# Patient Record
Sex: Female | Born: 1955 | Race: White | Hispanic: No | Marital: Married | Smoking: Never smoker
Health system: Southern US, Community
[De-identification: ages and names within clinical notes are randomized; demographics above are authoritative.]

## PROBLEM LIST (undated history)

## (undated) DIAGNOSIS — F32A Depression, unspecified: Secondary | ICD-10-CM

## (undated) DIAGNOSIS — K219 Gastro-esophageal reflux disease without esophagitis: Secondary | ICD-10-CM

## (undated) DIAGNOSIS — I10 Essential (primary) hypertension: Secondary | ICD-10-CM

## (undated) DIAGNOSIS — J189 Pneumonia, unspecified organism: Secondary | ICD-10-CM

## (undated) DIAGNOSIS — F419 Anxiety disorder, unspecified: Secondary | ICD-10-CM

## (undated) DIAGNOSIS — D649 Anemia, unspecified: Secondary | ICD-10-CM

## (undated) DIAGNOSIS — I639 Cerebral infarction, unspecified: Secondary | ICD-10-CM

## (undated) DIAGNOSIS — C801 Malignant (primary) neoplasm, unspecified: Secondary | ICD-10-CM

## (undated) DIAGNOSIS — R519 Headache, unspecified: Secondary | ICD-10-CM

## (undated) DIAGNOSIS — M1990 Unspecified osteoarthritis, unspecified site: Secondary | ICD-10-CM

## (undated) DIAGNOSIS — R0600 Dyspnea, unspecified: Secondary | ICD-10-CM

## (undated) DIAGNOSIS — I447 Left bundle-branch block, unspecified: Secondary | ICD-10-CM

## (undated) DIAGNOSIS — I209 Angina pectoris, unspecified: Secondary | ICD-10-CM

## (undated) HISTORY — PX: DILATION AND CURETTAGE OF UTERUS: SHX78

## (undated) HISTORY — PX: APPENDECTOMY: SHX54

## (undated) HISTORY — PX: CHOLECYSTECTOMY: SHX55

## (undated) HISTORY — PX: TONSILLECTOMY: SUR1361

## (undated) HISTORY — PX: OTHER SURGICAL HISTORY: SHX169

## (undated) HISTORY — PX: ABDOMINAL HYSTERECTOMY: SHX81

---

## 2015-05-09 ENCOUNTER — Other Ambulatory Visit: Payer: Self-pay

## 2015-05-10 LAB — CBC WITH DIFFERENTIAL/PLATELET
Basophils Absolute: 0 10*3/uL (ref 0.0–0.2)
Basos: 1 %
EOS (ABSOLUTE): 0.1 10*3/uL (ref 0.0–0.4)
EOS: 1 %
HEMATOCRIT: 44.6 % (ref 34.0–46.6)
HEMOGLOBIN: 14.7 g/dL (ref 11.1–15.9)
Immature Grans (Abs): 0 10*3/uL (ref 0.0–0.1)
Immature Granulocytes: 0 %
LYMPHS ABS: 2.8 10*3/uL (ref 0.7–3.1)
Lymphs: 38 %
MCH: 30.3 pg (ref 26.6–33.0)
MCHC: 33 g/dL (ref 31.5–35.7)
MCV: 92 fL (ref 79–97)
MONOCYTES: 7 %
MONOS ABS: 0.5 10*3/uL (ref 0.1–0.9)
NEUTROS ABS: 4 10*3/uL (ref 1.4–7.0)
Neutrophils: 53 %
Platelets: 193 10*3/uL (ref 150–379)
RBC: 4.85 x10E6/uL (ref 3.77–5.28)
RDW: 13.9 % (ref 12.3–15.4)
WBC: 7.4 10*3/uL (ref 3.4–10.8)

## 2015-05-10 LAB — COMPREHENSIVE METABOLIC PANEL
A/G RATIO: 1.9 (ref 1.1–2.5)
ALBUMIN: 4.2 g/dL (ref 3.5–5.5)
ALK PHOS: 69 IU/L (ref 39–117)
ALT: 20 IU/L (ref 0–32)
AST: 22 IU/L (ref 0–40)
BILIRUBIN TOTAL: 0.4 mg/dL (ref 0.0–1.2)
BUN / CREAT RATIO: 15 (ref 9–23)
BUN: 17 mg/dL (ref 6–24)
CHLORIDE: 103 mmol/L (ref 97–108)
CO2: 23 mmol/L (ref 18–29)
CREATININE: 1.13 mg/dL — AB (ref 0.57–1.00)
Calcium: 9.5 mg/dL (ref 8.7–10.2)
GFR calc Af Amer: 62 mL/min/{1.73_m2} (ref >59)
GFR calc non Af Amer: 54 mL/min/{1.73_m2} — ABNORMAL LOW (ref >59)
GLOBULIN, TOTAL: 2.2 g/dL (ref 1.5–4.5)
Glucose: 93 mg/dL (ref 65–99)
POTASSIUM: 4.6 mmol/L (ref 3.5–5.2)
SODIUM: 140 mmol/L (ref 134–144)
Total Protein: 6.4 g/dL (ref 6.0–8.5)

## 2015-05-10 LAB — TSH: TSH: 3.37 u[IU]/mL (ref 0.450–4.500)

## 2015-07-06 ENCOUNTER — Ambulatory Visit
Admission: EM | Admit: 2015-07-06 | Discharge: 2015-07-06 | Disposition: A | Discharge status: Home / Self Care | Payer: Self-pay | Attending: Family Medicine | Admitting: Family Medicine

## 2015-07-06 ENCOUNTER — Encounter: Payer: Self-pay | Admitting: Emergency Medicine

## 2015-07-06 DIAGNOSIS — J0100 Acute maxillary sinusitis, unspecified: Secondary | ICD-10-CM

## 2015-07-06 DIAGNOSIS — J40 Bronchitis, not specified as acute or chronic: Secondary | ICD-10-CM

## 2015-07-06 HISTORY — DX: Essential (primary) hypertension: I10

## 2015-07-06 MED ORDER — GUAIFENESIN-CODEINE 100-10 MG/5ML PO SOLN: 10.0000 mL | Freq: Four times a day (QID) | ORAL | Status: DC | PRN

## 2015-07-06 MED ORDER — LEVOFLOXACIN 500 MG PO TABS: 500.0000 mg | ORAL_TABLET | Freq: Every day | ORAL | Status: DC

## 2015-07-06 NOTE — ED Provider Notes (Signed)
CSN: [id number]     Arrival date & time 07/06/15  1228 History   First MD Initiated Contact with Patient 07/06/15 1436     Chief Complaint  Patient presents with  . Influenza   (Consider location/radiation/quality/duration/timing/severity/associated sxs/prior Treatment) HPI Comments: 59 yo female with a  1 month h/o sinus congestion, sinus headaches, intermittent fevers, chills, productive cough, fatigue. States her husband was diagnosed last week with pneumonia. Denies wheezing, chest pain or shortness of breath.   The history is provided by the patient.    Past Medical History  Diagnosis Date  . Hypertension    History reviewed. No pertinent past surgical history. History reviewed. No pertinent family history. Social History  Substance Use Topics  . Smoking status: Never Smoker   . Smokeless tobacco: None  . Alcohol Use: No   OB History    No data available     Review of Systems  Allergies  Penicillins  Home Medications   Prior to Admission medications   Medication Sig Start Date End Date Taking? Authorizing Provider  metoprolol succinate (TOPROL-XL) 100 MG 24 hr tablet Take 100 mg by mouth daily. Take with or immediately following a meal.   Yes Historical Provider, MD  guaiFENesin-codeine 100-10 MG/5ML syrup Take 10 mLs by mouth every 6 (six) hours as needed for cough. 07/06/15   Norval Gable, MD  levofloxacin (LEVAQUIN) 500 MG tablet Take 1 tablet (500 mg total) by mouth daily. 07/06/15   Norval Gable, MD   Meds Ordered and Administered this Visit  Medications - No data to display  BP 130/80 mmHg  Pulse 65  Temp(Src) 98.5 F (36.9 C) (Tympanic)  Resp 18  Ht 5\' 4"  (1.626 m)  Wt 185 lb (83.915 kg)  BMI 31.74 kg/m2  SpO2 96% No data found.   Physical Exam  Constitutional: She appears well-developed and well-nourished. No distress.  HENT:  Head: Normocephalic and atraumatic.  Right Ear: Tympanic membrane, external ear and ear canal normal.  Left Ear:  Tympanic membrane, external ear and ear canal normal.  Nose: Mucosal edema and rhinorrhea present. No nose lacerations, sinus tenderness, nasal deformity, septal deviation or nasal septal hematoma. No epistaxis.  No foreign bodies. Right sinus exhibits maxillary sinus tenderness and frontal sinus tenderness. Left sinus exhibits maxillary sinus tenderness and frontal sinus tenderness.  Mouth/Throat: Uvula is midline, oropharynx is clear and moist and mucous membranes are normal. No oropharyngeal exudate.  Eyes: Conjunctivae and EOM are normal. Pupils are equal, round, and reactive to light. Right eye exhibits no discharge. Left eye exhibits no discharge. No scleral icterus.  Neck: Normal range of motion. Neck supple. No thyromegaly present.  Cardiovascular: Normal rate, regular rhythm and normal heart sounds.   Pulmonary/Chest: Effort normal. No respiratory distress. She has no wheezes. She has rales (right base).  Lymphadenopathy:    She has no cervical adenopathy.  Skin: No rash noted. She is not diaphoretic.  Nursing note and vitals reviewed.   ED Course  Procedures (including critical care time)  Labs Review Labs Reviewed - No data to display  Imaging Review No results found.   Visual Acuity Review  Right Eye Distance:   Left Eye Distance:   Bilateral Distance:    Right Eye Near:   Left Eye Near:    Bilateral Near:         MDM   1. Acute maxillary sinusitis, recurrence not specified   2. Bronchitis   (vs possible early/mild pneumonia ?)  Discharge Medication List  as of 07/06/2015  2:47 PM    START taking these medications   Details  guaiFENesin-codeine 100-10 MG/5ML syrup Take 10 mLs by mouth every 6 (six) hours as needed for cough., Starting 07/06/2015, Until Discontinued, Print    levofloxacin (LEVAQUIN) 500 MG tablet Take 1 tablet (500 mg total) by mouth daily., Starting 07/06/2015, Until Discontinued, Normal      1. diagnosis reviewed with  patient/parent/guardian/family 2. rx as per orders above; reviewed possible side effects, interactions, risks and benefits  3. Recommend supportive treatment with increased fluids 4. Follow-up  prn if symptoms worsen or don't improve    Norval Gable, MD 07/06/15 1507

## 2015-07-06 NOTE — ED Notes (Signed)
Pt with flu like s/s x 1 month

## 2019-08-28 DIAGNOSIS — U071 COVID-19: Secondary | ICD-10-CM

## 2019-08-28 HISTORY — DX: COVID-19: U07.1

## 2020-01-06 ENCOUNTER — Ambulatory Visit
Admission: RE | Admit: 2020-01-06 | Discharge: 2020-01-06 | Disposition: A | Discharge status: Home / Self Care | Payer: Self-pay | Source: Ambulatory Visit | Attending: Family Medicine | Admitting: Family Medicine

## 2020-01-06 ENCOUNTER — Other Ambulatory Visit: Payer: Self-pay | Admitting: Family Medicine

## 2020-01-06 ENCOUNTER — Other Ambulatory Visit: Payer: Self-pay

## 2020-01-06 DIAGNOSIS — R221 Localized swelling, mass and lump, neck: Secondary | ICD-10-CM

## 2020-01-06 LAB — POCT I-STAT CREATININE: Creatinine, Ser: 1.2 mg/dL — ABNORMAL HIGH (ref 0.44–1.00)

## 2020-01-06 IMAGING — CT CT NECK W/ CM
4 of 5 series · 14 of 33 positions shown, 16 images · IV contrast (omnipaque)
Comparison: Report from head CT [DATE] (images unavailable).

CLINICAL DATA: Complaint of right-sided neck mass known for 2
weeks, patient denies any pain or other symptoms, denies fever.

EXAM:
CT NECK WITH CONTRAST
TECHNIQUE: Multidetector CT imaging of the neck was performed using the
standard protocol following the bolus administration of intravenous
contrast.
CONTRAST:  75mL OMNIPAQUE IOHEXOL 300 MG/ML  SOLN

[Series 2: axial neck neck (person_name) 2.00 · axial · 0.44mm/px · z∈[-699,-595]mm · 3 of 106 slices shown]
[im 27/106  bone]
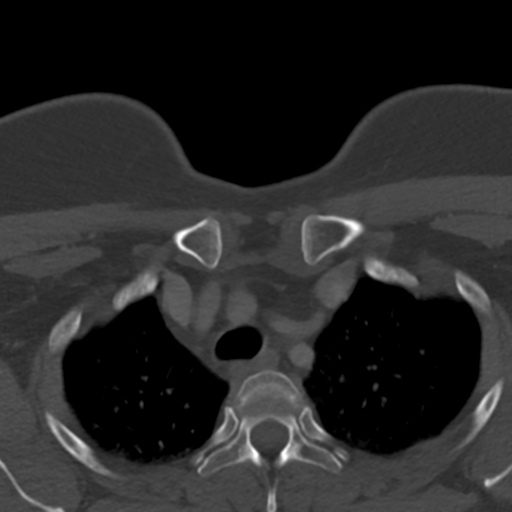
[im 53/106  bone]
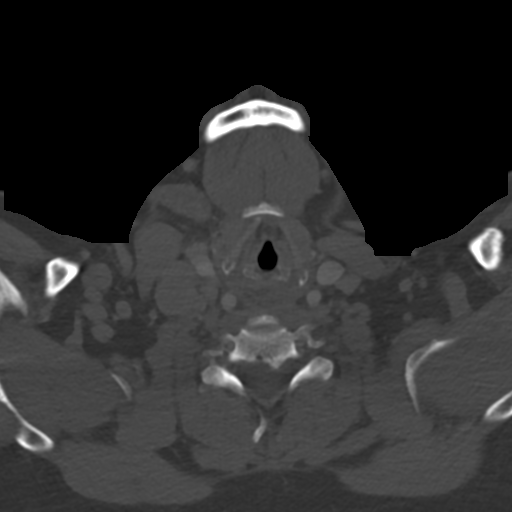
[im 79/106  bone]
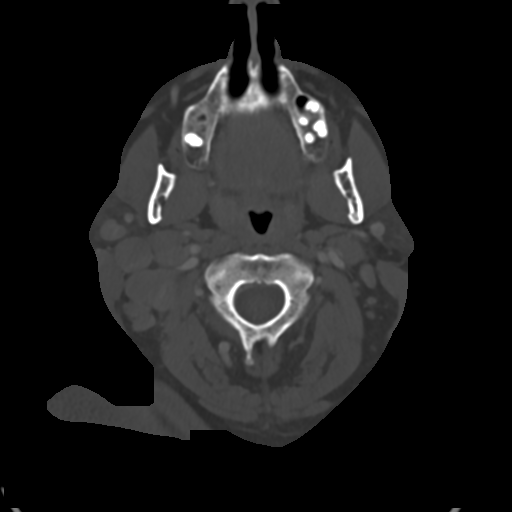

[Series 4: coronal neck neck (person_name) 2.00 cor · coronal · 0.38mm/px · 3 of 112 slices shown]
[im 23/112  bone]
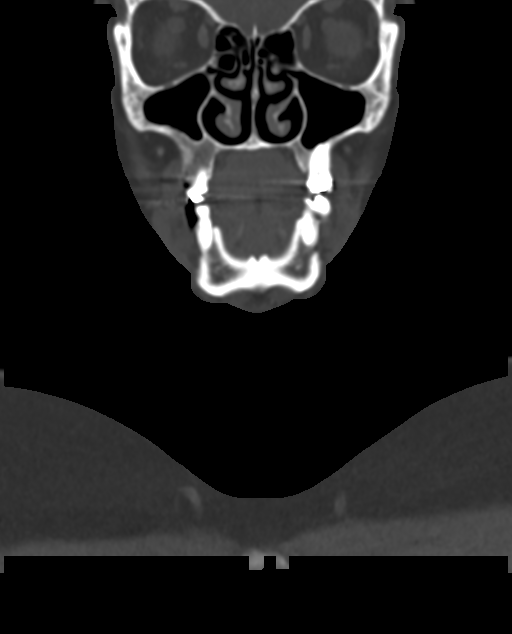
[im 45/112  bone]
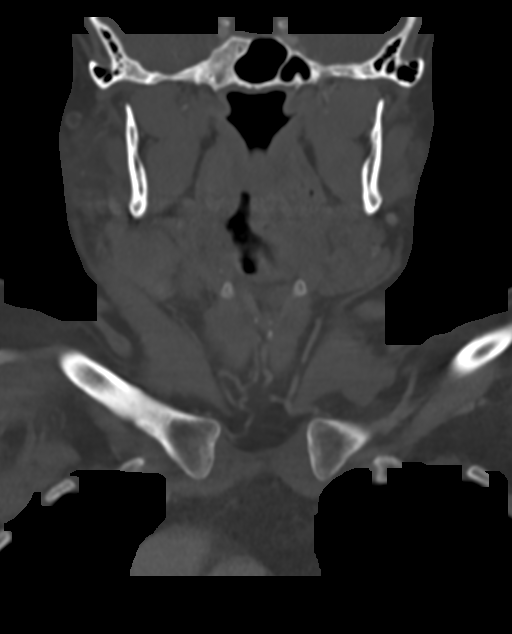
[im 67/112  bone]
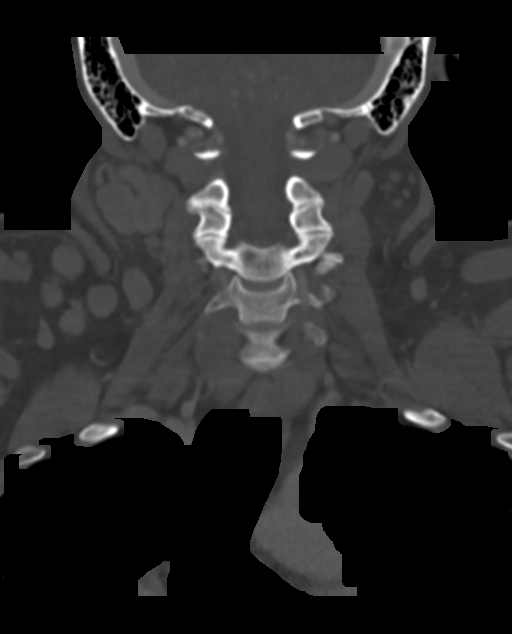

[Series 6: sagittal neck neck (person_name) 2.00 sag · sagittal · 0.44mm/px · 5 of 96 slices shown, 6 images]
[im 32/96  bone]
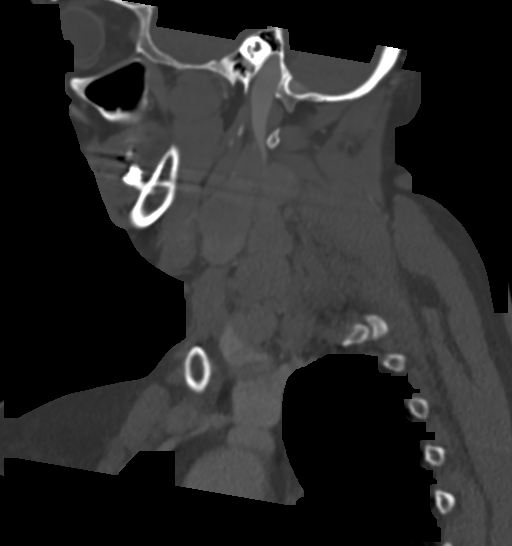
[im 40/96  bone]
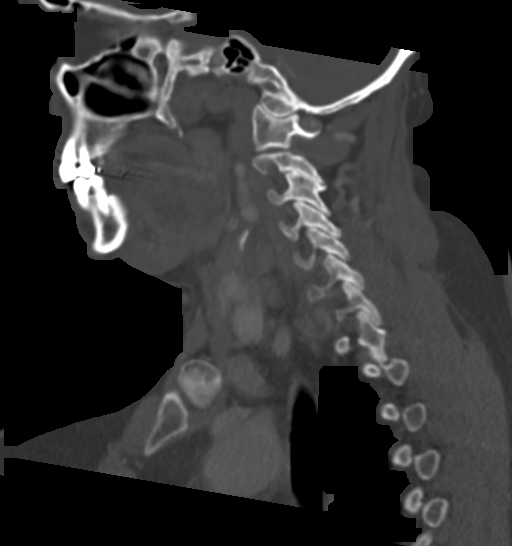
[im 48/96  soft-tissue]
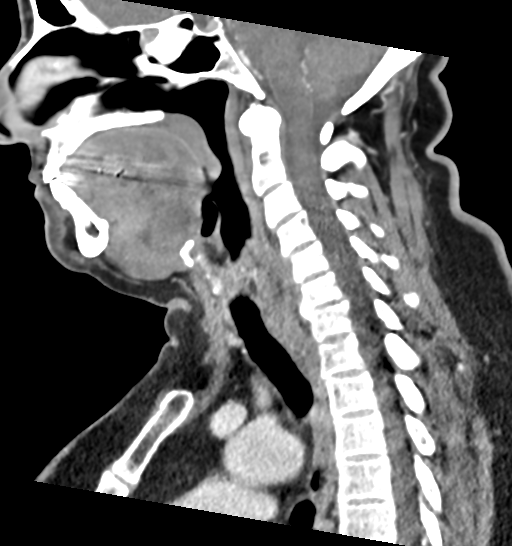
[im 48/96  bone]
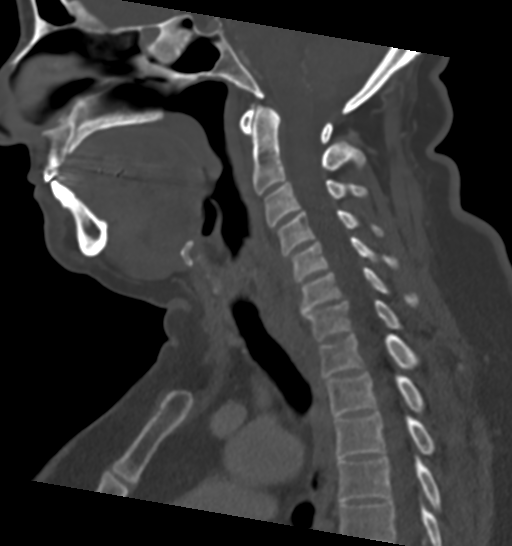
[im 56/96  bone]
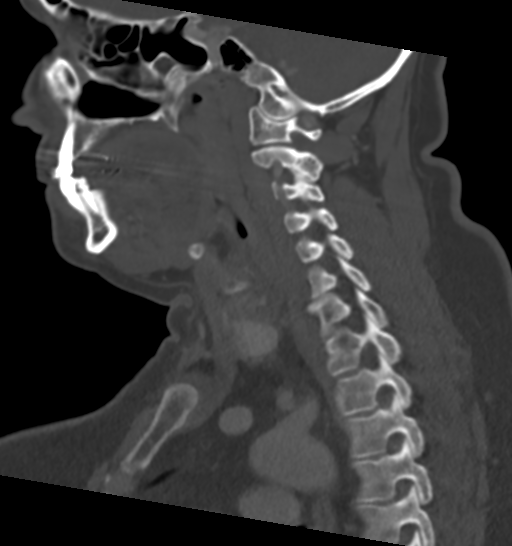
[im 64/96  bone]
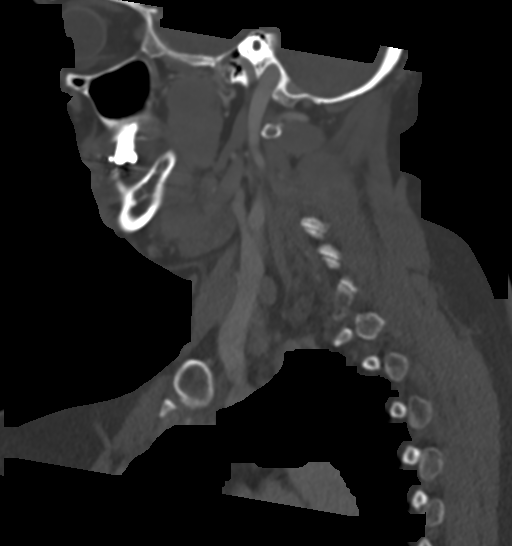

[Series 8: ax oropharynx neck neck (person_name) 2.00 ax · axial · 0.38mm/px · z∈[-724,-608]mm · 3 of 119 slices shown, 4 images]
[im 30/119  soft-tissue]
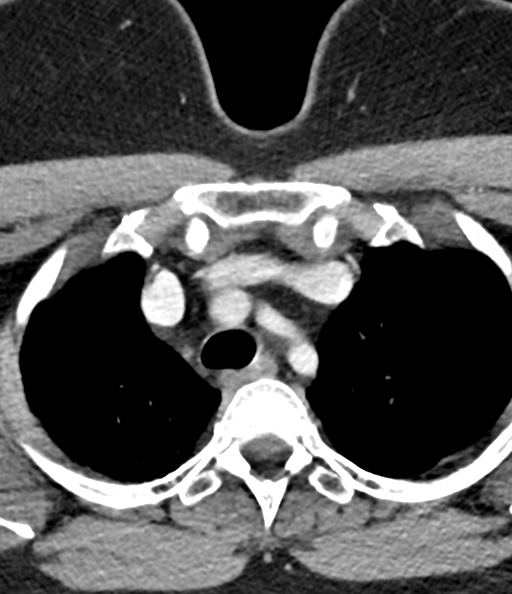
[im 30/119  bone]
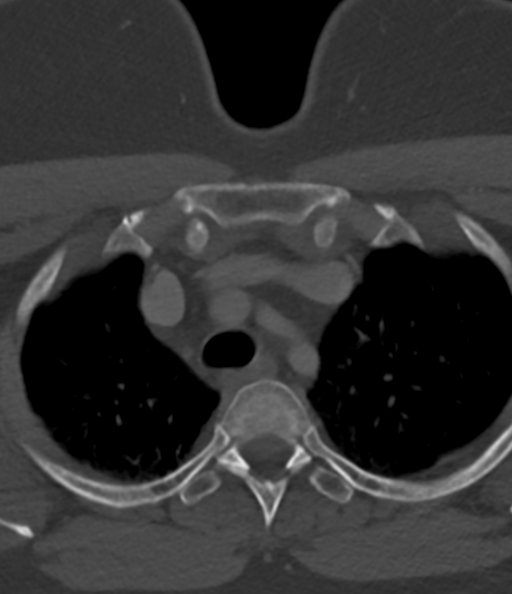
[im 60/119  bone]
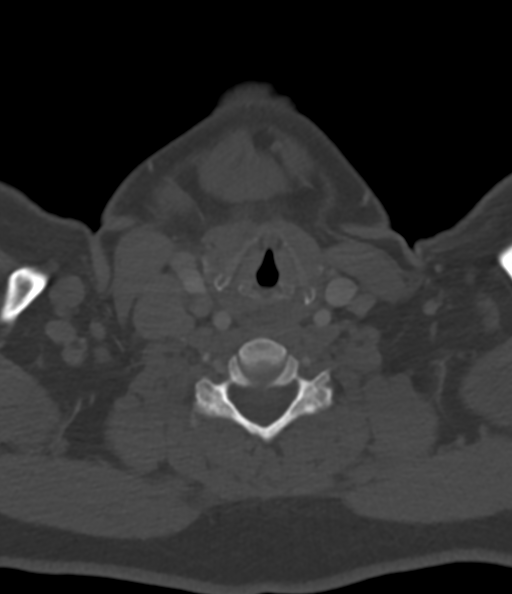
[im 89/119  bone]
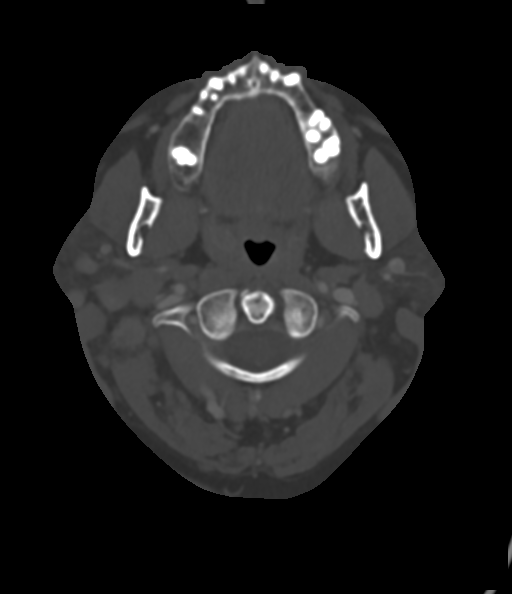

[14 of 33 positions shown; findings below may reference images not displayed]

FINDINGS: Pharynx and larynx: No discrete mass is identified within the oral
cavity, pharynx or larynx.

Salivary glands: No inflammation, mass, or stone. The right
submandibular gland is anteriorly displaced by left level II
lymphadenopathy.

Thyroid: Unremarkable.

Lymph nodes: There is prominent bilateral cervical chain
lymphadenopathy at essentially all nodal stations. Most notably,
there is bulky matted lymphadenopathy at the right level II and III
stations. An index right level II lymph node measures 3.8 x 2.5 cm
in transaxial dimensions (series 6, image 31) (series 2, image 41).

Vascular: The internal jugular veins are partially effaced by
prominent bilateral cervical lymphadenopathy.

Limited intracranial: Partially empty sella turcica. Otherwise
unremarkable.

Visualized orbits: Visualized orbits show no acute finding.

Mastoids and visualized paranasal sinuses: Complete opacification of
the right sphenoid sinus. No significant mastoid effusion.

Skeleton: No acute bony abnormality or aggressive osseous lesion.
Cervical spondylosis. Most notably at C6-C7 there is
moderate/advanced disc space narrowing with a posterior disc
osteophyte complex and uncovertebral hypertrophy.

Upper chest: No consolidation within the imaged lung apices.

These results will be called to the ordering clinician or
representative by the Radiologist Assistant, and communication
documented in the PACS or [REDACTED].
IMPRESSION: Prominent bilateral cervical chain lymphadenopathy at essentially
all nodal stations. Most notably, there is bulky matted
lymphadenopathy at the right level II and III stations. An index
right level II lymph node measures 3.8 x 2.5 cm in transaxial
dimensions. Findings are highly suspicious for a lymphoproliferative
process such as lymphoma or nodal metastatic disease. Consider CT
imaging of the chest/abdomen/pelvis and/or PET-CT for further
evaluation. Additionally, consider direct tissue sampling.

No primary soft tissue neck mass is identified.

Right sphenoid sinusitis.

## 2020-01-06 MED ORDER — IOHEXOL 300 MG/ML  SOLN
75.0000 mL | Freq: Once | INTRAMUSCULAR | Status: AC | PRN
  Administered 2020-01-06: 75 mL via INTRAVENOUS

## 2020-01-08 ENCOUNTER — Other Ambulatory Visit: Payer: Self-pay

## 2020-01-11 ENCOUNTER — Encounter: Payer: Self-pay | Admitting: Internal Medicine

## 2020-01-11 ENCOUNTER — Inpatient Hospital Stay: Payer: Self-pay | Attending: Internal Medicine | Admitting: Internal Medicine

## 2020-01-11 ENCOUNTER — Inpatient Hospital Stay: Payer: Self-pay

## 2020-01-11 ENCOUNTER — Other Ambulatory Visit: Payer: Self-pay

## 2020-01-11 DIAGNOSIS — R748 Abnormal levels of other serum enzymes: Secondary | ICD-10-CM | POA: Insufficient documentation

## 2020-01-11 DIAGNOSIS — R599 Enlarged lymph nodes, unspecified: Secondary | ICD-10-CM | POA: Insufficient documentation

## 2020-01-11 DIAGNOSIS — M2578 Osteophyte, vertebrae: Secondary | ICD-10-CM | POA: Insufficient documentation

## 2020-01-11 DIAGNOSIS — R590 Localized enlarged lymph nodes: Secondary | ICD-10-CM | POA: Insufficient documentation

## 2020-01-11 DIAGNOSIS — R5383 Other fatigue: Secondary | ICD-10-CM | POA: Insufficient documentation

## 2020-01-11 DIAGNOSIS — R221 Localized swelling, mass and lump, neck: Secondary | ICD-10-CM | POA: Insufficient documentation

## 2020-01-11 DIAGNOSIS — R591 Generalized enlarged lymph nodes: Secondary | ICD-10-CM

## 2020-01-11 DIAGNOSIS — I10 Essential (primary) hypertension: Secondary | ICD-10-CM | POA: Insufficient documentation

## 2020-01-11 LAB — HEPATITIS B SURFACE ANTIGEN: Hepatitis B Surface Ag: NONREACTIVE

## 2020-01-11 LAB — LACTATE DEHYDROGENASE: LDH: 177 U/L (ref 98–192)

## 2020-01-11 LAB — HIV ANTIBODY (ROUTINE TESTING W REFLEX): HIV Screen 4th Generation wRfx: NONREACTIVE

## 2020-01-11 LAB — HEPATITIS C ANTIBODY: HCV Ab: NONREACTIVE

## 2020-01-11 NOTE — Assessment & Plan Note (Addendum)
#  Bilateral neck adenopathy right more than left; highly concerning for lymphoproliferative disorder.  Recent CBC within normal limits CMP normal except for mildly elevated alkaline phosphatase. [PCP].  #Recommend PET scan ASAP for further evaluation.  Also discussed the need for a biopsy-core biopsy versus excisional lymph node biopsy [through surgery]-once the PET scan is available.  #Recommend checking LDH; beta-2 microglobulin; hepatitis B surface antigen; HIV hepatitis C work-up.  Thank you, Ms.Fields NP for allowing me to participate in the care of your pleasant patient. Please do not hesitate to contact me with questions or concerns in the interim.  # DISPOSITION:  #Labs today #PET scan asap # follow up TBD-DrB  # I reviewed the blood work- with the patient in detail; also reviewed the imaging independently [as summarized above]; and with the patient in detail.

## 2020-01-11 NOTE — Progress Notes (Signed)
[facility] NOTE  Patient Care Team: Peggye Form, NP as PCP - General (Family Medicine)  CHIEF COMPLAINTS/PURPOSE OF CONSULTATION:   # MAY 2021- Bilateral neck adenopathy right more than left; highly concerning for lymphoproliferative disorder.MAY 2021-CBC/CMP normal /  mildly elevated alkaline phosphatase. [PCP; [facility]]. Oncology History   No history exists.     HISTORY OF PRESENTING ILLNESS:  Danielle Lewis 64 y.o.  female no signal past medical history has been referred to [place] for further work-up regarding neck adenopathy.  Patient noted to have swelling of the right neck approximately 2 weeks ago.  Denies any worsening swelling since then.  No night sweats.  No weight loss.  No fevers.  Denies any pain.  Mild to moderate fatigue.   Review of Systems  Constitutional: Positive for malaise/fatigue. Negative for chills, diaphoresis, fever and weight loss.  HENT: Negative for nosebleeds and sore throat.   Eyes: Negative for double vision.  Respiratory: Negative for cough, hemoptysis, sputum production, shortness of breath and wheezing.   Cardiovascular: Negative for chest pain, palpitations, orthopnea and leg swelling.  Gastrointestinal: Negative for abdominal pain, blood in stool, constipation, diarrhea, heartburn, melena, nausea and vomiting.  Genitourinary: Negative for dysuria, frequency and urgency.  Musculoskeletal: Negative for back pain and joint pain.  Skin: Negative.  Negative for itching and rash.  Neurological: Negative for dizziness, tingling, focal weakness, weakness and headaches.  Endo/Heme/Allergies: Does not bruise/bleed easily.  Psychiatric/Behavioral: Negative for depression. The patient is not nervous/anxious and does not have insomnia.      MEDICAL HISTORY:  Past Medical History:  Diagnosis Date  . Hypertension     SURGICAL HISTORY: History reviewed. No pertinent surgical history.  SOCIAL HISTORY: Social History   Socioeconomic  History  . Marital status: Legally Separated    Spouse name: Not on file  . Number of children: Not on file  . Years of education: Not on file  . Highest education level: Not on file  Occupational History  . Not on file  Tobacco Use  . Smoking status: Never Smoker  . Smokeless tobacco: Never Used  Substance and Sexual Activity  . Alcohol use: No  . Drug use: Not on file  . Sexual activity: Not on file  Other Topics Concern  . Not on file  Social History Narrative   Lives in [place]; [place]. Never smoked; wine rarely. Used in work in [employer]. With husband.    Social Determinants of Health   Financial Resource Strain:   . Difficulty of Paying Living Expenses:   Food Insecurity:   . Worried About Charity fundraiser in the Last Year:   . Arboriculturist in the Last Year:   Transportation Needs:   . Film/video editor (Medical):   Marland Kitchen Lack of Transportation (Non-Medical):   Physical Activity:   . Days of Exercise per Week:   . Minutes of Exercise per Session:   Stress:   . Feeling of Stress :   Social Connections:   . Frequency of Communication with Friends and Family:   . Frequency of Social Gatherings with Friends and Family:   . Attends Religious Services:   . Active Member of Clubs or Organizations:   . Attends Archivist Meetings:   Marland Kitchen Marital Status:   Intimate Partner Violence:   . Fear of Current or Ex-Partner:   . Emotionally Abused:   Marland Kitchen Physically Abused:   . Sexually Abused:  FAMILY HISTORY: Family History  Problem Relation Age of Onset  . Cancer Mother        unknown type    ALLERGIES:  is allergic to penicillins.  MEDICATIONS:  Current Outpatient Medications  Medication Sig Dispense Refill  . cetirizine (ZYRTEC) 10 MG tablet Take 1 tablet by mouth daily.     No current facility-administered medications for this visit.      Marland Kitchen  PHYSICAL EXAMINATION: ECOG PERFORMANCE STATUS: 0 - Asymptomatic  Vitals:   01/11/20 1117   BP: (!) 157/90  Pulse: 94  Temp: (!) 96.5 F (35.8 C)  SpO2: 100%   Filed Weights   01/11/20 1117  Weight: 191 lb 12.8 oz (87 kg)    Physical Exam  Constitutional: She is oriented to person, place, and time and well-developed, well-nourished, and in no distress.  HENT:  Head: Normocephalic and atraumatic.  Mouth/Throat: Oropharynx is clear and moist. No oropharyngeal exudate.  Eyes: Pupils are equal, round, and reactive to light.  Neck:  4-5 cm bulky lymph nodes noted on the right cervical chain; mild adenopathy noted on the left side.  Cardiovascular: Normal rate and regular rhythm.  Pulmonary/Chest: Effort normal and breath sounds normal. No respiratory distress. She has no wheezes.  Abdominal: Soft. Bowel sounds are normal. She exhibits no distension and no mass. There is no abdominal tenderness. There is no rebound and no guarding.  Musculoskeletal:        General: No tenderness or edema. Normal range of motion.     Cervical back: Normal range of motion and neck supple.  Neurological: She is alert and oriented to person, place, and time.  Skin: Skin is warm.  Psychiatric: Affect normal.     LABORATORY DATA:  I have reviewed the data as listed Lab Results  Component Value Date   WBC 7.4 05/09/2015   HGB 14.7 05/09/2015   HCT 44.6 05/09/2015   MCV 92 05/09/2015   PLT 193 05/09/2015   Recent Labs    01/06/20 1506  CREATININE 1.20*    RADIOGRAPHIC STUDIES: I have personally reviewed the radiological images as listed and agreed with the findings in the report. CT SOFT TISSUE NECK W CONTRAST  Result Date: 01/06/2020 CLINICAL DATA:  Complaint of right-sided neck mass known for 2 weeks, patient denies any pain or other symptoms, denies fever. EXAM: CT NECK WITH CONTRAST TECHNIQUE: Multidetector CT imaging of the neck was performed using the standard protocol following the bolus administration of intravenous contrast. CONTRAST:  39mL OMNIPAQUE IOHEXOL 300 MG/ML  SOLN  COMPARISON:  Report from head CT 03/24/2003 (images unavailable). FINDINGS: Pharynx and larynx: No discrete mass is identified within the oral cavity, pharynx or larynx. Salivary glands: No inflammation, mass, or stone. The right submandibular gland is anteriorly displaced by left level II lymphadenopathy. Thyroid: Unremarkable. Lymph nodes: There is prominent bilateral cervical chain lymphadenopathy at essentially all nodal stations. Most notably, there is bulky matted lymphadenopathy at the right level II and III stations. An index right level II lymph node measures 3.8 x 2.5 cm in transaxial dimensions (series 6, image 31) (series 2, image 41). Vascular: The internal jugular veins are partially effaced by prominent bilateral cervical lymphadenopathy. Limited intracranial: Partially empty sella turcica. Otherwise unremarkable. Visualized orbits: Visualized orbits show no acute finding. Mastoids and visualized paranasal sinuses: Complete opacification of the right sphenoid sinus. No significant mastoid effusion. Skeleton: No acute bony abnormality or aggressive osseous lesion. Cervical spondylosis. Most notably at C6-C7 there is moderate/advanced disc space  narrowing with a posterior disc osteophyte complex and uncovertebral hypertrophy. Upper chest: No consolidation within the imaged lung apices. These results will be called to the ordering clinician or representative by the Radiologist Assistant, and communication documented in the PACS or [employer]. IMPRESSION: Prominent bilateral cervical chain lymphadenopathy at essentially all nodal stations. Most notably, there is bulky matted lymphadenopathy at the right level II and III stations. An index right level II lymph node measures 3.8 x 2.5 cm in transaxial dimensions. Findings are highly suspicious for a lymphoproliferative process such as lymphoma or nodal metastatic disease. Consider CT imaging of the chest/abdomen/pelvis and/or PET-CT for further  evaluation. Additionally, consider direct tissue sampling. No primary soft tissue neck mass is identified. Right sphenoid sinusitis. Electronically Signed   By: Kellie Simmering DO   On: 01/06/2020 15:34    ASSESSMENT & PLAN:   Generalized enlarged lymph nodes #Bilateral neck adenopathy right more than left; highly concerning for lymphoproliferative disorder.  Recent CBC within normal limits CMP normal except for mildly elevated alkaline phosphatase. [PCP].  #Recommend PET scan ASAP for further evaluation.  Also discussed the need for a biopsy-core biopsy versus excisional lymph node biopsy [through surgery]-once the PET scan is available.  #Recommend checking LDH; beta-2 microglobulin; hepatitis B surface antigen; HIV hepatitis C work-up.  Thank you, Ms.Fields NP for allowing me to participate in the care of your pleasant patient. Please do not hesitate to contact me with questions or concerns in the interim.  # DISPOSITION:  #Labs today #PET scan asap # follow up TBD-DrB  # I reviewed the blood work- with the patient in detail; also reviewed the imaging independently [as summarized above]; and with the patient in detail.    All questions were answered. The patient knows to call the clinic with any problems, questions or concerns.    Cammie Sickle, MD 01/11/2020 12:44 PM

## 2020-01-12 ENCOUNTER — Other Ambulatory Visit: Payer: Self-pay

## 2020-01-12 ENCOUNTER — Ambulatory Visit
Admission: RE | Admit: 2020-01-12 | Discharge: 2020-01-12 | Disposition: A | Discharge status: Home / Self Care | Payer: Self-pay | Source: Ambulatory Visit | Attending: Internal Medicine | Admitting: Internal Medicine

## 2020-01-12 DIAGNOSIS — Z9049 Acquired absence of other specified parts of digestive tract: Secondary | ICD-10-CM | POA: Insufficient documentation

## 2020-01-12 DIAGNOSIS — K760 Fatty (change of) liver, not elsewhere classified: Secondary | ICD-10-CM | POA: Insufficient documentation

## 2020-01-12 DIAGNOSIS — R590 Localized enlarged lymph nodes: Secondary | ICD-10-CM | POA: Insufficient documentation

## 2020-01-12 DIAGNOSIS — Z90710 Acquired absence of both cervix and uterus: Secondary | ICD-10-CM | POA: Insufficient documentation

## 2020-01-12 DIAGNOSIS — R591 Generalized enlarged lymph nodes: Secondary | ICD-10-CM

## 2020-01-12 DIAGNOSIS — K5730 Diverticulosis of large intestine without perforation or abscess without bleeding: Secondary | ICD-10-CM | POA: Insufficient documentation

## 2020-01-12 LAB — GLUCOSE, CAPILLARY: Glucose-Capillary: 89 mg/dL (ref 70–99)

## 2020-01-12 IMAGING — CT NM PET TUM IMG INITIAL (PI) SKULL BASE T - THIGH
9 of 10 series · 19 of 25 positions shown · non-contrast
Comparison: [DATE] neck CT.

CLINICAL DATA: Initial treatment strategy for bilateral neck
lymphadenopathy.

EXAM:
NUCLEAR MEDICINE PET SKULL BASE TO THIGH
TECHNIQUE: 10.4 mCi F-18 FDG was injected intravenously. Full-ring PET imaging
was performed from the skull base to thigh after the radiotracer. CT
data was obtained and used for attenuation correction and anatomic
localization.
Fasting blood glucose: 89 mg/dl

[Series 4: ct wb 5.0 b30f · axial · 5.0mm · 0.98mm/px · z∈[-940,-508]mm · 2 of 290 slices shown]
[im 1/290]
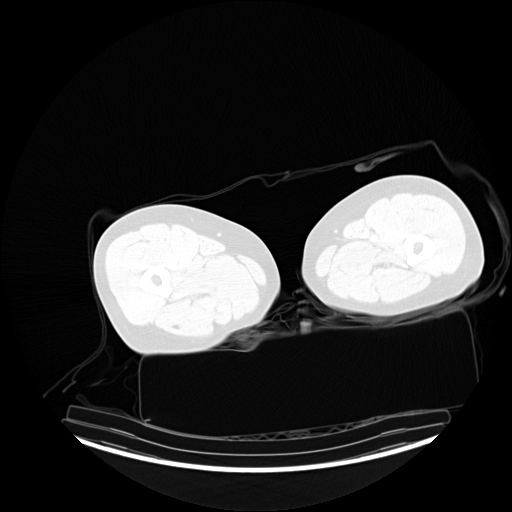
[im 145/290]
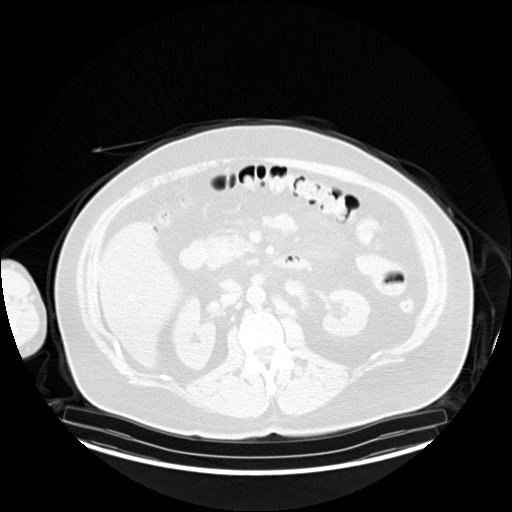

[Series 5: pet wb uncorrected (nac) · axial · 5.0mm · 4.07mm/px · z∈[-940,-72]mm · 3 of 290 slices shown]
[im 1/290]
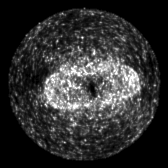
[im 145/290]
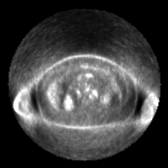
[im 290/290]
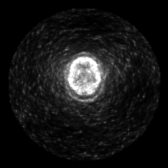

[Series 6: pet wb (ac) · axial · 5.0mm · 3.13mm/px · z∈[-652,-72]mm · 3 of 290 slices shown]
[im 97/290]
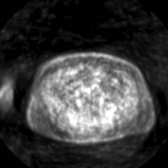
[im 193/290]
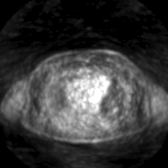
[im 290/290]
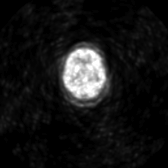

[Series 603: pet_ct axial fused · 3 of 284 slices shown]
[im 95/284]
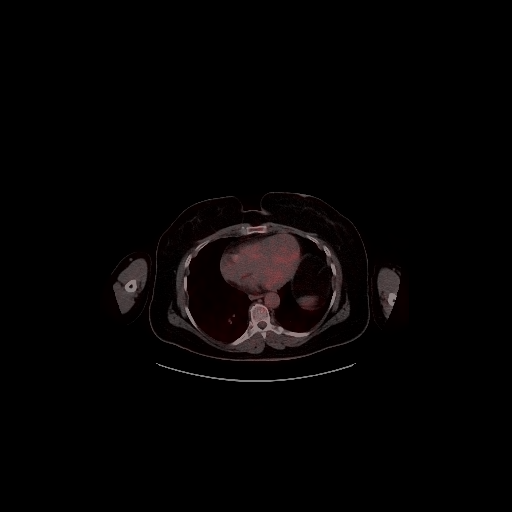
[im 189/284]
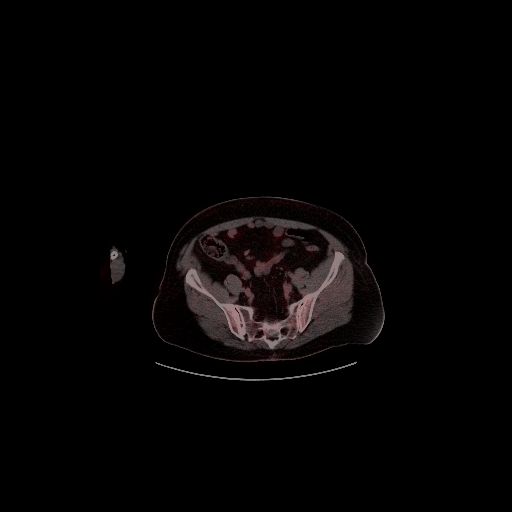
[im 284/284]
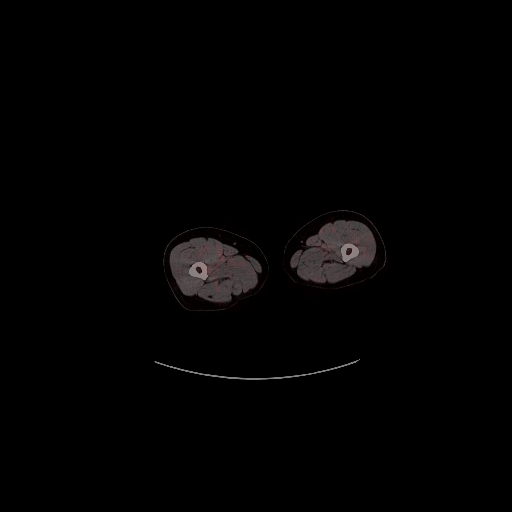

[Series 605: pet_ct sagittal fused · 2 of 169 slices shown]
[im 1/169]
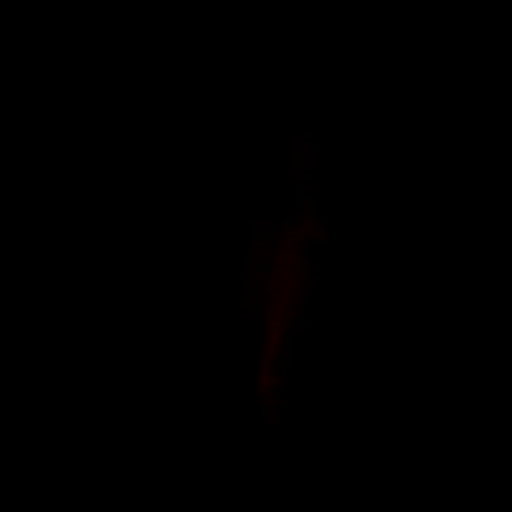
[im 169/169]
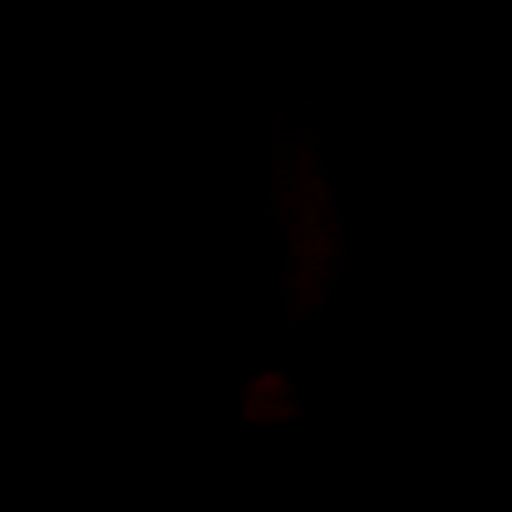

[Series 606: pet axial · 3 of 289 slices shown]
[im 1/289]
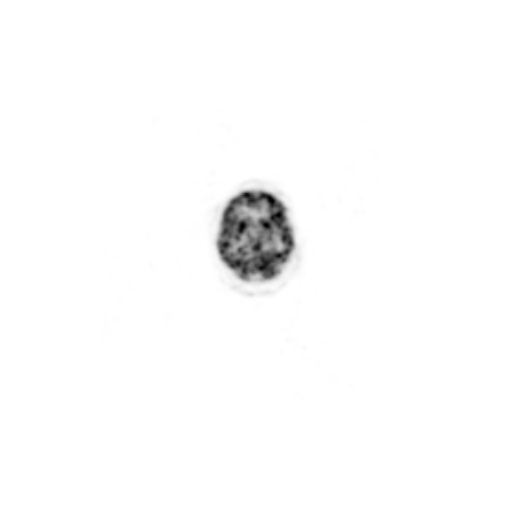
[im 193/289]
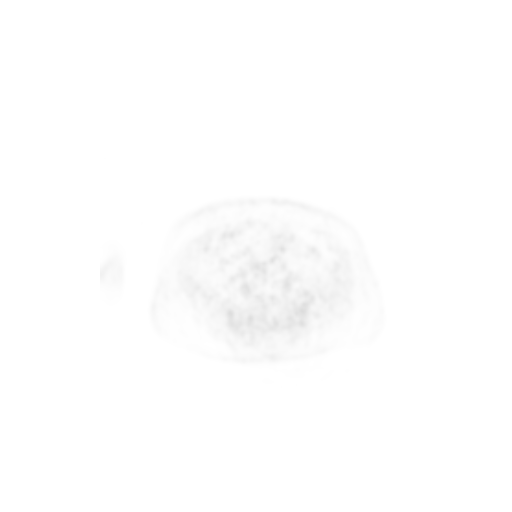
[im 289/289]
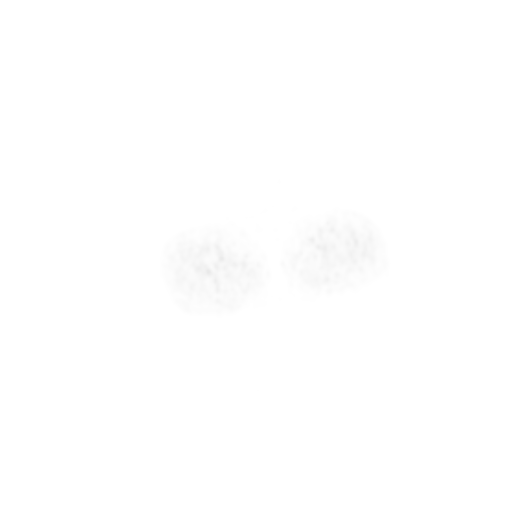

[Series 607: pet coronal · 1 of 115 slices shown]
[im 1/115]
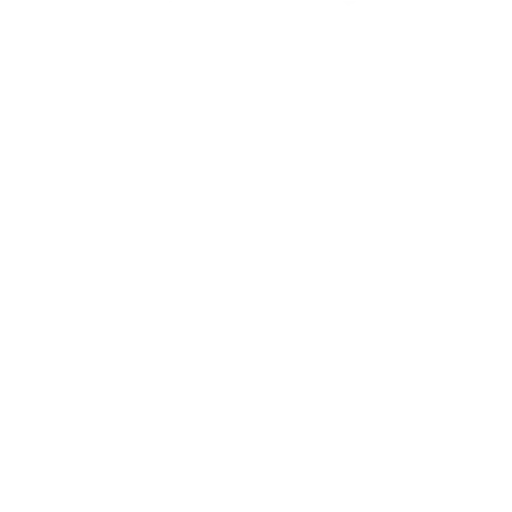

[Series 608: pet sagittal · 1 of 176 slices shown]
[im 176/176]
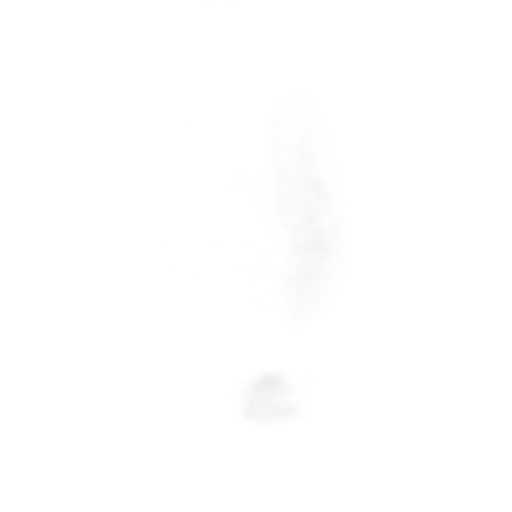

[Series 1062: results mm oncology reading · 1.0mm · 0.50mm/px · 1 of 15 slices shown]
[im 1/15]
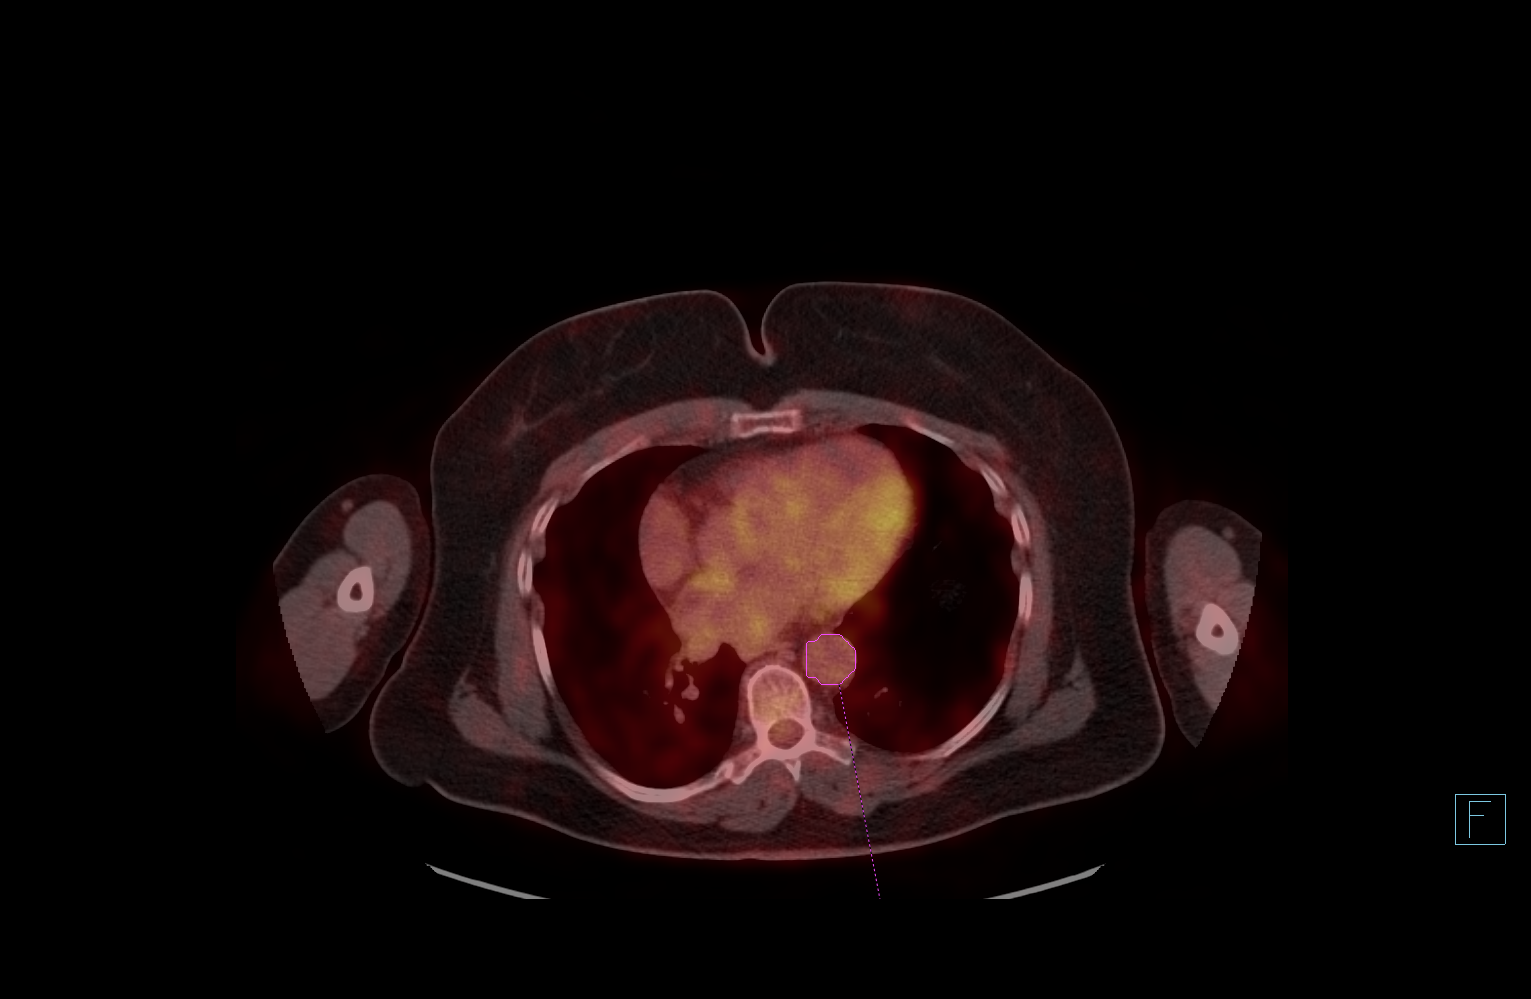

[19 of 25 positions shown; findings below may reference images not displayed]

FINDINGS: Mediastinal blood pool activity: SUV max

Liver activity: SUV max

NECK: Extensive bilateral hypermetabolic lymphadenopathy in the neck
involving levels II through V neck node chains bilaterally.

Representative 2.6 cm right level 2 neck node with max SUV
(series 4/image 36). Representative 1.2 cm right level 3 neck node
with max SUV 13.0 (series 4/image 46). Representative 1.5 cm right
level 5 neck node with max SUV 14.9 (series 4/image 36).

Representative 1.2 cm left level 2 neck node with max SUV
(series 4/image 32). Representative 1.1 cm left level 4 neck no with
max SUV 7.0 (series 4/image 54). Representative 0.9 cm left level 5
neck node with max SUV 4.1 (series 4/image 35).

Incidental CT findings: none

CHEST: No enlarged or hypermetabolic axillary, mediastinal or hilar
lymph nodes. No hypermetabolic pulmonary findings.

Incidental CT findings: No acute consolidative airspace disease,
lung masses or significant pulmonary nodules.

ABDOMEN/PELVIS:

Widespread hypermetabolic lymphadenopathy in the retroperitoneum,
mesentery and bilateral pelvis.

Representative 1.5 cm short axis diameter mesenteric node with max
SUV 8.6 (series 4/image 148).

Representative 2.4 cm aortocaval node with max SUV 11.1 (series
4/image 161).

Representative 2.2 cm left para-aortic node with max SUV
(series 4/image 157).

Representative 1.8 cm left common iliac node with max SUV
(series 4/image 177).

Representative 1.3 cm right external iliac node with max SUV
(series 4/image 215).

Representative 1.2 cm right inguinal node with max SUV 5.8 (series
4/image 220).

No abnormal hypermetabolic activity within the liver, pancreas,
adrenal glands, or spleen. Normal size spleen.

Incidental CT findings: Diffuse hepatic steatosis. Cholecystectomy.
Moderate sigmoid diverticulosis. Hysterectomy. Simple right adnexal
2.4 cm cyst.

SKELETON: No focal hypermetabolic activity to suggest skeletal
metastasis.

Incidental CT findings: none
IMPRESSION: 1. Widespread hypermetabolic lymphadenopathy in right greater than
left neck, retroperitoneum, mesentery and bilateral pelvis, most
compatible with lymphoma. [HOSPITAL] score 5.
2. Normal size non hypermetabolic spleen. No evidence of
hypermetabolic lymphoma in the chest.
3. Diffuse hepatic steatosis.
4. Moderate sigmoid diverticulosis.

## 2020-01-12 MED ORDER — FLUDEOXYGLUCOSE F - 18 (FDG) INJECTION
9.9000 | Freq: Once | INTRAVENOUS | Status: AC | PRN
  Administered 2020-01-12: 10.38 via INTRAVENOUS

## 2020-01-13 ENCOUNTER — Other Ambulatory Visit: Payer: Self-pay | Admitting: *Deleted

## 2020-01-13 ENCOUNTER — Telehealth: Payer: Self-pay | Admitting: Internal Medicine

## 2020-01-13 DIAGNOSIS — R591 Generalized enlarged lymph nodes: Secondary | ICD-10-CM

## 2020-01-13 LAB — BETA 2 MICROGLOBULIN, SERUM: Beta-2 Microglobulin: 1.7 mg/L (ref 0.6–2.4)

## 2020-01-13 NOTE — Telephone Encounter (Signed)
Spoke to patient regarding results of the PET scan; will need excisional biopsy.  Discussed with the patient; in agreement  Discussed with Dr. Peyton Najjar; kindly agrees to review the images.  We will make a referral if biopsy is feasible.

## 2020-01-13 NOTE — Telephone Encounter (Signed)
Per Dr. Jacinto Reap - Dr. Peyton Najjar agree to evaluate the patient. Referral initiated

## 2020-01-13 NOTE — Telephone Encounter (Signed)
Heather- please inform pt that we made the referral to [facility]; and to call us back if they have not heard from their office soon. GB

## 2020-01-13 NOTE — Telephone Encounter (Signed)
Left voice message for patient to return my phone call.

## 2020-01-14 ENCOUNTER — Ambulatory Visit: Payer: Self-pay | Admitting: General Surgery

## 2020-01-15 ENCOUNTER — Ambulatory Visit: Payer: Self-pay | Admitting: General Surgery

## 2020-01-15 NOTE — H&P (Signed)
PATIENT PROFILE: Danielle Lewis is a 64 y.o. female who presents to the Clinic for consultation at the request of Dr. Brahmanday for evaluation of cervical lymph node biopsy.  PCP:  Fields, Glenda Lynn, NP  HISTORY OF PRESENT ILLNESS: [name] reports that her family noticed that she has increased lumps on her neck.  They have been noted for the last few weeks.  She denies any pain.  There is no pain radiation.  There is no alleviating or aggravating factor.  Patient denies any night sweats.  Denies any weight loss.  Denies any fevers.  Patient went to see her primary care physician who ordered a CT scan of the neck.  This shows the enlarged lymph nodes mostly on the right neck.  Patient was evaluated by oncology and a PET scan was ordered showing lymphadenopathy on the neck and groin area.  I personally evaluated the images of the CT scan and the PET scan.   PROBLEM LIST:     Problem List  Date Reviewed: 01/07/2020   None      GENERAL REVIEW OF SYSTEMS:   General ROS: negative for - chills, fatigue, fever, weight gain or weight loss Allergy and Immunology ROS: negative for - hives  Hematological and Lymphatic ROS: negative for - bleeding problems or bruising, positive for palpable nodes Endocrine ROS: negative for - heat or cold intolerance, hair changes Respiratory ROS: negative for - cough, shortness of breath or wheezing Cardiovascular ROS: no chest pain or palpitations GI ROS: negative for nausea, vomiting, abdominal pain, diarrhea, constipation Musculoskeletal ROS: negative for - joint swelling or muscle pain Neurological ROS: negative for - confusion, syncope Dermatological ROS: negative for pruritus and rash Psychiatric: negative for anxiety, depression, difficulty sleeping and memory loss  MEDICATIONS: Current Medications        Current Outpatient Medications  Medication Sig Dispense Refill  . cetirizine (ZYRTEC) 10 MG tablet Take 1 tablet (10 mg total) by mouth  once daily 30 tablet 11   No current facility-administered medications for this visit.      ALLERGIES: Penicillin  PAST MEDICAL HISTORY:     Past Medical History:  Diagnosis Date  . Allergy   . Hypertension   . Stroke (CMS-HCC)     PAST SURGICAL HISTORY:      Past Surgical History:  Procedure Laterality Date  . CHOLECYSTECTOMY    . HYSTERECTOMY       FAMILY HISTORY:      Family History  Problem Relation Age of Onset  . Stroke Father   . Myocardial Infarction (Heart attack) Brother      SOCIAL HISTORY: Social History     Socioeconomic History  . Marital status: Married    Spouse name: Not on file  . Number of children: 10  . Years of education: Not on file  . Highest education level: Not on file  Occupational History  . Not on file  Tobacco Use  . Smoking status: Never Smoker  . Smokeless tobacco: Never Used  Vaping Use  . Vaping Use: Never used  Substance and Sexual Activity  . Alcohol use: No  . Drug use: Never  . Sexual activity: Not on file  Other Topics Concern  . Not on file  Social History Narrative  . Not on file   Social Determinants of Health      Financial Resource Strain:   . Difficulty of Paying Living Expenses:   Food Insecurity:   . Worried About Running Out of Food   in the Last Year:   . Ran Out of Food in the Last Year:   Transportation Needs:   . Lack of Transportation (Medical):   . Lack of Transportation (Non-Medical):       PHYSICAL EXAM:    Vitals:   01/14/20 1055  BP: (!) 165/100  Pulse: 87   Body mass index is 33.39 kg/m. Weight: 86.2 kg (190 lb)   GENERAL: Alert, active, oriented x3  HEENT: Pupils equal reactive to light. Extraocular movements are intact. Sclera clear. Palpebral conjunctiva normal red color.Pharynx clear.  NECK: Supple with multiple palpable adenopathy most prominent on the right neck.  LUNGS: Sound clear with no rales rhonchi or wheezes.  HEART: Regular  rhythm S1 and S2 without murmur.  ABDOMEN: Soft and depressible, nontender with no palpable mass, no hepatomegaly. Wounds dry and clean.  EXTREMITIES: Well-developed well-nourished symmetrical with no dependent edema.  NEUROLOGICAL: Awake alert oriented, facial expression symmetrical, moving all extremities.  REVIEW OF DATA: I have reviewed the following data today:      Initial consult on 01/06/2020  Component Date Value  . WBC (White Blood Cell Co* 01/06/2020 7.8   . RBC (Red Blood Cell Coun* 01/06/2020 5.16   . Hemoglobin 01/06/2020 15.4*  . Hematocrit 01/06/2020 46.2   . MCV (Mean Corpuscular Vo* 01/06/2020 89.5   . MCH (Mean Corpuscular He* 01/06/2020 29.8   . MCHC (Mean Corpuscular H* 01/06/2020 33.3   . Platelet Count 01/06/2020 242   . RDW-CV (Red Cell Distrib* 01/06/2020 13.4   . MPV (Mean Platelet Volum* 01/06/2020 12.2   . Neutrophils 01/06/2020 4.63   . Lymphocytes 01/06/2020 2.17   . Monocytes 01/06/2020 0.76   . Eosinophils 01/06/2020 0.19   . Basophils 01/06/2020 0.05   . Neutrophil % 01/06/2020 59.3   . Lymphocyte % 01/06/2020 27.7   . Monocyte % 01/06/2020 9.7   . Eosinophil % 01/06/2020 2.4   . Basophil% 01/06/2020 0.6   . Immature Granulocyte % 01/06/2020 0.3   . Immature Granulocyte Cou* 01/06/2020 0.02   . Glucose 01/06/2020 100   . Sodium 01/06/2020 139   . Potassium 01/06/2020 3.9   . Chloride 01/06/2020 105   . Carbon Dioxide (CO2) 01/06/2020 27.9   . Urea Nitrogen (BUN) 01/06/2020 14   . Creatinine 01/06/2020 1.2*  . Glomerular Filtration Ra* 01/06/2020 45*  . Calcium 01/06/2020 9.7   . AST  01/06/2020 21   . ALT  01/06/2020 22   . Alk Phos (alkaline Phosp* 01/06/2020 110*  . Albumin 01/06/2020 4.4   . Bilirubin, Total 01/06/2020 0.7   . Protein, Total 01/06/2020 6.7   . A/G Ratio 01/06/2020 1.9   . Thyroid Stimulating Horm* 01/06/2020 2.513   . Color 01/06/2020 Yellow   . Clarity 01/06/2020 Clear   . Specific Gravity 01/06/2020 1.020    . pH, Urine 01/06/2020 6.0   . Protein, Urinalysis 01/06/2020 Negative   . Glucose, Urinalysis 01/06/2020 Negative   . Ketones, Urinalysis 01/06/2020 Negative   . Blood, Urinalysis 01/06/2020 Small*  . Nitrite, Urinalysis 01/06/2020 Negative   . Leukocyte Esterase, Urin* 01/06/2020 Negative   . White Blood Cells, Urina* 01/06/2020 0-3   . Red Blood Cells, Urinaly* 01/06/2020 0-3   . Bacteria, Urinalysis 01/06/2020 Rare*  . Squamous Epithelial Cell* 01/06/2020 Rare   . Cholesterol, Total 01/06/2020 193   . Triglyceride 01/06/2020 189   . HDL (High Density Lipopr* 01/06/2020 36.0   . LDL Calculated 01/06/2020 119   . VLDL Cholesterol   01/06/2020 38   . Cholesterol/HDL Ratio 01/06/2020 5.4   . Hemoglobin A1C 01/06/2020 5.7*  . Average Blood Glucose (C* 01/06/2020 117      ASSESSMENT: Ms. Ridlon is a 64 y.o. female presenting for consultation for excisional biopsy of lymph node of the neck.  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  Patient with significantly enlarged lymph node of the neck.  I perform a bedside ultrasound confirming the lymph node seen on the CT scan.  Patient already evaluated by medical oncology who recommended excisional biopsy for adequate tissue evaluation and diagnosis.  I discussed with the patient the alternative of surgical management that include excision of right neck lymph node.  I have answered the patient about the risk of surgery that includes bleeding, infection, pain, scar, persistent drainage, among others.  Patient reported to and agreed to proceed.  Head and neck lymphadenopathy [R59.1]  PLAN: 1. Excision of right cervical node (38510) 2. Avoid taking aspirin 5 days before the surgery 3. Contact us if has any question or concern.   Patient her husband verbalized understanding, all questions were answered, and were agreeable with the plan outlined above.     Vaanya Shambaugh  Cintron-Diaz, MD  Electronically signed by Xiadani Damman Cintron-Diaz, MD  

## 2020-01-15 NOTE — H&P (View-Only) (Signed)
PATIENT PROFILE: Danielle Lewis is a 64 y.o. female who presents to the Clinic for consultation at the request of Dr. Rogue Bussing for evaluation of cervical lymph node biopsy.  PCP:  Joan Flores, NP  HISTORY OF PRESENT ILLNESS: [name] reports that her family noticed that she has increased lumps on her neck.  They have been noted for the last few weeks.  She denies any pain.  There is no pain radiation.  There is no alleviating or aggravating factor.  Patient denies any night sweats.  Denies any weight loss.  Denies any fevers.  Patient went to see her primary care physician who ordered a CT scan of the neck.  This shows the enlarged lymph nodes mostly on the right neck.  Patient was evaluated by oncology and a PET scan was ordered showing lymphadenopathy on the neck and groin area.  I personally evaluated the images of the CT scan and the PET scan.   PROBLEM LIST:     Problem List  Date Reviewed: 01/07/2020   None      GENERAL REVIEW OF SYSTEMS:   General ROS: negative for - chills, fatigue, fever, weight gain or weight loss Allergy and Immunology ROS: negative for - hives  Hematological and Lymphatic ROS: negative for - bleeding problems or bruising, positive for palpable nodes Endocrine ROS: negative for - heat or cold intolerance, hair changes Respiratory ROS: negative for - cough, shortness of breath or wheezing Cardiovascular ROS: no chest pain or palpitations GI ROS: negative for nausea, vomiting, abdominal pain, diarrhea, constipation Musculoskeletal ROS: negative for - joint swelling or muscle pain Neurological ROS: negative for - confusion, syncope Dermatological ROS: negative for pruritus and rash Psychiatric: negative for anxiety, depression, difficulty sleeping and memory loss  MEDICATIONS: Current Medications        Current Outpatient Medications  Medication Sig Dispense Refill  . cetirizine (ZYRTEC) 10 MG tablet Take 1 tablet (10 mg total) by mouth  once daily 30 tablet 11   No current facility-administered medications for this visit.      ALLERGIES: Penicillin  PAST MEDICAL HISTORY:     Past Medical History:  Diagnosis Date  . Allergy   . Hypertension   . Stroke (CMS-HCC)     PAST SURGICAL HISTORY:      Past Surgical History:  Procedure Laterality Date  . CHOLECYSTECTOMY    . HYSTERECTOMY       FAMILY HISTORY:      Family History  Problem Relation Age of Onset  . Stroke Father   . Myocardial Infarction (Heart attack) Brother      SOCIAL HISTORY: Social History     Socioeconomic History  . Marital status: Married    Spouse name: Not on file  . Number of children: 10  . Years of education: Not on file  . Highest education level: Not on file  Occupational History  . Not on file  Tobacco Use  . Smoking status: Never Smoker  . Smokeless tobacco: Never Used  Vaping Use  . Vaping Use: Never used  Substance and Sexual Activity  . Alcohol use: No  . Drug use: Never  . Sexual activity: Not on file  Other Topics Concern  . Not on file  Social History Narrative  . Not on file   Social Determinants of Health      Financial Resource Strain:   . Difficulty of Paying Living Expenses:   Food Insecurity:   . Worried About Charity fundraiser  in the Last Year:   . [place] in the Last Year:   Transportation Needs:   . Film/video editor (Medical):   Marland Kitchen Lack of Transportation (Non-Medical):       PHYSICAL EXAM:    Vitals:   01/14/20 1055  BP: (!) 165/100  Pulse: 87   Body mass index is 33.39 kg/m. Weight: 86.2 kg (190 lb)   GENERAL: Alert, active, oriented x3  HEENT: Pupils equal reactive to light. Extraocular movements are intact. Sclera clear. Palpebral conjunctiva normal red color.Pharynx clear.  NECK: Supple with multiple palpable adenopathy most prominent on the right neck.  LUNGS: Sound clear with no rales rhonchi or wheezes.  HEART: Regular  rhythm S1 and S2 without murmur.  ABDOMEN: Soft and depressible, nontender with no palpable mass, no hepatomegaly. Wounds dry and clean.  EXTREMITIES: Well-developed well-nourished symmetrical with no dependent edema.  NEUROLOGICAL: Awake alert oriented, facial expression symmetrical, moving all extremities.  REVIEW OF DATA: I have reviewed the following data today:      Initial consult on 01/06/2020  Component Date Value  . WBC (White Blood Cell Co* 01/06/2020 7.8   . RBC (Red Blood Cell Coun* 01/06/2020 5.16   . Hemoglobin 01/06/2020 15.4*  . Hematocrit 01/06/2020 46.2   . MCV (Mean Corpuscular Vo* 01/06/2020 89.5   . MCH (Mean Corpuscular He* 01/06/2020 29.8   . MCHC (Mean Corpuscular H* 01/06/2020 33.3   . Platelet Count 01/06/2020 242   . RDW-CV (Red Cell Distrib* 01/06/2020 13.4   . MPV (Mean Platelet Volum* 01/06/2020 12.2   . Neutrophils 01/06/2020 4.63   . Lymphocytes 01/06/2020 2.17   . Monocytes 01/06/2020 0.76   . Eosinophils 01/06/2020 0.19   . Basophils 01/06/2020 0.05   . Neutrophil % 01/06/2020 59.3   . Lymphocyte % 01/06/2020 27.7   . Monocyte % 01/06/2020 9.7   . Eosinophil % 01/06/2020 2.4   . Basophil% 01/06/2020 0.6   . Immature Granulocyte % 01/06/2020 0.3   . Immature Granulocyte Cou* 01/06/2020 0.02   . Glucose 01/06/2020 100   . Sodium 01/06/2020 139   . Potassium 01/06/2020 3.9   . Chloride 01/06/2020 105   . Carbon Dioxide (CO2) 01/06/2020 27.9   . Urea Nitrogen (BUN) 01/06/2020 14   . Creatinine 01/06/2020 1.2*  . Glomerular Filtration Ra* 01/06/2020 45*  . Calcium 01/06/2020 9.7   . AST  01/06/2020 21   . ALT  01/06/2020 22   . Alk Phos (alkaline Phosp* 01/06/2020 110*  . Albumin 01/06/2020 4.4   . Bilirubin, Total 01/06/2020 0.7   . Protein, Total 01/06/2020 6.7   . A/G Ratio 01/06/2020 1.9   . Thyroid Stimulating Horm* 01/06/2020 2.513   . Color 01/06/2020 Yellow   . Clarity 01/06/2020 Clear   . Specific Gravity 01/06/2020 1.020    . pH, Urine 01/06/2020 6.0   . Protein, Urinalysis 01/06/2020 Negative   . Glucose, Urinalysis 01/06/2020 Negative   . Ketones, Urinalysis 01/06/2020 Negative   . Blood, Urinalysis 01/06/2020 Small*  . Nitrite, Urinalysis 01/06/2020 Negative   . Leukocyte Esterase, Urin* 01/06/2020 Negative   . White Blood Cells, Urina* 01/06/2020 0-3   . Red Blood Cells, Urinaly* 01/06/2020 0-3   . Bacteria, Urinalysis 01/06/2020 Rare*  . Squamous Epithelial Cell* 01/06/2020 Rare   . Cholesterol, Total 01/06/2020 193   . Triglyceride 01/06/2020 189   . HDL (High Density Lipopr* 01/06/2020 36.0   . LDL Calculated 01/06/2020 119   . VLDL Cholesterol  01/06/2020 38   . Cholesterol/HDL Ratio 01/06/2020 5.4   . Hemoglobin A1C 01/06/2020 5.7*  . Average Blood Glucose (C* 01/06/2020 117      ASSESSMENT: Ms. Krenzer is a 64 y.o. female presenting for consultation for excisional biopsy of lymph node of the neck.  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  Patient with significantly enlarged lymph node of the neck.  I perform a bedside ultrasound confirming the lymph node seen on the CT scan.  Patient already evaluated by medical oncology who recommended excisional biopsy for adequate tissue evaluation and diagnosis.  I discussed with the patient the alternative of surgical management that include excision of right neck lymph node.  I have answered the patient about the risk of surgery that includes bleeding, infection, pain, scar, persistent drainage, among others.  Patient reported to and agreed to proceed.  Head and neck lymphadenopathy [R59.1]  PLAN: 1. Excision of right cervical node (38510) 2. Avoid taking aspirin 5 days before the surgery 3. Contact us if has any question or concern.   Patient her husband verbalized understanding, all questions were answered, and were agreeable with the plan outlined above.     Herbert Pun, MD  Electronically signed by Herbert Pun, MD

## 2020-01-19 ENCOUNTER — Other Ambulatory Visit: Payer: Self-pay

## 2020-01-19 ENCOUNTER — Encounter
Admission: RE | Admit: 2020-01-19 | Discharge: 2020-01-19 | Disposition: A | Discharge status: Home / Self Care | Payer: Self-pay | Source: Ambulatory Visit | Attending: General Surgery | Admitting: General Surgery

## 2020-01-19 DIAGNOSIS — Z01818 Encounter for other preprocedural examination: Secondary | ICD-10-CM | POA: Insufficient documentation

## 2020-01-19 HISTORY — DX: Gastro-esophageal reflux disease without esophagitis: K21.9

## 2020-01-19 HISTORY — DX: Anxiety disorder, unspecified: F41.9

## 2020-01-19 NOTE — Patient Instructions (Signed)
Your procedure is scheduled on: Frid. 5/28 Report to Day Surgery. To find out your arrival time please call [phone] between 1PM - 3PM on Thurs 5/27.  Remember: Instructions that are not followed completely may result in serious medical risk,  up to and including death, or upon the discretion of your surgeon and anesthesiologist your  surgery may need to be rescheduled.     _X__ 1. Do not eat food after midnight the night before your procedure.                 No gum chewing or hard candies. You may drink clear liquids up to 2 hours                 before you are scheduled to arrive for your surgery- DO not drink clear                 liquids within 2 hours of the start of your surgery.                 Clear Liquids include:  water, apple juice without pulp, clear Gatorade, G2 or                  Gatorade Zero (avoid Red/Purple/Blue), Black Coffee or Tea (Do not add                 anything to coffee or tea). _____2.   Complete the carbohydrate drink provided to you, 2 hours before arrival.  __X__2.  On the morning of surgery brush your teeth with toothpaste and water, you                may rinse your mouth with mouthwash if you wish.  Do not swallow any toothpaste of mouthwash.     _X__ 3.  No Alcohol for 24 hours before or after surgery.   ___ 4.  Do Not Smoke or use e-cigarettes For 24 Hours Prior to Your Surgery.                 Do not use any chewable tobacco products for at least 6 hours prior to                 Surgery.  __  5.  Do not use any recreational drugs (marijuana, cocaine, heroin, ecstacy, MDMA or other)                For at least one week prior to your surgery.  Combination of these drugs with anesthesia                May have life threatening results.  ____  6.  Bring all medications with you on the day of surgery if instructed.   __x__  7.  Notify your doctor if there is any change in your medical condition      (cold, fever,  infections).     Do not wear jewelry, make-up, hairpins, clips or nail polish. Do not wear lotions, powders, or perfumes. . Do not shave 48 hours prior to surgery. Do not bring valuables to the hospital.    [facility] is not responsible for any belongings or valuables.  Contacts, dentures or bridgework may not be worn into surgery. Leave your suitcase in the car. After surgery it may be brought to your room. For patients admitted to the hospital, discharge time is determined by your treatment team.   Patients discharged the day of surgery  will not be allowed to drive home.   Make arrangements for someone to be with you for the first 24 hours of your Same Day Discharge.    Please read over the following fact sheets that you were given:  x  ____ Take these medicines the morning of surgery with A SIP OF WATER:    1. none  2.   3.   4.  5.  6.  ____ Fleet Enema (as directed)   __x__ Use CHG Soap (or wipes) as directed  ____ Use Benzoyl Peroxide Gel as instructed  ____ Use inhalers on the day of surgery  ____ Stop metformin 2 days prior to surgery    ____ Take 1/2 of usual insulin dose the night before surgery. No insulin the morning          of surgery.   ____ Stop Coumadin/Plavix/aspirin   __x__ Stop Anti-inflammatories naproxen sodium (ALEVE) 220 MG tablet today   ____ Stop supplements until after surgery.    ____ Bring C-Pap to the hospital.

## 2020-01-20 ENCOUNTER — Other Ambulatory Visit: Payer: Self-pay

## 2020-01-20 ENCOUNTER — Ambulatory Visit: Admission: RE | Admit: 2020-01-20 | Payer: Self-pay | Source: Ambulatory Visit | Admitting: *Deleted

## 2020-01-20 ENCOUNTER — Encounter
Admission: RE | Admit: 2020-01-20 | Discharge: 2020-01-20 | Disposition: A | Discharge status: Home / Self Care | Payer: Self-pay | Source: Ambulatory Visit | Attending: General Surgery | Admitting: General Surgery

## 2020-01-20 ENCOUNTER — Other Ambulatory Visit: Admission: RE | Admit: 2020-01-20 | Payer: Self-pay | Source: Ambulatory Visit

## 2020-01-20 DIAGNOSIS — Z20822 Contact with and (suspected) exposure to covid-19: Secondary | ICD-10-CM | POA: Insufficient documentation

## 2020-01-20 DIAGNOSIS — I447 Left bundle-branch block, unspecified: Secondary | ICD-10-CM | POA: Insufficient documentation

## 2020-01-20 DIAGNOSIS — Z01818 Encounter for other preprocedural examination: Secondary | ICD-10-CM | POA: Insufficient documentation

## 2020-01-20 DIAGNOSIS — I10 Essential (primary) hypertension: Secondary | ICD-10-CM | POA: Insufficient documentation

## 2020-01-20 LAB — SARS CORONAVIRUS 2 (TAT 6-24 HRS): SARS Coronavirus 2: NEGATIVE

## 2020-01-20 NOTE — Pre-Procedure Instructions (Addendum)
Pre-Admit Testing Provider Notification Note  Provider Notified: Lang Snow, NP  Notification Mode: 1) Fax 2) Phone call directly to [facility].  Reason: Request for Medical Clearance  Response:  1) Fax confirmation received. 2) Holley Raring will work this patient in and assess.  Additional Information: Placed on chart.  Signed: Beulah Gandy, RN

## 2020-01-22 ENCOUNTER — Ambulatory Visit
Admission: RE | Admit: 2020-01-22 | Discharge: 2020-01-22 | Disposition: A | Discharge status: Home / Self Care | Payer: Self-pay | Attending: General Surgery | Admitting: General Surgery

## 2020-01-22 ENCOUNTER — Encounter: Payer: Self-pay | Admitting: General Surgery

## 2020-01-22 ENCOUNTER — Ambulatory Visit: Payer: Self-pay | Admitting: Anesthesiology

## 2020-01-22 ENCOUNTER — Encounter
Admission: RE | Disposition: A | Discharge status: Home / Self Care | Payer: Self-pay | Source: Home / Self Care | Attending: General Surgery

## 2020-01-22 ENCOUNTER — Other Ambulatory Visit: Payer: Self-pay

## 2020-01-22 DIAGNOSIS — Z880 Allergy status to penicillin: Secondary | ICD-10-CM | POA: Insufficient documentation

## 2020-01-22 DIAGNOSIS — E669 Obesity, unspecified: Secondary | ICD-10-CM | POA: Insufficient documentation

## 2020-01-22 DIAGNOSIS — Z6833 Body mass index (BMI) 33.0-33.9, adult: Secondary | ICD-10-CM | POA: Insufficient documentation

## 2020-01-22 DIAGNOSIS — Z8249 Family history of ischemic heart disease and other diseases of the circulatory system: Secondary | ICD-10-CM | POA: Insufficient documentation

## 2020-01-22 DIAGNOSIS — I10 Essential (primary) hypertension: Secondary | ICD-10-CM | POA: Insufficient documentation

## 2020-01-22 DIAGNOSIS — Z8673 Personal history of transient ischemic attack (TIA), and cerebral infarction without residual deficits: Secondary | ICD-10-CM | POA: Insufficient documentation

## 2020-01-22 DIAGNOSIS — C8201 Follicular lymphoma grade I, lymph nodes of head, face, and neck: Secondary | ICD-10-CM | POA: Insufficient documentation

## 2020-01-22 HISTORY — PX: LYMPH GLAND EXCISION: SHX13

## 2020-01-22 SURGERY — EXCISION, LYMPH NODE, CERVICAL
Anesthesia: General | Site: Neck | Laterality: Right

## 2020-01-22 MED ORDER — ONDANSETRON HCL 4 MG/2ML IJ SOLN
INTRAMUSCULAR | Status: DC | PRN
  Administered 2020-01-22: 4 mg via INTRAVENOUS

## 2020-01-22 MED ORDER — CLINDAMYCIN PHOSPHATE 900 MG/50ML IV SOLN
INTRAVENOUS | Status: AC
  Filled 2020-01-22: qty 50

## 2020-01-22 MED ORDER — DEXAMETHASONE SODIUM PHOSPHATE 10 MG/ML IJ SOLN
INTRAMUSCULAR | Status: AC
  Filled 2020-01-22: qty 1

## 2020-01-22 MED ORDER — LACTATED RINGERS IV SOLN: INTRAVENOUS | Status: DC

## 2020-01-22 MED ORDER — EPHEDRINE SULFATE 50 MG/ML IJ SOLN
INTRAMUSCULAR | Status: DC | PRN
  Administered 2020-01-22 (×5): 10 mg via INTRAVENOUS

## 2020-01-22 MED ORDER — LIDOCAINE HCL (CARDIAC) PF 100 MG/5ML IV SOSY
PREFILLED_SYRINGE | INTRAVENOUS | Status: DC | PRN
  Administered 2020-01-22: 100 mg via INTRAVENOUS

## 2020-01-22 MED ORDER — LIDOCAINE HCL (PF) 2 % IJ SOLN
INTRAMUSCULAR | Status: AC
  Filled 2020-01-22: qty 5

## 2020-01-22 MED ORDER — PROPOFOL 10 MG/ML IV BOLUS
INTRAVENOUS | Status: AC
  Filled 2020-01-22: qty 20

## 2020-01-22 MED ORDER — BUPIVACAINE-EPINEPHRINE (PF) 0.25% -1:200000 IJ SOLN
INTRAMUSCULAR | Status: AC
  Filled 2020-01-22: qty 30

## 2020-01-22 MED ORDER — CLINDAMYCIN PHOSPHATE 900 MG/50ML IV SOLN
900.0000 mg | INTRAVENOUS | Status: AC
  Administered 2020-01-22: 900 mg via INTRAVENOUS

## 2020-01-22 MED ORDER — FENTANYL CITRATE (PF) 100 MCG/2ML IJ SOLN
INTRAMUSCULAR | Status: AC
  Filled 2020-01-22: qty 2

## 2020-01-22 MED ORDER — FAMOTIDINE 20 MG PO TABS
ORAL_TABLET | ORAL | Status: AC
  Administered 2020-01-22: 20 mg via ORAL
  Filled 2020-01-22: qty 1

## 2020-01-22 MED ORDER — HYDROCODONE-ACETAMINOPHEN 5-325 MG PO TABS: 1.0000 | ORAL_TABLET | ORAL | 0 refills | Status: AC | PRN

## 2020-01-22 MED ORDER — FAMOTIDINE 20 MG PO TABS: 20.0000 mg | ORAL_TABLET | Freq: Once | ORAL | Status: AC

## 2020-01-22 MED ORDER — CHLORHEXIDINE GLUCONATE 0.12 % MT SOLN
OROMUCOSAL | Status: AC
  Administered 2020-01-22: 15 mL via OROMUCOSAL
  Filled 2020-01-22: qty 15

## 2020-01-22 MED ORDER — DEXAMETHASONE SODIUM PHOSPHATE 10 MG/ML IJ SOLN
INTRAMUSCULAR | Status: DC | PRN
  Administered 2020-01-22: 5 mg via INTRAVENOUS

## 2020-01-22 MED ORDER — ORAL CARE MOUTH RINSE: 15.0000 mL | Freq: Once | OROMUCOSAL | Status: AC

## 2020-01-22 MED ORDER — PROMETHAZINE HCL 25 MG/ML IJ SOLN: 6.2500 mg | INTRAMUSCULAR | Status: DC | PRN

## 2020-01-22 MED ORDER — ONDANSETRON HCL 4 MG/2ML IJ SOLN
INTRAMUSCULAR | Status: AC
  Filled 2020-01-22: qty 2

## 2020-01-22 MED ORDER — CHLORHEXIDINE GLUCONATE 0.12 % MT SOLN: 15.0000 mL | Freq: Once | OROMUCOSAL | Status: AC

## 2020-01-22 MED ORDER — BUPIVACAINE-EPINEPHRINE 0.25% -1:200000 IJ SOLN
INTRAMUSCULAR | Status: DC | PRN
  Administered 2020-01-22: 20 mL

## 2020-01-22 MED ORDER — MIDAZOLAM HCL 2 MG/2ML IJ SOLN
INTRAMUSCULAR | Status: DC | PRN
  Administered 2020-01-22: 2 mg via INTRAVENOUS

## 2020-01-22 MED ORDER — PROPOFOL 10 MG/ML IV BOLUS
INTRAVENOUS | Status: DC | PRN
  Administered 2020-01-22: 200 mg via INTRAVENOUS

## 2020-01-22 MED ORDER — PHENYLEPHRINE HCL (PRESSORS) 10 MG/ML IV SOLN
INTRAVENOUS | Status: DC | PRN
  Administered 2020-01-22: 200 ug via INTRAVENOUS
  Administered 2020-01-22: 100 ug via INTRAVENOUS
  Administered 2020-01-22 (×2): 200 ug via INTRAVENOUS
  Administered 2020-01-22: 100 ug via INTRAVENOUS

## 2020-01-22 MED ORDER — MIDAZOLAM HCL 2 MG/2ML IJ SOLN
INTRAMUSCULAR | Status: AC
  Filled 2020-01-22: qty 2

## 2020-01-22 MED ORDER — FENTANYL CITRATE (PF) 100 MCG/2ML IJ SOLN: 25.0000 ug | INTRAMUSCULAR | Status: DC | PRN

## 2020-01-22 MED ORDER — FENTANYL CITRATE (PF) 100 MCG/2ML IJ SOLN
INTRAMUSCULAR | Status: DC | PRN
  Administered 2020-01-22 (×2): 50 ug via INTRAVENOUS

## 2020-01-22 MED ORDER — EPHEDRINE 5 MG/ML INJ
INTRAVENOUS | Status: AC
  Filled 2020-01-22: qty 10

## 2020-01-22 SURGICAL SUPPLY — 33 items
APPLIER CLIP 11 MED OPEN (CLIP) ×3
BLADE SURG 15 STRL LF DISP TIS (BLADE) ×1 IMPLANT
BLADE SURG 15 STRL SS (BLADE) ×2
CANISTER SUCT 1200ML W/VALVE (MISCELLANEOUS) ×3 IMPLANT
CHLORAPREP W/TINT 26 (MISCELLANEOUS) ×3 IMPLANT
CLIP APPLIE 11 MED OPEN (CLIP) ×1 IMPLANT
CNTNR SPEC 2.5X3XGRAD LEK (MISCELLANEOUS)
CONT SPEC 4OZ STER OR WHT (MISCELLANEOUS)
CONTAINER SPEC 2.5X3XGRAD LEK (MISCELLANEOUS) IMPLANT
COVER WAND RF STERILE (DRAPES) ×3 IMPLANT
DERMABOND ADVANCED (GAUZE/BANDAGES/DRESSINGS) ×2
DERMABOND ADVANCED .7 DNX12 (GAUZE/BANDAGES/DRESSINGS) ×1 IMPLANT
DRAPE LAPAROTOMY 77X122 PED (DRAPES) ×3 IMPLANT
ELECT CAUTERY BLADE 6.4 (BLADE) ×3 IMPLANT
ELECT REM PT RETURN 9FT ADLT (ELECTROSURGICAL) ×3
ELECTRODE REM PT RTRN 9FT ADLT (ELECTROSURGICAL) ×1 IMPLANT
GLOVE BIO SURGEON STRL SZ 6.5 (GLOVE) ×2 IMPLANT
GLOVE BIO SURGEONS STRL SZ 6.5 (GLOVE) ×1
GLOVE BIOGEL PI IND STRL 6.5 (GLOVE) ×1 IMPLANT
GLOVE BIOGEL PI INDICATOR 6.5 (GLOVE) ×2
GOWN STRL REUS W/ TWL LRG LVL3 (GOWN DISPOSABLE) ×2 IMPLANT
GOWN STRL REUS W/TWL LRG LVL3 (GOWN DISPOSABLE) ×4
KIT TURNOVER KIT A (KITS) ×3 IMPLANT
LABEL OR SOLS (LABEL) ×3 IMPLANT
NEEDLE HYPO 22GX1.5 SAFETY (NEEDLE) ×3 IMPLANT
NS IRRIG 500ML POUR BTL (IV SOLUTION) ×3 IMPLANT
PACK BASIN MINOR (MISCELLANEOUS) ×3 IMPLANT
SUT MNCRL 4-0 (SUTURE) ×2
SUT MNCRL 4-0 27XMFL (SUTURE) ×1
SUT VIC AB 3-0 SH 27 (SUTURE) ×2
SUT VIC AB 3-0 SH 27X BRD (SUTURE) ×1 IMPLANT
SUTURE MNCRL 4-0 27XMF (SUTURE) ×1 IMPLANT
SYR 10ML LL (SYRINGE) ×3 IMPLANT

## 2020-01-22 NOTE — Anesthesia Preprocedure Evaluation (Signed)
Anesthesia Evaluation  Patient identified by MRN, date of birth, ID band Patient awake    Reviewed: Allergy & Precautions, H&P , NPO status , Patient's Chart, lab work & pertinent test results, reviewed documented beta blocker date and time   History of Anesthesia Complications Negative for: history of anesthetic complications  Airway Mallampati: III  TM Distance: >3 FB Neck ROM: full    Dental  (+) Chipped, Missing, Dental Advidsory Given, Teeth Intact   Pulmonary neg pulmonary ROS,    Pulmonary exam normal breath sounds clear to auscultation       Cardiovascular Exercise Tolerance: Good negative cardio ROS Normal cardiovascular exam Rhythm:regular Rate:Normal     Neuro/Psych PSYCHIATRIC DISORDERS Anxiety negative neurological ROS     GI/Hepatic Neg liver ROS, GERD  ,  Endo/Other  negative endocrine ROS  Renal/GU negative Renal ROS  negative genitourinary   Musculoskeletal   Abdominal   Peds  Hematology negative hematology ROS (+)   Anesthesia Other Findings Past Medical History: No date: Anxiety No date: GERD (gastroesophageal reflux disease) No date: Hypertension  Obesity  Reproductive/Obstetrics negative OB ROS                             Anesthesia Physical Anesthesia Plan  ASA: II  Anesthesia Plan: General   Post-op Pain Management:    Induction: Intravenous  PONV Risk Score and Plan: 3 and Ondansetron, Dexamethasone, Midazolam, Promethazine and Treatment may vary due to age or medical condition  Airway Management Planned: Oral ETT  Additional Equipment:   Intra-op Plan:   Post-operative Plan: Extubation in OR  Informed Consent: I have reviewed the patients History and Physical, chart, labs and discussed the procedure including the risks, benefits and alternatives for the proposed anesthesia with the patient or authorized representative who has indicated his/her  understanding and acceptance.     Dental Advisory Given  Plan Discussed with: Anesthesiologist, CRNA and Surgeon  Anesthesia Plan Comments:         Anesthesia Quick Evaluation

## 2020-01-22 NOTE — Anesthesia Postprocedure Evaluation (Signed)
Anesthesia Post Note  Patient: Danielle Lewis  Procedure(s) Performed: CERVICAL LYMPH GLAND EXCISION (Right Neck)  Patient location during evaluation: PACU Anesthesia Type: General Level of consciousness: awake and alert Pain management: pain level controlled Vital Signs Assessment: post-procedure vital signs reviewed and stable Respiratory status: spontaneous breathing, nonlabored ventilation, respiratory function stable and patient connected to nasal cannula oxygen Cardiovascular status: blood pressure returned to baseline and stable Postop Assessment: no apparent nausea or vomiting Anesthetic complications: no     Last Vitals:  Vitals:   01/22/20 1122 01/22/20 1229  BP: 133/76 132/90  Pulse: 95 96  Resp: 16 18  Temp: 36.4 C   SpO2: 94% 99%    Last Pain:  Vitals:   01/22/20 1229  TempSrc:   PainSc: 0-No pain                 Martha Clan

## 2020-01-22 NOTE — Op Note (Signed)
Preoperative diagnosis: Lymphoproliferative disease  Postoperative diagnosis: Lymphoproliferative disease.   Procedure: Right neck excision of lymph nodes  Anesthesia: GETA  Surgeon: Dr. Windell Moment  Wound Classification: Clean  Indications: Patient is a 64 year old female who was noted to have enlarged lymph nodes in the neck and groin.  PET scan with high activity concerning of lymphoma.  Excision of lymph nodes needed for complete evaluation and diagnosis.  Findings: 1.  Multiple palpable lymph node on the right neck. 2.  Adequate hemostasis  Description of procedure: Following induction of general anesthesia, both arms were tucked at the sides and all bony prominences were padded. A roll was placed under the shoulders and the patient was positioned in a modified beach chair position with the neck extended. The neck was prepped and draped in a sterile fashion. A time-out was completed verifying correct patient, procedure, site, positioning, and implant(s) and/or special equipment prior to beginning this procedure.  A 2-cm incision was made in a skin crease on the right side of the neck.  The subcutaneous tissues and platysma were divided with electrocautery. Subplatysmal flaps were then raised.  Deep on the neck multiple lymph node were palpated.  Dissection was taken down until the lymph nodes were reached.  Multiple lesions were excised.  Hemostasis was achieved with clip applier. The specimen was sent to pathology and confirmation of adequate lymphatic tissue was sent.  Area was irrigated with saline.  The wound was closed in layers including closing the platysma layer.  Skin closed with Monocryl  Specimen: Right cervical lymph nodes  Complications: None  Estimated Blood Loss: 5 mL

## 2020-01-22 NOTE — Transfer of Care (Signed)
Immediate Anesthesia Transfer of Care Note  Patient: Danielle Lewis  Procedure(s) Performed: CERVICAL LYMPH GLAND EXCISION (Right Neck)  Patient Location: PACU  Anesthesia Type:General  Level of Consciousness: sedated  Airway & Oxygen Therapy: Patient Spontanous Breathing and Patient connected to face mask oxygen  Post-op Assessment: Report given to RN and Post -op Vital signs reviewed and stable  Post vital signs: Reviewed and stable  Last Vitals:  Vitals Value Taken Time  BP 125/73 01/22/20 1018  Temp 36.2 C 01/22/20 1017  Pulse 99 01/22/20 1023  Resp 17 01/22/20 1023  SpO2 96 % 01/22/20 1023  Vitals shown include unvalidated device data.  Last Pain:  Vitals:   01/22/20 1017  TempSrc:   PainSc: Asleep         Complications: No apparent anesthesia complications

## 2020-01-22 NOTE — Interval H&P Note (Signed)
History and Physical Interval Note:  01/22/2020 8:19 AM  Danielle Lewis  has presented today for surgery, with the diagnosis of R59.1 Head and neck lymphadenopathy.  The various methods of treatment have been discussed with the patient and family. After consideration of risks, benefits and other options for treatment, the patient has consented to  Procedure(s): CERVICAL LYMPH GLAND EXCISION (Right) as a surgical intervention.  The patient's history has been reviewed, patient examined, no change in status, stable for surgery.  I have reviewed the patient's chart and labs.  Right side marked in the pre procedure room. Questions were answered to the patient's satisfaction.     Herbert Pun

## 2020-01-22 NOTE — Anesthesia Procedure Notes (Signed)
Procedure Name: LMA Insertion Date/Time: 01/22/2020 8:44 AM Performed by: Letitia Neri, CRNA Pre-anesthesia Checklist: Patient identified, Patient being monitored, Timeout performed, Emergency Drugs available and Suction available Patient Re-evaluated:Patient Re-evaluated prior to induction Oxygen Delivery Method: Circle system utilized Preoxygenation: Pre-oxygenation with 100% oxygen Induction Type: IV induction Ventilation: Mask ventilation without difficulty LMA: LMA inserted LMA Size: 3.5 Tube type: Oral Number of attempts: 1 Placement Confirmation: positive ETCO2 and breath sounds checked- equal and bilateral Tube secured with: Tape Dental Injury: Teeth and Oropharynx as per pre-operative assessment

## 2020-01-22 NOTE — Discharge Instructions (Signed)
  Diet: Resume home heart healthy regular diet.   Activity: May increase activity as tolerated. Light activity and walking are encouraged. Do not drive or drink alcohol if taking narcotic pain medications.  Wound care: may shower with soapy water and pat dry (do not rub incisions), but no baths or submerging incision underwater until follow-up. (no swimming)   Medications: Resume all home medications. For mild to moderate pain: acetaminophen (Tylenol) or ibuprofen (if no kidney disease). Combining Tylenol with alcohol can substantially increase your risk of causing liver disease. Narcotic pain medications, if prescribed, can be used for severe pain, though may cause nausea, constipation, and drowsiness. Do not combine Tylenol and Norco within a 6 hour period as Norco contains Tylenol. If you do not need the narcotic pain medication, you do not need to fill the prescription.  Call office [phone]) at any time if any questions, worsening pain, fevers/chills, bleeding, drainage from incision site, or other concerns.   AMBULATORY SURGERY  DISCHARGE INSTRUCTIONS   1) The drugs that you were given will stay in your system until tomorrow so for the next 24 hours you should not:  A) Drive an automobile B) Make any legal decisions C) Drink any alcoholic beverage   2) You may resume regular meals tomorrow.  Today it is better to start with liquids and gradually work up to solid foods.  You may eat anything you prefer, but it is better to start with liquids, then soup and crackers, and gradually work up to solid foods.   3) Please notify your doctor immediately if you have any unusual bleeding, trouble breathing, redness and pain at the surgery site, drainage, fever, or pain not relieved by medication.    4) Additional Instructions:        Please contact your physician with any problems or Same Day Surgery at [phone], Monday through Friday 6 am to 4 pm, or [place] at  [facility] number at [phone].

## 2020-01-27 ENCOUNTER — Telehealth: Payer: Self-pay | Admitting: Internal Medicine

## 2020-01-27 ENCOUNTER — Encounter: Payer: Self-pay | Admitting: Internal Medicine

## 2020-01-27 NOTE — Telephone Encounter (Signed)
On 6/03- spoke to pt re: results of Biopsy; recommend follow up as planned on June 4th. Pt seemed emotional over the phone. Recommend call us in the interim if she has any questions.   GB

## 2020-01-29 ENCOUNTER — Other Ambulatory Visit: Payer: Self-pay

## 2020-01-29 ENCOUNTER — Inpatient Hospital Stay: Payer: 59 | Attending: Internal Medicine | Admitting: Internal Medicine

## 2020-01-29 DIAGNOSIS — I10 Essential (primary) hypertension: Secondary | ICD-10-CM | POA: Diagnosis not present

## 2020-01-29 DIAGNOSIS — K219 Gastro-esophageal reflux disease without esophagitis: Secondary | ICD-10-CM | POA: Diagnosis not present

## 2020-01-29 DIAGNOSIS — K760 Fatty (change of) liver, not elsewhere classified: Secondary | ICD-10-CM | POA: Diagnosis not present

## 2020-01-29 DIAGNOSIS — J323 Chronic sphenoidal sinusitis: Secondary | ICD-10-CM | POA: Insufficient documentation

## 2020-01-29 DIAGNOSIS — R5383 Other fatigue: Secondary | ICD-10-CM | POA: Insufficient documentation

## 2020-01-29 DIAGNOSIS — R232 Flushing: Secondary | ICD-10-CM | POA: Insufficient documentation

## 2020-01-29 DIAGNOSIS — M542 Cervicalgia: Secondary | ICD-10-CM | POA: Insufficient documentation

## 2020-01-29 DIAGNOSIS — C8218 Follicular lymphoma grade II, lymph nodes of multiple sites: Secondary | ICD-10-CM | POA: Insufficient documentation

## 2020-01-29 DIAGNOSIS — R5382 Chronic fatigue, unspecified: Secondary | ICD-10-CM | POA: Insufficient documentation

## 2020-01-29 DIAGNOSIS — Z79899 Other long term (current) drug therapy: Secondary | ICD-10-CM | POA: Insufficient documentation

## 2020-01-29 DIAGNOSIS — F419 Anxiety disorder, unspecified: Secondary | ICD-10-CM | POA: Diagnosis not present

## 2020-01-29 DIAGNOSIS — R634 Abnormal weight loss: Secondary | ICD-10-CM | POA: Diagnosis not present

## 2020-01-29 DIAGNOSIS — R61 Generalized hyperhidrosis: Secondary | ICD-10-CM | POA: Diagnosis not present

## 2020-01-29 DIAGNOSIS — M2578 Osteophyte, vertebrae: Secondary | ICD-10-CM | POA: Insufficient documentation

## 2020-01-29 NOTE — Progress Notes (Signed)
[facility] CONSULT NOTE  Patient Care Team: Peggye Form, NP as PCP - General (Family Medicine) Herbert Pun, MD as Consulting Physician (General Surgery)  CHIEF COMPLAINTS/PURPOSE OF CONSULTATION:   Oncology History Overview Note  # MAY 2021- Bilateral neck adenopathy right more than left; highly concerning for lymphoproliferative disorder.  Excisional biopsy right neck- [Dr.Cintron];  A. 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   B. 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   # MAY 2021-CBC/CMP normal /  mildly elevated alkaline phosphatase. [PCP; [facility]].  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  female no signal past medical history noted to have neck adenopathy is here for follow-up/to review the pathology.  Patient complains of swelling/and pain with numbness in the right neck post biopsy.  She has chronic hot flashes and cold sweats-related to postmenopausal symptoms.  She is not getting any worse.  Complains of chronic fatigue.  Otherwise no weight loss.  Nausea no vomiting.  No fevers.  Appetite is good.  Review of Systems  Constitutional: Positive for malaise/fatigue. Negative for chills, diaphoresis, fever and weight loss.  HENT: Negative for nosebleeds and sore throat.   Eyes: Negative for double vision.  Respiratory: Negative for cough, hemoptysis, sputum production, shortness of breath and wheezing.   Cardiovascular: Negative for chest pain, palpitations, orthopnea and leg swelling.  Gastrointestinal: Negative for abdominal pain, blood in stool,  constipation, diarrhea, heartburn, melena, nausea and vomiting.  Genitourinary: Negative for dysuria, frequency and urgency.  Musculoskeletal: Negative for back pain and joint pain.  Skin: Negative.  Negative for itching and rash.  Neurological: Negative for dizziness, tingling, focal weakness, weakness and headaches.  Endo/Heme/Allergies: Does not bruise/bleed easily.  Psychiatric/Behavioral: Negative for depression. The patient is not nervous/anxious and does not have insomnia.      MEDICAL HISTORY:  Past Medical History:  Diagnosis Date  . Anxiety   . GERD (gastroesophageal reflux disease)   . Hypertension     SURGICAL HISTORY: Past Surgical History:  Procedure Laterality Date  . ABDOMINAL HYSTERECTOMY    . APPENDECTOMY    . CHOLECYSTECTOMY     28years ago  . LYMPH GLAND EXCISION Right 01/22/2020   Procedure: CERVICAL LYMPH GLAND EXCISION;  Surgeon: Herbert Pun, MD;  Location: [facility] ORS;  Service: General;  Laterality: Right;  . TONSILLECTOMY      SOCIAL HISTORY: Social History   Socioeconomic History  . Marital status: Married    Spouse name: Not on file  . Number of children: Not on file  . Years of education: Not on file  . Highest education level: Not on file  Occupational History  . Not on file  Tobacco Use  . Smoking status: Never Smoker  . Smokeless tobacco: Never Used  Substance and Sexual Activity  . Alcohol use: Yes    Comment: occassional  . Drug use: Not Currently  . Sexual activity: Not on file  Other Topics Concern  . Not on file  Social History Narrative   Lives  in [place]; [place]. Never smoked; wine rarely. Used in work in [employer]. With husband.    Social Determinants of Health   Financial Resource Strain:   . Difficulty of Paying Living Expenses:   Food Insecurity:   . Worried About Charity fundraiser in the Last Year:   . Arboriculturist in the Last Year:   Transportation Needs:   . Film/video editor (Medical):    Marland Kitchen Lack of Transportation (Non-Medical):   Physical Activity:   . Days of Exercise per Week:   . Minutes of Exercise per Session:   Stress:   . Feeling of Stress :   Social Connections:   . Frequency of Communication with Friends and Family:   . Frequency of Social Gatherings with Friends and Family:   . Attends Religious Services:   . Active Member of Clubs or Organizations:   . Attends Archivist Meetings:   Marland Kitchen Marital Status:   Intimate Partner Violence:   . Fear of Current or Ex-Partner:   . Emotionally Abused:   Marland Kitchen Physically Abused:   . Sexually Abused:     FAMILY HISTORY: Family History  Problem Relation Age of Onset  . Cancer Mother        unknown type    ALLERGIES:  is allergic to penicillins.  MEDICATIONS:  Current Outpatient Medications  Medication Sig Dispense Refill  . cetirizine (ZYRTEC) 10 MG tablet Take 10 mg by mouth daily as needed for allergies.     . naproxen sodium (ALEVE) 220 MG tablet Take 440 mg by mouth daily as needed (pain).     No current facility-administered medications for this visit.      Marland Kitchen  PHYSICAL EXAMINATION: ECOG PERFORMANCE STATUS: 0 - Asymptomatic  Vitals:   01/29/20 0859  BP: (!) 148/105  Pulse: 86  Resp: 20  Temp: (!) 97.3 F (36.3 C)   Filed Weights   01/29/20 0859  Weight: 183 lb (83 kg)    Physical Exam  Constitutional: She is oriented to person, place, and time and well-developed, well-nourished, and in no distress.  HENT:  Head: Normocephalic and atraumatic.  Mouth/Throat: Oropharynx is clear and moist. No oropharyngeal exudate.  Eyes: Pupils are equal, round, and reactive to light.  Neck:  4-5 cm bulky lymph nodes noted on the right cervical chain; mild adenopathy noted on the left side.  Cardiovascular: Normal rate and regular rhythm.  Pulmonary/Chest: Effort normal and breath sounds normal. No respiratory distress. She has no wheezes.  Abdominal: Soft. Bowel sounds are normal. She exhibits no  distension and no mass. There is no abdominal tenderness. There is no rebound and no guarding.  Musculoskeletal:        General: No tenderness or edema. Normal range of motion.     Cervical back: Normal range of motion and neck supple.  Neurological: She is alert and oriented to person, place, and time.  Skin: Skin is warm.  Psychiatric: Affect normal.     LABORATORY DATA:  I have reviewed the data as listed Lab Results  Component Value Date   WBC 7.4 05/09/2015   HGB 14.7 05/09/2015   HCT 44.6 05/09/2015   MCV 92 05/09/2015   PLT 193 05/09/2015   Recent Labs    01/06/20 1506  CREATININE 1.20*    RADIOGRAPHIC STUDIES: I have personally reviewed the radiological images as listed and agreed with the findings in the report. CT SOFT TISSUE NECK W CONTRAST  Result Date: 01/06/2020  CLINICAL DATA:  Complaint of right-sided neck mass known for 2 weeks, patient denies any pain or other symptoms, denies fever. EXAM: CT NECK WITH CONTRAST TECHNIQUE: Multidetector CT imaging of the neck was performed using the standard protocol following the bolus administration of intravenous contrast. CONTRAST:  17m OMNIPAQUE IOHEXOL 300 MG/ML  SOLN COMPARISON:  Report from head CT 03/24/2003 (images unavailable). FINDINGS: Pharynx and larynx: No discrete mass is identified within the oral cavity, pharynx or larynx. Salivary glands: No inflammation, mass, or stone. The right submandibular gland is anteriorly displaced by left level II lymphadenopathy. Thyroid: Unremarkable. Lymph nodes: There is prominent bilateral cervical chain lymphadenopathy at essentially all nodal stations. Most notably, there is bulky matted lymphadenopathy at the right level II and III stations. An index right level II lymph node measures 3.8 x 2.5 cm in transaxial dimensions (series 6, image 31) (series 2, image 41). Vascular: The internal jugular veins are partially effaced by prominent bilateral cervical lymphadenopathy. Limited  intracranial: Partially empty sella turcica. Otherwise unremarkable. Visualized orbits: Visualized orbits show no acute finding. Mastoids and visualized paranasal sinuses: Complete opacification of the right sphenoid sinus. No significant mastoid effusion. Skeleton: No acute bony abnormality or aggressive osseous lesion. Cervical spondylosis. Most notably at C6-C7 there is moderate/advanced disc space narrowing with a posterior disc osteophyte complex and uncovertebral hypertrophy. Upper chest: No consolidation within the imaged lung apices. These results will be called to the ordering clinician or representative by the Radiologist Assistant, and communication documented in the PACS or [employer] IMPRESSION: Prominent bilateral cervical chain lymphadenopathy at essentially all nodal stations. Most notably, there is bulky matted lymphadenopathy at the right level II and III stations. An index right level II lymph node measures 3.8 x 2.5 cm in transaxial dimensions. Findings are highly suspicious for a lymphoproliferative process such as lymphoma or nodal metastatic disease. Consider CT imaging of the chest/abdomen/pelvis and/or PET-CT for further evaluation. Additionally, consider direct tissue sampling. No primary soft tissue neck mass is identified. Right sphenoid sinusitis. Electronically Signed   By: KKellie SimmeringDO   On: 01/06/2020 15:34   NM PET Image Initial (PI) Skull Base To Thigh  Result Date: 01/12/2020 CLINICAL DATA:  Initial treatment strategy for bilateral neck lymphadenopathy. EXAM: NUCLEAR MEDICINE PET SKULL BASE TO THIGH TECHNIQUE: 10.4 mCi F-18 FDG was injected intravenously. Full-ring PET imaging was performed from the skull base to thigh after the radiotracer. CT data was obtained and used for attenuation correction and anatomic localization. Fasting blood glucose: 89 mg/dl COMPARISON:  01/06/2020 neck CT. FINDINGS: Mediastinal blood pool activity: SUV max 2.3 Liver activity: SUV max  3.4 NECK: Extensive bilateral hypermetabolic lymphadenopathy in the neck involving levels II through V neck node chains bilaterally. Representative 2.6 cm right level 2 neck node with max SUV 16.0 (series 4/image 36). Representative 1.2 cm right level 3 neck node with max SUV 13.0 (series 4/image 46). Representative 1.5 cm right level 5 neck node with max SUV 14.9 (series 4/image 36). Representative 1.2 cm left level 2 neck node with max SUV 7.2 (series 4/image 32). Representative 1.1 cm left level 4 neck no with max SUV 7.0 (series 4/image 54). Representative 0.9 cm left level 5 neck node with max SUV 4.1 (series 4/image 35). Incidental CT findings: none CHEST: No enlarged or hypermetabolic axillary, mediastinal or hilar lymph nodes. No hypermetabolic pulmonary findings. Incidental CT findings: No acute consolidative airspace disease, lung masses or significant pulmonary nodules. ABDOMEN/PELVIS: Widespread hypermetabolic lymphadenopathy in the retroperitoneum, mesentery and bilateral  pelvis. Representative 1.5 cm short axis diameter mesenteric node with max SUV 8.6 (series 4/image 148). Representative 2.4 cm aortocaval node with max SUV 11.1 (series 4/image 161). Representative 2.2 cm left para-aortic node with max SUV 11.2 (series 4/image 157). Representative 1.8 cm left common iliac node with max SUV 8.8 (series 4/image 177). Representative 1.3 cm right external iliac node with max SUV 7.0 (series 4/image 215). Representative 1.2 cm right inguinal node with max SUV 5.8 (series 4/image 220). No abnormal hypermetabolic activity within the liver, pancreas, adrenal glands, or spleen. Normal size spleen. Incidental CT findings: Diffuse hepatic steatosis. Cholecystectomy. Moderate sigmoid diverticulosis. Hysterectomy. Simple right adnexal 2.4 cm cyst. SKELETON: No focal hypermetabolic activity to suggest skeletal metastasis. Incidental CT findings: none IMPRESSION: 1. Widespread hypermetabolic lymphadenopathy in right  greater than left neck, retroperitoneum, mesentery and bilateral pelvis, most compatible with lymphoma. Deauville score 5. 2. Normal size non hypermetabolic spleen. No evidence of hypermetabolic lymphoma in the chest. 3. Diffuse hepatic steatosis. 4. Moderate sigmoid diverticulosis. Electronically Signed   By: Ilona Sorrel M.D.   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  May 2021-PET scan lymphadenopathy bulky right neck; left neck; abdominal/retroperitoneal mesenteric and pelvic.  Maximum SUV 11.  No concerns for any transformation.  #Reviewed with the patient in general the criteria for initiating treatment include-weight loss; night sweats [see below]; fevers; extreme fatigue; organ compromise.   #Patient currently does not have any of the above symptoms; however based on PET scan/and the bulky disease-it is quite possible the patient could progress to be symptomatic quite soon.   #Discussed if and when initiated I would recommend-bendamustine Rituxan on a monthly basis x6.  Discussed treatments are usually to control the disease; response rates are about 90%; and the median PFS is about 60 months.  Maintenance could be considered.  Discussed the potential side effects including but not limited to-increasing fatigue, nausea vomiting, diarrhea,  sores in the mouth, increase risk of infection.    #Hot flashes/cold sweats-chronic secondary to postmenopausal status; patient denies any worsening of her symptomatology recently.  Monitor closely  #Patient is quite emotional; otherwise fairly asymptomatic-would recommend surveillance at this time.  I would recommend follow-up in 2 months-to assess for symptomatology.   # DISPOSITION:  # Follow up in 2 months-MD; labs- cbc/cmp/ldh-Dr.B  # I reviewed the blood work- with the  patient in detail; also reviewed the imaging independently [as summarized above]; and with the patient in detail.   Cc; Dr.Cintron/Fitzgerald.    All questions were answered. The patient knows to call the clinic with any problems, questions or concerns.    Cammie Sickle, MD 01/29/2020 11:13 AM

## 2020-01-29 NOTE — Assessment & Plan Note (Addendum)
#  Follicle lymphoma grade 1-2; above and below the diaphragm; at least stage III [no bone marrow biopsy]. 18th, May 2021-PET scan lymphadenopathy bulky right neck; left neck; abdominal/retroperitoneal mesenteric and pelvic.  Maximum SUV 11.  No concerns for any transformation.  #Reviewed with the patient in general the criteria for initiating treatment include-weight loss; night sweats [see below]; fevers; extreme fatigue; organ compromise.   #Patient currently does not have any of the above symptoms; however based on PET scan/and the bulky disease-it is quite possible the patient could progress to be symptomatic quite soon.   #Discussed if and when initiated I would recommend-bendamustine Rituxan on a monthly basis x6.  Discussed treatments are usually to control the disease; response rates are about 90%; and the median PFS is about 60 months.  Maintenance could be considered.  Discussed the potential side effects including but not limited to-increasing fatigue, nausea vomiting, diarrhea,  sores in the mouth, increase risk of infection.    #Hot flashes/cold sweats-chronic secondary to postmenopausal status; patient denies any worsening of her symptomatology recently.  Monitor closely  #Patient is quite emotional; otherwise fairly asymptomatic-would recommend surveillance at this time.  I would recommend follow-up in 2 months-to assess for symptomatology.   # DISPOSITION:  # Follow up in 2 months-MD; labs- cbc/cmp/ldh-Dr.B  # I reviewed the blood work- with the patient in detail; also reviewed the imaging independently [as summarized above]; and with the patient in detail.   Cc; Dr.Cintron/Fitzgerald.

## 2020-02-03 DIAGNOSIS — I10 Essential (primary) hypertension: Secondary | ICD-10-CM | POA: Insufficient documentation

## 2020-02-05 ENCOUNTER — Telehealth: Payer: Self-pay | Admitting: Internal Medicine

## 2020-02-05 NOTE — Telephone Encounter (Signed)
Spoke to Dr. Hedwig Morton interested in a follow-up sooner to discuss the treatment options again.  Please have the patient follow-up with me 6/18; 8:30; no labs.

## 2020-02-12 ENCOUNTER — Encounter: Payer: Self-pay | Admitting: Internal Medicine

## 2020-02-12 ENCOUNTER — Inpatient Hospital Stay (HOSPITAL_BASED_OUTPATIENT_CLINIC_OR_DEPARTMENT_OTHER): Payer: 59 | Admitting: Internal Medicine

## 2020-02-12 ENCOUNTER — Other Ambulatory Visit: Payer: Self-pay

## 2020-02-12 DIAGNOSIS — Z7189 Other specified counseling: Secondary | ICD-10-CM

## 2020-02-12 DIAGNOSIS — C8218 Follicular lymphoma grade II, lymph nodes of multiple sites: Secondary | ICD-10-CM

## 2020-02-12 NOTE — Assessment & Plan Note (Addendum)
#  Follicle lymphoma grade 1-2; above and below the diaphragm; at least stage III [no bone marrow biopsy]. 18th, May 2021-PET scan lymphadenopathy bulky right neck; left neck; abdominal/retroperitoneal mesenteric and pelvic.  Maximum SUV 11.   #Patient noted to have symptomatic disease with night sweats-new onset the last 1 to 2 weeks; also given bulky adenopathy I think it is reasonable to get started on chemotherapy.  Recommend-bendamustine Rituxan on a monthly basis x6.  Discussed treatments are usually to control the disease; response rates are about 90%; and the median PFS is about 60 months.  Maintenance could be considered.  Understands treatments are palliative not curative.  Unfortunately follicular ymphoma cannot be cured.  Discussed the potential side effects including but not limited to-increasing fatigue, nausea vomiting, diarrhea,  sores in the mouth, increase risk of infection.  We will send prescription for antiemetics.  Also sent prescription for EMLA cream.  Discussed with Dr. Peyton Najjar would recommend port placement.  #Recommend shingles prophylaxis.  # DISPOSITION:  # chemo education # July 7th; MD;labs- cbc/cmp; LDH; Rituxan-Benda; D-2-Benda- Dr.B  Cc; Dr.Cintron/Fitzgerald.

## 2020-02-12 NOTE — Progress Notes (Signed)
START ON PATHWAY REGIMEN - Lymphoma and CLL     A cycle is every 28 days:     Bendamustine      Rituximab-xxxx   **Always confirm dose/schedule in your pharmacy ordering system**  Patient Characteristics: Follicular Lymphoma, Grades 1, 2, and 3A, First Line, Stage III / IV, Symptomatic or Bulky Disease Disease Type: Follicular Lymphoma, Grade 1, 2, or 3A Disease Type: Not Applicable Disease Type: Not Applicable [place] Stage: III Line of Therapy: First Line Disease Characteristics: Symptomatic or Bulky Disease Intent of Therapy: Non-Curative / Palliative Intent, Discussed with Patient

## 2020-02-15 ENCOUNTER — Telehealth: Payer: Self-pay | Admitting: *Deleted

## 2020-02-15 ENCOUNTER — Telehealth: Payer: Self-pay | Admitting: Internal Medicine

## 2020-02-15 DIAGNOSIS — C8218 Follicular lymphoma grade II, lymph nodes of multiple sites: Secondary | ICD-10-CM

## 2020-02-15 NOTE — Telephone Encounter (Signed)
Patient called regarding her port a cath placement. She called Dr. Deniece Ree office and was informed that office does not have an order for port placement. I reached out to the surgeon's office. I explained to receptionist that Dr. Peyton Najjar spoke with Dr. Rogue Bussing and agreed to place port a cath. Msg will be sent to surgeon's nurse to set up port and get orders from Dr. Peyton Najjar.  Contacted patient and updated her that her surgeon will reach out to her to schedule this apt.

## 2020-02-15 NOTE — Telephone Encounter (Signed)
Patient phoned on this date stating that she called to schedule her port, but that they did not have the order for this yet. Message sent to MD team asking for them to follow up on this.

## 2020-02-16 ENCOUNTER — Ambulatory Visit: Payer: Self-pay | Admitting: General Surgery

## 2020-02-16 NOTE — H&P (Signed)
HISTORY OF PRESENT ILLNESS:    Danielle Lewis is a 64 y.o.female patient who comes for evaluation and discussion of Port-A-Cath.  Patient had initially presented with neck lymphadenopathy.  Upon further evaluation by medical oncologist, excisional biopsy of the lymph nodes was requested.  I performed the excisional biopsy of the lymph nodes.  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  Patient was evaluated by medical oncology with any results and chemotherapy was recommended.  Patient here for discussion of Port-A-Cath placement.  Patient denies any fever, cough, shortness of breath.  Patient denies any chest pain.  Patient does have soreness on the surgical area of the right neck.  The pain does not radiate to other parts of body.  There is no alleviating or aggravating factor.  The soreness has been there for 3 weeks.      PAST MEDICAL HISTORY:      Past Medical History:  Diagnosis Date  . Allergy   . History of cancer 2/63/3354   Follicular Lymphoma  . Hypertension   . Stroke (CMS-HCC)         PAST SURGICAL HISTORY:        Past Surgical History:  Procedure Laterality Date  . CHOLECYSTECTOMY    . Excision of right cervical node  Right 01/08/2020  . HYSTERECTOMY           MEDICATIONS:  Encounter Medications        Outpatient Encounter Medications as of 02/16/2020  Medication Sig Dispense Refill  . ALPRAZolam (XANAX) 0.25 MG tablet Take 1 tablet (0.25 mg total) by mouth nightly as needed for Sleep for up to 30 days 30 tablet 0  . naproxen sodium (ALEVE) 220 MG tablet Take by mouth     No facility-administered encounter medications on file as of 02/16/2020.       ALLERGIES:   Penicillin   SOCIAL HISTORY:  Social History     Socioeconomic History  . Marital status: Married    Spouse name: Not on file  . Number of children: 10  . Years of education: Not on file  . Highest education level: Not on file  Occupational History  . Not on file   Tobacco Use  . Smoking status: Never Smoker  . Smokeless tobacco: Never Used  Vaping Use  . Vaping Use: Never used  Substance and Sexual Activity  . Alcohol use: No  . Drug use: Never  . Sexual activity: Not on file  Other Topics Concern  . Not on file  Social History Narrative  . Not on file   Social Determinants of Health      Financial Resource Strain:   . Difficulty of Paying Living Expenses:   Food Insecurity:   . Worried About Charity fundraiser in the Last Year:   . Arboriculturist in the Last Year:   Transportation Needs:   . Film/video editor (Medical):   Marland Kitchen Lack of Transportation (Non-Medical):       FAMILY HISTORY:       Family History  Problem Relation Age of Onset  . Stroke Father   . Myocardial Infarction (Heart attack) Brother      GENERAL REVIEW OF SYSTEMS:   General ROS: negative for - chills, fatigue, fever, weight gain or weight loss Allergy and Immunology ROS: negative for - hives  Hematological and Lymphatic ROS: negative for - bleeding problems or bruising, positive for palpable nodes Endocrine ROS: negative for - heat or cold intolerance, hair  changes Respiratory ROS: negative for - cough, shortness of breath or wheezing Cardiovascular ROS: no chest pain or palpitations GI ROS: negative for nausea, vomiting, abdominal pain, diarrhea, constipation Musculoskeletal ROS: negative for - joint swelling or muscle pain Neurological ROS: negative for - confusion, syncope Dermatological ROS: negative for pruritus and rash  PHYSICAL EXAM:     Vitals:   02/16/20 1315  BP: (!) 168/93  Pulse: 95  Temp: 36.4 C (97.5 F)  .  Ht:160 cm (5\' 3" ) Wt:86.6 kg (191 lb) TML:YYTK surface area is 1.96 meters squared. Body mass index is 33.83 kg/m.Marland Kitchen   GENERAL: Alert, active, oriented x3  HEENT: Pupils equal reactive to light. Extraocular movements are intact. Sclera clear. Palpebral conjunctiva normal red color.Pharynx clear.  NECK:  Supple with no palpable mass and palpable bilateral adenopathy  LUNGS: Sound clear with no rales rhonchi or wheezes.  HEART: Regular rhythm S1 and S2 without murmur.  ABDOMEN: Soft and depressible, nontender with no palpable mass, no hepatomegaly.   EXTREMITIES: Well-developed well-nourished symmetrical with no dependent edema.  NEUROLOGICAL: Awake alert oriented, facial expression symmetrical, moving all extremities.      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  Upon discussion with medical oncology, chemotherapy was recommended.  Patient today comes for discussion of insertion of Port-A-Cath.  I discussed with the patient the surgical technique of introducing the Port-A-Cath.  I discussed with the patient the possible risks of surgery that includes: Bleeding, infection, hematoma, pneumothorax, hemothorax, arteriovenous fistula, pseudoaneurysm, among others.          PLAN:  1. Insertion of Port a Cath [phone], [id number], [id number]) 2. Do not take aspirin 5 days before surgery 3. Contact us if has any question or concern.  Patient verbalized understanding, all questions were answered, and were agreeable with the plan outlined above.   Herbert Pun, MD  Electronically signed by Herbert Pun, MD

## 2020-02-16 NOTE — H&P (View-Only) (Signed)
HISTORY OF PRESENT ILLNESS:    Ms. Okonski is a 64 y.o.female patient who comes for evaluation and discussion of Port-A-Cath.  Patient had initially presented with neck lymphadenopathy.  Upon further evaluation by medical oncologist, excisional biopsy of the lymph nodes was requested.  I performed the excisional biopsy of the lymph nodes.  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  Patient was evaluated by medical oncology with any results and chemotherapy was recommended.  Patient here for discussion of Port-A-Cath placement.  Patient denies any fever, cough, shortness of breath.  Patient denies any chest pain.  Patient does have soreness on the surgical area of the right neck.  The pain does not radiate to other parts of body.  There is no alleviating or aggravating factor.  The soreness has been there for 3 weeks.      PAST MEDICAL HISTORY:      Past Medical History:  Diagnosis Date  . Allergy   . History of cancer 12/10/6061   Follicular Lymphoma  . Hypertension   . Stroke (CMS-HCC)         PAST SURGICAL HISTORY:        Past Surgical History:  Procedure Laterality Date  . CHOLECYSTECTOMY    . Excision of right cervical node  Right 01/08/2020  . HYSTERECTOMY           MEDICATIONS:  Encounter Medications        Outpatient Encounter Medications as of 02/16/2020  Medication Sig Dispense Refill  . ALPRAZolam (XANAX) 0.25 MG tablet Take 1 tablet (0.25 mg total) by mouth nightly as needed for Sleep for up to 30 days 30 tablet 0  . naproxen sodium (ALEVE) 220 MG tablet Take by mouth     No facility-administered encounter medications on file as of 02/16/2020.       ALLERGIES:   Penicillin   SOCIAL HISTORY:  Social History     Socioeconomic History  . Marital status: Married    Spouse name: Not on file  . Number of children: 10  . Years of education: Not on file  . Highest education level: Not on file  Occupational History  . Not on file   Tobacco Use  . Smoking status: Never Smoker  . Smokeless tobacco: Never Used  Vaping Use  . Vaping Use: Never used  Substance and Sexual Activity  . Alcohol use: No  . Drug use: Never  . Sexual activity: Not on file  Other Topics Concern  . Not on file  Social History Narrative  . Not on file   Social Determinants of Health      Financial Resource Strain:   . Difficulty of Paying Living Expenses:   Food Insecurity:   . Worried About Charity fundraiser in the Last Year:   . Arboriculturist in the Last Year:   Transportation Needs:   . Film/video editor (Medical):   Marland Kitchen Lack of Transportation (Non-Medical):       FAMILY HISTORY:       Family History  Problem Relation Age of Onset  . Stroke Father   . Myocardial Infarction (Heart attack) Brother      GENERAL REVIEW OF SYSTEMS:   General ROS: negative for - chills, fatigue, fever, weight gain or weight loss Allergy and Immunology ROS: negative for - hives  Hematological and Lymphatic ROS: negative for - bleeding problems or bruising, positive for palpable nodes Endocrine ROS: negative for - heat or cold intolerance, hair  changes Respiratory ROS: negative for - cough, shortness of breath or wheezing Cardiovascular ROS: no chest pain or palpitations GI ROS: negative for nausea, vomiting, abdominal pain, diarrhea, constipation Musculoskeletal ROS: negative for - joint swelling or muscle pain Neurological ROS: negative for - confusion, syncope Dermatological ROS: negative for pruritus and rash  PHYSICAL EXAM:     Vitals:   02/16/20 1315  BP: (!) 168/93  Pulse: 95  Temp: 36.4 C (97.5 F)  .  Ht:160 cm (5\' 3" ) Wt:86.6 kg (191 lb) KZL:DJTT surface area is 1.96 meters squared. Body mass index is 33.83 kg/m.Marland Kitchen   GENERAL: Alert, active, oriented x3  HEENT: Pupils equal reactive to light. Extraocular movements are intact. Sclera clear. Palpebral conjunctiva normal red color.Pharynx clear.  NECK:  Supple with no palpable mass and palpable bilateral adenopathy  LUNGS: Sound clear with no rales rhonchi or wheezes.  HEART: Regular rhythm S1 and S2 without murmur.  ABDOMEN: Soft and depressible, nontender with no palpable mass, no hepatomegaly.   EXTREMITIES: Well-developed well-nourished symmetrical with no dependent edema.  NEUROLOGICAL: Awake alert oriented, facial expression symmetrical, moving all extremities.      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  Upon discussion with medical oncology, chemotherapy was recommended.  Patient today comes for discussion of insertion of Port-A-Cath.  I discussed with the patient the surgical technique of introducing the Port-A-Cath.  I discussed with the patient the possible risks of surgery that includes: Bleeding, infection, hematoma, pneumothorax, hemothorax, arteriovenous fistula, pseudoaneurysm, among others.          PLAN:  1. Insertion of Port a Cath [phone], [id number], [id number]) 2. Do not take aspirin 5 days before surgery 3. Contact us if has any question or concern.  Patient verbalized understanding, all questions were answered, and were agreeable with the plan outlined above.   Herbert Pun, MD  Electronically signed by Herbert Pun, MD

## 2020-02-17 NOTE — Patient Instructions (Signed)
Rituximab injection What is this medicine? RITUXIMAB (ri TUX i mab) is a monoclonal antibody. It is used to treat certain types of cancer like non-Hodgkin lymphoma and chronic lymphocytic leukemia. It is also used to treat rheumatoid arthritis, granulomatosis with polyangiitis (or Wegener's granulomatosis), microscopic polyangiitis, and pemphigus vulgaris. This medicine may be used for other purposes; ask your health care provider or pharmacist if you have questions. COMMON BRAND NAME(S): Rituxan, RUXIENCE What should I tell my health care provider before I take this medicine? They need to know if you have any of these conditions:  heart disease  infection (especially a virus infection such as hepatitis B, chickenpox, cold sores, or herpes)  immune system problems  irregular heartbeat  kidney disease  low blood counts, like low white cell, platelet, or red cell counts  lung or breathing disease, like asthma  recently received or scheduled to receive a vaccine  an unusual or allergic reaction to rituximab, other medicines, foods, dyes, or preservatives  pregnant or trying to get pregnant  breast-feeding How should I use this medicine? This medicine is for infusion into a vein. It is administered in a hospital or clinic by a specially trained health care professional. A special MedGuide will be given to you by the pharmacist with each prescription and refill. Be sure to read this information carefully each time. Talk to your pediatrician regarding the use of this medicine in children. This medicine is not approved for use in children. Overdosage: If you think you have taken too much of this medicine contact a poison control center or emergency room at once. NOTE: This medicine is only for you. Do not share this medicine with others. What if I miss a dose? It is important not to miss a dose. Call your doctor or health care professional if you are unable to keep an appointment. What  may interact with this medicine?  cisplatin  live virus vaccines This list may not describe all possible interactions. Give your health care provider a list of all the medicines, herbs, non-prescription drugs, or dietary supplements you use. Also tell them if you smoke, drink alcohol, or use illegal drugs. Some items may interact with your medicine. What should I watch for while using this medicine? Your condition will be monitored carefully while you are receiving this medicine. You may need blood work done while you are taking this medicine. This medicine can cause serious allergic reactions. To reduce your risk you may need to take medicine before treatment with this medicine. Take your medicine as directed. In some patients, this medicine may cause a serious brain infection that may cause death. If you have any problems seeing, thinking, speaking, walking, or standing, tell your healthcare professional right away. If you cannot reach your healthcare professional, urgently seek other source of medical care. Call your doctor or health care professional for advice if you get a fever, chills or sore throat, or other symptoms of a cold or flu. Do not treat yourself. This drug decreases your body's ability to fight infections. Try to avoid being around people who are sick. Do not become pregnant while taking this medicine or for at least 12 months after stopping it. Women should inform their doctor if they wish to become pregnant or think they might be pregnant. There is a potential for serious side effects to an unborn child. Talk to your health care professional or pharmacist for more information. Do not breast-feed an infant while taking this medicine or for at   least 6 months after stopping it. What side effects may I notice from receiving this medicine? Side effects that you should report to your doctor or health care professional as soon as possible:  allergic reactions like skin rash, itching or  hives; swelling of the face, lips, or tongue  breathing problems  chest pain  changes in vision  diarrhea  headache with fever, neck stiffness, sensitivity to light, nausea, or confusion  fast, irregular heartbeat  loss of memory  low blood counts - this medicine may decrease the number of white blood cells, red blood cells and platelets. You may be at increased risk for infections and bleeding.  mouth sores  problems with balance, talking, or walking  redness, blistering, peeling or loosening of the skin, including inside the mouth  signs of infection - fever or chills, cough, sore throat, pain or difficulty passing urine  signs and symptoms of kidney injury like trouble passing urine or change in the amount of urine  signs and symptoms of liver injury like dark yellow or brown urine; general ill feeling or flu-like symptoms; light-colored stools; loss of appetite; nausea; right upper belly pain; unusually weak or tired; yellowing of the eyes or skin  signs and symptoms of low blood pressure like dizziness; feeling faint or lightheaded, falls; unusually weak or tired  stomach pain  swelling of the ankles, feet, hands  unusual bleeding or bruising  vomiting Side effects that usually do not require medical attention (report to your doctor or health care professional if they continue or are bothersome):  headache  joint pain  muscle cramps or muscle pain  nausea  tiredness This list may not describe all possible side effects. Call your doctor for medical advice about side effects. You may report side effects to FDA at 1-800-FDA-1088. Where should I keep my medicine? This drug is given in a hospital or clinic and will not be stored at home. NOTE: This sheet is a summary. It may not cover all possible information. If you have questions about this medicine, talk to your doctor, pharmacist, or health care provider.  2020 Elsevier/Gold Standard (2018-09-24  22:01:36) Bendamustine Injection What is this medicine? BENDAMUSTINE (BEN da MUS teen) is a chemotherapy drug. It is used to treat chronic lymphocytic leukemia and non-Hodgkin lymphoma. This medicine may be used for other purposes; ask your health care provider or pharmacist if you have questions. COMMON BRAND NAME(S): BELRAPZO, BENDEKA, Treanda What should I tell my health care provider before I take this medicine? They need to know if you have any of these conditions:  infection (especially a virus infection such as chickenpox, cold sores, or herpes)  kidney disease  liver disease  an unusual or allergic reaction to bendamustine, mannitol, other medicines, foods, dyes, or preservatives  pregnant or trying to get pregnant  breast-feeding How should I use this medicine? This medicine is for infusion into a vein. It is given by a health care professional in a hospital or clinic setting. Talk to your pediatrician regarding the use of this medicine in children. Special care may be needed. Overdosage: If you think you have taken too much of this medicine contact a poison control center or emergency room at once. NOTE: This medicine is only for you. Do not share this medicine with others. What if I miss a dose? It is important not to miss your dose. Call your doctor or health care professional if you are unable to keep an appointment. What may interact with this   medicine? Do not take this medicine with any of the following medications:  clozapine This medicine may also interact with the following medications:  atazanavir  cimetidine  ciprofloxacin  enoxacin  fluvoxamine  medicines for seizures like carbamazepine and phenobarbital  mexiletine  rifampin  tacrine  thiabendazole  zileuton This list may not describe all possible interactions. Give your health care provider a list of all the medicines, herbs, non-prescription drugs, or dietary supplements you use. Also tell  them if you smoke, drink alcohol, or use illegal drugs. Some items may interact with your medicine. What should I watch for while using this medicine? This drug may make you feel generally unwell. This is not uncommon, as chemotherapy can affect healthy cells as well as cancer cells. Report any side effects. Continue your course of treatment even though you feel ill unless your doctor tells you to stop. You may need blood work done while you are taking this medicine. Call your doctor or healthcare provider for advice if you get a fever, chills or sore throat, or other symptoms of a cold or flu. Do not treat yourself. This drug decreases your body's ability to fight infections. Try to avoid being around people who are sick. This medicine may cause serious skin reactions. They can happen weeks to months after starting the medicine. Contact your healthcare provider right away if you notice fevers or flu-like symptoms with a rash. The rash may be red or purple and then turn into blisters or peeling of the skin. Or, you might notice a red rash with swelling of the face, lips or lymph nodes in your neck or under your arms. This medicine may increase your risk to bruise or bleed. Call your doctor or healthcare provider if you notice any unusual bleeding. Talk to your doctor about your risk of cancer. You may be more at risk for certain types of cancers if you take this medicine. Do not become pregnant while taking this medicine or for at least 6 months after stopping it. Women should inform their doctor if they wish to become pregnant or think they might be pregnant. Men should not father a child while taking this medicine and for at least 3 months after stopping it. There is a potential for serious side effects to an unborn child. Talk to your healthcare provider or pharmacist for more information. Do not breast-feed an infant while taking this medicine or for at least 1 week after stopping it. This medicine may  make it more difficult to father a child. You should talk with your doctor or healthcare provider if you are concerned about your fertility. What side effects may I notice from receiving this medicine? Side effects that you should report to your doctor or health care professional as soon as possible:  allergic reactions like skin rash, itching or hives, swelling of the face, lips, or tongue  low blood counts - this medicine may decrease the number of white blood cells, red blood cells and platelets. You may be at increased risk for infections and bleeding.  rash, fever, and swollen lymph nodes  redness, blistering, peeling, or loosening of the skin, including inside the mouth  signs of infection like fever or chills, cough, sore throat, pain or difficulty passing urine  signs of decreased platelets or bleeding like bruising, pinpoint red spots on the skin, black, tarry stools, blood in the urine  signs of decreased red blood cells like being unusually weak or tired, fainting spells, lightheadedness  signs   and symptoms of kidney injury like trouble passing urine or change in the amount of urine  signs and symptoms of liver injury like dark yellow or brown urine; general ill feeling or flu-like symptoms; light-colored stools; loss of appetite; nausea; right upper belly pain; unusually weak or tired; yellowing of the eyes or skin Side effects that usually do not require medical attention (report to your doctor or health care professional if they continue or are bothersome):  constipation  decreased appetite  diarrhea  headache  mouth sores  nausea, vomiting  tiredness This list may not describe all possible side effects. Call your doctor for medical advice about side effects. You may report side effects to FDA at 1-800-FDA-1088. Where should I keep my medicine? This drug is given in a hospital or clinic and will not be stored at home. NOTE: This sheet is a summary. It may not  cover all possible information. If you have questions about this medicine, talk to your doctor, pharmacist, or health care provider.  2020 Elsevier/Gold Standard (2018-11-04 10:26:46)  

## 2020-02-18 ENCOUNTER — Inpatient Hospital Stay (HOSPITAL_BASED_OUTPATIENT_CLINIC_OR_DEPARTMENT_OTHER): Payer: 59 | Admitting: Oncology

## 2020-02-18 ENCOUNTER — Inpatient Hospital Stay: Payer: 59

## 2020-02-18 ENCOUNTER — Other Ambulatory Visit: Payer: Self-pay

## 2020-02-18 DIAGNOSIS — C8218 Follicular lymphoma grade II, lymph nodes of multiple sites: Secondary | ICD-10-CM

## 2020-02-18 MED ORDER — ACYCLOVIR 400 MG PO TABS
400.0000 mg | ORAL_TABLET | Freq: Two times a day (BID) | ORAL | 6 refills | Status: DC
Start: 2020-02-18 — End: 2020-09-20

## 2020-02-18 MED ORDER — PROCHLORPERAZINE MALEATE 10 MG PO TABS
10.0000 mg | ORAL_TABLET | Freq: Four times a day (QID) | ORAL | 1 refills | Status: DC | PRN
Start: 2020-02-18 — End: 2021-05-19

## 2020-02-18 MED ORDER — ONDANSETRON HCL 4 MG PO TABS: 4.0000 mg | ORAL_TABLET | Freq: Three times a day (TID) | ORAL | 0 refills | Status: DC | PRN

## 2020-02-18 MED ORDER — LIDOCAINE-PRILOCAINE 2.5-2.5 % EX CREA
1.0000 | TOPICAL_CREAM | Freq: Once | CUTANEOUS | 0 refills | Status: DC | PRN
Start: 2020-02-18 — End: 2020-04-04

## 2020-02-18 NOTE — Progress Notes (Signed)
[place]  Telephone:([phone] [phone]  Patient Care Team: Peggye Form, NP as PCP - General (Family Medicine) Herbert Pun, MD as Consulting Physician (General Surgery)   Name of the patient: Anyelin Mogle  [id number]  [birth year]   Date of visit: 02/18/20  Diagnosis-lymphoma  Chief complaint/Reason for visit- Initial Meeting for [facility], preparing for starting chemotherapy  Heme/Onc history:  Oncology History Overview Note  # MAY 2021- Bilateral neck adenopathy right more than left; highly concerning for lymphoproliferative disorder.  Excisional biopsy right neck- [Dr.Cintron];  A. 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   B. 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   # MAY 2021-CBC/CMP normal /  mildly elevated alkaline phosphatase. [PCP; [facility]].  # SURVIVORSHIP:   # GENETICS:   DIAGNOSIS:   STAGE:         ;  GOALS:  CURRENT/MOST RECENT THERAPY :      Follicular lymphoma grade ii, lymph nodes of multiple sites ([place])  01/29/2020 Initial Diagnosis   Follicular lymphoma grade ii, lymph nodes of multiple sites ([place])   02/12/2020 -  Chemotherapy   The patient had dexamethasone (DECADRON) 4 MG tablet, 8 mg, Oral, Daily, 0 of 1 cycle, Start date: --, End date: -- PALONOSETRON HCL INJECTION 0.25 MG/5ML, 0.25 mg, Intravenous,  Once, 0 of 4 cycles bendamustine (BENDEKA) 175 mg in sodium chloride 0.9 % 50 mL (3.0702 mg/mL) chemo infusion, 90 mg/m2, Intravenous,  Once, 0 of 4 cycles riTUXimab-pvvr (RUXIENCE) 700 mg in sodium chloride 0.9 % 250 mL (2.1875 mg/mL) infusion, 375 mg/m2, Intravenous,  Once, 0 of 4 cycles  for chemotherapy treatment.      Interval history-Mrs. Rawlinson is a 64 year old female who presents to chemo care clinic today for initial meeting in preparation for starting  chemotherapy. I introduced the chemo care clinic and we discussed that the role of the clinic is to assist those who are at an increased risk of emergency room visits and/or complications during the course of chemotherapy treatment. We discussed that the increased risk takes into account factors such as age, performance status, and co-morbidities. We also discussed that for some, this might include barriers to care such as not having a primary care provider, lack of insurance/transportation, or not being able to afford medications. We discussed that the goal of the program is to help prevent unplanned ER visits and help reduce complications during chemotherapy. We do this by discussing specific risk factors to each individual and identifying ways that we can help improve these risk factors and reduce barriers to care.   ECOG FS:0 - Asymptomatic  Review of systems- Review of Systems  Constitutional: Negative.  Negative for chills, fever, malaise/fatigue and weight loss.  HENT: Negative for congestion, ear pain and tinnitus.   Eyes: Negative.  Negative for blurred vision and double vision.  Respiratory: Negative.  Negative for cough, sputum production and shortness of breath.   Cardiovascular: Negative.  Negative for chest pain, palpitations and leg swelling.  Gastrointestinal: Negative.  Negative for abdominal pain, constipation, diarrhea, nausea and vomiting.  Genitourinary: Negative for dysuria, frequency and urgency.  Musculoskeletal: Negative for back pain and falls.  Skin: Negative.  Negative for rash.  Neurological: Negative.  Negative for weakness and headaches.  Endo/Heme/Allergies: Negative.  Does not bruise/bleed easily.  Psychiatric/Behavioral: Negative.  Negative for depression.  The patient is not nervous/anxious and does not have insomnia.      Current treatment-monthly bendamustine and Rituxan x6  Allergies  Allergen Reactions  . Penicillins Rash    Past Medical History:    Diagnosis Date  . Anxiety   . GERD (gastroesophageal reflux disease)   . Hypertension     Past Surgical History:  Procedure Laterality Date  . ABDOMINAL HYSTERECTOMY    . APPENDECTOMY    . CHOLECYSTECTOMY     28years ago  . LYMPH GLAND EXCISION Right 01/22/2020   Procedure: CERVICAL LYMPH GLAND EXCISION;  Surgeon: Herbert Pun, MD;  Location: [facility] ORS;  Service: General;  Laterality: Right;  . TONSILLECTOMY      Social History   Socioeconomic History  . Marital status: Married    Spouse name: Not on file  . Number of children: Not on file  . Years of education: Not on file  . Highest education level: Not on file  Occupational History  . Not on file  Tobacco Use  . Smoking status: Never Smoker  . Smokeless tobacco: Never Used  Vaping Use  . Vaping Use: Never used  Substance and Sexual Activity  . Alcohol use: Yes    Comment: occassional  . Drug use: Not Currently  . Sexual activity: Not on file  Other Topics Concern  . Not on file  Social History Narrative   Lives in [place]; [place]. Never smoked; wine rarely. Used in work in [employer]. With husband.    Social Determinants of Health   Financial Resource Strain:   . Difficulty of Paying Living Expenses:   Food Insecurity:   . Worried About Charity fundraiser in the Last Year:   . Arboriculturist in the Last Year:   Transportation Needs:   . Film/video editor (Medical):   Marland Kitchen Lack of Transportation (Non-Medical):   Physical Activity:   . Days of Exercise per Week:   . Minutes of Exercise per Session:   Stress:   . Feeling of Stress :   Social Connections:   . Frequency of Communication with Friends and Family:   . Frequency of Social Gatherings with Friends and Family:   . Attends Religious Services:   . Active Member of Clubs or Organizations:   . Attends Archivist Meetings:   Marland Kitchen Marital Status:   Intimate Partner Violence:   . Fear of Current or Ex-Partner:   . Emotionally  Abused:   Marland Kitchen Physically Abused:   . Sexually Abused:     Family History  Problem Relation Age of Onset  . Cancer Mother        unknown type     Current Outpatient Medications:  .  ALPRAZolam (XANAX) 0.25 MG tablet, Take 0.25-5 mg by mouth at bedtime. , Disp: , Rfl:  .  cetirizine (ZYRTEC) 10 MG tablet, Take 10 mg by mouth daily as needed for allergies.  (Patient not taking: Reported on 02/17/2020), Disp: , Rfl:  .  HYDROcodone-acetaminophen (NORCO/VICODIN) 5-325 MG tablet, Take 1 tablet by mouth every 6 (six) hours as needed for moderate pain. , Disp: , Rfl:   Physical exam: There were no vitals filed for this visit. Physical Exam Constitutional:      Appearance: Normal appearance.  HENT:     Head: Normocephalic and atraumatic.  Eyes:     Pupils: Pupils are equal, round, and reactive to light.  Cardiovascular:     Rate and Rhythm: Normal rate and  regular rhythm.     Heart sounds: Normal heart sounds. No murmur heard.   Pulmonary:     Effort: Pulmonary effort is normal.     Breath sounds: Normal breath sounds. No wheezing.  Abdominal:     General: Bowel sounds are normal. There is no distension.     Palpations: Abdomen is soft.     Tenderness: There is no abdominal tenderness.  Musculoskeletal:        General: Normal range of motion.     Cervical back: Normal range of motion.  Skin:    General: Skin is warm and dry.     Findings: No rash.  Neurological:     Mental Status: She is alert and oriented to person, place, and time.  Psychiatric:        Judgment: Judgment normal.      CMP Latest Ref Rng & Units 01/06/2020  Glucose 65 - 99 mg/dL -  BUN 6 - 24 mg/dL -  Creatinine 0.44 - 1.00 mg/dL 1.20(H)  Sodium 134 - 144 mmol/L -  Potassium 3.5 - 5.2 mmol/L -  Chloride 97 - 108 mmol/L -  CO2 18 - 29 mmol/L -  Calcium 8.7 - 10.2 mg/dL -  Total Protein 6.0 - 8.5 g/dL -  Total Bilirubin 0.0 - 1.2 mg/dL -  Alkaline Phos 39 - 117 IU/L -  AST 0 - 40 IU/L -  ALT 0 - 32 IU/L  -   CBC Latest Ref Rng & Units 05/09/2015  WBC 3.4 - 10.8 x10E3/uL 7.4  Hemoglobin 11.1 - 15.9 g/dL 14.7  Hematocrit 34.0 - 46.6 % 44.6  Platelets 150 - 379 x10E3/uL 193    No images are attached to the encounter.  No results found.   Assessment and plan- Patient is a 64 y.o. female who presents to [facility] for initial meeting in preparation for starting chemotherapy for the treatment of lymphoma.   HPI: She was initially diagnosed after being treated for Pneumonia with antibiotics. She had persistent Bilateral neck adenopathy right more than left; highly concerning for lymphoproliferative disorder.  Excisional biopsy right neck by Dr. Peyton Najjar which was positive for follicular lymphoma. She is not completely symptomatic but Dr. Rogue Bussing believes it is only a matter of time. Discussed beginning Rituxin/bendamustine monthly  X 6 cycles. Scheduled to begin next week.   2. Chemo Care Clinic/High Risk for ER/Hospitalization during chemotherapy- We discussed the role of the chemo care clinic and identified patient specific risk factors. I discussed that patient was identified as high risk primarily based on:  Patient has past medical history positive for: Past Medical History:  Diagnosis Date  . Anxiety   . GERD (gastroesophageal reflux disease)   . Hypertension     Patient has past surgical history positive for: Past Surgical History:  Procedure Laterality Date  . ABDOMINAL HYSTERECTOMY    . APPENDECTOMY    . CHOLECYSTECTOMY     28years ago  . LYMPH GLAND EXCISION Right 01/22/2020   Procedure: CERVICAL LYMPH GLAND EXCISION;  Surgeon: Herbert Pun, MD;  Location: [facility] ORS;  Service: General;  Laterality: Right;  . TONSILLECTOMY      Based on our high risk symptom management report; this patient has a high risk of ED utilization.  The percentage below indicates how "at risk "  this patient based on the factors in this table within one year.   General Risk Score:  1  Values used to calculate this score:   Points  Metrics      0        Age: 46      [facility] Admissions: 1      0        ED Visits: 0      0        Has Chronic Obstructive Pulmonary Disease: No      0        Has Diabetes: No      0        Has Congestive Heart Failure: No      0        Has liver disease: No      0        Has Depression: No      0        Current PCP: Peggye Form, NP      0        Has Medicaid: No    3. We discussed that social determinants of health may have significant impacts on health and outcomes for cancer patients.  Today we discussed specific social determinants of performance status, alcohol use, depression, financial needs, food insecurity, housing, interpersonal violence, social connections, stress, tobacco use, and transportation.    After lengthy discussion the following were identified as areas of need: Has applied for [facility] and is waiting to hear back about. States she already has over $22,000 of medical bills that she needs help paying.   Outpatient services: We discussed options including home based and outpatient services, DME and care program. We discusssed that patients who participate in regular physical activity report fewer negative impacts of cancer and treatments and report less fatigue.   Financial Concerns: We discussed that living with cancer can create tremendous financial burden.  We discussed options for assistance. I asked that if assistance is needed in affording medications or paying bills to please let us know so that we can provide assistance. We discussed options for food including social services, [employer] ($50 every 2 weeks) and onsite food pantry.  We will also notify [facility] to see if cancer center can provide additional support.  Referral to Social work: Introduced Education officer, museum Elease Etienne and the services he can provide such as support with [employer], cell phone and gas vouchers.   Support  groups: We discussed options for support groups at the cancer center. If interested, please notify nurse navigator to enroll. We discussed options for managing stress including healthy eating, exercise as well as participating in no charge counseling services at the cancer center and support groups.  If these are of interest, patient can notify either myself or primary nursing team.We discussed options for management including medications and referral to quit Smart program  Transportation: We discussed options for transportation including acta, paratransit, bus routes, link transit, taxi/uber/lyft, and cancer center [place].  I have notified primary oncology team who will help assist with arranging Lucianne Lei transportation for appointments when/if needed. We also discussed options for transportation on short notice/acute visits.  Palliative care services: We have palliative care services available in the cancer center to discuss goals of care and advanced care planning.  Please let us know if you have any questions or would like to speak to our palliative nurse practitioner.  Symptom Management Clinic: We discussed our symptom management clinic which is available for acute concerns while receiving treatment such as nausea, vomiting or diarrhea.  We can be  reached via telephone at [phone] or through my chart.  We are available for virtual or in person visits on the same day from 830 to 4 PM Monday through Friday. She denies needing specific assistance at this time and She will be followed by Dr. Aletha Halim clinical team.  Plan: Discussed symptom management clinic. Discussed palliative care services. Discussed resources that are available here at the cancer center. Discussed medications and new prescriptions to begin treatment such as anti-nausea or steroids.  Will reach out to [facility] about some financial services that may be available to him.   Disposition: RTC on 03/02/2020 for labs, md assessment and  cycle 1.   Visit Diagnosis 1. Follicular lymphoma grade ii, lymph nodes of multiple sites [facility])     Patient expressed understanding and was in agreement with this plan. She also understands that She can call clinic at any time with any questions, concerns, or complaints.   Greater than 50% was spent in counseling and coordination of care with this patient including but not limited to discussion of the relevant topics above (See A&P) including, but not limited to diagnosis and management of acute and chronic medical conditions.   [facility] [place]  CC: Dr. Rogue Bussing

## 2020-02-18 NOTE — Progress Notes (Signed)
[place] CONSULT NOTE  Patient Care Team: Peggye Form, NP as PCP - General (Family Medicine) Herbert Pun, MD as Consulting Physician (General Surgery)  CHIEF COMPLAINTS/PURPOSE OF CONSULTATION:   Oncology History Overview Note  # MAY 2021- Bilateral neck adenopathy right more than left; highly concerning for lymphoproliferative disorder.  Excisional biopsy right neck- [Dr.Cintron];  A. 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   B. 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   # MAY 2021-CBC/CMP normal /  mildly elevated alkaline phosphatase. [PCP; [facility]].  # SURVIVORSHIP:   # GENETICS:   DIAGNOSIS:   STAGE:         ;  GOALS:  CURRENT/MOST RECENT THERAPY :      Follicular lymphoma grade ii, lymph nodes of multiple sites ([place])  01/29/2020 Initial Diagnosis   Follicular lymphoma grade ii, lymph nodes of multiple sites ([place])   02/12/2020 -  Chemotherapy   The patient had dexamethasone (DECADRON) 4 MG tablet, 8 mg, Oral, Daily, 0 of 1 cycle, Start date: --, End date: -- PALONOSETRON HCL INJECTION 0.25 MG/5ML, 0.25 mg, Intravenous,  Once, 0 of 4 cycles bendamustine (BENDEKA) 175 mg in sodium chloride 0.9 % 50 mL (3.0702 mg/mL) chemo infusion, 90 mg/m2, Intravenous,  Once, 0 of 4 cycles riTUXimab-pvvr (RUXIENCE) 700 mg in sodium chloride 0.9 % 250 mL (2.1875 mg/mL) infusion, 375 mg/m2, Intravenous,  Once, 0 of 4 cycles  for chemotherapy treatment.     HISTORY OF PRESENTING ILLNESS:  Danielle Lewis 64 y.o.  female newly diagnosed follicular lymphoma grade 1-2 is here for follow-up.  Patient was recently evaluated by her surgeon-noted concerns of disease related symptoms.  Patient complains of pain in the neck at the site of biopsy and also around the chest wall.  Also complains of profuse night sweats-almost every night over the last 1 week or so.   Patient in general has history of hot flashes sweats related to menopause.  Chronic fatigue not any worse.  Appetite is good.  No weight loss.  Review of Systems  Constitutional: Positive for diaphoresis and malaise/fatigue. Negative for chills, fever and weight loss.  HENT: Negative for nosebleeds and sore throat.   Eyes: Negative for double vision.  Respiratory: Negative for cough, hemoptysis, sputum production, shortness of breath and wheezing.   Cardiovascular: Negative for chest pain, palpitations, orthopnea and leg swelling.  Gastrointestinal: Negative for abdominal pain, blood in stool, constipation, diarrhea, heartburn, melena, nausea and vomiting.  Genitourinary: Negative for dysuria, frequency and urgency.  Musculoskeletal: Negative for back pain and joint pain.  Skin: Negative.  Negative for itching and rash.  Neurological: Negative for dizziness, tingling, focal weakness, weakness and headaches.  Endo/Heme/Allergies: Does not bruise/bleed easily.  Psychiatric/Behavioral: Negative for depression. The patient is not nervous/anxious and does not have insomnia.      MEDICAL HISTORY:  Past Medical History:  Diagnosis Date  . Anxiety   . GERD (gastroesophageal reflux disease)   . Hypertension     SURGICAL HISTORY: Past Surgical History:  Procedure Laterality Date  . ABDOMINAL HYSTERECTOMY    . APPENDECTOMY    . CHOLECYSTECTOMY     28years ago  . LYMPH GLAND EXCISION Right 01/22/2020   Procedure: CERVICAL LYMPH GLAND EXCISION;  Surgeon: Herbert Pun, MD;  Location: [facility] ORS;  Service: General;  Laterality: Right;  . TONSILLECTOMY      SOCIAL  HISTORY: Social History   Socioeconomic History  . Marital status: Married    Spouse name: Not on file  . Number of children: Not on file  . Years of education: Not on file  . Highest education level: Not on file  Occupational History  . Not on file  Tobacco Use  . Smoking status: Never Smoker  . Smokeless tobacco:  Never Used  Vaping Use  . Vaping Use: Never used  Substance and Sexual Activity  . Alcohol use: Yes    Comment: occassional  . Drug use: Not Currently  . Sexual activity: Not on file  Other Topics Concern  . Not on file  Social History Narrative   Lives in [place]; [place]. Never smoked; wine rarely. Used in work in [employer]. With husband.    Social Determinants of Health   Financial Resource Strain:   . Difficulty of Paying Living Expenses:   Food Insecurity:   . Worried About Charity fundraiser in the Last Year:   . Arboriculturist in the Last Year:   Transportation Needs:   . Film/video editor (Medical):   Marland Kitchen Lack of Transportation (Non-Medical):   Physical Activity:   . Days of Exercise per Week:   . Minutes of Exercise per Session:   Stress:   . Feeling of Stress :   Social Connections:   . Frequency of Communication with Friends and Family:   . Frequency of Social Gatherings with Friends and Family:   . Attends Religious Services:   . Active Member of Clubs or Organizations:   . Attends Archivist Meetings:   Marland Kitchen Marital Status:   Intimate Partner Violence:   . Fear of Current or Ex-Partner:   . Emotionally Abused:   Marland Kitchen Physically Abused:   . Sexually Abused:     FAMILY HISTORY: Family History  Problem Relation Age of Onset  . Cancer Mother        unknown type    ALLERGIES:  is allergic to penicillins.  MEDICATIONS:  Current Outpatient Medications  Medication Sig Dispense Refill  . ALPRAZolam (XANAX) 0.25 MG tablet Take 0.25-5 mg by mouth at bedtime.     . cetirizine (ZYRTEC) 10 MG tablet Take 10 mg by mouth daily as needed for allergies.  (Patient not taking: Reported on 02/17/2020)    . acyclovir (ZOVIRAX) 400 MG tablet Take 1 tablet (400 mg total) by mouth 2 (two) times daily. One pill a day [to prevent shingles] 60 tablet 6  . HYDROcodone-acetaminophen (NORCO/VICODIN) 5-325 MG tablet Take 1 tablet by mouth every 6 (six) hours as  needed for moderate pain.     Marland Kitchen lidocaine-prilocaine (EMLA) cream Apply 1 application topically once as needed for up to 1 dose. Apply the cream generously to the port site 45-60 mins prior to access. 30 g 0  . ondansetron (ZOFRAN) 4 MG tablet Take 1 tablet (4 mg total) by mouth every 8 (eight) hours as needed for nausea or vomiting. 20 tablet 0  . prochlorperazine (COMPAZINE) 10 MG tablet Take 1 tablet (10 mg total) by mouth every 6 (six) hours as needed for nausea or vomiting. 40 tablet 1   No current facility-administered medications for this visit.      Marland Kitchen  PHYSICAL EXAMINATION: ECOG PERFORMANCE STATUS: 0 - Asymptomatic  Vitals:   02/12/20 0820  BP: (!) 164/106  Pulse: 86  Resp: 16  Temp: 98 F (36.7 C)  SpO2: 100%   Filed Weights  02/12/20 0820  Weight: 192 lb (87.1 kg)    Physical Exam Constitutional:      Comments: Walk independently.  Accompanied by her daughter.  HENT:     Head: Normocephalic and atraumatic.     Mouth/Throat:     Pharynx: No oropharyngeal exudate.  Eyes:     Pupils: Pupils are equal, round, and reactive to light.  Neck:     Comments: 4-5 cm bulky lymph nodes noted on the right cervical chain; mild adenopathy noted on the left side. Cardiovascular:     Rate and Rhythm: Normal rate and regular rhythm.  Pulmonary:     Effort: Pulmonary effort is normal. No respiratory distress.     Breath sounds: Normal breath sounds. No wheezing.  Abdominal:     General: Bowel sounds are normal. There is no distension.     Palpations: Abdomen is soft. There is no mass.     Tenderness: There is no abdominal tenderness. There is no guarding or rebound.  Musculoskeletal:        General: No tenderness. Normal range of motion.     Cervical back: Normal range of motion and neck supple.  Skin:    General: Skin is warm.  Neurological:     Mental Status: She is alert and oriented to person, place, and time.  Psychiatric:        Mood and Affect: Affect normal.       LABORATORY DATA:  I have reviewed the data as listed Lab Results  Component Value Date   WBC 7.4 05/09/2015   HGB 14.7 05/09/2015   HCT 44.6 05/09/2015   MCV 92 05/09/2015   PLT 193 05/09/2015   Recent Labs    01/06/20 1506  CREATININE 1.20*    RADIOGRAPHIC STUDIES: I have personally reviewed the radiological images as listed and agreed with the findings in the report. No results found.  ASSESSMENT & PLAN:   Follicular lymphoma grade ii, lymph nodes of multiple sites [facility]) #Follicle lymphoma grade 1-2; above and below the diaphragm; at least stage III [no bone marrow biopsy]. 18th, May 2021-PET scan lymphadenopathy bulky right neck; left neck; abdominal/retroperitoneal mesenteric and pelvic.  Maximum SUV 11.   #Patient noted to have symptomatic disease with night sweats-new onset the last 1 to 2 weeks; also given bulky adenopathy I think it is reasonable to get started on chemotherapy.  Recommend-bendamustine Rituxan on a monthly basis x6.  Discussed treatments are usually to control the disease; response rates are about 90%; and the median PFS is about 60 months.  Maintenance could be considered.  Discussed the potential side effects including but not limited to-increasing fatigue, nausea vomiting, diarrhea,  sores in the mouth, increase risk of infection.  We will send prescription for antiemetics.  Also sent prescription for EMLA cream.  Discussed with Dr. Peyton Najjar would recommend port placement.  #Recommend shingles prophylaxis.  # DISPOSITION:  # chemo education # July 7th; MD;labs- cbc/cmp; LDH; Rituxan-Benda; D-2-Benda- Dr.B  Cc; Dr.Cintron/[place].    All questions were answered. The patient knows to call the clinic with any problems, questions or concerns.    Cammie Sickle, MD 02/18/2020 4:32 PM

## 2020-02-19 ENCOUNTER — Other Ambulatory Visit: Payer: Self-pay

## 2020-02-19 ENCOUNTER — Encounter
Admission: RE | Admit: 2020-02-19 | Discharge: 2020-02-19 | Disposition: A | Discharge status: Home / Self Care | Payer: 59 | Source: Ambulatory Visit | Attending: General Surgery | Admitting: General Surgery

## 2020-02-19 ENCOUNTER — Encounter: Payer: Self-pay | Admitting: General Surgery

## 2020-02-19 NOTE — Patient Instructions (Signed)
Your procedure is scheduled on: 02/24/20 Report to [facility]. To find out your arrival time please call [phone] between 1PM - 3PM on 02/23/20.  Remember: Instructions that are not followed completely may result in serious medical risk, up to and including death, or upon the discretion of your surgeon and anesthesiologist your surgery may need to be rescheduled.     _X__ 1. Do not eat food after midnight the night before your procedure.                 No gum chewing or hard candies. You may drink clear liquids up to 2 hours                 before you are scheduled to arrive for your surgery- DO not drink clear                 liquids within 2 hours of the start of your surgery.                 Clear Liquids include:  water, apple juice without pulp, clear carbohydrate                 drink such as Clearfast or Gatorade, Black Coffee or Tea (Do not add                 anything to coffee or tea). Diabetics water only  __X__2.  On the morning of surgery brush your teeth with toothpaste and water, you                 may rinse your mouth with mouthwash if you wish.  Do not swallow any              toothpaste of mouthwash.     _X__ 3.  No Alcohol for 24 hours before or after surgery.   _X__ 4.  Do Not Smoke or use e-cigarettes For 24 Hours Prior to Your Surgery.                 Do not use any chewable tobacco products for at least 6 hours prior to                 surgery.  ____  5.  Bring all medications with you on the day of surgery if instructed.   __X__  6.  Notify your doctor if there is any change in your medical condition      (cold, fever, infections).     Do not wear jewelry, make-up, hairpins, clips or nail polish. Do not wear lotions, powders, or perfumes.  Do not shave 48 hours prior to surgery. Men may shave face and neck. Do not bring valuables to the hospital.    [facility] is not responsible for any belongings or  valuables.  Contacts, dentures/partials or body piercings may not be worn into surgery. Bring a case for your contacts, glasses or hearing aids, a denture cup will be supplied. Leave your suitcase in the car. After surgery it may be brought to your room. For patients admitted to the hospital, discharge time is determined by your treatment team.   Patients discharged the day of surgery will not be allowed to drive home.   Please read over the following fact sheets that you were given:   MRSA Information  __X__ Take these medicines the morning of surgery with A SIP OF WATER:  1. NONE  2.   3.   4.  5.  6.  ____ Fleet Enema (as directed)   __X__ Use CHG Soap/SAGE wipes as directed  ____ Use inhalers on the day of surgery  ____ Stop metformin/Janumet/Farxiga 2 days prior to surgery    ____ Take 1/2 of usual insulin dose the night before surgery. No insulin the morning          of surgery.   ____ Stop Blood Thinners Coumadin/Plavix/Xarelto/Pleta/Pradaxa/Eliquis/Effient/Aspirin  on   Or contact your Surgeon, Cardiologist or Medical Doctor regarding  ability to stop your blood thinners  __X__ Stop Anti-inflammatories 7 days before surgery such as Advil, Ibuprofen, Motrin,  BC or Goodies Powder, Naprosyn, Naproxen, Aleve, Aspirin    __X__ Stop all herbal supplements, fish oil or vitamin E until after surgery.    ____ Bring C-Pap to the hospital.

## 2020-02-22 ENCOUNTER — Other Ambulatory Visit
Admission: RE | Admit: 2020-02-22 | Discharge: 2020-02-22 | Disposition: A | Discharge status: Home / Self Care | Payer: 59 | Source: Ambulatory Visit | Attending: General Surgery | Admitting: General Surgery

## 2020-02-22 ENCOUNTER — Other Ambulatory Visit: Payer: Self-pay

## 2020-02-22 DIAGNOSIS — Z20822 Contact with and (suspected) exposure to covid-19: Secondary | ICD-10-CM | POA: Insufficient documentation

## 2020-02-22 DIAGNOSIS — Z01812 Encounter for preprocedural laboratory examination: Secondary | ICD-10-CM | POA: Diagnosis present

## 2020-02-22 LAB — SARS CORONAVIRUS 2 (TAT 6-24 HRS): SARS Coronavirus 2: NEGATIVE

## 2020-02-24 ENCOUNTER — Encounter: Payer: Self-pay | Admitting: General Surgery

## 2020-02-24 ENCOUNTER — Other Ambulatory Visit: Payer: Self-pay

## 2020-02-24 ENCOUNTER — Ambulatory Visit: Payer: 59

## 2020-02-24 ENCOUNTER — Ambulatory Visit: Payer: 59 | Admitting: Anesthesiology

## 2020-02-24 ENCOUNTER — Encounter
Admission: RE | Disposition: A | Discharge status: Home / Self Care | Payer: Self-pay | Source: Home / Self Care | Attending: General Surgery

## 2020-02-24 ENCOUNTER — Encounter: Payer: Self-pay | Admitting: Internal Medicine

## 2020-02-24 ENCOUNTER — Ambulatory Visit
Admission: RE | Admit: 2020-02-24 | Discharge: 2020-02-24 | Disposition: A | Discharge status: Home / Self Care | Payer: 59 | Attending: General Surgery | Admitting: General Surgery

## 2020-02-24 DIAGNOSIS — I10 Essential (primary) hypertension: Secondary | ICD-10-CM | POA: Insufficient documentation

## 2020-02-24 DIAGNOSIS — Z791 Long term (current) use of non-steroidal anti-inflammatories (NSAID): Secondary | ICD-10-CM | POA: Insufficient documentation

## 2020-02-24 DIAGNOSIS — Z880 Allergy status to penicillin: Secondary | ICD-10-CM | POA: Insufficient documentation

## 2020-02-24 DIAGNOSIS — F419 Anxiety disorder, unspecified: Secondary | ICD-10-CM | POA: Diagnosis not present

## 2020-02-24 DIAGNOSIS — C8590 Non-Hodgkin lymphoma, unspecified, unspecified site: Secondary | ICD-10-CM | POA: Diagnosis present

## 2020-02-24 DIAGNOSIS — Z8673 Personal history of transient ischemic attack (TIA), and cerebral infarction without residual deficits: Secondary | ICD-10-CM | POA: Insufficient documentation

## 2020-02-24 DIAGNOSIS — C8211 Follicular lymphoma grade II, lymph nodes of head, face, and neck: Secondary | ICD-10-CM | POA: Insufficient documentation

## 2020-02-24 DIAGNOSIS — Z95828 Presence of other vascular implants and grafts: Secondary | ICD-10-CM

## 2020-02-24 HISTORY — PX: PORTACATH PLACEMENT: SHX2246

## 2020-02-24 LAB — SURGICAL PATHOLOGY

## 2020-02-24 IMAGING — DX DG CHEST 1V PORT
1 series · 1 of 1 positions shown · non-contrast
Comparison: None.

CLINICAL DATA: Status post Port-A-Cath placement

EXAM:
PORTABLE CHEST 1 VIEW

[chest ap]
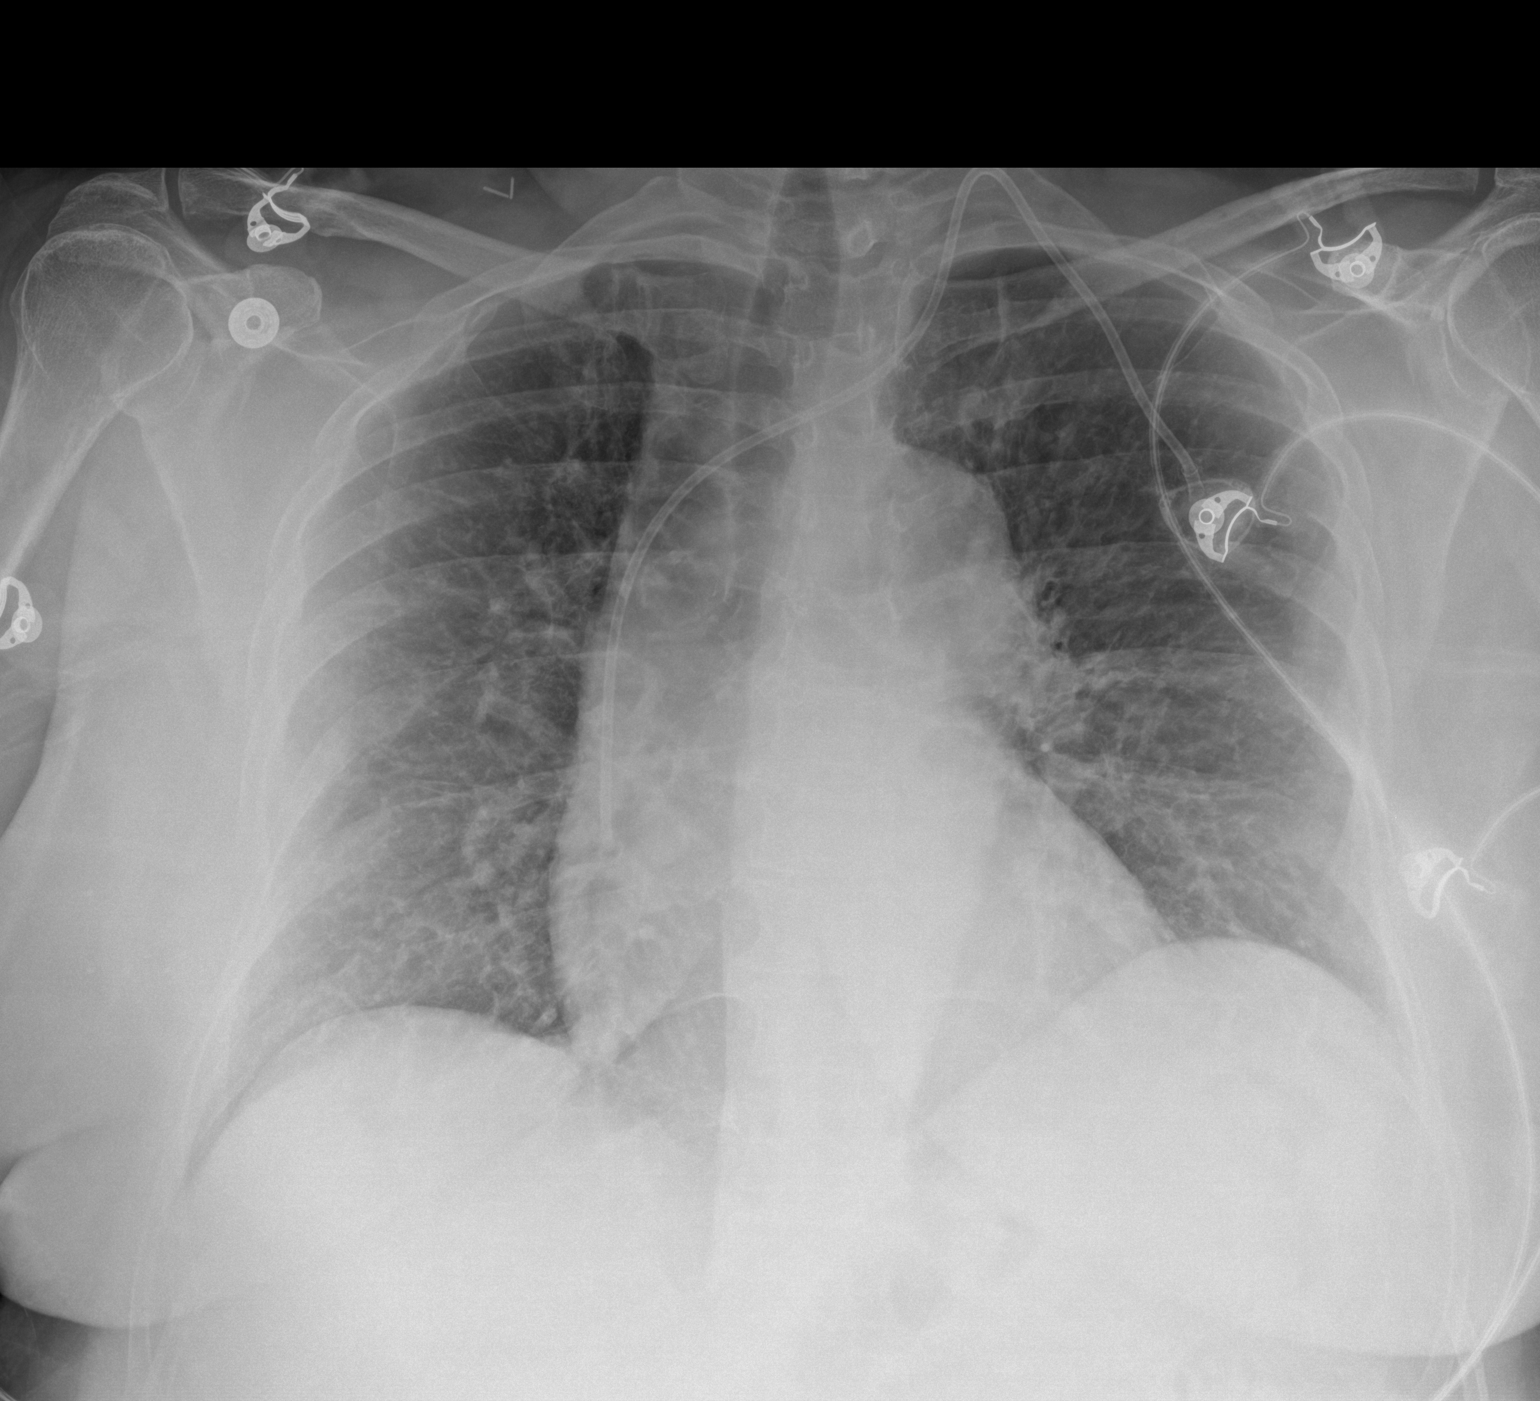

[1 of 1 positions shown; findings below may reference images not displayed]

FINDINGS: There is mild bilateral interstitial thickening likely chronic.
There is no focal consolidation. There is no pleural effusion or
pneumothorax. The heart and mediastinal contours are unremarkable.
There is a left-sided Port-A-Cath in satisfactory position with the
tip projecting over the SVC.

There is no acute osseous abnormality.
IMPRESSION: Left-sided Port-A-Cath in satisfactory position.

## 2020-02-24 SURGERY — INSERTION, TUNNELED CENTRAL VENOUS DEVICE, WITH PORT
Anesthesia: General | Site: Chest

## 2020-02-24 MED ORDER — OXYCODONE HCL 5 MG PO TABS: 5.0000 mg | ORAL_TABLET | Freq: Once | ORAL | Status: DC | PRN

## 2020-02-24 MED ORDER — CHLORHEXIDINE GLUCONATE 0.12 % MT SOLN
OROMUCOSAL | Status: AC
  Administered 2020-02-24: 15 mL via OROMUCOSAL
  Filled 2020-02-24: qty 15

## 2020-02-24 MED ORDER — SODIUM CHLORIDE 0.9 % IV SOLN
INTRAVENOUS | Status: DC | PRN
  Administered 2020-02-24: 10 mL via INTRAMUSCULAR

## 2020-02-24 MED ORDER — PROPOFOL 500 MG/50ML IV EMUL
INTRAVENOUS | Status: DC | PRN
  Administered 2020-02-24: 100 ug/kg/min via INTRAVENOUS

## 2020-02-24 MED ORDER — FENTANYL CITRATE (PF) 100 MCG/2ML IJ SOLN
INTRAMUSCULAR | Status: AC
  Filled 2020-02-24: qty 2

## 2020-02-24 MED ORDER — HYDROCODONE-ACETAMINOPHEN 5-325 MG PO TABS: 1.0000 | ORAL_TABLET | ORAL | 0 refills | Status: AC | PRN

## 2020-02-24 MED ORDER — ONDANSETRON HCL 4 MG/2ML IJ SOLN
INTRAMUSCULAR | Status: DC | PRN
  Administered 2020-02-24: 4 mg via INTRAVENOUS

## 2020-02-24 MED ORDER — HYDROCODONE-ACETAMINOPHEN 5-325 MG PO TABS
ORAL_TABLET | ORAL | Status: AC
  Administered 2020-02-24: 1 via ORAL
  Filled 2020-02-24: qty 1

## 2020-02-24 MED ORDER — OXYCODONE HCL 5 MG/5ML PO SOLN: 5.0000 mg | Freq: Once | ORAL | Status: DC | PRN

## 2020-02-24 MED ORDER — PROPOFOL 500 MG/50ML IV EMUL
INTRAVENOUS | Status: AC
  Filled 2020-02-24: qty 50

## 2020-02-24 MED ORDER — LACTATED RINGERS IV SOLN: INTRAVENOUS | Status: DC | PRN

## 2020-02-24 MED ORDER — LACTATED RINGERS IV SOLN: INTRAVENOUS | Status: DC

## 2020-02-24 MED ORDER — HEPARIN SODIUM (PORCINE) 5000 UNIT/ML IJ SOLN
INTRAMUSCULAR | Status: AC
  Filled 2020-02-24: qty 1

## 2020-02-24 MED ORDER — ORAL CARE MOUTH RINSE: 15.0000 mL | Freq: Once | OROMUCOSAL | Status: AC

## 2020-02-24 MED ORDER — SODIUM CHLORIDE (PF) 0.9 % IJ SOLN
INTRAMUSCULAR | Status: AC
  Filled 2020-02-24: qty 50

## 2020-02-24 MED ORDER — MIDAZOLAM HCL 2 MG/2ML IJ SOLN
INTRAMUSCULAR | Status: DC | PRN
  Administered 2020-02-24: 2 mg via INTRAVENOUS

## 2020-02-24 MED ORDER — FAMOTIDINE 20 MG PO TABS: 20.0000 mg | ORAL_TABLET | Freq: Once | ORAL | Status: AC

## 2020-02-24 MED ORDER — HYDROCODONE-ACETAMINOPHEN 5-325 MG PO TABS: 1.0000 | ORAL_TABLET | Freq: Once | ORAL | Status: AC

## 2020-02-24 MED ORDER — CHLORHEXIDINE GLUCONATE 0.12 % MT SOLN: 15.0000 mL | Freq: Once | OROMUCOSAL | Status: AC

## 2020-02-24 MED ORDER — CLINDAMYCIN PHOSPHATE 900 MG/50ML IV SOLN
INTRAVENOUS | Status: AC
  Filled 2020-02-24: qty 50

## 2020-02-24 MED ORDER — BUPIVACAINE-EPINEPHRINE (PF) 0.25% -1:200000 IJ SOLN
INTRAMUSCULAR | Status: DC | PRN
  Administered 2020-02-24: 27 mL

## 2020-02-24 MED ORDER — FAMOTIDINE 20 MG PO TABS
ORAL_TABLET | ORAL | Status: AC
  Administered 2020-02-24: 20 mg via ORAL
  Filled 2020-02-24: qty 1

## 2020-02-24 MED ORDER — CLINDAMYCIN PHOSPHATE 900 MG/50ML IV SOLN
900.0000 mg | INTRAVENOUS | Status: AC
  Administered 2020-02-24: 900 mg via INTRAVENOUS

## 2020-02-24 MED ORDER — BUPIVACAINE-EPINEPHRINE (PF) 0.25% -1:200000 IJ SOLN
INTRAMUSCULAR | Status: AC
  Filled 2020-02-24: qty 30

## 2020-02-24 MED ORDER — MIDAZOLAM HCL 2 MG/2ML IJ SOLN
INTRAMUSCULAR | Status: AC
  Filled 2020-02-24: qty 2

## 2020-02-24 MED ORDER — ONDANSETRON HCL 4 MG/2ML IJ SOLN: 4.0000 mg | Freq: Once | INTRAMUSCULAR | Status: DC | PRN

## 2020-02-24 MED ORDER — FENTANYL CITRATE (PF) 100 MCG/2ML IJ SOLN
INTRAMUSCULAR | Status: DC | PRN
  Administered 2020-02-24 (×2): 50 ug via INTRAVENOUS

## 2020-02-24 MED ORDER — FENTANYL CITRATE (PF) 100 MCG/2ML IJ SOLN: 25.0000 ug | INTRAMUSCULAR | Status: DC | PRN

## 2020-02-24 SURGICAL SUPPLY — 34 items
BAG DECANTER FOR FLEXI CONT (MISCELLANEOUS) ×3 IMPLANT
BLADE SURG SZ11 CARB STEEL (BLADE) ×3 IMPLANT
BOOT SUTURE AID YELLOW STND (SUTURE) ×3 IMPLANT
CANISTER SUCT 1200ML W/VALVE (MISCELLANEOUS) ×3 IMPLANT
CHLORAPREP W/TINT 26 (MISCELLANEOUS) ×3 IMPLANT
COVER LIGHT HANDLE STERIS (MISCELLANEOUS) ×6 IMPLANT
COVER WAND RF STERILE (DRAPES) ×3 IMPLANT
DERMABOND ADVANCED (GAUZE/BANDAGES/DRESSINGS) ×2
DERMABOND ADVANCED .7 DNX12 (GAUZE/BANDAGES/DRESSINGS) ×1 IMPLANT
DRAPE C-ARM XRAY 36X54 (DRAPES) ×3 IMPLANT
ELECT REM PT RETURN 9FT ADLT (ELECTROSURGICAL) ×3
ELECTRODE REM PT RTRN 9FT ADLT (ELECTROSURGICAL) ×1 IMPLANT
GLOVE BIO SURGEON STRL SZ 6.5 (GLOVE) ×2 IMPLANT
GLOVE BIO SURGEONS STRL SZ 6.5 (GLOVE) ×1
GLOVE BIOGEL PI IND STRL 6.5 (GLOVE) ×1 IMPLANT
GLOVE BIOGEL PI INDICATOR 6.5 (GLOVE) ×2
GOWN STRL REUS W/ TWL LRG LVL3 (GOWN DISPOSABLE) ×3 IMPLANT
GOWN STRL REUS W/TWL LRG LVL3 (GOWN DISPOSABLE) ×6
IV NS 500ML (IV SOLUTION) ×2
IV NS 500ML BAXH (IV SOLUTION) ×1 IMPLANT
KIT PORT POWER 8FR ISP CVUE (Port) ×3 IMPLANT
KIT TURNOVER KIT A (KITS) ×3 IMPLANT
LABEL OR SOLS (LABEL) ×3 IMPLANT
NEEDLE FILTER BLUNT 18X 1/2SAF (NEEDLE) ×2
NEEDLE FILTER BLUNT 18X1 1/2 (NEEDLE) ×1 IMPLANT
PACK PORT-A-CATH (MISCELLANEOUS) ×3 IMPLANT
SUT MNCRL AB 4-0 PS2 18 (SUTURE) ×3 IMPLANT
SUT PROLENE 2 0 FS (SUTURE) ×3 IMPLANT
SUT VIC AB 2-0 SH 27 (SUTURE) ×2
SUT VIC AB 2-0 SH 27XBRD (SUTURE) ×1 IMPLANT
SUT VIC AB 3-0 SH 27 (SUTURE) ×2
SUT VIC AB 3-0 SH 27X BRD (SUTURE) ×1 IMPLANT
SYR 10ML LL (SYRINGE) ×3 IMPLANT
SYR 3ML LL SCALE MARK (SYRINGE) ×3 IMPLANT

## 2020-02-24 NOTE — Anesthesia Preprocedure Evaluation (Addendum)
Anesthesia Evaluation  Patient identified by MRN, date of birth, ID band Patient awake    Reviewed: Allergy & Precautions, H&P , NPO status , Patient's Chart, lab work & pertinent test results  Airway Mallampati: II  TM Distance: >3 FB     Dental  (+) Teeth Intact   Pulmonary neg pulmonary ROS,    breath sounds clear to auscultation       Cardiovascular hypertension,  Rhythm:regular Rate:Normal     Neuro/Psych Anxiety negative neurological ROS  negative psych ROS   GI/Hepatic Neg liver ROS, GERD  Controlled,  Endo/Other  negative endocrine ROS  Renal/GU negative Renal ROS  negative genitourinary   Musculoskeletal   Abdominal   Peds  Hematology negative hematology ROS (+)   Anesthesia Other Findings Past Medical History: No date: Anxiety No date: GERD (gastroesophageal reflux disease) No date: Hypertension  Past Surgical History: No date: ABDOMINAL HYSTERECTOMY No date: APPENDECTOMY No date: CHOLECYSTECTOMY     Comment:  28years ago 01/22/2020: LYMPH GLAND EXCISION; Right     Comment:  Procedure: CERVICAL LYMPH GLAND EXCISION;  Surgeon:               Herbert Pun, MD;  Location: [facility] ORS;  Service:              General;  Laterality: Right; No date: TONSILLECTOMY  BMI    Body Mass Index: 33.83 kg/m      Reproductive/Obstetrics negative OB ROS                            Anesthesia Physical Anesthesia Plan  ASA: II  Anesthesia Plan: General   Post-op Pain Management:    Induction:   PONV Risk Score and Plan: Propofol infusion and TIVA  Airway Management Planned: Natural Airway and Simple Face Mask  Additional Equipment:   Intra-op Plan:   Post-operative Plan:   Informed Consent: I have reviewed the patients History and Physical, chart, labs and discussed the procedure including the risks, benefits and alternatives for the proposed anesthesia with the patient  or authorized representative who has indicated his/her understanding and acceptance.     Dental Advisory Given  Plan Discussed with: Anesthesiologist  Anesthesia Plan Comments:         Anesthesia Quick Evaluation

## 2020-02-24 NOTE — Transfer of Care (Signed)
Immediate Anesthesia Transfer of Care Note  Patient: Danielle Lewis  Procedure(s) Performed: INSERTION PORT-A-CATH (N/A Chest)  Patient Location: PACU  Anesthesia Type:General  Level of Consciousness: sedated  Airway & Oxygen Therapy: Patient Spontanous Breathing and Patient connected to face mask oxygen  Post-op Assessment: Report given to RN and Post -op Vital signs reviewed and stable  Post vital signs: Reviewed and stable  Last Vitals:  Vitals Value Taken Time  BP    Temp    Pulse 86 02/24/20 0853  Resp 19 02/24/20 0853  SpO2 95 % 02/24/20 0853  Vitals shown include unvalidated device data.  Last Pain:  Vitals:   02/24/20 0618  TempSrc: Oral  PainSc: 0-No pain         Complications: No complications documented.

## 2020-02-24 NOTE — Op Note (Signed)
SURGICAL PROCEDURE REPORT  DATE OF PROCEDURE: 02/24/2020   SURGEON: Dr. 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  PROCEDURE(S): (cpt: 07121) 1.) Percutaneous access of Left internal jugular vein under ultrasound guidance 2.) Insertion of tunneled Left internal jugular central venous catheter with subcutaneous port  INTRAOPERATIVE FINDINGS: Patent easily compressible Left internal jugular vein with appropriate respiratory variations and well-secured tunneled central venous catheter with subcutaneous port at completion of the procedure  ESTIMATED BLOOD LOSS: Minimal (<20 mL)   SPECIMENS: None   IMPLANTS: 105F tunneled Bard PowerPort central venous catheter with subcutaneous port  DRAINS: None   COMPLICATIONS: None apparent   CONDITION AT COMPLETION: Hemodynamically stable, awake   DISPOSITION: PACU   INDICATION(S) FOR PROCEDURE:  Patient is a 64 y.o. female who presented with advanced lymphoma cancer requiring durable central venous access for chemotherapy. All risks, benefits, and alternatives to above elective procedures were discussed with the patient, who elected to proceed, and informed consent was accordingly obtained at that time.  DETAILS OF PROCEDURE:  Patient was brought to the operative suite and appropriately identified. In Trendelenburg position, Left IJ venous access site was prepped and draped in the usual sterile fashion, and following a brief timeout, percutaneous Left IJ venous access was obtained under ultrasound guidance using Seldinger technique, by which local anesthetic was injected over the Left IJ vein, and access needle was inserted under direct ultrasound visualization into the Left IJ vein, through which soft guidewire was  advanced, over which access needle was withdrawn. Guidewire was secured, attention was directed to injection of local anesthetic along the planned tunnel site, 2-3 cm transverse Left chest incision was made and confirmed to accommodate the subcutaneous port, and flushed catheter was tunneled retrograde from the port site over the Left chest to the Left IJ access site with the attached port well-secured to the catheter and within the subcutaneous pocket. Insertion sheath was advanced over the guidewire, which was withdrawn along with the insertion sheath dilator. The catheter was introduced through the sheath and left on the [place] junction under fluoro guidance and catheter cut to desire lenght. Catheter connected to port and fixed to the pocket on two side to avoid twisting. Port was confirmed to withdraw blood and flush easily, after which concentrated heparin was instilled into the port and catheter. Dermis at the subcutaneous pocket was re-approximated using buried interrupted 3-0 Vicryl suture, and 4-0 Monocryl suture was used to re-approximate skin at the insertion and subcutaneous port sites in running subcuticular fashion for the subcutaneous port and buried interrupted fashion for the insertion site. Skin was cleaned, dried, and sterile skin glue was applied. Patient was then safely transferred to PACU for a chest x-ray. Ultrasound images are available on paper chart and Fluoroscopy guidance images are available in Epic.

## 2020-02-24 NOTE — Discharge Instructions (Signed)
  Diet: Resume home heart healthy regular diet.   Activity: Increase activity as tolerated. Light activity and walking are encouraged. Do not drive or drink alcohol if taking narcotic pain medications.  Wound care: Remove dressing tomorrow. Once dressing removed, may shower with soapy water and pat dry (do not rub incisions), but no baths or submerging incision underwater until follow-up. (no swimming)   Medications: Resume all home medications. For mild to moderate pain: acetaminophen (Tylenol) or ibuprofen (if no kidney disease). Combining Tylenol with alcohol can substantially increase your risk of causing liver disease. Narcotic pain medications, if prescribed, can be used for severe pain, though may cause nausea, constipation, and drowsiness. Do not combine Tylenol and Norco within a 6 hour period as Norco contains Tylenol. If you do not need the narcotic pain medication, you do not need to fill the prescription.  Call office [phone]) at any time if any questions, worsening pain, fevers/chills, bleeding, drainage from incision site, or other concerns.  AMBULATORY SURGERY  DISCHARGE INSTRUCTIONS   1) The drugs that you were given will stay in your system until tomorrow so for the next 24 hours you should not:  A) Drive an automobile B) Make any legal decisions C) Drink any alcoholic beverage   2) You may resume regular meals tomorrow.  Today it is better to start with liquids and gradually work up to solid foods.  You may eat anything you prefer, but it is better to start with liquids, then soup and crackers, and gradually work up to solid foods.   3) Please notify your doctor immediately if you have any unusual bleeding, trouble breathing, redness and pain at the surgery site, drainage, fever, or pain not relieved by medication.    4) Additional Instructions:        Please contact your physician with any problems or Same Day Surgery at [phone], Monday through  Friday 6 am to 4 pm, or [place] at [facility] number at [phone].

## 2020-02-24 NOTE — Progress Notes (Signed)
Pharmacist Chemotherapy Monitoring - Initial Assessment    Anticipated start date: 03/02/20   Regimen:  . Are orders appropriate based on the patient's diagnosis, regimen, and cycle? Yes . Does the plan date match the patient's scheduled date? Yes . Is the sequencing of drugs appropriate? Yes . Are the premedications appropriate for the patient's regimen? Yes . Prior Authorization for treatment is: Approved o If applicable, is the correct biosimilar selected based on the patient's insurance? not applicable  Organ Function and Labs: Marland Kitchen Are dose adjustments needed based on the patient's renal function, hepatic function, or hematologic function? No . Are appropriate labs ordered prior to the start of patient's treatment? Yes . Other organ system assessment, if indicated: N/A . The following baseline labs, if indicated, have been ordered: rituximab: baseline Hepatitis B labs  Dose Assessment: . Are the drug doses appropriate? Yes . Are the following correct: o Drug concentrations Yes o IV fluid compatible with drug Yes o Administration routes Yes o Timing of therapy Yes . If applicable, does the patient have documented access for treatment and/or plans for port-a-cath placement? not applicable . If applicable, have lifetime cumulative doses been properly documented and assessed? not applicable Lifetime Dose Tracking  No doses have been documented on this patient for the following tracked chemicals: Doxorubicin, Epirubicin, Idarubicin, Daunorubicin, Mitoxantrone, Bleomycin, Oxaliplatin, Carboplatin, Liposomal Doxorubicin  o   Toxicity Monitoring/Prevention: . The patient has the following take home antiemetics prescribed: Ondansetron, Prochlorperazine, Dexamethasone and Lorazepam . The patient has the following take home medications prescribed: VZV prophylaxis . Medication allergies and previous infusion related reactions, if applicable, have been reviewed and addressed. Yes . The  patient's current medication list has been assessed for drug-drug interactions with their chemotherapy regimen. no significant drug-drug interactions were identified on review.  Order Review: . Are the treatment plan orders signed? Yes . Is the patient scheduled to see a provider prior to their treatment? Yes  I verify that I have reviewed each item in the above checklist and answered each question accordingly.  Mikyla Schachter K 02/24/2020 8:47 AM

## 2020-02-24 NOTE — Interval H&P Note (Signed)
History and Physical Interval Note:  02/24/2020 6:56 AM  [name]  has presented today for surgery, with the diagnosis of R59.1 Head and neck lymphadenopathy and follicular lymphoma.  The various methods of treatment have been discussed with the patient and family. After consideration of risks, benefits and other options for treatment, the patient has consented to  Procedure(s): INSERTION PORT-A-CATH (N/A) as a surgical intervention.  The patient's history has been reviewed, patient examined, no change in status, stable for surgery.  I have reviewed the patient's chart and labs.  Questions were answered to the patient's satisfaction.     Danielle Lewis

## 2020-02-24 NOTE — Anesthesia Postprocedure Evaluation (Signed)
Anesthesia Post Note  Patient: Danielle Lewis  Procedure(s) Performed: INSERTION PORT-A-CATH (N/A Chest)  Patient location during evaluation: PACU Anesthesia Type: General Level of consciousness: awake and alert Pain management: pain level controlled Vital Signs Assessment: post-procedure vital signs reviewed and stable Respiratory status: spontaneous breathing, nonlabored ventilation and respiratory function stable Cardiovascular status: blood pressure returned to baseline and stable Postop Assessment: no apparent nausea or vomiting Anesthetic complications: no   No complications documented.   Last Vitals:  Vitals:   02/24/20 0924 02/24/20 0930  BP: 122/88 139/80  Pulse: 84 78  Resp: 19 18  Temp:  (!) 36 C  SpO2: 94% 97%    Last Pain:  Vitals:   02/24/20 0945  TempSrc:   PainSc: [place]

## 2020-03-02 ENCOUNTER — Inpatient Hospital Stay: Payer: 59 | Attending: Internal Medicine

## 2020-03-02 ENCOUNTER — Encounter: Payer: Self-pay | Admitting: Internal Medicine

## 2020-03-02 ENCOUNTER — Inpatient Hospital Stay (HOSPITAL_BASED_OUTPATIENT_CLINIC_OR_DEPARTMENT_OTHER): Payer: 59 | Admitting: Internal Medicine

## 2020-03-02 ENCOUNTER — Other Ambulatory Visit: Payer: Self-pay

## 2020-03-02 ENCOUNTER — Inpatient Hospital Stay: Payer: 59

## 2020-03-02 VITALS — BP 129/71 | HR 77 | Temp 98.1°F | Resp 16

## 2020-03-02 DIAGNOSIS — R61 Generalized hyperhidrosis: Secondary | ICD-10-CM | POA: Diagnosis not present

## 2020-03-02 DIAGNOSIS — I10 Essential (primary) hypertension: Secondary | ICD-10-CM | POA: Insufficient documentation

## 2020-03-02 DIAGNOSIS — C8218 Follicular lymphoma grade II, lymph nodes of multiple sites: Secondary | ICD-10-CM

## 2020-03-02 DIAGNOSIS — K219 Gastro-esophageal reflux disease without esophagitis: Secondary | ICD-10-CM | POA: Insufficient documentation

## 2020-03-02 DIAGNOSIS — F419 Anxiety disorder, unspecified: Secondary | ICD-10-CM | POA: Insufficient documentation

## 2020-03-02 DIAGNOSIS — Z79899 Other long term (current) drug therapy: Secondary | ICD-10-CM | POA: Diagnosis not present

## 2020-03-02 DIAGNOSIS — M542 Cervicalgia: Secondary | ICD-10-CM | POA: Diagnosis not present

## 2020-03-02 DIAGNOSIS — Z5111 Encounter for antineoplastic chemotherapy: Secondary | ICD-10-CM | POA: Insufficient documentation

## 2020-03-02 DIAGNOSIS — R590 Localized enlarged lymph nodes: Secondary | ICD-10-CM | POA: Diagnosis not present

## 2020-03-02 LAB — CBC WITH DIFFERENTIAL/PLATELET
Abs Immature Granulocytes: 0.01 10*3/uL (ref 0.00–0.07)
Basophils Absolute: 0.1 10*3/uL (ref 0.0–0.1)
Basophils Relative: 1 %
Eosinophils Absolute: 0.3 10*3/uL (ref 0.0–0.5)
Eosinophils Relative: 4 %
HCT: 42.7 % (ref 36.0–46.0)
Hemoglobin: 14.9 g/dL (ref 12.0–15.0)
Immature Granulocytes: 0 %
Lymphocytes Relative: 28 %
Lymphs Abs: 2.1 10*3/uL (ref 0.7–4.0)
MCH: 30.1 pg (ref 26.0–34.0)
MCHC: 34.9 g/dL (ref 30.0–36.0)
MCV: 86.3 fL (ref 80.0–100.0)
Monocytes Absolute: 0.7 10*3/uL (ref 0.1–1.0)
Monocytes Relative: 10 %
Neutro Abs: 4.3 10*3/uL (ref 1.7–7.7)
Neutrophils Relative %: 57 %
Platelets: 205 10*3/uL (ref 150–400)
RBC: 4.95 MIL/uL (ref 3.87–5.11)
RDW: 13.2 % (ref 11.5–15.5)
WBC: 7.5 10*3/uL (ref 4.0–10.5)
nRBC: 0 % (ref 0.0–0.2)

## 2020-03-02 LAB — COMPREHENSIVE METABOLIC PANEL
ALT: 23 U/L (ref 0–44)
AST: 24 U/L (ref 15–41)
Albumin: 4.2 g/dL (ref 3.5–5.0)
Alkaline Phosphatase: 89 U/L (ref 38–126)
Anion gap: 10 (ref 5–15)
BUN: 23 mg/dL (ref 8–23)
CO2: 21 mmol/L — ABNORMAL LOW (ref 22–32)
Calcium: 9.1 mg/dL (ref 8.9–10.3)
Chloride: 109 mmol/L (ref 98–111)
Creatinine, Ser: 0.93 mg/dL (ref 0.44–1.00)
GFR calc Af Amer: >60 mL/min (ref >60)
GFR calc non Af Amer: >60 mL/min (ref >60)
Glucose, Bld: 101 mg/dL — ABNORMAL HIGH (ref 70–99)
Potassium: 3.9 mmol/L (ref 3.5–5.1)
Sodium: 140 mmol/L (ref 135–145)
Total Bilirubin: 1 mg/dL (ref 0.3–1.2)
Total Protein: 7.3 g/dL (ref 6.5–8.1)

## 2020-03-02 LAB — LACTATE DEHYDROGENASE: LDH: 174 U/L (ref 98–192)

## 2020-03-02 MED ORDER — ACETAMINOPHEN 325 MG PO TABS
650.0000 mg | ORAL_TABLET | Freq: Once | ORAL | Status: AC
  Administered 2020-03-02: 650 mg via ORAL
  Filled 2020-03-02: qty 2

## 2020-03-02 MED ORDER — HEPARIN SOD (PORK) LOCK FLUSH 100 UNIT/ML IV SOLN
500.0000 [IU] | Freq: Once | INTRAVENOUS | Status: DC | PRN
  Filled 2020-03-02: qty 5

## 2020-03-02 MED ORDER — HEPARIN SOD (PORK) LOCK FLUSH 100 UNIT/ML IV SOLN
500.0000 [IU] | Freq: Once | INTRAVENOUS | Status: AC
  Administered 2020-03-02: 500 [IU] via INTRAVENOUS
  Filled 2020-03-02: qty 5

## 2020-03-02 MED ORDER — SODIUM CHLORIDE 0.9% FLUSH
10.0000 mL | INTRAVENOUS | Status: DC | PRN
  Administered 2020-03-02: 10 mL via INTRAVENOUS
  Filled 2020-03-02: qty 10

## 2020-03-02 MED ORDER — AMLODIPINE BESYLATE 5 MG PO TABS
5.0000 mg | ORAL_TABLET | Freq: Every day | ORAL | 3 refills | Status: DC
Start: 2020-03-02 — End: 2020-05-31

## 2020-03-02 MED ORDER — SODIUM CHLORIDE 0.9 % IV SOLN
90.0000 mg/m2 | Freq: Once | INTRAVENOUS | Status: AC
  Administered 2020-03-02: 175 mg via INTRAVENOUS
  Filled 2020-03-02: qty 7

## 2020-03-02 MED ORDER — HEPARIN SOD (PORK) LOCK FLUSH 100 UNIT/ML IV SOLN
INTRAVENOUS | Status: AC
  Filled 2020-03-02: qty 5

## 2020-03-02 MED ORDER — SODIUM CHLORIDE 0.9 % IV SOLN
375.0000 mg/m2 | Freq: Once | INTRAVENOUS | Status: AC
  Administered 2020-03-02: 700 mg via INTRAVENOUS
  Filled 2020-03-02: qty 50

## 2020-03-02 MED ORDER — SODIUM CHLORIDE 0.9 % IV SOLN
10.0000 mg | Freq: Once | INTRAVENOUS | Status: AC
  Administered 2020-03-02: 10 mg via INTRAVENOUS
  Filled 2020-03-02: qty 10

## 2020-03-02 MED ORDER — PALONOSETRON HCL INJECTION 0.25 MG/5ML
0.2500 mg | Freq: Once | INTRAVENOUS | Status: AC
  Administered 2020-03-02: 0.25 mg via INTRAVENOUS
  Filled 2020-03-02: qty 5

## 2020-03-02 MED ORDER — SODIUM CHLORIDE 0.9 % IV SOLN
Freq: Once | INTRAVENOUS | Status: AC
  Filled 2020-03-02: qty 250

## 2020-03-02 MED ORDER — DIPHENHYDRAMINE HCL 25 MG PO CAPS
50.0000 mg | ORAL_CAPSULE | Freq: Once | ORAL | Status: AC
  Administered 2020-03-02: 50 mg via ORAL
  Filled 2020-03-02: qty 2

## 2020-03-02 NOTE — Progress Notes (Signed)
Danielle Lewis tolerated her first Rituxan treatment without any complications. Vitals signs remained stable throughout her treatment.

## 2020-03-02 NOTE — Assessment & Plan Note (Addendum)
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  Maximum SUV 11.  Plan bendamustine Rituxan on a monthly basis x6.   #Proceed with cycle #1 bendamustine Rituxan today labs today reviewed;  acceptable for treatment today.  Again reviewed the potential side effects including but not limited to nausea vomiting diarrhea risk of infections etc.  #Poorly controlled HTN-diastolic 342; however repeat BP- 140/s80.  Recommend starting Norvasc.  Prescription sent  # DISPOSITION:  # chemo today; D-2 as planned #  cancel prior Aug appt.  # Aug 8th- MD;labs- cbc/cmp; LDH; Rituxan-Benda; D-2-Benda- Dr.B  Cc; Dr.Cintron/Fitzgerald.

## 2020-03-02 NOTE — Progress Notes (Signed)
[facility] CONSULT NOTE  Patient Care Team: Peggye Form, NP as PCP - General (Family Medicine) Herbert Pun, MD as Consulting Physician (General Surgery)  CHIEF COMPLAINTS/PURPOSE OF CONSULTATION:   Oncology History Overview Note  # MAY 2021- Bilateral neck adenopathy right more than left; highly concerning for lymphoproliferative disorder.  Excisional biopsy right neck- [Dr.Cintron];  A. 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   B. 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   # MAY 2021-CBC/CMP normal /  mildly elevated alkaline phosphatase. [PCP; [facility]].  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     HISTORY OF PRESENTING ILLNESS:  Danielle Lewis 64 y.o.  female  follicular lymphoma grade 1-2 is here for follow-up/is here to proceed with chemotherapy.  Patient continues to have night sweats every 2 to 3 days.  Drenching night sweats.  No new lumps or bumps.  Continues to have pain at the site of her  right neck adenopathy/prior surgical incision.  No weight loss.  No nausea no vomiting.  States to have a history of-blood pressure-diastolic above 902.  Patient not on any antihypertensives.  Review of Systems  Constitutional: Positive for diaphoresis and malaise/fatigue. Negative for chills, fever and weight loss.  HENT: Negative for nosebleeds and sore throat.   Eyes: Negative for double vision.  Respiratory: Negative for cough, hemoptysis, sputum production, shortness of breath and wheezing.   Cardiovascular: Negative for chest pain, palpitations, orthopnea and leg swelling.  Gastrointestinal: Negative for abdominal pain, blood in stool, constipation, diarrhea, heartburn, melena, nausea and vomiting.  Genitourinary: Negative for dysuria, frequency and urgency.  Musculoskeletal: Negative for back pain and joint pain.  Skin: Negative.  Negative for itching and rash.  Neurological: Negative for dizziness, tingling, focal weakness, weakness and headaches.  Endo/Heme/Allergies: Does not bruise/bleed easily.  Psychiatric/Behavioral: Negative for depression. The patient is not nervous/anxious and does not have insomnia.      MEDICAL HISTORY:  Past Medical History:  Diagnosis Date  . Anxiety   . GERD (gastroesophageal reflux disease)   . Hypertension     SURGICAL HISTORY: Past Surgical History:  Procedure Laterality Date  . ABDOMINAL HYSTERECTOMY    . APPENDECTOMY    . CHOLECYSTECTOMY     28years ago  . LYMPH GLAND EXCISION Right 01/22/2020   Procedure: CERVICAL LYMPH GLAND EXCISION;  Surgeon: Herbert Pun, MD;  Location: [facility] ORS;  Service: General;  Laterality: Right;  . PORTACATH  PLACEMENT N/A 02/24/2020   Procedure: INSERTION PORT-A-CATH;  Surgeon: Herbert Pun, MD;  Location: [facility] ORS;  Service: General;  Laterality: N/A;  . TONSILLECTOMY      SOCIAL HISTORY: Social History   Socioeconomic History  . Marital status: Married    Spouse name: Not on file   . Number of children: Not on file  . Years of education: Not on file  . Highest education level: Not on file  Occupational History  . Not on file  Tobacco Use  . Smoking status: Never Smoker  . Smokeless tobacco: Never Used  Vaping Use  . Vaping Use: Never used  Substance and Sexual Activity  . Alcohol use: Yes    Comment: occassional  . Drug use: Not Currently  . Sexual activity: Not on file  Other Topics Concern  . Not on file  Social History Narrative   Lives in [place]; [place]. Never smoked; wine rarely. Used in work in [employer]. With husband.    Social Determinants of Health   Financial Resource Strain:   . Difficulty of Paying Living Expenses:   Food Insecurity:   . Worried About Charity fundraiser in the Last Year:   . Arboriculturist in the Last Year:   Transportation Needs:   . Film/video editor (Medical):   Marland Kitchen Lack of Transportation (Non-Medical):   Physical Activity:   . Days of Exercise per Week:   . Minutes of Exercise per Session:   Stress:   . Feeling of Stress :   Social Connections:   . Frequency of Communication with Friends and Family:   . Frequency of Social Gatherings with Friends and Family:   . Attends Religious Services:   . Active Member of Clubs or Organizations:   . Attends Archivist Meetings:   Marland Kitchen Marital Status:   Intimate Partner Violence:   . Fear of Current or Ex-Partner:   . Emotionally Abused:   Marland Kitchen Physically Abused:   . Sexually Abused:     FAMILY HISTORY: Family History  Problem Relation Age of Onset  . Cancer Mother        unknown type    ALLERGIES:  is allergic to penicillins.  MEDICATIONS:  Current Outpatient Medications  Medication Sig Dispense Refill  . acyclovir (ZOVIRAX) 400 MG tablet Take 1 tablet (400 mg total) by mouth 2 (two) times daily. One pill a day [to prevent shingles] 60 tablet 6  . ALPRAZolam (XANAX) 0.25 MG tablet Take 0.25-5 mg by mouth at bedtime.     . lidocaine-prilocaine  (EMLA) cream Apply 1 application topically once as needed for up to 1 dose. Apply the cream generously to the port site 45-60 mins prior to access. 30 g 0  . amLODipine (NORVASC) 5 MG tablet Take 1 tablet (5 mg total) by mouth daily. 30 tablet 3  . ondansetron (ZOFRAN) 4 MG tablet Take 1 tablet (4 mg total) by mouth every 8 (eight) hours as needed for nausea or vomiting. (Patient not taking: Reported on 03/02/2020) 20 tablet 0  . prochlorperazine (COMPAZINE) 10 MG tablet Take 1 tablet (10 mg total) by mouth every 6 (six) hours as needed for nausea or vomiting. (Patient not taking: Reported on 03/02/2020) 40 tablet 1   No current facility-administered medications for this visit.   Facility-Administered Medications Ordered in Other Visits  Medication Dose Route Frequency Provider Last Rate Last Admin  . bendamustine (BENDEKA) 175 mg in sodium chloride 0.9 % 50 mL (3.0702  mg/mL) chemo infusion  90 mg/m2 (Treatment Plan Recorded) Intravenous Once Charlaine Dalton R, MD      . heparin lock flush 100 unit/mL  500 Units Intravenous Once Charlaine Dalton R, MD      . heparin lock flush 100 unit/mL  500 Units Intracatheter Once PRN Charlaine Dalton R, MD      . sodium chloride flush (NS) 0.9 % injection 10 mL  10 mL Intravenous PRN Cammie Sickle, MD   10 mL at 03/02/20 0814      .  PHYSICAL EXAMINATION: ECOG PERFORMANCE STATUS: 0 - Asymptomatic  Vitals:   03/02/20 0827 03/02/20 0857  BP: (!) 141/109 140/80  Pulse: 79   Resp: 16   Temp: 97.6 F (36.4 C)   SpO2: 99%    Filed Weights   03/02/20 0827  Weight: 188 lb 3.2 oz (85.4 kg)    Physical Exam Constitutional:      Comments: Walk independently.  Alone.   HENT:     Head: Normocephalic and atraumatic.     Mouth/Throat:     Pharynx: No oropharyngeal exudate.  Eyes:     Pupils: Pupils are equal, round, and reactive to light.  Neck:     Comments: 4-5 cm bulky lymph nodes noted on the right cervical chain; mild adenopathy  noted on the left side. Cardiovascular:     Rate and Rhythm: Normal rate and regular rhythm.  Pulmonary:     Effort: Pulmonary effort is normal. No respiratory distress.     Breath sounds: Normal breath sounds. No wheezing.  Abdominal:     General: Bowel sounds are normal. There is no distension.     Palpations: Abdomen is soft. There is no mass.     Tenderness: There is no abdominal tenderness. There is no guarding or rebound.  Musculoskeletal:        General: No tenderness. Normal range of motion.     Cervical back: Normal range of motion and neck supple.  Skin:    General: Skin is warm.  Neurological:     Mental Status: She is alert and oriented to person, place, and time.  Psychiatric:        Mood and Affect: Affect normal.      LABORATORY DATA:  I have reviewed the data as listed Lab Results  Component Value Date   WBC 7.5 03/02/2020   HGB 14.9 03/02/2020   HCT 42.7 03/02/2020   MCV 86.3 03/02/2020   PLT 205 03/02/2020   Recent Labs    01/06/20 1506 03/02/20 0814  NA  --  140  K  --  3.9  CL  --  109  CO2  --  21*  GLUCOSE  --  101*  BUN  --  23  CREATININE 1.20* 0.93  CALCIUM  --  9.1  GFRNONAA  --  >60  GFRAA  --  >60  PROT  --  7.3  ALBUMIN  --  4.2  AST  --  24  ALT  --  23  ALKPHOS  --  89  BILITOT  --  1.0    RADIOGRAPHIC STUDIES: I have personally reviewed the radiological images as listed and agreed with the findings in the report. DG Chest Port 1 View  Result Date: 02/24/2020 CLINICAL DATA:  Status post Port-A-Cath placement EXAM: PORTABLE CHEST 1 VIEW COMPARISON:  None. FINDINGS: There is mild bilateral interstitial thickening likely chronic. There is no focal consolidation. There is no pleural effusion or pneumothorax.  The heart and mediastinal contours are unremarkable. There is a left-sided Port-A-Cath in satisfactory position with the tip projecting over the SVC. There is no acute osseous abnormality. IMPRESSION: Left-sided Port-A-Cath in  satisfactory position. Electronically Signed   By: Kathreen Devoid   On: 02/24/2020 09:54   DG C-Arm 1-60 Min-No Report  Result Date: 02/24/2020 Fluoroscopy was utilized by the requesting physician.  No radiographic interpretation.    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  Maximum SUV 11.  Plan bendamustine Rituxan on a monthly basis x6.   #Proceed with cycle #1 bendamustine Rituxan today labs today reviewed;  acceptable for treatment today.  Again reviewed the potential side effects including but not limited to nausea vomiting diarrhea risk of infections etc.  #Poorly controlled HTN-diastolic 472; however repeat BP- 140/s80.  Recommend starting Norvasc.  Prescription sent  # DISPOSITION:  # chemo today; D-2 as planned #  cancel prior Aug appt.  # Aug 8th- MD;labs- cbc/cmp; LDH; Rituxan-Benda; D-2-Benda- Dr.B  Cc; Dr.Cintron/[place].    All questions were answered. The patient knows to call the clinic with any problems, questions or concerns.    Cammie Sickle, MD 03/02/2020 11:22 AM

## 2020-03-03 ENCOUNTER — Telehealth: Payer: Self-pay

## 2020-03-03 ENCOUNTER — Inpatient Hospital Stay: Payer: 59

## 2020-03-03 VITALS — BP 136/58 | HR 61 | Temp 97.9°F | Resp 20

## 2020-03-03 DIAGNOSIS — Z5111 Encounter for antineoplastic chemotherapy: Secondary | ICD-10-CM | POA: Diagnosis not present

## 2020-03-03 DIAGNOSIS — C8218 Follicular lymphoma grade II, lymph nodes of multiple sites: Secondary | ICD-10-CM

## 2020-03-03 MED ORDER — SODIUM CHLORIDE 0.9 % IV SOLN
10.0000 mg | Freq: Once | INTRAVENOUS | Status: AC
  Administered 2020-03-03: 10 mg via INTRAVENOUS
  Filled 2020-03-03: qty 10

## 2020-03-03 MED ORDER — SODIUM CHLORIDE 0.9% FLUSH
10.0000 mL | INTRAVENOUS | Status: DC | PRN
  Administered 2020-03-03: 10 mL
  Filled 2020-03-03: qty 10

## 2020-03-03 MED ORDER — HEPARIN SOD (PORK) LOCK FLUSH 100 UNIT/ML IV SOLN
INTRAVENOUS | Status: AC
  Filled 2020-03-03: qty 5

## 2020-03-03 MED ORDER — SODIUM CHLORIDE 0.9 % IV SOLN
Freq: Once | INTRAVENOUS | Status: AC
  Filled 2020-03-03: qty 250

## 2020-03-03 MED ORDER — HEPARIN SOD (PORK) LOCK FLUSH 100 UNIT/ML IV SOLN
500.0000 [IU] | Freq: Once | INTRAVENOUS | Status: AC | PRN
  Administered 2020-03-03: 500 [IU]
  Filled 2020-03-03: qty 5

## 2020-03-03 MED ORDER — SODIUM CHLORIDE 0.9 % IV SOLN
90.0000 mg/m2 | Freq: Once | INTRAVENOUS | Status: AC
  Administered 2020-03-03: 175 mg via INTRAVENOUS
  Filled 2020-03-03: qty 7

## 2020-03-03 NOTE — Telephone Encounter (Signed)
Telephone call to patient for follow up after receiving first infusion.   Patient states infusion went great.  States eating good and drinking plenty of fluids.   Denies any nausea or vomiting.  Encouraged patient to call for any concerns or questions. 

## 2020-04-01 ENCOUNTER — Ambulatory Visit: Payer: 59 | Admitting: Internal Medicine

## 2020-04-01 ENCOUNTER — Other Ambulatory Visit: Payer: 59

## 2020-04-04 ENCOUNTER — Other Ambulatory Visit: Payer: Self-pay

## 2020-04-04 ENCOUNTER — Inpatient Hospital Stay: Payer: 59

## 2020-04-04 ENCOUNTER — Encounter: Payer: Self-pay | Admitting: Internal Medicine

## 2020-04-04 ENCOUNTER — Inpatient Hospital Stay: Payer: 59 | Attending: Oncology

## 2020-04-04 ENCOUNTER — Inpatient Hospital Stay (HOSPITAL_BASED_OUTPATIENT_CLINIC_OR_DEPARTMENT_OTHER): Payer: 59 | Admitting: Internal Medicine

## 2020-04-04 DIAGNOSIS — C8218 Follicular lymphoma grade II, lymph nodes of multiple sites: Secondary | ICD-10-CM

## 2020-04-04 DIAGNOSIS — K219 Gastro-esophageal reflux disease without esophagitis: Secondary | ICD-10-CM | POA: Diagnosis not present

## 2020-04-04 DIAGNOSIS — Z79899 Other long term (current) drug therapy: Secondary | ICD-10-CM | POA: Diagnosis not present

## 2020-04-04 DIAGNOSIS — G4700 Insomnia, unspecified: Secondary | ICD-10-CM | POA: Insufficient documentation

## 2020-04-04 DIAGNOSIS — I10 Essential (primary) hypertension: Secondary | ICD-10-CM | POA: Diagnosis not present

## 2020-04-04 DIAGNOSIS — Z5112 Encounter for antineoplastic immunotherapy: Secondary | ICD-10-CM | POA: Insufficient documentation

## 2020-04-04 DIAGNOSIS — R112 Nausea with vomiting, unspecified: Secondary | ICD-10-CM | POA: Insufficient documentation

## 2020-04-04 LAB — CBC WITH DIFFERENTIAL/PLATELET
Abs Immature Granulocytes: 0.01 10*3/uL (ref 0.00–0.07)
Basophils Absolute: 0.1 10*3/uL (ref 0.0–0.1)
Basophils Relative: 1 %
Eosinophils Absolute: 0.2 10*3/uL (ref 0.0–0.5)
Eosinophils Relative: 3 %
HCT: 39.9 % (ref 36.0–46.0)
Hemoglobin: 13.9 g/dL (ref 12.0–15.0)
Immature Granulocytes: 0 %
Lymphocytes Relative: 18 %
Lymphs Abs: 0.9 10*3/uL (ref 0.7–4.0)
MCH: 30.3 pg (ref 26.0–34.0)
MCHC: 34.8 g/dL (ref 30.0–36.0)
MCV: 86.9 fL (ref 80.0–100.0)
Monocytes Absolute: 0.8 10*3/uL (ref 0.1–1.0)
Monocytes Relative: 16 %
Neutro Abs: 3.1 10*3/uL (ref 1.7–7.7)
Neutrophils Relative %: 62 %
Platelets: 155 10*3/uL (ref 150–400)
RBC: 4.59 MIL/uL (ref 3.87–5.11)
RDW: 13.6 % (ref 11.5–15.5)
WBC: 5.1 10*3/uL (ref 4.0–10.5)
nRBC: 0 % (ref 0.0–0.2)

## 2020-04-04 LAB — COMPREHENSIVE METABOLIC PANEL
ALT: 33 U/L (ref 0–44)
AST: 30 U/L (ref 15–41)
Albumin: 4.2 g/dL (ref 3.5–5.0)
Alkaline Phosphatase: 86 U/L (ref 38–126)
Anion gap: 10 (ref 5–15)
BUN: 17 mg/dL (ref 8–23)
CO2: 21 mmol/L — ABNORMAL LOW (ref 22–32)
Calcium: 9.2 mg/dL (ref 8.9–10.3)
Chloride: 108 mmol/L (ref 98–111)
Creatinine, Ser: 0.97 mg/dL (ref 0.44–1.00)
GFR calc Af Amer: >60 mL/min (ref >60)
GFR calc non Af Amer: >60 mL/min (ref >60)
Glucose, Bld: 133 mg/dL — ABNORMAL HIGH (ref 70–99)
Potassium: 3.7 mmol/L (ref 3.5–5.1)
Sodium: 139 mmol/L (ref 135–145)
Total Bilirubin: 0.8 mg/dL (ref 0.3–1.2)
Total Protein: 6.8 g/dL (ref 6.5–8.1)

## 2020-04-04 LAB — LACTATE DEHYDROGENASE: LDH: 144 U/L (ref 98–192)

## 2020-04-04 MED ORDER — HEPARIN SOD (PORK) LOCK FLUSH 100 UNIT/ML IV SOLN
INTRAVENOUS | Status: AC
  Filled 2020-04-04: qty 5

## 2020-04-04 MED ORDER — ACETAMINOPHEN 325 MG PO TABS
650.0000 mg | ORAL_TABLET | Freq: Once | ORAL | Status: AC
  Administered 2020-04-04: 650 mg via ORAL
  Filled 2020-04-04: qty 2

## 2020-04-04 MED ORDER — SODIUM CHLORIDE 0.9% FLUSH
10.0000 mL | Freq: Once | INTRAVENOUS | Status: AC
  Administered 2020-04-04: 10 mL via INTRAVENOUS
  Filled 2020-04-04: qty 10

## 2020-04-04 MED ORDER — SODIUM CHLORIDE 0.9 % IV SOLN
Freq: Once | INTRAVENOUS | Status: AC
  Filled 2020-04-04: qty 250

## 2020-04-04 MED ORDER — DIPHENHYDRAMINE HCL 25 MG PO CAPS
50.0000 mg | ORAL_CAPSULE | Freq: Once | ORAL | Status: AC
  Administered 2020-04-04: 50 mg via ORAL
  Filled 2020-04-04: qty 2

## 2020-04-04 MED ORDER — PALONOSETRON HCL INJECTION 0.25 MG/5ML
0.2500 mg | Freq: Once | INTRAVENOUS | Status: AC
  Administered 2020-04-04: 0.25 mg via INTRAVENOUS
  Filled 2020-04-04: qty 5

## 2020-04-04 MED ORDER — HEPARIN SOD (PORK) LOCK FLUSH 100 UNIT/ML IV SOLN
500.0000 [IU] | Freq: Once | INTRAVENOUS | Status: AC
  Administered 2020-04-04: 500 [IU]
  Filled 2020-04-04: qty 5

## 2020-04-04 MED ORDER — SODIUM CHLORIDE 0.9 % IV SOLN: 375.0000 mg/m2 | Freq: Once | INTRAVENOUS | Status: DC

## 2020-04-04 MED ORDER — ZOLPIDEM TARTRATE ER 6.25 MG PO TBCR
6.2500 mg | EXTENDED_RELEASE_TABLET | Freq: Every evening | ORAL | 0 refills | Status: DC | PRN
Start: 2020-04-04 — End: 2020-05-03

## 2020-04-04 MED ORDER — SODIUM CHLORIDE 0.9 % IV SOLN
375.0000 mg/m2 | Freq: Once | INTRAVENOUS | Status: AC
  Administered 2020-04-04: 700 mg via INTRAVENOUS
  Filled 2020-04-04: qty 50

## 2020-04-04 MED ORDER — LIDOCAINE-PRILOCAINE 2.5-2.5 % EX CREA: 1.0000 "application " | TOPICAL_CREAM | Freq: Once | CUTANEOUS | 0 refills | Status: DC | PRN

## 2020-04-04 MED ORDER — SODIUM CHLORIDE 0.9 % IV SOLN
90.0000 mg/m2 | Freq: Once | INTRAVENOUS | Status: AC
  Administered 2020-04-04: 175 mg via INTRAVENOUS
  Filled 2020-04-04: qty 7

## 2020-04-04 MED ORDER — SODIUM CHLORIDE 0.9 % IV SOLN
10.0000 mg | Freq: Once | INTRAVENOUS | Status: AC
  Administered 2020-04-04: 10 mg via INTRAVENOUS
  Filled 2020-04-04: qty 10

## 2020-04-04 NOTE — Assessment & Plan Note (Addendum)
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  Maximum SUV 11.  Currently on bendamustine Rituxan.  # Proceed with cycle #2 bendamustine Rituxan today.  Awaiting her labs today-if labs adequate we will proceed with chemotherapy today.   # Nausea/vomitting- G-12- on anti-emetics prn.   #HTN- on Norvasc 5 mg/day. STABLE.   # Insomnia- trial of ambien 5 mg CR.  Discussed regarding risk of falls and nightmares  # DISPOSITION:  # chemo today; D-2 as planned # follow up in 4 weeks- MD;labs- cbc/cmp; LDH; Rituxan-Benda; D-2-Benda- Dr.B

## 2020-04-04 NOTE — Progress Notes (Signed)
Attempted to access port with 20G 1 inch needle. Minimal blood return (10 ml) from port site. Swelling noted above port site when flushing with NS. Pt repositioned multiple times. Needle withdrawn. Pt reaccessed with 20G 1.5 inch needle. Port flushed easily. No swelling noted. Patient repositioned several times for Sufficient Blood return. Blood return was noted after multiple flushes with 10 ML Normal Saline Syringes. Sufficient Blood collected for lab specimens. Antimicrobial.

## 2020-04-04 NOTE — Progress Notes (Signed)
[facility] CONSULT NOTE  Patient Care Team: Peggye Form, NP as PCP - General (Family Medicine) Herbert Pun, MD as Consulting Physician (General Surgery)  CHIEF COMPLAINTS/PURPOSE OF CONSULTATION:   Oncology History Overview Note  # MAY 2021- Bilateral neck adenopathy right more than left; highly concerning for lymphoproliferative disorder.  Excisional biopsy right neck- [Dr.Cintron];  A. 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   B. 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   # MAY 2021-CBC/CMP normal /  mildly elevated alkaline phosphatase. [PCP; [facility]].  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[place])   03/02/2020 -  Chemotherapy   The patient had dexamethasone (DECADRON) 4 MG tablet, 8 mg, Oral, Daily, 1 of 1 cycle, Start date: --, End date: -- palonosetron (ALOXI) injection 0.25 mg, 0.25 mg, Intravenous,  Once, 1 of 5 cycles Administration: 0.25 mg (03/02/2020) bendamustine (BENDEKA) 175 mg in sodium chloride 0.9 % 50 mL (3.0702 mg/mL) chemo infusion, 90 mg/m2 = 175 mg, Intravenous,  Once, 1 of 5 cycles Administration: 175 mg (03/02/2020), 175 mg (03/03/2020) riTUXimab-pvvr (RUXIENCE) 700 mg in sodium chloride 0.9 % 250 mL (2.1875 mg/mL) infusion, 375 mg/m2 = 700 mg, Intravenous,  Once, 1 of 5 cycles Administration: 700 mg (03/02/2020)  for chemotherapy treatment.     HISTORY OF PRESENTING ILLNESS:  Danielle Lewis 64 y.o.  female  follicular lymphoma grade 1-2 currently on bendamustine Rituxan is here for follow-up.  Patient noted to have nausea  with vomiting 3 to 4 days post chemotherapy that lasted for 2 days.  Patient took antiemetics resolved.  Continues to have night sweats every 2 to 3 days.  Patient also noted to have difficulty sleeping at night.  Has tried melatonin; Benadryl not help.  Cannot really explain why. Review of Systems  Constitutional: Positive for diaphoresis and malaise/fatigue. Negative for chills, fever and weight loss.  HENT: Negative for nosebleeds and sore throat.   Eyes: Negative for double vision.  Respiratory: Negative for cough, hemoptysis, sputum production, shortness of breath and wheezing.   Cardiovascular: Negative for chest pain, palpitations, orthopnea and leg swelling.  Gastrointestinal: Negative for abdominal pain, blood in stool, constipation, diarrhea, heartburn, melena, nausea and vomiting.  Genitourinary: Negative for dysuria, frequency and urgency.  Musculoskeletal: Negative for back pain and joint pain.  Skin: Negative.  Negative for itching and rash.  Neurological: Negative for dizziness, tingling, focal weakness, weakness and headaches.  Endo/Heme/Allergies: Does not bruise/bleed easily.  Psychiatric/Behavioral: Negative for depression. The patient has insomnia. The patient is not nervous/anxious.      MEDICAL HISTORY:  Past Medical History:  Diagnosis Date  . Anxiety   . GERD (gastroesophageal reflux disease)   . Hypertension     SURGICAL HISTORY: Past Surgical History:  Procedure Laterality Date  . ABDOMINAL HYSTERECTOMY    . APPENDECTOMY    . CHOLECYSTECTOMY     28years ago  . LYMPH GLAND EXCISION Right 01/22/2020   Procedure: CERVICAL LYMPH GLAND EXCISION;  Surgeon: Herbert Pun, MD;  Location: [facility] ORS;  Service:  General;  Laterality: Right;  . PORTACATH PLACEMENT N/A 02/24/2020   Procedure: INSERTION PORT-A-CATH;  Surgeon: Herbert Pun, MD;  Location: [facility] ORS;  Service: General;  Laterality: N/A;  . TONSILLECTOMY      SOCIAL HISTORY: Social  History   Socioeconomic History  . Marital status: Married    Spouse name: Not on file  . Number of children: Not on file  . Years of education: Not on file  . Highest education level: Not on file  Occupational History  . Not on file  Tobacco Use  . Smoking status: Never Smoker  . Smokeless tobacco: Never Used  Vaping Use  . Vaping Use: Never used  Substance and Sexual Activity  . Alcohol use: Yes    Comment: occassional  . Drug use: Not Currently  . Sexual activity: Not on file  Other Topics Concern  . Not on file  Social History Narrative   Lives in [place]; [place]. Never smoked; wine rarely. Used in work in [employer]. With husband.    Social Determinants of Health   Financial Resource Strain:   . Difficulty of Paying Living Expenses:   Food Insecurity:   . Worried About Charity fundraiser in the Last Year:   . Arboriculturist in the Last Year:   Transportation Needs:   . Film/video editor (Medical):   Marland Kitchen Lack of Transportation (Non-Medical):   Physical Activity:   . Days of Exercise per Week:   . Minutes of Exercise per Session:   Stress:   . Feeling of Stress :   Social Connections:   . Frequency of Communication with Friends and Family:   . Frequency of Social Gatherings with Friends and Family:   . Attends Religious Services:   . Active Member of Clubs or Organizations:   . Attends Archivist Meetings:   Marland Kitchen Marital Status:   Intimate Partner Violence:   . Fear of Current or Ex-Partner:   . Emotionally Abused:   Marland Kitchen Physically Abused:   . Sexually Abused:     FAMILY HISTORY: Family History  Problem Relation Age of Onset  . Cancer Mother        unknown type    ALLERGIES:  is allergic to penicillins.  MEDICATIONS:  Current Outpatient Medications  Medication Sig Dispense Refill  . acyclovir (ZOVIRAX) 400 MG tablet Take 1 tablet (400 mg total) by mouth 2 (two) times daily. One pill a day [to prevent shingles] 60 tablet 6  .  amLODipine (NORVASC) 5 MG tablet Take 1 tablet (5 mg total) by mouth daily. 30 tablet 3  . lidocaine-prilocaine (EMLA) cream Apply 1 application topically once as needed for up to 1 dose. Apply the cream generously to the port site 45-60 mins prior to access. 30 g 0  . ondansetron (ZOFRAN) 4 MG tablet Take 1 tablet (4 mg total) by mouth every 8 (eight) hours as needed for nausea or vomiting. 20 tablet 0  . prochlorperazine (COMPAZINE) 10 MG tablet Take 1 tablet (10 mg total) by mouth every 6 (six) hours as needed for nausea or vomiting. 40 tablet 1  . zolpidem (AMBIEN CR) 6.25 MG CR tablet Take 1 tablet (6.25 mg total) by mouth at bedtime as needed for sleep. 30 tablet 0   No current facility-administered medications for this visit.   Facility-Administered Medications Ordered in Other Visits  Medication Dose Route Frequency Provider Last Rate Last Admin  . heparin lock flush 100 unit/mL  500 Units  Intracatheter Once Cammie Sickle, MD          .  PHYSICAL EXAMINATION: ECOG PERFORMANCE STATUS: 0 - Asymptomatic  Vitals:   04/04/20 0837  BP: 108/64  Pulse: 86  Resp: 16  Temp: (!) 96.9 F (36.1 C)  SpO2: 99%   Filed Weights   04/04/20 0837  Weight: 187 lb 12.8 oz (85.2 kg)    Physical Exam Constitutional:      Comments: Walk independently.  Alone.   HENT:     Head: Normocephalic and atraumatic.     Mouth/Throat:     Pharynx: No oropharyngeal exudate.  Eyes:     Pupils: Pupils are equal, round, and reactive to light.  Neck:     Comments: 2-3 cm right sided lymph nodes noted.  Improved. Cardiovascular:     Rate and Rhythm: Normal rate and regular rhythm.  Pulmonary:     Effort: Pulmonary effort is normal. No respiratory distress.     Breath sounds: Normal breath sounds. No wheezing.  Abdominal:     General: Bowel sounds are normal. There is no distension.     Palpations: Abdomen is soft. There is no mass.     Tenderness: There is no abdominal tenderness. There is  no guarding or rebound.  Musculoskeletal:        General: No tenderness. Normal range of motion.     Cervical back: Normal range of motion and neck supple.  Skin:    General: Skin is warm.  Neurological:     Mental Status: She is alert and oriented to person, place, and time.  Psychiatric:        Mood and Affect: Affect normal.      LABORATORY DATA:  I have reviewed the data as listed Lab Results  Component Value Date   WBC 5.1 04/04/2020   HGB 13.9 04/04/2020   HCT 39.9 04/04/2020   MCV 86.9 04/04/2020   PLT 155 04/04/2020   Recent Labs    01/06/20 1506 03/02/20 0814 04/04/20 0910  NA  --  140 139  K  --  3.9 3.7  CL  --  109 108  CO2  --  21* 21*  GLUCOSE  --  101* 133*  BUN  --  23 17  CREATININE 1.20* 0.93 0.97  CALCIUM  --  9.1 9.2  GFRNONAA  --  >60 >60  GFRAA  --  >60 >60  PROT  --  7.3 6.8  ALBUMIN  --  4.2 4.2  AST  --  24 30  ALT  --  23 33  ALKPHOS  --  89 86  BILITOT  --  1.0 0.8    RADIOGRAPHIC STUDIES: I have personally reviewed the radiological images as listed and agreed with the findings in the report. No results found.  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  Maximum SUV 11.  Currently on bendamustine Rituxan.  # Proceed with cycle #2 bendamustine Rituxan today.  Awaiting her labs today-if labs adequate we will proceed with chemotherapy today.   # Nausea/vomitting- G-12- on anti-emetics prn.   #HTN- on Norvasc 5 mg/day. STABLE.   # Insomnia- trial of ambien 5 mg CR.  Discussed regarding risk of falls and nightmares  # DISPOSITION:  # chemo today; D-2 as  planned # follow up in 4 weeks- MD;labs- cbc/cmp; LDH; Rituxan-Benda; D-2-Benda- Dr.B     All questions were answered. The patient  knows to call the clinic with any problems, questions or concerns.    Cammie Sickle, MD 04/04/2020 9:33 AM

## 2020-04-04 NOTE — Progress Notes (Signed)
Per MD ok to proceed with rapid infusion today and no extra pre meds, patient tolerated first infusion well.

## 2020-04-05 ENCOUNTER — Inpatient Hospital Stay: Payer: 59

## 2020-04-05 VITALS — BP 155/68 | HR 83 | Temp 98.9°F | Resp 16

## 2020-04-05 DIAGNOSIS — C8218 Follicular lymphoma grade II, lymph nodes of multiple sites: Secondary | ICD-10-CM

## 2020-04-05 DIAGNOSIS — Z5112 Encounter for antineoplastic immunotherapy: Secondary | ICD-10-CM | POA: Diagnosis not present

## 2020-04-05 MED ORDER — HEPARIN SOD (PORK) LOCK FLUSH 100 UNIT/ML IV SOLN
500.0000 [IU] | Freq: Once | INTRAVENOUS | Status: AC | PRN
  Administered 2020-04-05: 500 [IU]
  Filled 2020-04-05: qty 5

## 2020-04-05 MED ORDER — SODIUM CHLORIDE 0.9 % IV SOLN
Freq: Once | INTRAVENOUS | Status: AC
  Filled 2020-04-05: qty 250

## 2020-04-05 MED ORDER — ACETAMINOPHEN 325 MG PO TABS
650.0000 mg | ORAL_TABLET | Freq: Once | ORAL | Status: AC
  Administered 2020-04-05: 650 mg via ORAL
  Filled 2020-04-05: qty 2

## 2020-04-05 MED ORDER — SODIUM CHLORIDE 0.9 % IV SOLN
90.0000 mg/m2 | Freq: Once | INTRAVENOUS | Status: AC
  Administered 2020-04-05: 175 mg via INTRAVENOUS
  Filled 2020-04-05: qty 7

## 2020-04-05 MED ORDER — HEPARIN SOD (PORK) LOCK FLUSH 100 UNIT/ML IV SOLN
INTRAVENOUS | Status: AC
  Filled 2020-04-05: qty 5

## 2020-04-05 MED ORDER — SODIUM CHLORIDE 0.9 % IV SOLN
10.0000 mg | Freq: Once | INTRAVENOUS | Status: AC
  Administered 2020-04-05: 10 mg via INTRAVENOUS
  Filled 2020-04-05: qty 10

## 2020-04-07 ENCOUNTER — Other Ambulatory Visit: Payer: Self-pay | Admitting: Internal Medicine

## 2020-04-29 ENCOUNTER — Other Ambulatory Visit: Payer: Self-pay | Admitting: *Deleted

## 2020-04-29 ENCOUNTER — Encounter: Payer: Self-pay | Admitting: Internal Medicine

## 2020-04-29 DIAGNOSIS — C8218 Follicular lymphoma grade II, lymph nodes of multiple sites: Secondary | ICD-10-CM

## 2020-04-29 NOTE — Progress Notes (Signed)
Patient called for pre assessment. She states she is having some trouble with her appetite. Food doesn't taste good. Also reports not driving a lot of water. States it just doesn't taste good anymore. Says she is also very weak and tired. Doesn't feel like doing anything. She denies pain or other concerns at this time.

## 2020-05-03 ENCOUNTER — Encounter: Payer: Self-pay | Admitting: Internal Medicine

## 2020-05-03 ENCOUNTER — Inpatient Hospital Stay: Payer: 59

## 2020-05-03 ENCOUNTER — Inpatient Hospital Stay (HOSPITAL_BASED_OUTPATIENT_CLINIC_OR_DEPARTMENT_OTHER): Payer: 59 | Admitting: Internal Medicine

## 2020-05-03 ENCOUNTER — Inpatient Hospital Stay: Payer: 59 | Attending: Internal Medicine

## 2020-05-03 ENCOUNTER — Other Ambulatory Visit: Payer: Self-pay

## 2020-05-03 VITALS — BP 129/87 | HR 87 | Temp 96.9°F | Resp 16 | Ht 63.0 in | Wt 181.4 lb

## 2020-05-03 DIAGNOSIS — G4700 Insomnia, unspecified: Secondary | ICD-10-CM | POA: Insufficient documentation

## 2020-05-03 DIAGNOSIS — R112 Nausea with vomiting, unspecified: Secondary | ICD-10-CM | POA: Diagnosis not present

## 2020-05-03 DIAGNOSIS — R630 Anorexia: Secondary | ICD-10-CM | POA: Diagnosis not present

## 2020-05-03 DIAGNOSIS — C8218 Follicular lymphoma grade II, lymph nodes of multiple sites: Secondary | ICD-10-CM

## 2020-05-03 DIAGNOSIS — R634 Abnormal weight loss: Secondary | ICD-10-CM | POA: Insufficient documentation

## 2020-05-03 DIAGNOSIS — I10 Essential (primary) hypertension: Secondary | ICD-10-CM | POA: Diagnosis not present

## 2020-05-03 DIAGNOSIS — Z5111 Encounter for antineoplastic chemotherapy: Secondary | ICD-10-CM | POA: Diagnosis present

## 2020-05-03 LAB — CBC WITH DIFFERENTIAL/PLATELET
Abs Immature Granulocytes: 0.04 10*3/uL (ref 0.00–0.07)
Basophils Absolute: 0.1 10*3/uL (ref 0.0–0.1)
Basophils Relative: 1 %
Eosinophils Absolute: 0.1 10*3/uL (ref 0.0–0.5)
Eosinophils Relative: 3 %
HCT: 39.4 % (ref 36.0–46.0)
Hemoglobin: 14.1 g/dL (ref 12.0–15.0)
Immature Granulocytes: 1 %
Lymphocytes Relative: 33 %
Lymphs Abs: 1.4 10*3/uL (ref 0.7–4.0)
MCH: 31.5 pg (ref 26.0–34.0)
MCHC: 35.8 g/dL (ref 30.0–36.0)
MCV: 88.1 fL (ref 80.0–100.0)
Monocytes Absolute: 0.9 10*3/uL (ref 0.1–1.0)
Monocytes Relative: 21 %
Neutro Abs: 1.8 10*3/uL (ref 1.7–7.7)
Neutrophils Relative %: 41 %
Platelets: 189 10*3/uL (ref 150–400)
RBC: 4.47 MIL/uL (ref 3.87–5.11)
RDW: 14.3 % (ref 11.5–15.5)
WBC: 4.4 10*3/uL (ref 4.0–10.5)
nRBC: 0 % (ref 0.0–0.2)

## 2020-05-03 LAB — COMPREHENSIVE METABOLIC PANEL
ALT: 26 U/L (ref 0–44)
AST: 34 U/L (ref 15–41)
Albumin: 4.1 g/dL (ref 3.5–5.0)
Alkaline Phosphatase: 86 U/L (ref 38–126)
Anion gap: 9 (ref 5–15)
BUN: 15 mg/dL (ref 8–23)
CO2: 22 mmol/L (ref 22–32)
Calcium: 9.2 mg/dL (ref 8.9–10.3)
Chloride: 108 mmol/L (ref 98–111)
Creatinine, Ser: 1.03 mg/dL — ABNORMAL HIGH (ref 0.44–1.00)
GFR calc Af Amer: >60 mL/min (ref >60)
GFR calc non Af Amer: 58 mL/min — ABNORMAL LOW (ref >60)
Glucose, Bld: 113 mg/dL — ABNORMAL HIGH (ref 70–99)
Potassium: 3.5 mmol/L (ref 3.5–5.1)
Sodium: 139 mmol/L (ref 135–145)
Total Bilirubin: 1 mg/dL (ref 0.3–1.2)
Total Protein: 6.8 g/dL (ref 6.5–8.1)

## 2020-05-03 LAB — LACTATE DEHYDROGENASE: LDH: 170 U/L (ref 98–192)

## 2020-05-03 MED ORDER — ZOLPIDEM TARTRATE ER 6.25 MG PO TBCR: 6.2500 mg | EXTENDED_RELEASE_TABLET | Freq: Every evening | ORAL | 0 refills | Status: DC | PRN

## 2020-05-03 MED ORDER — SODIUM CHLORIDE 0.9 % IV SOLN
90.0000 mg/m2 | Freq: Once | INTRAVENOUS | Status: AC
  Administered 2020-05-03: 175 mg via INTRAVENOUS
  Filled 2020-05-03: qty 7

## 2020-05-03 MED ORDER — SODIUM CHLORIDE 0.9 % IV SOLN
375.0000 mg/m2 | Freq: Once | INTRAVENOUS | Status: AC
  Administered 2020-05-03: 700 mg via INTRAVENOUS
  Filled 2020-05-03: qty 50

## 2020-05-03 MED ORDER — PALONOSETRON HCL INJECTION 0.25 MG/5ML
0.2500 mg | Freq: Once | INTRAVENOUS | Status: AC
  Administered 2020-05-03: 0.25 mg via INTRAVENOUS
  Filled 2020-05-03: qty 5

## 2020-05-03 MED ORDER — SODIUM CHLORIDE 0.9% FLUSH
10.0000 mL | Freq: Once | INTRAVENOUS | Status: AC
  Administered 2020-05-03: 10 mL via INTRAVENOUS
  Filled 2020-05-03: qty 10

## 2020-05-03 MED ORDER — SODIUM CHLORIDE 0.9 % IV SOLN
Freq: Once | INTRAVENOUS | Status: AC
  Filled 2020-05-03: qty 250

## 2020-05-03 MED ORDER — HEPARIN SOD (PORK) LOCK FLUSH 100 UNIT/ML IV SOLN
500.0000 [IU] | Freq: Once | INTRAVENOUS | Status: AC | PRN
  Administered 2020-05-03: 500 [IU]
  Filled 2020-05-03: qty 5

## 2020-05-03 MED ORDER — ACETAMINOPHEN 325 MG PO TABS
650.0000 mg | ORAL_TABLET | Freq: Once | ORAL | Status: AC
  Administered 2020-05-03: 650 mg via ORAL
  Filled 2020-05-03: qty 2

## 2020-05-03 MED ORDER — SODIUM CHLORIDE 0.9 % IV SOLN
10.0000 mg | Freq: Once | INTRAVENOUS | Status: AC
  Administered 2020-05-03: 10 mg via INTRAVENOUS
  Filled 2020-05-03: qty 10

## 2020-05-03 MED ORDER — DIPHENHYDRAMINE HCL 25 MG PO CAPS
50.0000 mg | ORAL_CAPSULE | Freq: Once | ORAL | Status: AC
  Administered 2020-05-03: 50 mg via ORAL
  Filled 2020-05-03: qty 2

## 2020-05-03 MED ORDER — HEPARIN SOD (PORK) LOCK FLUSH 100 UNIT/ML IV SOLN
500.0000 [IU] | Freq: Once | INTRAVENOUS | Status: DC
  Filled 2020-05-03: qty 5

## 2020-05-03 NOTE — Progress Notes (Signed)
[place] CONSULT NOTE  Patient Care Team: Peggye Form, NP as PCP - General (Family Medicine) Herbert Pun, MD as Consulting Physician (General Surgery)  CHIEF COMPLAINTS/PURPOSE OF CONSULTATION:   Oncology History Overview Note  # MAY 2021- Bilateral neck adenopathy right more than left; highly concerning for lymphoproliferative disorder.  Excisional biopsy right neck- [Dr.Cintron];  A. 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   B. 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   # MAY 2021-CBC/CMP normal /  mildly elevated alkaline phosphatase. [PCP; [facility]].  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[place])   03/02/2020 -  Chemotherapy   The patient had dexamethasone (DECADRON) 4 MG tablet, 8 mg, Oral, Daily, 1 of 1 cycle, Start date: --, End date: -- palonosetron (ALOXI) injection 0.25 mg, 0.25 mg, Intravenous,  Once, 3 of 5 cycles Administration: 0.25 mg (03/02/2020), 0.25 mg (04/04/2020), 0.25 mg (05/03/2020) bendamustine (BENDEKA) 175 mg in sodium chloride 0.9 % 50 mL (3.0702 mg/mL) chemo infusion, 90 mg/m2 = 175 mg, Intravenous,  Once, 3 of 5 cycles Administration: 175 mg (03/02/2020), 175 mg (03/03/2020), 175 mg (04/04/2020), 175 mg (04/05/2020), 175 mg (05/03/2020) riTUXimab-pvvr (RUXIENCE) 700 mg in sodium chloride 0.9 % 250 mL (2.1875 mg/mL) infusion, 375 mg/m2 = 700 mg, Intravenous,  Once, 2 of 2 cycles Administration: 700 mg (03/02/2020)  for chemotherapy treatment.     HISTORY OF PRESENTING ILLNESS:  Danielle Lewis 64 y.o.  female  follicular lymphoma  grade 1-2 currently on bendamustine Rituxan is here for follow-up.  Patient denies any shortness of breath or cough.  Does complain of extreme fatigue.  No fevers no chills.  Continues to have intermittent episodes of night sweats.  Sleeping is improved on Ambien.   Review of Systems  Constitutional: Positive for diaphoresis and malaise/fatigue. Negative for chills, fever and weight loss.  HENT: Negative for nosebleeds and sore throat.   Eyes: Negative for double vision.  Respiratory: Negative for cough, hemoptysis, sputum production, shortness of breath and wheezing.   Cardiovascular: Negative for chest pain, palpitations, orthopnea and leg swelling.  Gastrointestinal: Negative for abdominal pain, blood in stool, constipation, diarrhea, heartburn, melena, nausea and vomiting.  Genitourinary: Negative for dysuria, frequency and urgency.  Musculoskeletal: Negative for back pain and joint pain.  Skin: Negative.  Negative for itching and rash.  Neurological: Negative for dizziness, tingling, focal weakness, weakness and headaches.  Endo/Heme/Allergies: Does not bruise/bleed easily.  Psychiatric/Behavioral: Negative for depression. The patient has insomnia. The patient is not nervous/anxious.      MEDICAL HISTORY:  Past Medical History:  Diagnosis Date  . Anxiety   . GERD (gastroesophageal reflux disease)   . Hypertension     SURGICAL HISTORY: Past Surgical History:  Procedure Laterality Date  . ABDOMINAL HYSTERECTOMY    . APPENDECTOMY    . CHOLECYSTECTOMY     28years ago  . LYMPH GLAND EXCISION Right 01/22/2020   Procedure: CERVICAL LYMPH GLAND EXCISION;  Surgeon: Herbert Pun, MD;  Location: [facility] ORS;  Service: General;  Laterality: Right;  .  PORTACATH PLACEMENT N/A 02/24/2020   Procedure: INSERTION PORT-A-CATH;  Surgeon: Herbert Pun, MD;  Location: [facility] ORS;  Service: General;  Laterality: N/A;  . TONSILLECTOMY      SOCIAL HISTORY: Social History    Socioeconomic History  . Marital status: Married    Spouse name: Not on file  . Number of children: Not on file  . Years of education: Not on file  . Highest education level: Not on file  Occupational History  . Not on file  Tobacco Use  . Smoking status: Never Smoker  . Smokeless tobacco: Never Used  Vaping Use  . Vaping Use: Never used  Substance and Sexual Activity  . Alcohol use: Yes    Comment: occassional  . Drug use: Not Currently  . Sexual activity: Not on file  Other Topics Concern  . Not on file  Social History Narrative   Lives in [place]; [place]. Never smoked; wine rarely. Used in work in [employer]. With husband.    Social Determinants of Health   Financial Resource Strain:   . Difficulty of Paying Living Expenses: Not on file  Food Insecurity:   . Worried About Charity fundraiser in the Last Year: Not on file  . Ran Out of Food in the Last Year: Not on file  Transportation Needs:   . Lack of Transportation (Medical): Not on file  . Lack of Transportation (Non-Medical): Not on file  Physical Activity:   . Days of Exercise per Week: Not on file  . Minutes of Exercise per Session: Not on file  Stress:   . Feeling of Stress : Not on file  Social Connections:   . Frequency of Communication with Friends and Family: Not on file  . Frequency of Social Gatherings with Friends and Family: Not on file  . Attends Religious Services: Not on file  . Active Member of Clubs or Organizations: Not on file  . Attends Archivist Meetings: Not on file  . Marital Status: Not on file  Intimate Partner Violence:   . Fear of Current or Ex-Partner: Not on file  . Emotionally Abused: Not on file  . Physically Abused: Not on file  . Sexually Abused: Not on file    FAMILY HISTORY: Family History  Problem Relation Age of Onset  . Cancer Mother        unknown type    ALLERGIES:  is allergic to penicillins.  MEDICATIONS:  Current Outpatient Medications   Medication Sig Dispense Refill  . acyclovir (ZOVIRAX) 400 MG tablet Take 1 tablet (400 mg total) by mouth 2 (two) times daily. One pill a day [to prevent shingles] 60 tablet 6  . amLODipine (NORVASC) 5 MG tablet Take 1 tablet (5 mg total) by mouth daily. 30 tablet 3  . lidocaine-prilocaine (EMLA) cream Apply 1 application topically once as needed for up to 1 dose. Apply the cream generously to the port site 45-60 mins prior to access. 30 g 0  . ondansetron (ZOFRAN) 4 MG tablet TAKE 1 TABLET BY MOUTH EVERY 8 HOURS AS NEEDED FOR NAUSEA FOR VOMITING 20 tablet 0  . prochlorperazine (COMPAZINE) 10 MG tablet Take 1 tablet (10 mg total) by mouth every 6 (six) hours as needed for nausea or vomiting. 40 tablet 1  . zolpidem (AMBIEN CR) 6.25 MG CR tablet Take 1 tablet (6.25 mg total) by mouth at bedtime as needed for sleep. 30 tablet 0   No current facility-administered medications for this visit.      Marland Kitchen  PHYSICAL EXAMINATION: ECOG PERFORMANCE STATUS: 0 - Asymptomatic  Vitals:   05/03/20 0827  BP: 129/87  Pulse: 87  Resp: 16  Temp: (!) 96.9 F (36.1 C)  SpO2: 99%   Filed Weights   05/03/20 0827  Weight: 181 lb 6.4 oz (82.3 kg)    Physical Exam Constitutional:      Comments: Walk independently.  Alone.   HENT:     Head: Normocephalic and atraumatic.     Mouth/Throat:     Pharynx: No oropharyngeal exudate.  Eyes:     Pupils: Pupils are equal, round, and reactive to light.  Neck:     Comments: 2-3 cm right sided lymph nodes noted.  Improved. Cardiovascular:     Rate and Rhythm: Normal rate and regular rhythm.  Pulmonary:     Effort: Pulmonary effort is normal. No respiratory distress.     Breath sounds: Normal breath sounds. No wheezing.  Abdominal:     General: Bowel sounds are normal. There is no distension.     Palpations: Abdomen is soft. There is no mass.     Tenderness: There is no abdominal tenderness. There is no guarding or rebound.  Musculoskeletal:        General:  No tenderness. Normal range of motion.     Cervical back: Normal range of motion and neck supple.  Skin:    General: Skin is warm.  Neurological:     Mental Status: She is alert and oriented to person, place, and time.  Psychiatric:        Mood and Affect: Affect normal.      LABORATORY DATA:  I have reviewed the data as listed Lab Results  Component Value Date   WBC 4.4 05/03/2020   HGB 14.1 05/03/2020   HCT 39.4 05/03/2020   MCV 88.1 05/03/2020   PLT 189 05/03/2020   Recent Labs    03/02/20 0814 04/04/20 0910 05/03/20 0806  NA 140 139 139  K 3.9 3.7 3.5  CL 109 108 108  CO2 21* 21* 22  GLUCOSE 101* 133* 113*  BUN 23 17 15   CREATININE 0.93 0.97 1.03*  CALCIUM 9.1 9.2 9.2  GFRNONAA >60 >60 58*  GFRAA >60 >60 >60  PROT 7.3 6.8 6.8  ALBUMIN 4.2 4.2 4.1  AST 24 30 34  ALT 23 33 26  ALKPHOS 89 86 86  BILITOT 1.0 0.8 1.0    RADIOGRAPHIC STUDIES: I have personally reviewed the radiological images as listed and agreed with the findings in the report. No results found.  ASSESSMENT & PLAN:   Follicular lymphoma grade ii, lymph nodes of multiple sites ([place]) # Follicular lymphoma grade 1-2; above and below the diaphragm; at least stage III18th, May 2021-PET scan lymphadenopathy bulky right neck; left neck; abdominal/retroperitoneal mesenteric and pelvic.  Maximum SUV 11.  Currently on bendamustine Rituxan.  # Proceed with cycle #3 bendamustine Rituxan today. Labs today reviewed;  acceptable for treatment today.   # Nausea/vomitting- G-12- on anti-emetics prn.   #HTN- on Norvasc 5 mg/day. STABLE.   # Insomnia- stable; refilled ambien 6.25 mg.  # DISPOSITION:  # referral to [facility] re: weight loss/poor apetite # chemo today; D-2 as planned # follow up in 4 weeks- MD;labs- cbc/cmp; LDH; Rituxan-Benda;PET scan prior- D-2-Benda- Dr.B     All questions were answered. The patient knows to call the clinic with any problems, questions or concerns.    Cammie Sickle, MD 05/03/2020 5:37 PM

## 2020-05-03 NOTE — Assessment & Plan Note (Addendum)
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  Maximum SUV 11.  Currently on bendamustine Rituxan.  # Proceed with cycle #3 bendamustine Rituxan today. Labs today reviewed;  acceptable for treatment today.   # Nausea/vomitting- G-12- on anti-emetics prn.   #HTN- on Norvasc 5 mg/day. STABLE.   # Insomnia- stable; refilled ambien 6.25 mg.  # DISPOSITION:  # referral to [facility] re: weight loss/poor apetite # chemo today; D-2 as planned # follow up in 4 weeks- MD;labs- cbc/cmp; LDH; Rituxan-Benda;PET scan prior- D-2-Benda- Dr.B

## 2020-05-04 ENCOUNTER — Inpatient Hospital Stay: Payer: 59

## 2020-05-04 VITALS — BP 123/72 | HR 88 | Temp 95.8°F | Resp 16

## 2020-05-04 DIAGNOSIS — Z5111 Encounter for antineoplastic chemotherapy: Secondary | ICD-10-CM | POA: Diagnosis not present

## 2020-05-04 DIAGNOSIS — C8218 Follicular lymphoma grade II, lymph nodes of multiple sites: Secondary | ICD-10-CM

## 2020-05-04 MED ORDER — SODIUM CHLORIDE 0.9 % IV SOLN
10.0000 mg | Freq: Once | INTRAVENOUS | Status: AC
  Administered 2020-05-04: 10 mg via INTRAVENOUS
  Filled 2020-05-04: qty 10

## 2020-05-04 MED ORDER — SODIUM CHLORIDE 0.9 % IV SOLN
90.0000 mg/m2 | Freq: Once | INTRAVENOUS | Status: AC
  Administered 2020-05-04: 175 mg via INTRAVENOUS
  Filled 2020-05-04: qty 7

## 2020-05-04 MED ORDER — HEPARIN SOD (PORK) LOCK FLUSH 100 UNIT/ML IV SOLN
INTRAVENOUS | Status: AC
  Filled 2020-05-04: qty 5

## 2020-05-04 MED ORDER — HEPARIN SOD (PORK) LOCK FLUSH 100 UNIT/ML IV SOLN
500.0000 [IU] | Freq: Once | INTRAVENOUS | Status: AC | PRN
  Administered 2020-05-04: 500 [IU]
  Filled 2020-05-04: qty 5

## 2020-05-04 MED ORDER — SODIUM CHLORIDE 0.9 % IV SOLN
Freq: Once | INTRAVENOUS | Status: AC
  Filled 2020-05-04: qty 250

## 2020-05-04 MED ORDER — SODIUM CHLORIDE 0.9% FLUSH
10.0000 mL | INTRAVENOUS | Status: DC | PRN
  Administered 2020-05-04: 10 mL
  Filled 2020-05-04: qty 10

## 2020-05-16 ENCOUNTER — Inpatient Hospital Stay: Payer: 59

## 2020-05-16 NOTE — Progress Notes (Signed)
Nutrition Assessment:  Referral for weight loss, poor appetite  64 year old female with follicular lymphoma, followed by Dr. Rogue Bussing.  Past medical history of GERD, HTN, anxiety.  Patient receiving chemotherapy.   Spoke with patient via phone for nutrition assessment.  Patient reports that her appetite is effected by food not tasting good to her.  Reports [employer] pinto beans and chicken (spicy) she can taste. Also pizza and cheese puffs.  Reports that this am ate 1 pancake, drank 1 1/2 cups water and ate ice pop.  Has started drinking boost and drinks one about every 3-4 days.  Does not really like it.  States that she nibbles during the day.    Reports some nausea on 3rd and 4th day after chemo that is controlled with medications  Medications: zofran, compazine  Labs: reviewed  Anthropometrics:   Height: 63 inches Weight: 181 lb 9/7 UBW: 191 lb before treatment started (May/June 2021) Noted 191 lb June 30/2021 BMI: 32  5% weight loss in the last 2 1/2 months, concerning   Estimated Energy Needs  Kcals: 1800-2050 Protein: 90-102 g Fluid: > 1.8 L  NUTRITION DIAGNOSIS: Inadequate oral intake related to cancer related treatment side effects as evidenced by 5% weight loss in 2 1/2 months, decreased intake    INTERVENTION:  Discussed strategies to help with taste change.  Will mail handout Encouraged "nibbling" mini meal q 2 hours Encouraged including protein source at every snack. Contact information mailed    MONITORING, EVALUATION, GOAL: weight trends, intake   NEXT VISIT: October 11, phone f/u  Abir Eroh B. Zenia Resides, [place], [place] Registered Dietitian [phone] (mobile)

## 2020-05-30 ENCOUNTER — Encounter: Payer: Self-pay | Admitting: Internal Medicine

## 2020-05-30 NOTE — Progress Notes (Signed)
Patient called for pre assessment. She states she still doesn't have much of an appetite. She denies any pain or other concerns at this time. She request refill for ambien and amlodipine.

## 2020-05-31 ENCOUNTER — Inpatient Hospital Stay: Payer: 59 | Attending: Internal Medicine

## 2020-05-31 ENCOUNTER — Inpatient Hospital Stay (HOSPITAL_BASED_OUTPATIENT_CLINIC_OR_DEPARTMENT_OTHER): Payer: 59 | Admitting: Internal Medicine

## 2020-05-31 ENCOUNTER — Other Ambulatory Visit: Payer: Self-pay

## 2020-05-31 ENCOUNTER — Inpatient Hospital Stay: Payer: 59

## 2020-05-31 DIAGNOSIS — C8218 Follicular lymphoma grade II, lymph nodes of multiple sites: Secondary | ICD-10-CM

## 2020-05-31 DIAGNOSIS — K219 Gastro-esophageal reflux disease without esophagitis: Secondary | ICD-10-CM | POA: Diagnosis not present

## 2020-05-31 DIAGNOSIS — C8211 Follicular lymphoma grade II, lymph nodes of head, face, and neck: Secondary | ICD-10-CM | POA: Insufficient documentation

## 2020-05-31 DIAGNOSIS — I951 Orthostatic hypotension: Secondary | ICD-10-CM | POA: Insufficient documentation

## 2020-05-31 DIAGNOSIS — I700 Atherosclerosis of aorta: Secondary | ICD-10-CM | POA: Diagnosis not present

## 2020-05-31 DIAGNOSIS — R112 Nausea with vomiting, unspecified: Secondary | ICD-10-CM | POA: Diagnosis not present

## 2020-05-31 DIAGNOSIS — F419 Anxiety disorder, unspecified: Secondary | ICD-10-CM | POA: Insufficient documentation

## 2020-05-31 DIAGNOSIS — Z79899 Other long term (current) drug therapy: Secondary | ICD-10-CM | POA: Diagnosis not present

## 2020-05-31 DIAGNOSIS — R748 Abnormal levels of other serum enzymes: Secondary | ICD-10-CM | POA: Insufficient documentation

## 2020-05-31 DIAGNOSIS — Z9221 Personal history of antineoplastic chemotherapy: Secondary | ICD-10-CM | POA: Diagnosis not present

## 2020-05-31 DIAGNOSIS — G4700 Insomnia, unspecified: Secondary | ICD-10-CM | POA: Diagnosis not present

## 2020-05-31 DIAGNOSIS — R61 Generalized hyperhidrosis: Secondary | ICD-10-CM | POA: Diagnosis not present

## 2020-05-31 DIAGNOSIS — K760 Fatty (change of) liver, not elsewhere classified: Secondary | ICD-10-CM | POA: Diagnosis not present

## 2020-05-31 DIAGNOSIS — I10 Essential (primary) hypertension: Secondary | ICD-10-CM | POA: Diagnosis not present

## 2020-05-31 DIAGNOSIS — Z95828 Presence of other vascular implants and grafts: Secondary | ICD-10-CM

## 2020-05-31 DIAGNOSIS — R42 Dizziness and giddiness: Secondary | ICD-10-CM | POA: Diagnosis not present

## 2020-05-31 LAB — CBC WITH DIFFERENTIAL/PLATELET
Abs Immature Granulocytes: 0.03 10*3/uL (ref 0.00–0.07)
Basophils Absolute: 0 10*3/uL (ref 0.0–0.1)
Basophils Relative: 1 %
Eosinophils Absolute: 0.1 10*3/uL (ref 0.0–0.5)
Eosinophils Relative: 5 %
HCT: 38.6 % (ref 36.0–46.0)
Hemoglobin: 13.9 g/dL (ref 12.0–15.0)
Immature Granulocytes: 1 %
Lymphocytes Relative: 30 %
Lymphs Abs: 0.8 10*3/uL (ref 0.7–4.0)
MCH: 32 pg (ref 26.0–34.0)
MCHC: 36 g/dL (ref 30.0–36.0)
MCV: 88.9 fL (ref 80.0–100.0)
Monocytes Absolute: 1 10*3/uL (ref 0.1–1.0)
Monocytes Relative: 38 %
Neutro Abs: 0.7 10*3/uL — ABNORMAL LOW (ref 1.7–7.7)
Neutrophils Relative %: 25 %
Platelets: 168 10*3/uL (ref 150–400)
RBC: 4.34 MIL/uL (ref 3.87–5.11)
RDW: 13.7 % (ref 11.5–15.5)
WBC: 2.8 10*3/uL — ABNORMAL LOW (ref 4.0–10.5)
nRBC: 0 % (ref 0.0–0.2)

## 2020-05-31 LAB — COMPREHENSIVE METABOLIC PANEL
ALT: 28 U/L (ref 0–44)
AST: 33 U/L (ref 15–41)
Albumin: 4.2 g/dL (ref 3.5–5.0)
Alkaline Phosphatase: 89 U/L (ref 38–126)
Anion gap: 11 (ref 5–15)
BUN: 15 mg/dL (ref 8–23)
CO2: 20 mmol/L — ABNORMAL LOW (ref 22–32)
Calcium: 9.2 mg/dL (ref 8.9–10.3)
Chloride: 107 mmol/L (ref 98–111)
Creatinine, Ser: 0.85 mg/dL (ref 0.44–1.00)
GFR calc non Af Amer: >60 mL/min (ref >60)
Glucose, Bld: 100 mg/dL — ABNORMAL HIGH (ref 70–99)
Potassium: 3.8 mmol/L (ref 3.5–5.1)
Sodium: 138 mmol/L (ref 135–145)
Total Bilirubin: 1.1 mg/dL (ref 0.3–1.2)
Total Protein: 6.9 g/dL (ref 6.5–8.1)

## 2020-05-31 LAB — LACTATE DEHYDROGENASE: LDH: 211 U/L — ABNORMAL HIGH (ref 98–192)

## 2020-05-31 MED ORDER — HEPARIN SOD (PORK) LOCK FLUSH 100 UNIT/ML IV SOLN
INTRAVENOUS | Status: AC
  Filled 2020-05-31: qty 5

## 2020-05-31 MED ORDER — SODIUM CHLORIDE 0.9% FLUSH
10.0000 mL | Freq: Once | INTRAVENOUS | Status: AC
  Administered 2020-05-31: 10 mL via INTRAVENOUS
  Filled 2020-05-31: qty 10

## 2020-05-31 MED ORDER — AMLODIPINE BESYLATE 5 MG PO TABS: 5.0000 mg | ORAL_TABLET | Freq: Every day | ORAL | 3 refills | Status: DC

## 2020-05-31 MED ORDER — HEPARIN SOD (PORK) LOCK FLUSH 100 UNIT/ML IV SOLN
500.0000 [IU] | Freq: Once | INTRAVENOUS | Status: AC
  Administered 2020-05-31: 500 [IU]
  Filled 2020-05-31: qty 5

## 2020-05-31 MED ORDER — ZOLPIDEM TARTRATE ER 6.25 MG PO TBCR: 6.2500 mg | EXTENDED_RELEASE_TABLET | Freq: Every evening | ORAL | 0 refills | Status: DC | PRN

## 2020-05-31 NOTE — Progress Notes (Signed)
[place] CONSULT NOTE  Patient Care Team: Peggye Form, NP as PCP - General (Family Medicine) Herbert Pun, MD as Consulting Physician (General Surgery)  CHIEF COMPLAINTS/PURPOSE OF CONSULTATION:   Oncology History Overview Note  # MAY 2021- Bilateral neck adenopathy right more than left; highly concerning for lymphoproliferative disorder.  Excisional biopsy right neck- [Dr.Cintron];  A. 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   B. 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   # MAY 2021-CBC/CMP normal /  mildly elevated alkaline phosphatase. [PCP; [facility]].  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[place])   03/02/2020 -  Chemotherapy   The patient had dexamethasone (DECADRON) 4 MG tablet, 8 mg, Oral, Daily, 1 of 1 cycle, Start date: --, End date: -- palonosetron (ALOXI) injection 0.25 mg, 0.25 mg, Intravenous,  Once, 3 of 6 cycles Administration: 0.25 mg (03/02/2020), 0.25 mg (04/04/2020), 0.25 mg (05/03/2020) bendamustine (BENDEKA) 175 mg in sodium chloride 0.9 % 50 mL (3.0702 mg/mL) chemo infusion, 90 mg/m2 = 175 mg, Intravenous,  Once, 3 of 6 cycles Administration: 175 mg (03/02/2020), 175 mg (03/03/2020), 175 mg (04/04/2020), 175 mg (04/05/2020), 175 mg (05/03/2020), 175 mg (05/04/2020) riTUXimab-pvvr (RUXIENCE) 700 mg in sodium chloride 0.9 % 250 mL (2.1875 mg/mL) infusion, 375 mg/m2 = 700 mg, Intravenous,  Once, 2 of 2 cycles Administration: 700 mg (03/02/2020)  for chemotherapy treatment.     HISTORY OF PRESENTING ILLNESS:  Danielle Lewis 64 y.o.  female   follicular lymphoma grade 1-2 currently on bendamustine Rituxan is here for follow-up.  Patient denies any shortness of breath or cough.  She continues to have episodes of night sweats.   No fever no chills.  No worsening cough.  Sleeping improved on Ambien.  Requesting refill.  Review of Systems  Constitutional: Positive for diaphoresis and malaise/fatigue. Negative for chills, fever and weight loss.  HENT: Negative for nosebleeds and sore throat.   Eyes: Negative for double vision.  Respiratory: Negative for cough, hemoptysis, sputum production, shortness of breath and wheezing.   Cardiovascular: Negative for chest pain, palpitations, orthopnea and leg swelling.  Gastrointestinal: Negative for abdominal pain, blood in stool, constipation, diarrhea, heartburn, melena, nausea and vomiting.  Genitourinary: Negative for dysuria, frequency and urgency.  Musculoskeletal: Negative for back pain and joint pain.  Skin: Negative.  Negative for itching and rash.  Neurological: Negative for dizziness, tingling, focal weakness, weakness and headaches.  Endo/Heme/Allergies: Does not bruise/bleed easily.  Psychiatric/Behavioral: Negative for depression. The patient has insomnia. The patient is not nervous/anxious.      MEDICAL HISTORY:  Past Medical History:  Diagnosis Date  . Anxiety   . GERD (gastroesophageal reflux disease)   . Hypertension     SURGICAL HISTORY: Past Surgical History:  Procedure Laterality Date  . ABDOMINAL HYSTERECTOMY    . APPENDECTOMY    . CHOLECYSTECTOMY     28years ago  . LYMPH GLAND EXCISION Right 01/22/2020   Procedure: CERVICAL LYMPH GLAND EXCISION;  Surgeon: Herbert Pun, MD;  Location: [facility] ORS;  Service: General;  Laterality: Right;  . PORTACATH PLACEMENT N/A 02/24/2020   Procedure: INSERTION PORT-A-CATH;  Surgeon: Herbert Pun, MD;  Location: [facility] ORS;  Service: General;  Laterality: N/A;  . TONSILLECTOMY      SOCIAL HISTORY: Social  History   Socioeconomic History  . Marital status: Married    Spouse name: Not on file  . Number of children: Not on file  . Years of education: Not on file  . Highest education level: Not on file  Occupational History  . Not on file  Tobacco Use  . Smoking status: Never Smoker  . Smokeless tobacco: Never Used  Vaping Use  . Vaping Use: Never used  Substance and Sexual Activity  . Alcohol use: Yes    Comment: occassional  . Drug use: Not Currently  . Sexual activity: Not on file  Other Topics Concern  . Not on file  Social History Narrative   Lives in [place]; [place]. Never smoked; wine rarely. Used in work in [employer]. With husband.    Social Determinants of Health   Financial Resource Strain:   . Difficulty of Paying Living Expenses: Not on file  Food Insecurity:   . Worried About Charity fundraiser in the Last Year: Not on file  . Ran Out of Food in the Last Year: Not on file  Transportation Needs:   . Lack of Transportation (Medical): Not on file  . Lack of Transportation (Non-Medical): Not on file  Physical Activity:   . Days of Exercise per Week: Not on file  . Minutes of Exercise per Session: Not on file  Stress:   . Feeling of Stress : Not on file  Social Connections:   . Frequency of Communication with Friends and Family: Not on file  . Frequency of Social Gatherings with Friends and Family: Not on file  . Attends Religious Services: Not on file  . Active Member of Clubs or Organizations: Not on file  . Attends Archivist Meetings: Not on file  . Marital Status: Not on file  Intimate Partner Violence:   . Fear of Current or Ex-Partner: Not on file  . Emotionally Abused: Not on file  . Physically Abused: Not on file  . Sexually Abused: Not on file    FAMILY HISTORY: Family History  Problem Relation Age of Onset  . Cancer Mother        unknown type    ALLERGIES:  is allergic to penicillins.  MEDICATIONS:  Current Outpatient  Medications  Medication Sig Dispense Refill  . acyclovir (ZOVIRAX) 400 MG tablet Take 1 tablet (400 mg total) by mouth 2 (two) times daily. One pill a day [to prevent shingles] 60 tablet 6  . amLODipine (NORVASC) 5 MG tablet Take 1 tablet (5 mg total) by mouth daily. 30 tablet 3  . lidocaine-prilocaine (EMLA) cream Apply 1 application topically once as needed for up to 1 dose. Apply the cream generously to the port site 45-60 mins prior to access. 30 g 0  . ondansetron (ZOFRAN) 4 MG tablet TAKE 1 TABLET BY MOUTH EVERY 8 HOURS AS NEEDED FOR NAUSEA FOR VOMITING 20 tablet 0  . prochlorperazine (COMPAZINE) 10 MG tablet Take 1 tablet (10 mg total) by mouth every 6 (six) hours as needed for nausea or vomiting. 40 tablet 1  . zolpidem (AMBIEN CR) 6.25 MG CR tablet Take 1 tablet (6.25 mg total) by mouth at bedtime as needed for sleep. 30 tablet 0   No current facility-administered medications for  this visit.      Marland Kitchen  PHYSICAL EXAMINATION: ECOG PERFORMANCE STATUS: 0 - Asymptomatic  Vitals:   05/31/20 0839  BP: 134/84  Pulse: 85  Resp: 16  Temp: (!) 96.2 F (35.7 C)  SpO2: 99%   Filed Weights   05/31/20 0839  Weight: 180 lb (81.6 kg)    Physical Exam Constitutional:      Comments: Walk independently.  Alone.   HENT:     Head: Normocephalic and atraumatic.     Mouth/Throat:     Pharynx: No oropharyngeal exudate.  Eyes:     Pupils: Pupils are equal, round, and reactive to light.  Neck:     Comments: Neck lymphadenopathy improved. Cardiovascular:     Rate and Rhythm: Normal rate and regular rhythm.  Pulmonary:     Effort: Pulmonary effort is normal. No respiratory distress.     Breath sounds: Normal breath sounds. No wheezing.  Abdominal:     General: Bowel sounds are normal. There is no distension.     Palpations: Abdomen is soft. There is no mass.     Tenderness: There is no abdominal tenderness. There is no guarding or rebound.  Musculoskeletal:        General: No  tenderness. Normal range of motion.     Cervical back: Normal range of motion and neck supple.  Skin:    General: Skin is warm.  Neurological:     Mental Status: She is alert and oriented to person, place, and time.  Psychiatric:        Mood and Affect: Affect normal.      LABORATORY DATA:  I have reviewed the data as listed Lab Results  Component Value Date   WBC 2.8 (L) 05/31/2020   HGB 13.9 05/31/2020   HCT 38.6 05/31/2020   MCV 88.9 05/31/2020   PLT 168 05/31/2020   Recent Labs    03/02/20 0814 03/02/20 0814 04/04/20 0910 05/03/20 0806 05/31/20 0824  NA 140   < > 139 139 138  K 3.9   < > 3.7 3.5 3.8  CL 109   < > 108 108 107  CO2 21*   < > 21* 22 20*  GLUCOSE 101*   < > 133* 113* 100*  BUN 23   < > 17 15 15   CREATININE 0.93   < > 0.97 1.03* 0.85  CALCIUM 9.1   < > 9.2 9.2 9.2  GFRNONAA >60   < > >60 58* >60  GFRAA >60  --  >60 >60  --   PROT 7.3   < > 6.8 6.8 6.9  ALBUMIN 4.2   < > 4.2 4.1 4.2  AST 24   < > 30 34 33  ALT 23   < > 33 26 28  ALKPHOS 89   < > 86 86 89  BILITOT 1.0   < > 0.8 1.0 1.1   < > = values in this interval not displayed.    RADIOGRAPHIC STUDIES: I have personally reviewed the radiological images as listed and agreed with the findings in the report. No results found.  ASSESSMENT & PLAN:   Follicular lymphoma grade ii, lymph nodes of multiple sites ([place]) # Follicular lymphoma grade 1-2; above and below the diaphragm; at least stage III18th, May 2021-PET scan lymphadenopathy bulky right neck; left neck; abdominal/retroperitoneal mesenteric and pelvic.  Maximum SUV 11.  Currently on bendamustine Rituxan s/p 3 cycles; awaiting restaging PET scan  # HOLD  with cycle #4  bendamustine Rituxan today. Labs today reviewed;  UNacceptable for treatment today-ANC- 0.6. will add growth factor support with cycle #4.  # Nausea/vomitting- G-12- on anti-emetics prn.   #HTN- on Norvasc 5 mg/day. STABLE.   # Insomnia- stable; refilled ambien 6.25  mg.  # DISPOSITION:  # HOLD chemo this week; de-access # follow up in 2 weeks- MD;labs- cbc/cmp; LDH; Rituxan-Benda;PET scan [as schedulde on 10/14] prior- D-2-Benda- Dr.B     All questions were answered. The patient knows to call the clinic with any problems, questions or concerns.    Cammie Sickle, MD 05/31/2020 9:47 AM

## 2020-05-31 NOTE — Assessment & Plan Note (Addendum)
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  Maximum SUV 11.  Currently on bendamustine Rituxan s/p 3 cycles; awaiting restaging PET scan  # HOLD  with cycle #4 bendamustine Rituxan today. Labs today reviewed;  UNacceptable for treatment today-ANC- 0.6. will add growth factor support with cycle #4.  # Nausea/vomitting- G-12- on anti-emetics prn.   #HTN- on Norvasc 5 mg/day. STABLE.   # Insomnia- stable; refilled ambien 6.25 mg.  # DISPOSITION:  # HOLD chemo this week; de-access # follow up in 2 weeks- MD;labs- cbc/cmp; LDH; Rituxan-Benda;PET scan [as schedulde on 10/14] prior- D-2-Benda- Dr.B

## 2020-06-01 ENCOUNTER — Inpatient Hospital Stay: Payer: 59

## 2020-06-06 ENCOUNTER — Other Ambulatory Visit: Payer: Self-pay

## 2020-06-06 ENCOUNTER — Inpatient Hospital Stay: Payer: 59

## 2020-06-06 NOTE — Progress Notes (Signed)
Nutrition Follow-up:  Patient with follicular lymphoma, followed by Dr. Jacinto Reap.  Patient receiving chemotherapy.   Spoke with patient via phone for nutrition follow-up.  Patient reports that her appetite is about the same.  Reports that she tries to "nibble" during the day.  Usually eats cereal for breakfast.  Planning to cook supper tonight.  Does not like oral nutrition supplements but has been drinking boost shake daily.  Likes to snack on cashews, fruit cocktail, apples, crackers, candy bar.    Did receive handout on taste changes.  Medications: reviewed  Labs: reviewed  Anthropometrics:   Weight 180 lb on 10/5 stable from 181 lb on 9/7  191 lb prior to starting treatment   NUTRITION DIAGNOSIS: Inadequate oral intake stable   INTERVENTION:  Encouraged high protein food with each snack Will mail shake recipes for patient to try.  Has new nutribullet that she has never used.      MONITORING, EVALUATION, GOAL: weight trends, intake   NEXT VISIT: Nov 8 phone f/u  Remingtyn Depaola B. Zenia Resides, [place], [place] Registered Dietitian [phone] (mobile)

## 2020-06-09 ENCOUNTER — Encounter
Admission: RE | Admit: 2020-06-09 | Discharge: 2020-06-09 | Disposition: A | Discharge status: Home / Self Care | Payer: 59 | Source: Ambulatory Visit | Attending: Internal Medicine | Admitting: Internal Medicine

## 2020-06-09 ENCOUNTER — Other Ambulatory Visit: Payer: Self-pay

## 2020-06-09 DIAGNOSIS — C8218 Follicular lymphoma grade II, lymph nodes of multiple sites: Secondary | ICD-10-CM | POA: Diagnosis present

## 2020-06-09 LAB — GLUCOSE, CAPILLARY: Glucose-Capillary: 88 mg/dL (ref 70–99)

## 2020-06-09 IMAGING — CT NM PET TUM IMG RESTAG (PS) SKULL BASE T - THIGH
1 of 10 series · 1 of 25 positions shown · non-contrast
Comparison: [DATE]

CLINICAL DATA: Subsequent treatment strategy for lymphoma status
post chemotherapy.

EXAM:
NUCLEAR MEDICINE PET SKULL BASE TO THIGH
TECHNIQUE: 9.9 mCi F-18 FDG was injected intravenously. Full-ring PET imaging
was performed from the skull base to thigh after the radiotracer. CT
data was obtained and used for attenuation correction and anatomic
localization.
Fasting blood glucose: 88 mg/dl

[Series 3: ct wb 5.0 b30f · axial · 5.0mm · 0.98mm/px · 1 of 290 slices shown]
[im 290/290  brain]
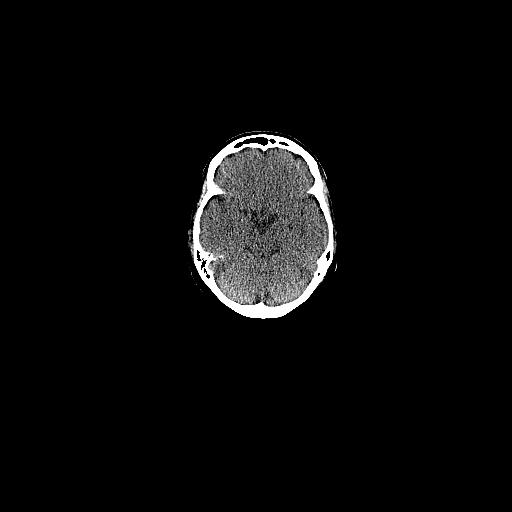

[1 of 25 positions shown; findings below may reference images not displayed]

FINDINGS: Mediastinal blood pool activity: SUV max

Liver activity: SUV max

NECK: There is been interval resolution of previous FDG avid
cervical adenopathy.

Incidental CT findings: none

CHEST: No hypermetabolic mediastinal or hilar nodes. No suspicious
pulmonary nodules on the CT scan.

Incidental CT findings: none

ABDOMEN/PELVIS: No FDG avid lymph nodes within the abdomen or
pelvis.

The index mesenteric lymph node measures 0.9 cm and has an SUV max
of 2.0, image 139/3.

Index left retroperitoneal node measures 0.5 cm and has an SUV max
of 2.16, image 158/3.

The index pre caval lymph node measures 0.6 cm and has an SUV max of
1.44, image 159/3.

Index left common iliac lymph node measures 0.9 cm and has an SUV
max of 2.03, image 177/3.

Index right external iliac node measures 0.7 cm and has an SUV max
of 1.55, image 216/3.

Incidental CT findings: Unchanged cystic structure within the right
adnexa measuring 2.7 cm, image 222/3. Aortic atherosclerosis and
hepatic steatosis.

SKELETON: No focal hypermetabolic activity to suggest skeletal
metastasis.

Incidental CT findings: none
IMPRESSION: 1. Interval resolution of previous FDG avid adenopathy within the
neck, abdomen, and pelvis. [HOSPITAL] criteria 2.
2. Hepatic steatosis
3.  Aortic Atherosclerosis ([RV]-[RV]).

## 2020-06-09 MED ORDER — FLUDEOXYGLUCOSE F - 18 (FDG) INJECTION
9.3000 | Freq: Once | INTRAVENOUS | Status: AC | PRN
  Administered 2020-06-09: 9.9 via INTRAVENOUS

## 2020-06-14 ENCOUNTER — Other Ambulatory Visit: Payer: Self-pay

## 2020-06-14 ENCOUNTER — Inpatient Hospital Stay (HOSPITAL_BASED_OUTPATIENT_CLINIC_OR_DEPARTMENT_OTHER): Payer: 59 | Admitting: Internal Medicine

## 2020-06-14 ENCOUNTER — Encounter: Payer: Self-pay | Admitting: Internal Medicine

## 2020-06-14 ENCOUNTER — Inpatient Hospital Stay: Payer: 59

## 2020-06-14 ENCOUNTER — Other Ambulatory Visit: Payer: Self-pay | Admitting: Internal Medicine

## 2020-06-14 DIAGNOSIS — C8211 Follicular lymphoma grade II, lymph nodes of head, face, and neck: Secondary | ICD-10-CM | POA: Diagnosis not present

## 2020-06-14 DIAGNOSIS — C8218 Follicular lymphoma grade II, lymph nodes of multiple sites: Secondary | ICD-10-CM

## 2020-06-14 LAB — COMPREHENSIVE METABOLIC PANEL
ALT: 25 U/L (ref 0–44)
AST: 33 U/L (ref 15–41)
Albumin: 4.1 g/dL (ref 3.5–5.0)
Alkaline Phosphatase: 84 U/L (ref 38–126)
Anion gap: 8 (ref 5–15)
BUN: 14 mg/dL (ref 8–23)
CO2: 22 mmol/L (ref 22–32)
Calcium: 9.2 mg/dL (ref 8.9–10.3)
Chloride: 109 mmol/L (ref 98–111)
Creatinine, Ser: 0.94 mg/dL (ref 0.44–1.00)
GFR, Estimated: >60 mL/min (ref >60)
Glucose, Bld: 130 mg/dL — ABNORMAL HIGH (ref 70–99)
Potassium: 3.4 mmol/L — ABNORMAL LOW (ref 3.5–5.1)
Sodium: 139 mmol/L (ref 135–145)
Total Bilirubin: 0.8 mg/dL (ref 0.3–1.2)
Total Protein: 6.9 g/dL (ref 6.5–8.1)

## 2020-06-14 LAB — LACTATE DEHYDROGENASE: LDH: 256 U/L — ABNORMAL HIGH (ref 98–192)

## 2020-06-14 LAB — CBC WITH DIFFERENTIAL/PLATELET
Abs Immature Granulocytes: 0.2 K/uL — ABNORMAL HIGH (ref 0.00–0.07)
Basophils Absolute: 0 K/uL (ref 0.0–0.1)
Basophils Relative: 1 %
Eosinophils Absolute: 0.2 K/uL (ref 0.0–0.5)
Eosinophils Relative: 5 %
HCT: 39.4 % (ref 36.0–46.0)
Hemoglobin: 14.1 g/dL (ref 12.0–15.0)
Immature Granulocytes: 5 %
Lymphocytes Relative: 21 %
Lymphs Abs: 0.9 K/uL (ref 0.7–4.0)
MCH: 31.9 pg (ref 26.0–34.0)
MCHC: 35.8 g/dL (ref 30.0–36.0)
MCV: 89.1 fL (ref 80.0–100.0)
Monocytes Absolute: 0.6 K/uL (ref 0.1–1.0)
Monocytes Relative: 14 %
Neutro Abs: 2.2 K/uL (ref 1.7–7.7)
Neutrophils Relative %: 54 %
Platelets: 205 K/uL (ref 150–400)
RBC: 4.42 MIL/uL (ref 3.87–5.11)
RDW: 13.3 % (ref 11.5–15.5)
WBC: 4.1 K/uL (ref 4.0–10.5)
nRBC: 0 % (ref 0.0–0.2)

## 2020-06-14 MED ORDER — SODIUM CHLORIDE 0.9% FLUSH
10.0000 mL | Freq: Once | INTRAVENOUS | Status: AC
  Administered 2020-06-14: 10 mL via INTRAVENOUS
  Filled 2020-06-14: qty 10

## 2020-06-14 MED ORDER — HEPARIN SOD (PORK) LOCK FLUSH 100 UNIT/ML IV SOLN
500.0000 [IU] | Freq: Once | INTRAVENOUS | Status: AC
  Administered 2020-06-14: 500 [IU] via INTRAVENOUS
  Filled 2020-06-14: qty 5

## 2020-06-14 MED ORDER — SODIUM CHLORIDE 0.9 % IV SOLN
Freq: Once | INTRAVENOUS | Status: AC
  Administered 2020-06-14: 1000 mL via INTRAVENOUS
  Filled 2020-06-14: qty 250

## 2020-06-14 MED ORDER — HEPARIN SOD (PORK) LOCK FLUSH 100 UNIT/ML IV SOLN
INTRAVENOUS | Status: AC
  Filled 2020-06-14: qty 5

## 2020-06-14 NOTE — Progress Notes (Signed)
[facility] CONSULT NOTE  Patient Care Team: Peggye Form, NP as PCP - General (Family Medicine) Herbert Pun, MD as Consulting Physician (General Surgery)  CHIEF COMPLAINTS/PURPOSE OF CONSULTATION:   Oncology History Overview Note  # MAY 2021- Bilateral neck adenopathy right more than left; highly concerning for lymphoproliferative disorder.  Excisional biopsy right neck- [Dr.Cintron];  A. 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   B. 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   # MAY 2021-CBC/CMP normal /  mildly elevated alkaline phosphatase. [PCP; [facility]].  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[place])   03/02/2020 -  Chemotherapy   The patient had dexamethasone (DECADRON) 4 MG tablet, 8 mg, Oral, Daily, 1 of 1 cycle, Start date: --, End date: -- palonosetron (ALOXI) injection 0.25 mg, 0.25 mg, Intravenous,  Once, 3 of 6 cycles Administration: 0.25 mg (03/02/2020), 0.25 mg (04/04/2020), 0.25 mg (05/03/2020) bendamustine (BENDEKA) 175 mg in sodium chloride 0.9 % 50 mL (3.0702 mg/mL) chemo infusion, 90 mg/m2 = 175 mg, Intravenous,  Once, 3 of 6 cycles Administration: 175 mg (03/02/2020), 175 mg (03/03/2020), 175 mg (04/04/2020), 175 mg (04/05/2020), 175 mg (05/03/2020), 175 mg (05/04/2020) riTUXimab-pvvr (RUXIENCE) 700 mg in sodium chloride 0.9 % 250 mL (2.1875 mg/mL) infusion, 375 mg/m2 = 700 mg, Intravenous,  Once, 2 of 2 cycles Administration: 700 mg (03/02/2020)  for chemotherapy treatment.     HISTORY OF PRESENTING ILLNESS:  Danielle Lewis 64 y.o.  female   follicular lymphoma grade 1-2 currently on bendamustine Rituxan is here for follow-up/review results of the PET scan.  Chemotherapy was held 2 weeks ago because of neutropenia.  Patient did not receive any growth factor in the interim.  No fevers or chills.  Patient complains of lightheaded feeling especially in standing.  Patient denies any nausea vomiting diarrhea.  No changes in medication.   Appetite is good.  Continues to have episodes of night sweats.  Review of Systems  Constitutional: Positive for diaphoresis and malaise/fatigue. Negative for chills, fever and weight loss.  HENT: Negative for nosebleeds and sore throat.   Eyes: Negative for double vision.  Respiratory: Negative for cough, hemoptysis, sputum production, shortness of breath and wheezing.   Cardiovascular: Negative for chest pain, palpitations, orthopnea and leg swelling.  Gastrointestinal: Negative for abdominal pain, blood in stool, constipation, diarrhea, heartburn, melena, nausea and vomiting.  Genitourinary: Negative for dysuria, frequency and urgency.  Musculoskeletal: Negative for back pain and joint pain.  Skin: Negative.  Negative for itching and rash.  Neurological: Negative for dizziness, tingling, focal weakness, weakness and headaches.  Endo/Heme/Allergies: Does not bruise/bleed easily.  Psychiatric/Behavioral: Negative for depression. The patient has insomnia. The patient is not nervous/anxious.      MEDICAL HISTORY:  Past Medical History:  Diagnosis Date  . Anxiety   . GERD (gastroesophageal reflux disease)   . Hypertension     SURGICAL HISTORY: Past Surgical History:  Procedure Laterality Date  . ABDOMINAL HYSTERECTOMY    . APPENDECTOMY    . CHOLECYSTECTOMY  28years ago  . LYMPH GLAND EXCISION Right 01/22/2020   Procedure: CERVICAL LYMPH GLAND EXCISION;  Surgeon: Herbert Pun, MD;  Location: [facility] ORS;  Service: General;  Laterality: Right;  . PORTACATH PLACEMENT N/A 02/24/2020    Procedure: INSERTION PORT-A-CATH;  Surgeon: Herbert Pun, MD;  Location: [facility] ORS;  Service: General;  Laterality: N/A;  . TONSILLECTOMY      SOCIAL HISTORY: Social History   Socioeconomic History  . Marital status: Married    Spouse name: Not on file  . Number of children: Not on file  . Years of education: Not on file  . Highest education level: Not on file  Occupational History  . Not on file  Tobacco Use  . Smoking status: Never Smoker  . Smokeless tobacco: Never Used  Vaping Use  . Vaping Use: Never used  Substance and Sexual Activity  . Alcohol use: Yes    Comment: occassional  . Drug use: Not Currently  . Sexual activity: Not on file  Other Topics Concern  . Not on file  Social History Narrative   Lives in [place]; [place]. Never smoked; wine rarely. Used in work in [employer]. With husband.    Social Determinants of Health   Financial Resource Strain:   . Difficulty of Paying Living Expenses: Not on file  Food Insecurity:   . Worried About Charity fundraiser in the Last Year: Not on file  . Ran Out of Food in the Last Year: Not on file  Transportation Needs:   . Lack of Transportation (Medical): Not on file  . Lack of Transportation (Non-Medical): Not on file  Physical Activity:   . Days of Exercise per Week: Not on file  . Minutes of Exercise per Session: Not on file  Stress:   . Feeling of Stress : Not on file  Social Connections:   . Frequency of Communication with Friends and Family: Not on file  . Frequency of Social Gatherings with Friends and Family: Not on file  . Attends Religious Services: Not on file  . Active Member of Clubs or Organizations: Not on file  . Attends Archivist Meetings: Not on file  . Marital Status: Not on file  Intimate Partner Violence:   . Fear of Current or Ex-Partner: Not on file  . Emotionally Abused: Not on file  . Physically Abused: Not on file  . Sexually Abused: Not on file    FAMILY  HISTORY: Family History  Problem Relation Age of Onset  . Cancer Mother        unknown type    ALLERGIES:  is allergic to penicillins.  MEDICATIONS:  Current Outpatient Medications  Medication Sig Dispense Refill  . acyclovir (ZOVIRAX) 400 MG tablet Take 1 tablet (400 mg total) by mouth 2 (two) times daily. One pill a day [to prevent shingles] 60 tablet 6  . amLODipine (NORVASC) 5 MG tablet Take 1 tablet (5 mg total) by mouth daily. 30 tablet 3  . lidocaine-prilocaine (EMLA) cream Apply 1 application topically once as needed for up to 1 dose. Apply the cream generously to the port site 45-60 mins prior to access. 30 g 0  . ondansetron (ZOFRAN) 4 MG tablet TAKE 1 TABLET BY MOUTH EVERY 8 HOURS AS NEEDED FOR NAUSEA FOR VOMITING 20 tablet 0  . prochlorperazine (COMPAZINE) 10 MG tablet Take 1 tablet (10 mg total) by mouth every 6 (six) hours as needed for nausea or vomiting. 40 tablet 1  . zolpidem (AMBIEN  CR) 6.25 MG CR tablet Take 1 tablet (6.25 mg total) by mouth at bedtime as needed for sleep. 30 tablet 0   No current facility-administered medications for this visit.   Facility-Administered Medications Ordered in Other Visits  Medication Dose Route Frequency Provider Last Rate Last Admin  . heparin lock flush 100 unit/mL  500 Units Intravenous Once Cammie Sickle, MD          .  PHYSICAL EXAMINATION: ECOG PERFORMANCE STATUS: 0 - Asymptomatic  Vitals:   06/14/20 0827 06/14/20 0830  BP: 128/89 119/84  Pulse: 90 92  Resp: 16   Temp: (!) 96.7 F (35.9 C)   SpO2: 98%    Filed Weights   06/14/20 0827  Weight: 181 lb (82.1 kg)    Physical Exam Constitutional:      Comments: Walk independently.  Alone.   HENT:     Head: Normocephalic and atraumatic.     Mouth/Throat:     Pharynx: No oropharyngeal exudate.  Eyes:     Pupils: Pupils are equal, round, and reactive to light.  Neck:     Comments: Neck lymphadenopathy improved. Cardiovascular:     Rate and Rhythm:  Normal rate and regular rhythm.  Pulmonary:     Effort: Pulmonary effort is normal. No respiratory distress.     Breath sounds: Normal breath sounds. No wheezing.  Abdominal:     General: Bowel sounds are normal. There is no distension.     Palpations: Abdomen is soft. There is no mass.     Tenderness: There is no abdominal tenderness. There is no guarding or rebound.  Musculoskeletal:        General: No tenderness. Normal range of motion.     Cervical back: Normal range of motion and neck supple.  Skin:    General: Skin is warm.  Neurological:     Mental Status: She is alert and oriented to person, place, and time.  Psychiatric:        Mood and Affect: Affect normal.      LABORATORY DATA:  I have reviewed the data as listed Lab Results  Component Value Date   WBC 4.1 06/14/2020   HGB 14.1 06/14/2020   HCT 39.4 06/14/2020   MCV 89.1 06/14/2020   PLT 205 06/14/2020   Recent Labs    03/02/20 0814 03/02/20 0814 04/04/20 0910 04/04/20 0910 05/03/20 0806 05/31/20 0824 06/14/20 0806  NA 140   < > 139   < > 139 138 139  K 3.9   < > 3.7   < > 3.5 3.8 3.4*  CL 109   < > 108   < > 108 107 109  CO2 21*   < > 21*   < > 22 20* 22  GLUCOSE 101*   < > 133*   < > 113* 100* 130*  BUN 23   < > 17   < > 15 15 14   CREATININE 0.93   < > 0.97   < > 1.03* 0.85 0.94  CALCIUM 9.1   < > 9.2   < > 9.2 9.2 9.2  GFRNONAA >60   < > >60   < > 58* >60 >60  GFRAA >60  --  >60  --  >60  --   --   PROT 7.3   < > 6.8   < > 6.8 6.9 6.9  ALBUMIN 4.2   < > 4.2   < > 4.1 4.2 4.1  AST 24   < >  30   < > 34 33 33  ALT 23   < > 33   < > 26 28 25   ALKPHOS 89   < > 86   < > 86 89 84  BILITOT 1.0   < > 0.8   < > 1.0 1.1 0.8   < > = values in this interval not displayed.    RADIOGRAPHIC STUDIES: I have personally reviewed the radiological images as listed and agreed with the findings in the report. NM PET Image Restag (PS) Skull Base To Thigh  Result Date: 06/09/2020 CLINICAL DATA:  Subsequent  treatment strategy for lymphoma status post chemotherapy. EXAM: NUCLEAR MEDICINE PET SKULL BASE TO THIGH TECHNIQUE: 9.9 mCi F-18 FDG was injected intravenously. Full-ring PET imaging was performed from the skull base to thigh after the radiotracer. CT data was obtained and used for attenuation correction and anatomic localization. Fasting blood glucose: 88 mg/dl COMPARISON:  01/12/2020 FINDINGS: Mediastinal blood pool activity: SUV max 2.56 Liver activity: SUV max 3.06 NECK: There is been interval resolution of previous FDG avid cervical adenopathy. Incidental CT findings: none CHEST: No hypermetabolic mediastinal or hilar nodes. No suspicious pulmonary nodules on the CT scan. Incidental CT findings: none ABDOMEN/PELVIS: No FDG avid lymph nodes within the abdomen or pelvis. The index mesenteric lymph node measures 0.9 cm and has an SUV max of 2.0, image 139/3. Index left retroperitoneal node measures 0.5 cm and has an SUV max of 2.16, image 158/3. The index pre caval lymph node measures 0.6 cm and has an SUV max of 1.44, image 159/3. Index left common iliac lymph node measures 0.9 cm and has an SUV max of 2.03, image 177/3. Index right external iliac node measures 0.7 cm and has an SUV max of 1.55, image 216/3. Incidental CT findings: Unchanged cystic structure within the right adnexa measuring 2.7 cm, image 222/3. Aortic atherosclerosis and hepatic steatosis. SKELETON: No focal hypermetabolic activity to suggest skeletal metastasis. Incidental CT findings: none IMPRESSION: 1. Interval resolution of previous FDG avid adenopathy within the neck, abdomen, and pelvis. [place] criteria 2. 2. Hepatic steatosis 3.  Aortic Atherosclerosis (ICD10-I70.0). Electronically Signed   By: Kerby Moors M.D.   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  Maximum SUV 11.  Currently on bendamustine Rituxan s/p 3 cycles; OTC 15th 2021- PET scan- CR.   # HOLD  with cycle #4 bendamustine Rituxan today- sec to orthostatic hypotension. Labs today reviewed  # Orthostatic Hypotension- ? Etiology; IVFs today; amlodpiine to 2.5mg /day; patient to call us if symptoms not improved.  Recommend holding/discontinuation of amlodipine.  # Insomnia- STABLE; continue Ambien 6.25 mg.  # DISPOSITION:  # HOLD chemo this week; TODAY- IVFs over 1 hour.  # follow up in 2 weeks- MD;labs- cbc/cmp; LDH; Rituxan-1-Benda; D-2-Benda; D-3 - Dr.B  # I reviewed the blood work- with the patient in detail; also reviewed the imaging independently [as summarized above]; and with the patient in detail.       All questions were answered. The patient knows to call the clinic with any problems, questions or concerns.    Cammie Sickle, MD 06/14/2020 9:02 AM

## 2020-06-14 NOTE — Progress Notes (Signed)
Pt expresses light headedness that has been going on for a week. Did have a headache yesterday but other than that no other sxs.

## 2020-06-14 NOTE — Assessment & Plan Note (Addendum)
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  Maximum SUV 11.  Currently on bendamustine Rituxan s/p 3 cycles; OTC 15th 2021- PET scan- CR.   # HOLD  with cycle #4 bendamustine Rituxan today- sec to orthostatic hypotension. Labs today reviewed  # Orthostatic Hypotension- ? Etiology; IVFs today; amlodpiine to 2.5mg /day; patient to call us if symptoms not improved.  Recommend holding/discontinuation of amlodipine.  # Insomnia- STABLE; continue Ambien 6.25 mg.  # DISPOSITION:  # HOLD chemo this week; TODAY- IVFs over 1 hour.  # follow up in 2 weeks- MD;labs- cbc/cmp; LDH; Rituxan-1-Benda; D-2-Benda; D-3 - Dr.B  # I reviewed the blood work- with the patient in detail; also reviewed the imaging independently [as summarized above]; and with the patient in detail.

## 2020-06-14 NOTE — Addendum Note (Signed)
Addended by: Gloris Ham on: 06/14/2020 09:07 AM   Modules accepted: Orders

## 2020-06-15 ENCOUNTER — Inpatient Hospital Stay: Payer: 59

## 2020-06-21 ENCOUNTER — Other Ambulatory Visit: Payer: Self-pay | Admitting: Internal Medicine

## 2020-06-28 ENCOUNTER — Inpatient Hospital Stay: Payer: 59 | Attending: Internal Medicine

## 2020-06-28 ENCOUNTER — Inpatient Hospital Stay: Payer: 59

## 2020-06-28 ENCOUNTER — Inpatient Hospital Stay (HOSPITAL_BASED_OUTPATIENT_CLINIC_OR_DEPARTMENT_OTHER): Payer: 59 | Admitting: Internal Medicine

## 2020-06-28 ENCOUNTER — Encounter: Payer: Self-pay | Admitting: Internal Medicine

## 2020-06-28 ENCOUNTER — Other Ambulatory Visit: Payer: Self-pay

## 2020-06-28 ENCOUNTER — Other Ambulatory Visit: Payer: Self-pay | Admitting: Internal Medicine

## 2020-06-28 DIAGNOSIS — C8218 Follicular lymphoma grade II, lymph nodes of multiple sites: Secondary | ICD-10-CM

## 2020-06-28 DIAGNOSIS — K219 Gastro-esophageal reflux disease without esophagitis: Secondary | ICD-10-CM | POA: Insufficient documentation

## 2020-06-28 DIAGNOSIS — G4700 Insomnia, unspecified: Secondary | ICD-10-CM | POA: Insufficient documentation

## 2020-06-28 DIAGNOSIS — Z5189 Encounter for other specified aftercare: Secondary | ICD-10-CM | POA: Insufficient documentation

## 2020-06-28 DIAGNOSIS — Z5111 Encounter for antineoplastic chemotherapy: Secondary | ICD-10-CM | POA: Insufficient documentation

## 2020-06-28 DIAGNOSIS — F419 Anxiety disorder, unspecified: Secondary | ICD-10-CM | POA: Insufficient documentation

## 2020-06-28 DIAGNOSIS — Z79899 Other long term (current) drug therapy: Secondary | ICD-10-CM | POA: Diagnosis not present

## 2020-06-28 DIAGNOSIS — I10 Essential (primary) hypertension: Secondary | ICD-10-CM | POA: Insufficient documentation

## 2020-06-28 DIAGNOSIS — D709 Neutropenia, unspecified: Secondary | ICD-10-CM | POA: Diagnosis not present

## 2020-06-28 DIAGNOSIS — I951 Orthostatic hypotension: Secondary | ICD-10-CM | POA: Diagnosis not present

## 2020-06-28 LAB — CBC WITH DIFFERENTIAL/PLATELET
Abs Immature Granulocytes: 0.05 10*3/uL (ref 0.00–0.07)
Basophils Absolute: 0 10*3/uL (ref 0.0–0.1)
Basophils Relative: 1 %
Eosinophils Absolute: 0.2 10*3/uL (ref 0.0–0.5)
Eosinophils Relative: 5 %
HCT: 39.5 % (ref 36.0–46.0)
Hemoglobin: 13.9 g/dL (ref 12.0–15.0)
Immature Granulocytes: 1 %
Lymphocytes Relative: 20 %
Lymphs Abs: 0.7 10*3/uL (ref 0.7–4.0)
MCH: 32 pg (ref 26.0–34.0)
MCHC: 35.2 g/dL (ref 30.0–36.0)
MCV: 90.8 fL (ref 80.0–100.0)
Monocytes Absolute: 0.7 10*3/uL (ref 0.1–1.0)
Monocytes Relative: 21 %
Neutro Abs: 1.9 10*3/uL (ref 1.7–7.7)
Neutrophils Relative %: 52 %
Platelets: 213 10*3/uL (ref 150–400)
RBC: 4.35 MIL/uL (ref 3.87–5.11)
RDW: 13.2 % (ref 11.5–15.5)
WBC: 3.6 10*3/uL — ABNORMAL LOW (ref 4.0–10.5)
nRBC: 0 % (ref 0.0–0.2)

## 2020-06-28 LAB — COMPREHENSIVE METABOLIC PANEL
ALT: 27 U/L (ref 0–44)
AST: 31 U/L (ref 15–41)
Albumin: 4.2 g/dL (ref 3.5–5.0)
Alkaline Phosphatase: 86 U/L (ref 38–126)
Anion gap: 10 (ref 5–15)
BUN: 17 mg/dL (ref 8–23)
CO2: 21 mmol/L — ABNORMAL LOW (ref 22–32)
Calcium: 9.3 mg/dL (ref 8.9–10.3)
Chloride: 109 mmol/L (ref 98–111)
Creatinine, Ser: 1.01 mg/dL — ABNORMAL HIGH (ref 0.44–1.00)
GFR, Estimated: >60 mL/min (ref >60)
Glucose, Bld: 102 mg/dL — ABNORMAL HIGH (ref 70–99)
Potassium: 3.7 mmol/L (ref 3.5–5.1)
Sodium: 140 mmol/L (ref 135–145)
Total Bilirubin: 0.8 mg/dL (ref 0.3–1.2)
Total Protein: 6.9 g/dL (ref 6.5–8.1)

## 2020-06-28 LAB — LACTATE DEHYDROGENASE: LDH: 232 U/L — ABNORMAL HIGH (ref 98–192)

## 2020-06-28 MED ORDER — SODIUM CHLORIDE 0.9 % IV SOLN
10.0000 mg | Freq: Once | INTRAVENOUS | Status: AC
  Administered 2020-06-28: 10 mg via INTRAVENOUS
  Filled 2020-06-28: qty 1

## 2020-06-28 MED ORDER — SODIUM CHLORIDE 0.9 % IV SOLN
Freq: Once | INTRAVENOUS | Status: AC
  Filled 2020-06-28: qty 250

## 2020-06-28 MED ORDER — SODIUM CHLORIDE 0.9% FLUSH
10.0000 mL | Freq: Once | INTRAVENOUS | Status: AC
  Administered 2020-06-28: 10 mL via INTRAVENOUS
  Filled 2020-06-28: qty 10

## 2020-06-28 MED ORDER — SODIUM CHLORIDE 0.9 % IV SOLN
375.0000 mg/m2 | Freq: Once | INTRAVENOUS | Status: AC
  Administered 2020-06-28: 700 mg via INTRAVENOUS
  Filled 2020-06-28: qty 70

## 2020-06-28 MED ORDER — ACETAMINOPHEN 325 MG PO TABS
650.0000 mg | ORAL_TABLET | Freq: Once | ORAL | Status: AC
  Administered 2020-06-28: 650 mg via ORAL

## 2020-06-28 MED ORDER — PALONOSETRON HCL INJECTION 0.25 MG/5ML
0.2500 mg | Freq: Once | INTRAVENOUS | Status: AC
  Administered 2020-06-28: 0.25 mg via INTRAVENOUS

## 2020-06-28 MED ORDER — SODIUM CHLORIDE 0.9 % IV SOLN
90.0000 mg/m2 | Freq: Once | INTRAVENOUS | Status: AC
  Administered 2020-06-28: 175 mg via INTRAVENOUS
  Filled 2020-06-28: qty 7

## 2020-06-28 MED ORDER — DIPHENHYDRAMINE HCL 25 MG PO CAPS
50.0000 mg | ORAL_CAPSULE | Freq: Once | ORAL | Status: AC
  Administered 2020-06-28: 50 mg via ORAL

## 2020-06-28 MED ORDER — HEPARIN SOD (PORK) LOCK FLUSH 100 UNIT/ML IV SOLN
INTRAVENOUS | Status: AC
  Filled 2020-06-28: qty 5

## 2020-06-28 MED ORDER — HEPARIN SOD (PORK) LOCK FLUSH 100 UNIT/ML IV SOLN
500.0000 [IU] | Freq: Once | INTRAVENOUS | Status: AC
  Administered 2020-06-28: 500 [IU] via INTRAVENOUS
  Filled 2020-06-28: qty 5

## 2020-06-28 NOTE — Progress Notes (Signed)
[name] CONSULT NOTE  Patient Care Team: Peggye Form, NP as PCP - General (Family Medicine) Herbert Pun, MD as Consulting Physician (General Surgery)  CHIEF COMPLAINTS/PURPOSE OF CONSULTATION:   Oncology History Overview Note  # MAY 2021- Bilateral neck adenopathy right more than left; highly concerning for lymphoproliferative disorder.  Excisional biopsy right neck- [Dr.Cintron];  A. 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   B. 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   # MAY 2021-CBC/CMP normal /  mildly elevated alkaline phosphatase. [PCP; [facility]].  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[place])   03/02/2020 -  Chemotherapy   The patient had dexamethasone (DECADRON) 4 MG tablet, 8 mg, Oral, Daily, 1 of 1 cycle, Start date: --, End date: -- palonosetron (ALOXI) injection 0.25 mg, 0.25 mg, Intravenous,  Once, 4 of 6 cycles Administration: 0.25 mg (03/02/2020), 0.25 mg (04/04/2020), 0.25 mg (05/03/2020) pegfilgrastim-cbqv (UDENYCA) injection 6 mg, 6 mg, Subcutaneous, Once, 1 of 3 cycles bendamustine (BENDEKA) 175 mg in sodium chloride 0.9 % 50 mL (3.0702 mg/mL) chemo infusion, 90 mg/m2 = 175 mg, Intravenous,  Once, 4 of 6 cycles Administration: 175 mg (03/02/2020), 175 mg (03/03/2020), 175 mg (04/04/2020), 175 mg (04/05/2020), 175 mg (05/03/2020), 175 mg (05/04/2020) riTUXimab-pvvr (RUXIENCE) 700 mg in sodium chloride 0.9 % 250 mL (2.1875 mg/mL) infusion, 375 mg/m2 = 700 mg, Intravenous,  Once, 2 of 2 cycles Administration: 700 mg (03/02/2020)  for chemotherapy  treatment.     HISTORY OF PRESENTING ILLNESS:  Danielle Lewis 64 y.o.  female  follicular lymphoma grade 1-2 currently on bendamustine Rituxan is here for follow-up.  Patient's chemotherapy was held 2 weeks ago because of hypotension/orthostasis.  Patient received IV fluids.  Currently doing well.  No nausea vomiting diarrhea.  No weight loss.  Review of Systems  Constitutional: Positive for diaphoresis and malaise/fatigue. Negative for chills, fever and weight loss.  HENT: Negative for nosebleeds and sore throat.   Eyes: Negative for double vision.  Respiratory: Negative for cough, hemoptysis, sputum production, shortness of breath and wheezing.   Cardiovascular: Negative for chest pain, palpitations, orthopnea and leg swelling.  Gastrointestinal: Negative for abdominal pain, blood in stool, constipation, diarrhea, heartburn, melena, nausea and vomiting.  Genitourinary: Negative for dysuria, frequency and urgency.  Musculoskeletal: Negative for back pain and joint pain.  Skin: Negative.  Negative for itching and rash.  Neurological: Negative for dizziness, tingling, focal weakness, weakness and headaches.  Endo/Heme/Allergies: Does not bruise/bleed easily.  Psychiatric/Behavioral: Negative for depression. The patient has insomnia. The patient is not nervous/anxious.      MEDICAL HISTORY:  Past Medical History:  Diagnosis Date  . Anxiety   . GERD (gastroesophageal reflux disease)   . Hypertension     SURGICAL HISTORY: Past Surgical History:  Procedure Laterality Date  . ABDOMINAL HYSTERECTOMY    . APPENDECTOMY    . CHOLECYSTECTOMY     28years ago  . LYMPH GLAND EXCISION Right 01/22/2020   Procedure: CERVICAL LYMPH GLAND EXCISION;  Surgeon: Herbert Pun, MD;  Location:  [facility] ORS;  Service: General;  Laterality: Right;  . PORTACATH PLACEMENT N/A 02/24/2020   Procedure: INSERTION PORT-A-CATH;  Surgeon: Herbert Pun, MD;  Location: [facility] ORS;  Service: General;   Laterality: N/A;  . TONSILLECTOMY      SOCIAL HISTORY: Social History   Socioeconomic History  . Marital status: Married    Spouse name: Not on file  . Number of children: Not on file  . Years of education: Not on file  . Highest education level: Not on file  Occupational History  . Not on file  Tobacco Use  . Smoking status: Never Smoker  . Smokeless tobacco: Never Used  Vaping Use  . Vaping Use: Never used  Substance and Sexual Activity  . Alcohol use: Yes    Comment: occassional  . Drug use: Not Currently  . Sexual activity: Not on file  Other Topics Concern  . Not on file  Social History Narrative   Lives in [place]; [place]. Never smoked; wine rarely. Used in work in [employer]. With husband.    Social Determinants of Health   Financial Resource Strain:   . Difficulty of Paying Living Expenses: Not on file  Food Insecurity:   . Worried About Charity fundraiser in the Last Year: Not on file  . Ran Out of Food in the Last Year: Not on file  Transportation Needs:   . Lack of Transportation (Medical): Not on file  . Lack of Transportation (Non-Medical): Not on file  Physical Activity:   . Days of Exercise per Week: Not on file  . Minutes of Exercise per Session: Not on file  Stress:   . Feeling of Stress : Not on file  Social Connections:   . Frequency of Communication with Friends and Family: Not on file  . Frequency of Social Gatherings with Friends and Family: Not on file  . Attends Religious Services: Not on file  . Active Member of Clubs or Organizations: Not on file  . Attends Archivist Meetings: Not on file  . Marital Status: Not on file  Intimate Partner Violence:   . Fear of Current or Ex-Partner: Not on file  . Emotionally Abused: Not on file  . Physically Abused: Not on file  . Sexually Abused: Not on file    FAMILY HISTORY: Family History  Problem Relation Age of Onset  . Cancer Mother        unknown type    ALLERGIES:  is  allergic to penicillins.  MEDICATIONS:  Current Outpatient Medications  Medication Sig Dispense Refill  . acyclovir (ZOVIRAX) 400 MG tablet Take 1 tablet (400 mg total) by mouth 2 (two) times daily. One pill a day [to prevent shingles] 60 tablet 6  . ALPRAZolam (XANAX) 0.5 MG tablet Take 0.5 mg by mouth 2 (two) times daily as needed.    Marland Kitchen amLODipine (NORVASC) 5 MG tablet Take 1 tablet (5 mg total) by mouth daily. (Patient taking differently: Take 2.5 mg by mouth daily. ) 30 tablet 3  . escitalopram (LEXAPRO) 10 MG tablet Take 10 mg by mouth daily.    Marland Kitchen lidocaine-prilocaine (EMLA) cream Apply 1 application topically once as needed for up to 1 dose. Apply the cream generously to the port site 45-60 mins prior to access. 30 g 0  . ondansetron (ZOFRAN) 4 MG tablet TAKE 1 TABLET BY MOUTH EVERY 8 HOURS AS NEEDED FOR NAUSEA FOR VOMITING 20 tablet 0  . prochlorperazine (COMPAZINE) 10 MG tablet Take 1 tablet (  10 mg total) by mouth every 6 (six) hours as needed for nausea or vomiting. 40 tablet 1  . zolpidem (AMBIEN CR) 6.25 MG CR tablet Take 1 tablet (6.25 mg total) by mouth at bedtime as needed for sleep. 30 tablet 0   No current facility-administered medications for this visit.   Facility-Administered Medications Ordered in Other Visits  Medication Dose Route Frequency Provider Last Rate Last Admin  . bendamustine (BENDEKA) 175 mg in sodium chloride 0.9 % 50 mL (3.0702 mg/mL) chemo infusion  90 mg/m2 (Treatment Plan Recorded) Intravenous Once Charlaine Dalton R, MD      . heparin lock flush 100 unit/mL  500 Units Intravenous Once Cammie Sickle, MD          .  PHYSICAL EXAMINATION: ECOG PERFORMANCE STATUS: 0 - Asymptomatic  Vitals:   06/28/20 0832  BP: 128/86  Pulse: 77  Resp: 16  Temp: (!) 96.6 F (35.9 C)  SpO2: 100%   Filed Weights   06/28/20 0832  Weight: 182 lb 3.2 oz (82.6 kg)    Physical Exam Constitutional:      Comments: Walk independently.  Alone.   HENT:      Head: Normocephalic and atraumatic.     Mouth/Throat:     Pharynx: No oropharyngeal exudate.  Eyes:     Pupils: Pupils are equal, round, and reactive to light.  Neck:     Comments: Neck lymphadenopathy improved. Cardiovascular:     Rate and Rhythm: Normal rate and regular rhythm.  Pulmonary:     Effort: Pulmonary effort is normal. No respiratory distress.     Breath sounds: Normal breath sounds. No wheezing.  Abdominal:     General: Bowel sounds are normal. There is no distension.     Palpations: Abdomen is soft. There is no mass.     Tenderness: There is no abdominal tenderness. There is no guarding or rebound.  Musculoskeletal:        General: No tenderness. Normal range of motion.     Cervical back: Normal range of motion and neck supple.  Skin:    General: Skin is warm.  Neurological:     Mental Status: She is alert and oriented to person, place, and time.  Psychiatric:        Mood and Affect: Affect normal.      LABORATORY DATA:  I have reviewed the data as listed Lab Results  Component Value Date   WBC 3.6 (L) 06/28/2020   HGB 13.9 06/28/2020   HCT 39.5 06/28/2020   MCV 90.8 06/28/2020   PLT 213 06/28/2020   Recent Labs    03/02/20 0814 03/02/20 0814 04/04/20 0910 04/04/20 0910 05/03/20 0806 05/03/20 0806 05/31/20 0824 06/14/20 0806 06/28/20 0825  NA 140   < > 139   < > 139   < > 138 139 140  K 3.9   < > 3.7   < > 3.5   < > 3.8 3.4* 3.7  CL 109   < > 108   < > 108   < > 107 109 109  CO2 21*   < > 21*   < > 22   < > 20* 22 21*  GLUCOSE 101*   < > 133*   < > 113*   < > 100* 130* 102*  BUN 23   < > 17   < > 15   < > 15 14 17   CREATININE 0.93   < > 0.97   < >  1.03*   < > 0.85 0.94 1.01*  CALCIUM 9.1   < > 9.2   < > 9.2   < > 9.2 9.2 9.3  GFRNONAA >60   < > >60   < > 58*   < > >60 >60 >60  GFRAA >60  --  >60  --  >60  --   --   --   --   PROT 7.3   < > 6.8   < > 6.8   < > 6.9 6.9 6.9  ALBUMIN 4.2   < > 4.2   < > 4.1   < > 4.2 4.1 4.2  AST 24   < > 30    < > 34   < > 33 33 31  ALT 23   < > 33   < > 26   < > 28 25 27   ALKPHOS 89   < > 86   < > 86   < > 89 84 86  BILITOT 1.0   < > 0.8   < > 1.0   < > 1.1 0.8 0.8   < > = values in this interval not displayed.    RADIOGRAPHIC STUDIES: I have personally reviewed the radiological images as listed and agreed with the findings in the report. NM PET Image Restag (PS) Skull Base To Thigh  Result Date: 06/09/2020 CLINICAL DATA:  Subsequent treatment strategy for lymphoma status post chemotherapy. EXAM: NUCLEAR MEDICINE PET SKULL BASE TO THIGH TECHNIQUE: 9.9 mCi F-18 FDG was injected intravenously. Full-ring PET imaging was performed from the skull base to thigh after the radiotracer. CT data was obtained and used for attenuation correction and anatomic localization. Fasting blood glucose: 88 mg/dl COMPARISON:  01/12/2020 FINDINGS: Mediastinal blood pool activity: SUV max 2.56 Liver activity: SUV max 3.06 NECK: There is been interval resolution of previous FDG avid cervical adenopathy. Incidental CT findings: none CHEST: No hypermetabolic mediastinal or hilar nodes. No suspicious pulmonary nodules on the CT scan. Incidental CT findings: none ABDOMEN/PELVIS: No FDG avid lymph nodes within the abdomen or pelvis. The index mesenteric lymph node measures 0.9 cm and has an SUV max of 2.0, image 139/3. Index left retroperitoneal node measures 0.5 cm and has an SUV max of 2.16, image 158/3. The index pre caval lymph node measures 0.6 cm and has an SUV max of 1.44, image 159/3. Index left common iliac lymph node measures 0.9 cm and has an SUV max of 2.03, image 177/3. Index right external iliac node measures 0.7 cm and has an SUV max of 1.55, image 216/3. Incidental CT findings: Unchanged cystic structure within the right adnexa measuring 2.7 cm, image 222/3. Aortic atherosclerosis and hepatic steatosis. SKELETON: No focal hypermetabolic activity to suggest skeletal metastasis. Incidental CT findings: none IMPRESSION: 1.  Interval resolution of previous FDG avid adenopathy within the neck, abdomen, and pelvis. Deauville criteria 2. 2. Hepatic steatosis 3.  Aortic Atherosclerosis (ICD10-I70.0). Electronically Signed   By: Kerby Moors M.D.   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  Maximum SUV 11.  Currently on bendamustine Rituxan s/p 3 cycles; OTC 15th 2021- PET scan- CR.   # proceed with cycle #4 bendamustine Rituxan today. Labs today reviewed;  acceptable for treatment today. Add  growth factor given neutropenia with cycle #3.  # Orthostatic Hypotension-? Etiology- last visit currently resolved. Monitor closely,   # Insomnia- STABLE.  continue Ambien 6.25 mg.  # DISPOSITION:  #Chemo today; d-2;d-3-undeynca # follow up in 4 weeks- MD;labs- cbc/cmp; LDH; Rituxan-1-Benda; D-2-Benda; D-3-udenyca - Dr.B      All questions were answered. The patient knows to call the clinic with any problems, questions or concerns.    Cammie Sickle, MD 06/28/2020 11:43 AM

## 2020-06-28 NOTE — Assessment & Plan Note (Addendum)
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  Maximum SUV 11.  Currently on bendamustine Rituxan s/p 3 cycles; OTC 15th 2021- PET scan- CR.   # proceed with cycle #4 bendamustine Rituxan today. Labs today reviewed;  acceptable for treatment today. Add growth factor given neutropenia with cycle #3.  # Orthostatic Hypotension-? Etiology- last visit currently resolved. Monitor closely,   # Insomnia- STABLE.  continue Ambien 6.25 mg.  # DISPOSITION:  #Chemo today; d-2;d-3-undeynca # follow up in 4 weeks- MD;labs- cbc/cmp; LDH; Rituxan-1-Benda; D-2-Benda; D-3-udenyca - Dr.B

## 2020-06-29 ENCOUNTER — Inpatient Hospital Stay: Payer: 59

## 2020-06-29 VITALS — BP 147/55 | HR 85 | Temp 96.6°F | Resp 20

## 2020-06-29 DIAGNOSIS — C8218 Follicular lymphoma grade II, lymph nodes of multiple sites: Secondary | ICD-10-CM

## 2020-06-29 DIAGNOSIS — Z5111 Encounter for antineoplastic chemotherapy: Secondary | ICD-10-CM | POA: Diagnosis not present

## 2020-06-29 MED ORDER — SODIUM CHLORIDE 0.9 % IV SOLN
Freq: Once | INTRAVENOUS | Status: AC
  Filled 2020-06-29: qty 250

## 2020-06-29 MED ORDER — HEPARIN SOD (PORK) LOCK FLUSH 100 UNIT/ML IV SOLN
500.0000 [IU] | Freq: Once | INTRAVENOUS | Status: AC | PRN
  Administered 2020-06-29: 500 [IU]
  Filled 2020-06-29: qty 5

## 2020-06-29 MED ORDER — HEPARIN SOD (PORK) LOCK FLUSH 100 UNIT/ML IV SOLN
INTRAVENOUS | Status: AC
  Filled 2020-06-29: qty 5

## 2020-06-29 MED ORDER — SODIUM CHLORIDE 0.9 % IV SOLN
90.0000 mg/m2 | Freq: Once | INTRAVENOUS | Status: AC
  Administered 2020-06-29: 175 mg via INTRAVENOUS
  Filled 2020-06-29: qty 7

## 2020-06-29 MED ORDER — SODIUM CHLORIDE 0.9 % IV SOLN
10.0000 mg | Freq: Once | INTRAVENOUS | Status: AC
  Administered 2020-06-29: 10 mg via INTRAVENOUS
  Filled 2020-06-29: qty 10

## 2020-06-29 MED ORDER — SODIUM CHLORIDE 0.9% FLUSH
10.0000 mL | INTRAVENOUS | Status: DC | PRN
  Administered 2020-06-29: 10 mL
  Filled 2020-06-29: qty 10

## 2020-06-29 NOTE — Progress Notes (Signed)
Patient states, "I feel nervous and jerky today." Patient reports she has felt this way in the past post chemotherapy treatment. MD, Dr. Rogue Bussing, notified and aware. Per MD order: proceed with scheduled Bendeka treatment today, no new orders.  1448- Patient tolerated Bendeka treatment well. Patient discharged to home at this time.

## 2020-06-30 ENCOUNTER — Inpatient Hospital Stay: Payer: 59

## 2020-06-30 DIAGNOSIS — Z5111 Encounter for antineoplastic chemotherapy: Secondary | ICD-10-CM | POA: Diagnosis not present

## 2020-06-30 DIAGNOSIS — C8218 Follicular lymphoma grade II, lymph nodes of multiple sites: Secondary | ICD-10-CM

## 2020-06-30 MED ORDER — PEGFILGRASTIM-CBQV 6 MG/0.6ML ~~LOC~~ SOSY
6.0000 mg | PREFILLED_SYRINGE | Freq: Once | SUBCUTANEOUS | Status: AC
  Administered 2020-06-30: 6 mg via SUBCUTANEOUS
  Filled 2020-06-30: qty 0.6

## 2020-07-04 ENCOUNTER — Inpatient Hospital Stay: Payer: 59

## 2020-07-04 NOTE — Progress Notes (Signed)
Nutrition Follow-up:  Patient with follicular lymphoma, followed by Dr. Jacinto Reap.  Patient receiving chemotherapy.    Spoke with patient via phone.  Patient reports that she feels sore and achy from the shot she got last Thursday.  Reports that she has eaten fruit cocktail and potatoes today.  Drinks Boost but not daily. Does not like the taste of them. Received shake recipes that were mailed but has not tried them yet.  Denies vomiting but some nausea.    Medications: reviewed  Labs: reviewed  Anthropometrics:   Weight 182 lb 3.2 oz on 11/2 increased from 180 lb on 10/5 181 lb on 9/7 191 lb prior to starting treatment    NUTRITION DIAGNOSIS: Inadequate oral intake stable with stable weight   INTERVENTION:  Encouraged patient to try shake recipes as does not like boost shakes.   Encourage intake as able to prevent weight loss.  Encouraged taking nausea medications.  Patient to contact patient with further questions/concerns regarding nutrition.  Verbalized that she has contact information   NEXT VISIT: no follow-up.  RD available if patient has questions concerns or changes in nutritional status.  Danielle Lewis, [place], [place] Registered Dietitian [phone] (mobile)

## 2020-07-26 ENCOUNTER — Inpatient Hospital Stay: Payer: 59

## 2020-07-26 ENCOUNTER — Other Ambulatory Visit: Payer: Self-pay

## 2020-07-26 ENCOUNTER — Inpatient Hospital Stay (HOSPITAL_BASED_OUTPATIENT_CLINIC_OR_DEPARTMENT_OTHER): Payer: 59 | Admitting: Internal Medicine

## 2020-07-26 VITALS — BP 126/83 | HR 82 | Temp 97.1°F

## 2020-07-26 DIAGNOSIS — C8218 Follicular lymphoma grade II, lymph nodes of multiple sites: Secondary | ICD-10-CM | POA: Diagnosis not present

## 2020-07-26 DIAGNOSIS — Z5111 Encounter for antineoplastic chemotherapy: Secondary | ICD-10-CM | POA: Diagnosis not present

## 2020-07-26 LAB — CBC WITH DIFFERENTIAL/PLATELET
Abs Immature Granulocytes: 0.01 10*3/uL (ref 0.00–0.07)
Basophils Absolute: 0.1 10*3/uL (ref 0.0–0.1)
Basophils Relative: 1 %
Eosinophils Absolute: 0.2 10*3/uL (ref 0.0–0.5)
Eosinophils Relative: 4 %
HCT: 43.1 % (ref 36.0–46.0)
Hemoglobin: 15.1 g/dL — ABNORMAL HIGH (ref 12.0–15.0)
Immature Granulocytes: 0 %
Lymphocytes Relative: 16 %
Lymphs Abs: 0.9 10*3/uL (ref 0.7–4.0)
MCH: 32.2 pg (ref 26.0–34.0)
MCHC: 35 g/dL (ref 30.0–36.0)
MCV: 91.9 fL (ref 80.0–100.0)
Monocytes Absolute: 0.9 10*3/uL (ref 0.1–1.0)
Monocytes Relative: 15 %
Neutro Abs: 3.6 10*3/uL (ref 1.7–7.7)
Neutrophils Relative %: 64 %
Platelets: 201 10*3/uL (ref 150–400)
RBC: 4.69 MIL/uL (ref 3.87–5.11)
RDW: 13.3 % (ref 11.5–15.5)
WBC: 5.7 10*3/uL (ref 4.0–10.5)
nRBC: 0 % (ref 0.0–0.2)

## 2020-07-26 LAB — COMPREHENSIVE METABOLIC PANEL
ALT: 29 U/L (ref 0–44)
AST: 30 U/L (ref 15–41)
Albumin: 4.3 g/dL (ref 3.5–5.0)
Alkaline Phosphatase: 89 U/L (ref 38–126)
Anion gap: 8 (ref 5–15)
BUN: 16 mg/dL (ref 8–23)
CO2: 23 mmol/L (ref 22–32)
Calcium: 9.4 mg/dL (ref 8.9–10.3)
Chloride: 107 mmol/L (ref 98–111)
Creatinine, Ser: 0.82 mg/dL (ref 0.44–1.00)
GFR, Estimated: >60 mL/min (ref >60)
Glucose, Bld: 103 mg/dL — ABNORMAL HIGH (ref 70–99)
Potassium: 3.9 mmol/L (ref 3.5–5.1)
Sodium: 138 mmol/L (ref 135–145)
Total Bilirubin: 0.7 mg/dL (ref 0.3–1.2)
Total Protein: 7 g/dL (ref 6.5–8.1)

## 2020-07-26 LAB — LACTATE DEHYDROGENASE: LDH: 153 U/L (ref 98–192)

## 2020-07-26 MED ORDER — HEPARIN SOD (PORK) LOCK FLUSH 100 UNIT/ML IV SOLN
500.0000 [IU] | Freq: Once | INTRAVENOUS | Status: DC | PRN
  Filled 2020-07-26: qty 5

## 2020-07-26 MED ORDER — SODIUM CHLORIDE 0.9 % IV SOLN
90.0000 mg/m2 | Freq: Once | INTRAVENOUS | Status: AC
  Administered 2020-07-26: 175 mg via INTRAVENOUS
  Filled 2020-07-26: qty 7

## 2020-07-26 MED ORDER — DIPHENHYDRAMINE HCL 25 MG PO CAPS
50.0000 mg | ORAL_CAPSULE | Freq: Once | ORAL | Status: AC
  Administered 2020-07-26: 50 mg via ORAL
  Filled 2020-07-26: qty 2

## 2020-07-26 MED ORDER — SODIUM CHLORIDE 0.9 % IV SOLN
375.0000 mg/m2 | Freq: Once | INTRAVENOUS | Status: AC
  Administered 2020-07-26: 700 mg via INTRAVENOUS
  Filled 2020-07-26: qty 50

## 2020-07-26 MED ORDER — SODIUM CHLORIDE 0.9 % IV SOLN
10.0000 mg | Freq: Once | INTRAVENOUS | Status: AC
  Administered 2020-07-26: 10 mg via INTRAVENOUS
  Filled 2020-07-26: qty 10

## 2020-07-26 MED ORDER — PALONOSETRON HCL INJECTION 0.25 MG/5ML
0.2500 mg | Freq: Once | INTRAVENOUS | Status: AC
  Administered 2020-07-26: 0.25 mg via INTRAVENOUS
  Filled 2020-07-26: qty 5

## 2020-07-26 MED ORDER — ZOLPIDEM TARTRATE ER 6.25 MG PO TBCR
6.2500 mg | EXTENDED_RELEASE_TABLET | Freq: Every evening | ORAL | 0 refills | Status: DC | PRN
Start: 2020-07-26 — End: 2020-08-22

## 2020-07-26 MED ORDER — ACETAMINOPHEN 325 MG PO TABS
650.0000 mg | ORAL_TABLET | Freq: Once | ORAL | Status: AC
  Administered 2020-07-26: 650 mg via ORAL
  Filled 2020-07-26: qty 2

## 2020-07-26 MED ORDER — HEPARIN SOD (PORK) LOCK FLUSH 100 UNIT/ML IV SOLN
500.0000 [IU] | Freq: Once | INTRAVENOUS | Status: AC
  Administered 2020-07-26: 500 [IU] via INTRAVENOUS
  Filled 2020-07-26: qty 5

## 2020-07-26 MED ORDER — SODIUM CHLORIDE 0.9 % IV SOLN
Freq: Once | INTRAVENOUS | Status: AC
  Filled 2020-07-26: qty 250

## 2020-07-26 MED ORDER — SODIUM CHLORIDE 0.9% FLUSH
10.0000 mL | Freq: Once | INTRAVENOUS | Status: AC
  Administered 2020-07-26: 10 mL via INTRAVENOUS
  Filled 2020-07-26: qty 10

## 2020-07-26 MED ORDER — HEPARIN SOD (PORK) LOCK FLUSH 100 UNIT/ML IV SOLN
INTRAVENOUS | Status: AC
  Filled 2020-07-26: qty 5

## 2020-07-26 NOTE — Assessment & Plan Note (Addendum)
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  Maximum SUV 11.  Currently on bendamustine Rituxan s/p 3 cycles; OCT 15th 2021- PET scan- CR. STABLE.   # proceed with cycle #5 bendamustine Rituxan today. Labs today reviewed;  acceptable for treatment today. Will need PET scan after cycle #6.   # Fatigue/myalgias- ? Sec to growth factor; recommend NSAIDs/claritin continue GSF.   # Insomnia- STABLE.  continue Ambien 6.25 mg.  # Pt unvaccinated to COVID- discussed the risks of COVID infection.   # DISPOSITION:  #Chemo today; d-2;d-3-undeynca # follow up in 4 weeks-X- MD;labs- cbc/cmp; LDH; Rituxan-1-Benda; D-2-Benda; D-3-udenyca - Dr.B

## 2020-07-26 NOTE — Progress Notes (Signed)
[facility] CONSULT NOTE  Patient Care Team: Peggye Form, NP as PCP - General (Family Medicine) Herbert Pun, MD as Consulting Physician (General Surgery)  CHIEF COMPLAINTS/PURPOSE OF CONSULTATION:   Oncology History Overview Note  # MAY 2021- Bilateral neck adenopathy right more than left; highly concerning for lymphoproliferative disorder.  Excisional biopsy right neck- [Dr.Cintron];  A. 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   B. 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   # MAY 2021-CBC/CMP normal /  mildly elevated alkaline phosphatase. [PCP; [facility]].  # [name], 2021- [id number]  # SURVIVORSHIP:   # GENETICS:   DIAGNOSIS: Follicle lymphoma  STAGE: III        ;  GOALS: Control  CURRENT/MOST RECENT THERAPY : Bendamustine Rituxan [C]     Follicular lymphoma grade ii, lymph nodes of multiple sites ([place])  01/29/2020 Initial Diagnosis   Follicular lymphoma grade ii, lymph nodes of multiple sites ([place])   03/02/2020 -  Chemotherapy   The patient had dexamethasone (DECADRON) 4 MG tablet, 8 mg, Oral, Daily, 1 of 1 cycle, Start date: --, End date: -- palonosetron (ALOXI) injection 0.25 mg, 0.25 mg, Intravenous,  Once, 5 of 6 cycles Administration: 0.25 mg (03/02/2020), 0.25 mg (04/04/2020), 0.25 mg (05/03/2020), 0.25 mg (06/28/2020) pegfilgrastim-cbqv (UDENYCA) injection 6 mg, 6 mg, Subcutaneous, Once, 2 of 3 cycles Administration: 6 mg (06/30/2020) bendamustine (BENDEKA) 175 mg in sodium chloride 0.9 % 50 mL (3.0702 mg/mL) chemo infusion, 90 mg/m2 = 175 mg, Intravenous,  Once, 5 of 6 cycles Administration: 175 mg (03/02/2020), 175 mg (03/03/2020), 175 mg (04/04/2020), 175 mg (04/05/2020), 175 mg (05/03/2020), 175 mg (05/04/2020), 175 mg (06/28/2020), 175 mg (06/29/2020) riTUXimab-pvvr (RUXIENCE) 700 mg in sodium chloride 0.9 % 250 mL (2.1875 mg/mL) infusion, 375 mg/m2 =  700 mg, Intravenous,  Once, 2 of 2 cycles Administration: 700 mg (03/02/2020)  for chemotherapy treatment.     HISTORY OF PRESENTING ILLNESS:  Danielle Lewis 64 y.o.  female  follicular lymphoma grade 1-2 currently on bendamustine Rituxan is here for follow-up.  Patient denies any new lumps or bumps.   Denies any dizzy spells.  No falls.  Patient noted to have body aches joint pains/fatigue after taking her growth factor support with the last cycle.  Lasted about 2 to 3 weeks.  Currently resolved.  Patient took Tylenol.  No fevers or chills.  Review of Systems  Constitutional: Positive for diaphoresis and malaise/fatigue. Negative for chills, fever and weight loss.  HENT: Negative for nosebleeds and sore throat.   Eyes: Negative for double vision.  Respiratory: Negative for cough, hemoptysis, sputum production, shortness of breath and wheezing.   Cardiovascular: Negative for chest pain, palpitations, orthopnea and leg swelling.  Gastrointestinal: Negative for abdominal pain, blood in stool, constipation, diarrhea, heartburn, melena, nausea and vomiting.  Genitourinary: Negative for dysuria, frequency and urgency.  Musculoskeletal: Negative for back pain and joint pain.  Skin: Negative.  Negative for itching and rash.  Neurological: Negative for dizziness, tingling, focal weakness, weakness and headaches.  Endo/Heme/Allergies: Does not bruise/bleed easily.  Psychiatric/Behavioral: Negative for depression. The patient has insomnia. The patient is not nervous/anxious.      MEDICAL HISTORY:  Past Medical History:  Diagnosis Date  . Anxiety   . GERD (gastroesophageal reflux disease)   . Hypertension     SURGICAL HISTORY: Past Surgical History:  Procedure Laterality Date  .  ABDOMINAL HYSTERECTOMY    . APPENDECTOMY    . CHOLECYSTECTOMY     28years ago  . LYMPH GLAND EXCISION Right 01/22/2020   Procedure: CERVICAL LYMPH GLAND EXCISION;  Surgeon: Herbert Pun, MD;  Location:  [facility] ORS;  Service: General;  Laterality: Right;  . PORTACATH PLACEMENT N/A 02/24/2020   Procedure: INSERTION PORT-A-CATH;  Surgeon: Herbert Pun, MD;  Location: [facility] ORS;  Service: General;  Laterality: N/A;  . TONSILLECTOMY      SOCIAL HISTORY: Social History   Socioeconomic History  . Marital status: Married    Spouse name: Not on file  . Number of children: Not on file  . Years of education: Not on file  . Highest education level: Not on file  Occupational History  . Not on file  Tobacco Use  . Smoking status: Never Smoker  . Smokeless tobacco: Never Used  Vaping Use  . Vaping Use: Never used  Substance and Sexual Activity  . Alcohol use: Yes    Comment: occassional  . Drug use: Not Currently  . Sexual activity: Not on file  Other Topics Concern  . Not on file  Social History Narrative   Lives in [place]; [place]. Never smoked; wine rarely. Used in work in [employer]. With husband.    Social Determinants of Health   Financial Resource Strain:   . Difficulty of Paying Living Expenses: Not on file  Food Insecurity:   . Worried About Charity fundraiser in the Last Year: Not on file  . Ran Out of Food in the Last Year: Not on file  Transportation Needs:   . Lack of Transportation (Medical): Not on file  . Lack of Transportation (Non-Medical): Not on file  Physical Activity:   . Days of Exercise per Week: Not on file  . Minutes of Exercise per Session: Not on file  Stress:   . Feeling of Stress : Not on file  Social Connections:   . Frequency of Communication with Friends and Family: Not on file  . Frequency of Social Gatherings with Friends and Family: Not on file  . Attends Religious Services: Not on file  . Active Member of Clubs or Organizations: Not on file  . Attends Archivist Meetings: Not on file  . Marital Status: Not on file  Intimate Partner Violence:   . Fear of Current or Ex-Partner: Not on file  . Emotionally Abused: Not on  file  . Physically Abused: Not on file  . Sexually Abused: Not on file    FAMILY HISTORY: Family History  Problem Relation Age of Onset  . Cancer Mother        unknown type    ALLERGIES:  is allergic to penicillins.  MEDICATIONS:  Current Outpatient Medications  Medication Sig Dispense Refill  . acyclovir (ZOVIRAX) 400 MG tablet Take 1 tablet (400 mg total) by mouth 2 (two) times daily. One pill a day [to prevent shingles] 60 tablet 6  . ALPRAZolam (XANAX) 0.5 MG tablet Take 0.5 mg by mouth 2 (two) times daily as needed.    Marland Kitchen amLODipine (NORVASC) 5 MG tablet Take 1 tablet (5 mg total) by mouth daily. (Patient taking differently: Take 2.5 mg by mouth daily. ) 30 tablet 3  . escitalopram (LEXAPRO) 10 MG tablet Take 10 mg by mouth daily.    Marland Kitchen lidocaine-prilocaine (EMLA) cream Apply 1 application topically once as needed for up to 1 dose. Apply the cream generously to the port site 45-60 mins prior  to access. 30 g 0  . ondansetron (ZOFRAN) 4 MG tablet TAKE 1 TABLET BY MOUTH EVERY 8 HOURS AS NEEDED FOR NAUSEA FOR VOMITING 20 tablet 0  . prochlorperazine (COMPAZINE) 10 MG tablet Take 1 tablet (10 mg total) by mouth every 6 (six) hours as needed for nausea or vomiting. 40 tablet 1  . zolpidem (AMBIEN CR) 6.25 MG CR tablet Take 1 tablet (6.25 mg total) by mouth at bedtime as needed. for sleep 30 tablet 0   No current facility-administered medications for this visit.   Facility-Administered Medications Ordered in Other Visits  Medication Dose Route Frequency Provider Last Rate Last Admin  . bendamustine (BENDEKA) 175 mg in sodium chloride 0.9 % 50 mL (3.0702 mg/mL) chemo infusion  90 mg/m2 (Treatment Plan Recorded) Intravenous Once Charlaine Dalton R, MD      . heparin lock flush 100 unit/mL  500 Units Intravenous Once Cammie Sickle, MD          .  PHYSICAL EXAMINATION: ECOG PERFORMANCE STATUS: 0 - Asymptomatic  Vitals:   07/26/20 0838  BP: (!) 149/81  Pulse: 84  Resp:  20  Temp: (!) 96.9 F (36.1 C)  SpO2: 97%   Filed Weights   07/26/20 0838  Weight: 183 lb 12.8 oz (83.4 kg)    Physical Exam Constitutional:      Comments: Walk independently.  Accompanied by family.  HENT:     Head: Normocephalic and atraumatic.     Mouth/Throat:     Pharynx: No oropharyngeal exudate.  Eyes:     Pupils: Pupils are equal, round, and reactive to light.  Neck:     Comments: Neck lymphadenopathy improved. Cardiovascular:     Rate and Rhythm: Normal rate and regular rhythm.  Pulmonary:     Effort: Pulmonary effort is normal. No respiratory distress.     Breath sounds: Normal breath sounds. No wheezing.  Abdominal:     General: Bowel sounds are normal. There is no distension.     Palpations: Abdomen is soft. There is no mass.     Tenderness: There is no abdominal tenderness. There is no guarding or rebound.  Musculoskeletal:        General: No tenderness. Normal range of motion.     Cervical back: Normal range of motion and neck supple.  Skin:    General: Skin is warm.  Neurological:     Mental Status: She is alert and oriented to person, place, and time.  Psychiatric:        Mood and Affect: Affect normal.      LABORATORY DATA:  I have reviewed the data as listed Lab Results  Component Value Date   WBC 5.7 07/26/2020   HGB 15.1 (H) 07/26/2020   HCT 43.1 07/26/2020   MCV 91.9 07/26/2020   PLT 201 07/26/2020   Recent Labs    03/02/20 0814 03/02/20 0814 04/04/20 0910 04/04/20 0910 05/03/20 0806 05/31/20 0824 06/14/20 0806 06/28/20 0825 07/26/20 0811  NA 140   < > 139   < > 139   < > 139 140 138  K 3.9   < > 3.7   < > 3.5   < > 3.4* 3.7 3.9  CL 109   < > 108   < > 108   < > 109 109 107  CO2 21*   < > 21*   < > 22   < > 22 21* 23  GLUCOSE 101*   < > 133*   < >  113*   < > 130* 102* 103*  BUN 23   < > 17   < > 15   < > 14 17 16   CREATININE 0.93   < > 0.97   < > 1.03*   < > 0.94 1.01* 0.82  CALCIUM 9.1   < > 9.2   < > 9.2   < > 9.2 9.3 9.4   GFRNONAA >60   < > >60   < > 58*   < > >60 >60 >60  GFRAA >60  --  >60  --  >60  --   --   --   --   PROT 7.3   < > 6.8   < > 6.8   < > 6.9 6.9 7.0  ALBUMIN 4.2   < > 4.2   < > 4.1   < > 4.1 4.2 4.3  AST 24   < > 30   < > 34   < > 33 31 30  ALT 23   < > 33   < > 26   < > 25 27 29   ALKPHOS 89   < > 86   < > 86   < > 84 86 89  BILITOT 1.0   < > 0.8   < > 1.0   < > 0.8 0.8 0.7   < > = values in this interval not displayed.    RADIOGRAPHIC STUDIES: I have personally reviewed the radiological images as listed and agreed with the findings in the report. No results found.  ASSESSMENT & PLAN:   Follicular lymphoma grade ii, lymph nodes of multiple sites ([place]) # Follicular lymphoma grade 1-2; above and below the diaphragm; at least stage III18th, May 2021-PET scan lymphadenopathy bulky right neck; left neck; abdominal/retroperitoneal mesenteric and pelvic.  Maximum SUV 11.  Currently on bendamustine Rituxan s/p 3 cycles; OCT 15th 2021- PET scan- CR. STABLE.   # proceed with cycle #5 bendamustine Rituxan today. Labs today reviewed;  acceptable for treatment today. Will need PET scan after cycle #6.   # Fatigue/myalgias- ? Sec to growth factor; recommend NSAIDs/claritin continue GSF.   # Insomnia- STABLE.  continue Ambien 6.25 mg.  # Pt unvaccinated to COVID- discussed the risks of COVID infection.   # DISPOSITION:  #Chemo today; d-2;d-3-undeynca # follow up in 4 weeks-X- MD;labs- cbc/cmp; LDH; Rituxan-1-Benda; D-2-Benda; D-3-udenyca - Dr.B   All questions were answered. The patient knows to call the clinic with any problems, questions or concerns.    Cammie Sickle, MD 07/26/2020 11:17 AM

## 2020-07-27 ENCOUNTER — Other Ambulatory Visit: Payer: Self-pay | Admitting: Internal Medicine

## 2020-07-27 ENCOUNTER — Inpatient Hospital Stay: Payer: 59 | Attending: Internal Medicine

## 2020-07-27 VITALS — BP 138/84 | HR 83 | Temp 97.8°F | Resp 20

## 2020-07-27 DIAGNOSIS — Z5111 Encounter for antineoplastic chemotherapy: Secondary | ICD-10-CM | POA: Diagnosis present

## 2020-07-27 DIAGNOSIS — K219 Gastro-esophageal reflux disease without esophagitis: Secondary | ICD-10-CM | POA: Insufficient documentation

## 2020-07-27 DIAGNOSIS — C8228 Follicular lymphoma grade III, unspecified, lymph nodes of multiple sites: Secondary | ICD-10-CM | POA: Insufficient documentation

## 2020-07-27 DIAGNOSIS — R5383 Other fatigue: Secondary | ICD-10-CM | POA: Diagnosis not present

## 2020-07-27 DIAGNOSIS — G4700 Insomnia, unspecified: Secondary | ICD-10-CM | POA: Diagnosis not present

## 2020-07-27 DIAGNOSIS — F419 Anxiety disorder, unspecified: Secondary | ICD-10-CM | POA: Insufficient documentation

## 2020-07-27 DIAGNOSIS — Z5189 Encounter for other specified aftercare: Secondary | ICD-10-CM | POA: Insufficient documentation

## 2020-07-27 DIAGNOSIS — C8218 Follicular lymphoma grade II, lymph nodes of multiple sites: Secondary | ICD-10-CM

## 2020-07-27 MED ORDER — HEPARIN SOD (PORK) LOCK FLUSH 100 UNIT/ML IV SOLN
INTRAVENOUS | Status: AC
  Filled 2020-07-27: qty 5

## 2020-07-27 MED ORDER — SODIUM CHLORIDE 0.9% FLUSH
10.0000 mL | INTRAVENOUS | Status: DC | PRN
  Administered 2020-07-27: 10 mL
  Filled 2020-07-27: qty 10

## 2020-07-27 MED ORDER — SODIUM CHLORIDE 0.9 % IV SOLN
Freq: Once | INTRAVENOUS | Status: AC
  Filled 2020-07-27: qty 250

## 2020-07-27 MED ORDER — SODIUM CHLORIDE 0.9 % IV SOLN
10.0000 mg | Freq: Once | INTRAVENOUS | Status: AC
  Administered 2020-07-27: 10 mg via INTRAVENOUS
  Filled 2020-07-27: qty 10

## 2020-07-27 MED ORDER — HEPARIN SOD (PORK) LOCK FLUSH 100 UNIT/ML IV SOLN
500.0000 [IU] | Freq: Once | INTRAVENOUS | Status: AC | PRN
  Administered 2020-07-27: 500 [IU]
  Filled 2020-07-27: qty 5

## 2020-07-27 MED ORDER — SODIUM CHLORIDE 0.9 % IV SOLN
90.0000 mg/m2 | Freq: Once | INTRAVENOUS | Status: AC
  Administered 2020-07-27: 175 mg via INTRAVENOUS
  Filled 2020-07-27: qty 7

## 2020-07-27 NOTE — Progress Notes (Signed)
Stable at discharge 

## 2020-07-28 ENCOUNTER — Inpatient Hospital Stay: Payer: 59

## 2020-07-28 ENCOUNTER — Other Ambulatory Visit: Payer: Self-pay

## 2020-07-28 DIAGNOSIS — C8218 Follicular lymphoma grade II, lymph nodes of multiple sites: Secondary | ICD-10-CM

## 2020-07-28 DIAGNOSIS — Z5111 Encounter for antineoplastic chemotherapy: Secondary | ICD-10-CM | POA: Diagnosis not present

## 2020-07-28 MED ORDER — PEGFILGRASTIM-CBQV 6 MG/0.6ML ~~LOC~~ SOSY
6.0000 mg | PREFILLED_SYRINGE | Freq: Once | SUBCUTANEOUS | Status: AC
  Administered 2020-07-28: 6 mg via SUBCUTANEOUS
  Filled 2020-07-28: qty 0.6

## 2020-08-22 ENCOUNTER — Encounter: Payer: Self-pay | Admitting: Oncology

## 2020-08-22 ENCOUNTER — Other Ambulatory Visit: Payer: Self-pay

## 2020-08-23 ENCOUNTER — Inpatient Hospital Stay: Payer: 59

## 2020-08-23 ENCOUNTER — Inpatient Hospital Stay (HOSPITAL_BASED_OUTPATIENT_CLINIC_OR_DEPARTMENT_OTHER): Payer: 59 | Admitting: Oncology

## 2020-08-23 VITALS — BP 146/90 | HR 99 | Temp 98.3°F | Resp 18 | Ht 63.0 in | Wt 183.3 lb

## 2020-08-23 VITALS — BP 132/83 | HR 85 | Temp 96.8°F | Resp 16

## 2020-08-23 DIAGNOSIS — Z79899 Other long term (current) drug therapy: Secondary | ICD-10-CM

## 2020-08-23 DIAGNOSIS — C8218 Follicular lymphoma grade II, lymph nodes of multiple sites: Secondary | ICD-10-CM | POA: Diagnosis not present

## 2020-08-23 DIAGNOSIS — Z5181 Encounter for therapeutic drug level monitoring: Secondary | ICD-10-CM

## 2020-08-23 DIAGNOSIS — Z5111 Encounter for antineoplastic chemotherapy: Secondary | ICD-10-CM | POA: Diagnosis not present

## 2020-08-23 LAB — CBC WITH DIFFERENTIAL/PLATELET
Abs Immature Granulocytes: 0.07 10*3/uL (ref 0.00–0.07)
Basophils Absolute: 0 10*3/uL (ref 0.0–0.1)
Basophils Relative: 0 %
Eosinophils Absolute: 0.1 10*3/uL (ref 0.0–0.5)
Eosinophils Relative: 1 %
HCT: 41.4 % (ref 36.0–46.0)
Hemoglobin: 14.4 g/dL (ref 12.0–15.0)
Immature Granulocytes: 1 %
Lymphocytes Relative: 21 %
Lymphs Abs: 1.6 10*3/uL (ref 0.7–4.0)
MCH: 32.3 pg (ref 26.0–34.0)
MCHC: 34.8 g/dL (ref 30.0–36.0)
MCV: 92.8 fL (ref 80.0–100.0)
Monocytes Absolute: 0.6 10*3/uL (ref 0.1–1.0)
Monocytes Relative: 8 %
Neutro Abs: 5.2 10*3/uL (ref 1.7–7.7)
Neutrophils Relative %: 69 %
Platelets: 154 10*3/uL (ref 150–400)
RBC: 4.46 MIL/uL (ref 3.87–5.11)
RDW: 14.6 % (ref 11.5–15.5)
WBC: 7.5 10*3/uL (ref 4.0–10.5)
nRBC: 0 % (ref 0.0–0.2)

## 2020-08-23 LAB — COMPREHENSIVE METABOLIC PANEL
ALT: 36 U/L (ref 0–44)
AST: 34 U/L (ref 15–41)
Albumin: 4.1 g/dL (ref 3.5–5.0)
Alkaline Phosphatase: 105 U/L (ref 38–126)
Anion gap: 12 (ref 5–15)
BUN: 15 mg/dL (ref 8–23)
CO2: 19 mmol/L — ABNORMAL LOW (ref 22–32)
Calcium: 8.8 mg/dL — ABNORMAL LOW (ref 8.9–10.3)
Chloride: 103 mmol/L (ref 98–111)
Creatinine, Ser: 0.92 mg/dL (ref 0.44–1.00)
GFR, Estimated: >60 mL/min (ref >60)
Glucose, Bld: 237 mg/dL — ABNORMAL HIGH (ref 70–99)
Potassium: 3.7 mmol/L (ref 3.5–5.1)
Sodium: 134 mmol/L — ABNORMAL LOW (ref 135–145)
Total Bilirubin: 1.1 mg/dL (ref 0.3–1.2)
Total Protein: 6.7 g/dL (ref 6.5–8.1)

## 2020-08-23 LAB — LACTATE DEHYDROGENASE: LDH: 168 U/L (ref 98–192)

## 2020-08-23 MED ORDER — SODIUM CHLORIDE 0.9 % IV SOLN
Freq: Once | INTRAVENOUS | Status: AC
  Filled 2020-08-23: qty 250

## 2020-08-23 MED ORDER — SODIUM CHLORIDE 0.9 % IV SOLN
10.0000 mg | Freq: Once | INTRAVENOUS | Status: AC
  Administered 2020-08-23: 10:00:00 10 mg via INTRAVENOUS
  Filled 2020-08-23: qty 10

## 2020-08-23 MED ORDER — BENDAMUSTINE HCL CHEMO INJECTION 100 MG/4ML
90.0000 mg/m2 | Freq: Once | INTRAVENOUS | Status: AC
  Administered 2020-08-23: 12:00:00 175 mg via INTRAVENOUS
  Filled 2020-08-23: qty 7

## 2020-08-23 MED ORDER — DIPHENHYDRAMINE HCL 25 MG PO CAPS
50.0000 mg | ORAL_CAPSULE | Freq: Once | ORAL | Status: AC
  Administered 2020-08-23: 09:00:00 50 mg via ORAL
  Filled 2020-08-23: qty 2

## 2020-08-23 MED ORDER — ZOLPIDEM TARTRATE ER 6.25 MG PO TBCR
6.2500 mg | EXTENDED_RELEASE_TABLET | Freq: Every evening | ORAL | 0 refills | Status: DC | PRN
Start: 2020-08-23 — End: 2020-09-20

## 2020-08-23 MED ORDER — SODIUM CHLORIDE 0.9 % IV SOLN
375.0000 mg/m2 | Freq: Once | INTRAVENOUS | Status: AC
  Administered 2020-08-23: 10:00:00 700 mg via INTRAVENOUS
  Filled 2020-08-23: qty 50

## 2020-08-23 MED ORDER — HEPARIN SOD (PORK) LOCK FLUSH 100 UNIT/ML IV SOLN
500.0000 [IU] | Freq: Once | INTRAVENOUS | Status: AC
  Administered 2020-08-23: 13:00:00 500 [IU] via INTRAVENOUS
  Filled 2020-08-23: qty 5

## 2020-08-23 MED ORDER — SODIUM CHLORIDE 0.9% FLUSH
10.0000 mL | INTRAVENOUS | Status: DC | PRN
  Administered 2020-08-23: 08:00:00 10 mL via INTRAVENOUS
  Filled 2020-08-23: qty 10

## 2020-08-23 MED ORDER — ACETAMINOPHEN 325 MG PO TABS
650.0000 mg | ORAL_TABLET | Freq: Once | ORAL | Status: AC
  Administered 2020-08-23: 09:00:00 650 mg via ORAL
  Filled 2020-08-23: qty 2

## 2020-08-23 MED ORDER — PALONOSETRON HCL INJECTION 0.25 MG/5ML
0.2500 mg | Freq: Once | INTRAVENOUS | Status: AC
  Administered 2020-08-23: 10:00:00 0.25 mg via INTRAVENOUS

## 2020-08-23 MED ORDER — HEPARIN SOD (PORK) LOCK FLUSH 100 UNIT/ML IV SOLN
INTRAVENOUS | Status: AC
  Filled 2020-08-23: qty 5

## 2020-08-23 NOTE — Progress Notes (Signed)
Patient tolerated infusion well. Discharged home.  

## 2020-08-23 NOTE — Addendum Note (Signed)
Addended by: Soledad Gerlach on: 08/23/2020 09:17 AM   Modules accepted: Orders

## 2020-08-23 NOTE — Progress Notes (Signed)
Hematology/Oncology Consult note [facility]  [phone] [phone]  Patient Care Team: Alm Bustard, NP as PCP - General (Family Medicine) Carolan Shiver, MD as Consulting Physician (General Surgery)   Name of the patient: Danielle Lewis  [id number]  04-03-1956   Date of visit: 08/23/20  Diagnosis-grade 2 follicular lymphoma  Chief complaint/ Reason for visit-on treatment assessment prior to cycle 6 of bendamustine Rituxan chemotherapy  Heme/Onc history:  Oncology History Overview Note  # MAY 2021- Bilateral neck adenopathy right more than left; highly concerning for lymphoproliferative disorder.  Excisional biopsy right neck- [Dr.Cintron];  A. 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   B. 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   # MAY 2021-CBC/CMP normal /  mildly elevated alkaline phosphatase. [PCP; [facility]].  # Peri Jefferson, 2021- [id number]  # SURVIVORSHIP:   # GENETICS:   DIAGNOSIS: Follicle lymphoma  STAGE: III        ;  GOALS: Control  CURRENT/MOST RECENT THERAPY : Bendamustine Rituxan [C]     Follicular lymphoma grade ii, lymph nodes of multiple sites ([facility])  01/29/2020 Initial Diagnosis   Follicular lymphoma grade ii, lymph nodes of multiple sites ([facility])   03/02/2020 -  Chemotherapy   The patient had dexamethasone (DECADRON) 4 MG tablet, 8 mg, Oral, Daily, 1 of 1 cycle, Start date: --, End date: -- palonosetron (ALOXI) injection 0.25 mg, 0.25 mg, Intravenous,  Once, 5 of 6 cycles Administration: 0.25 mg (03/02/2020), 0.25 mg (04/04/2020), 0.25 mg (05/03/2020), 0.25 mg (07/26/2020), 0.25 mg (06/28/2020) pegfilgrastim-cbqv (UDENYCA) injection 6 mg, 6 mg, Subcutaneous, Once, 2 of 3 cycles Administration: 6 mg (06/30/2020), 6 mg (07/28/2020) bendamustine (BENDEKA) 175 mg in sodium chloride 0.9 % 50 mL (3.0702 mg/mL)  chemo infusion, 90 mg/m2 = 175 mg, Intravenous,  Once, 5 of 6 cycles Administration: 175 mg (03/02/2020), 175 mg (03/03/2020), 175 mg (04/04/2020), 175 mg (04/05/2020), 175 mg (05/03/2020), 175 mg (05/04/2020), 175 mg (07/26/2020), 175 mg (07/27/2020), 175 mg (06/28/2020), 175 mg (06/29/2020) riTUXimab-pvvr (RUXIENCE) 700 mg in sodium chloride 0.9 % 250 mL (2.1875 mg/mL) infusion, 375 mg/m2 = 700 mg, Intravenous,  Once, 2 of 2 cycles Administration: 700 mg (03/02/2020)  for chemotherapy treatment.       Interval history-she has tolerated treatment well without any significant side effects.  She has ongoing chronic fatigue but denies other new complaints at this time  ECOG PS- 1 Pain scale- 0 Opioid associated constipation- no  Review of systems- Review of Systems  Constitutional: Positive for malaise/fatigue. Negative for chills, fever and weight loss.  HENT: Negative for congestion, ear discharge and nosebleeds.   Eyes: Negative for blurred vision.  Respiratory: Negative for cough, hemoptysis, sputum production, shortness of breath and wheezing.   Cardiovascular: Negative for chest pain, palpitations, orthopnea and claudication.  Gastrointestinal: Negative for abdominal pain, blood in stool, constipation, diarrhea, heartburn, melena, nausea and vomiting.  Genitourinary: Negative for dysuria, flank pain, frequency, hematuria and urgency.  Musculoskeletal: Negative for back pain, joint pain and myalgias.  Skin: Negative for rash.  Neurological: Negative for dizziness, tingling, focal weakness, seizures, weakness and headaches.  Endo/Heme/Allergies: Does not bruise/bleed easily.  Psychiatric/Behavioral: Negative for depression and suicidal ideas. The patient does not have insomnia.       Allergies  Allergen Reactions  . Penicillins Rash     Past Medical History:  Diagnosis Date  .  Anxiety   . GERD (gastroesophageal reflux disease)   . Hypertension      Past Surgical History:  Procedure  Laterality Date  . ABDOMINAL HYSTERECTOMY    . APPENDECTOMY    . CHOLECYSTECTOMY     28years ago  . LYMPH GLAND EXCISION Right 01/22/2020   Procedure: CERVICAL LYMPH GLAND EXCISION;  Surgeon: Herbert Pun, MD;  Location: [facility] ORS;  Service: General;  Laterality: Right;  . PORTACATH PLACEMENT N/A 02/24/2020   Procedure: INSERTION PORT-A-CATH;  Surgeon: Herbert Pun, MD;  Location: [facility] ORS;  Service: General;  Laterality: N/A;  . TONSILLECTOMY      Social History   Socioeconomic History  . Marital status: Married    Spouse name: Not on file  . Number of children: Not on file  . Years of education: Not on file  . Highest education level: Not on file  Occupational History  . Not on file  Tobacco Use  . Smoking status: Never Smoker  . Smokeless tobacco: Never Used  Vaping Use  . Vaping Use: Never used  Substance and Sexual Activity  . Alcohol use: Yes    Comment: occassional  . Drug use: Not Currently  . Sexual activity: Not on file  Other Topics Concern  . Not on file  Social History Narrative   Lives in [place]; [place]. Never smoked; wine rarely. Used in work in [employer]. With husband.    Social Determinants of Health   Financial Resource Strain: Not on file  Food Insecurity: Not on file  Transportation Needs: Not on file  Physical Activity: Not on file  Stress: Not on file  Social Connections: Not on file  Intimate Partner Violence: Not on file    Family History  Problem Relation Age of Onset  . Cancer Mother        unknown type     Current Outpatient Medications:  .  acyclovir (ZOVIRAX) 400 MG tablet, Take 1 tablet (400 mg total) by mouth 2 (two) times daily. One pill a day [to prevent shingles], Disp: 60 tablet, Rfl: 6 .  ALPRAZolam (XANAX) 0.5 MG tablet, Take 0.5 mg by mouth 2 (two) times daily as needed., Disp: , Rfl:  .  amLODipine (NORVASC) 5 MG tablet, Take 1 tablet (5 mg total) by mouth daily. (Patient taking differently: Take 2.5  mg by mouth daily.), Disp: 30 tablet, Rfl: 3 .  escitalopram (LEXAPRO) 10 MG tablet, Take 10 mg by mouth daily., Disp: , Rfl:  .  lidocaine-prilocaine (EMLA) cream, Apply 1 application topically once as needed for up to 1 dose. Apply the cream generously to the port site 45-60 mins prior to access., Disp: 30 g, Rfl: 0 .  ondansetron (ZOFRAN) 4 MG tablet, TAKE 1 TABLET BY MOUTH EVERY 8 HOURS AS NEEDED FOR NAUSEA FOR VOMITING, Disp: 20 tablet, Rfl: 0 .  prochlorperazine (COMPAZINE) 10 MG tablet, Take 1 tablet (10 mg total) by mouth every 6 (six) hours as needed for nausea or vomiting., Disp: 40 tablet, Rfl: 1 .  zolpidem (AMBIEN CR) 6.25 MG CR tablet, Take 1 tablet (6.25 mg total) by mouth at bedtime as needed. for sleep, Disp: 30 tablet, Rfl: 0  Physical exam:  Vitals:   08/23/20 0835  BP: (!) 146/90  Pulse: 99  Resp: 18  Temp: 98.3 F (36.8 C)  TempSrc: Tympanic  Weight: 183 lb 4.8 oz (83.1 kg)  Height: 5\' 3"  (1.6 m)   Physical Exam HENT:     Head: Normocephalic and atraumatic.  Eyes:     Extraocular Movements: EOM normal.     Pupils: Pupils are equal, round, and reactive to light.  Cardiovascular:     Rate and Rhythm: Normal rate and regular rhythm.     Heart sounds: Normal heart sounds.  Pulmonary:     Effort: Pulmonary effort is normal.     Breath sounds: Normal breath sounds.  Abdominal:     General: Bowel sounds are normal.     Palpations: Abdomen is soft.  Musculoskeletal:     Cervical back: Normal range of motion.  Skin:    General: Skin is warm and dry.  Neurological:     Mental Status: She is alert and oriented to person, place, and time.      CMP Latest Ref Rng & Units 07/26/2020  Glucose 70 - 99 mg/dL 103(H)  BUN 8 - 23 mg/dL 16  Creatinine 0.44 - 1.00 mg/dL 0.82  Sodium 135 - 145 mmol/L 138  Potassium 3.5 - 5.1 mmol/L 3.9  Chloride 98 - 111 mmol/L 107  CO2 22 - 32 mmol/L 23  Calcium 8.9 - 10.3 mg/dL 9.4  Total Protein 6.5 - 8.1 g/dL 7.0  Total  Bilirubin 0.3 - 1.2 mg/dL 0.7  Alkaline Phos 38 - 126 U/L 89  AST 15 - 41 U/L 30  ALT 0 - 44 U/L 29   CBC Latest Ref Rng & Units 08/23/2020  WBC 4.0 - 10.5 K/uL 7.5  Hemoglobin 12.0 - 15.0 g/dL 14.4  Hematocrit 36.0 - 46.0 % 41.4  Platelets 150 - 400 K/uL 154      Assessment and plan- Patient is a 64 y.o. female with history of grade 2 stage III follicular lymphoma here for on treatment assessment prior to cycle 6 of bendamustine Rituxan chemotherapy  Counts okay to proceed with cycle 6 of Rituxan and bendamustine today And bendamustine tomorrow.  She comes on day 3 for udenyca.  This will be her last cycle.  Plan to get PET CT scan 2 weeks from now followed by follow-up with Dr. Rogue Bussing with CBC with differential CMP and LDH  Insomnia: Ambien refilled today  Fatigue: Likely secondary to ongoing treatment.  This is her last cycle.  Hopefully fatigue should improve over the next few weeks   Visit Diagnosis 1. Encounter for antineoplastic chemotherapy   2. Encounter for monitoring rituximab therapy   3. Follicular lymphoma grade ii, lymph nodes of multiple sites [facility])      Dr. Randa Evens, MD, MPH [facility] [id number] 08/23/2020 7:16 AM

## 2020-08-24 ENCOUNTER — Inpatient Hospital Stay: Payer: 59

## 2020-08-24 VITALS — BP 146/85 | HR 88 | Temp 97.1°F

## 2020-08-24 DIAGNOSIS — Z5111 Encounter for antineoplastic chemotherapy: Secondary | ICD-10-CM | POA: Diagnosis not present

## 2020-08-24 DIAGNOSIS — C8218 Follicular lymphoma grade II, lymph nodes of multiple sites: Secondary | ICD-10-CM

## 2020-08-24 MED ORDER — SODIUM CHLORIDE 0.9 % IV SOLN
Freq: Once | INTRAVENOUS | Status: AC
  Filled 2020-08-24: qty 250

## 2020-08-24 MED ORDER — HEPARIN SOD (PORK) LOCK FLUSH 100 UNIT/ML IV SOLN
500.0000 [IU] | Freq: Once | INTRAVENOUS | Status: AC | PRN
  Administered 2020-08-24: 15:00:00 500 [IU]
  Filled 2020-08-24: qty 5

## 2020-08-24 MED ORDER — SODIUM CHLORIDE 0.9 % IV SOLN
10.0000 mg | Freq: Once | INTRAVENOUS | Status: AC
  Administered 2020-08-24: 14:00:00 10 mg via INTRAVENOUS
  Filled 2020-08-24: qty 10

## 2020-08-24 MED ORDER — SODIUM CHLORIDE 0.9% FLUSH
10.0000 mL | INTRAVENOUS | Status: DC | PRN
  Administered 2020-08-24: 14:00:00 10 mL
  Filled 2020-08-24: qty 10

## 2020-08-24 MED ORDER — SODIUM CHLORIDE 0.9 % IV SOLN
90.0000 mg/m2 | Freq: Once | INTRAVENOUS | Status: AC
  Administered 2020-08-24: 14:00:00 175 mg via INTRAVENOUS
  Filled 2020-08-24: qty 7

## 2020-08-24 MED ORDER — HEPARIN SOD (PORK) LOCK FLUSH 100 UNIT/ML IV SOLN
INTRAVENOUS | Status: AC
  Filled 2020-08-24: qty 5

## 2020-08-24 NOTE — Progress Notes (Signed)
Stable at discharge 

## 2020-08-25 ENCOUNTER — Other Ambulatory Visit: Payer: Self-pay

## 2020-08-25 ENCOUNTER — Inpatient Hospital Stay: Payer: 59

## 2020-08-25 DIAGNOSIS — Z5111 Encounter for antineoplastic chemotherapy: Secondary | ICD-10-CM | POA: Diagnosis not present

## 2020-08-25 DIAGNOSIS — C8218 Follicular lymphoma grade II, lymph nodes of multiple sites: Secondary | ICD-10-CM

## 2020-08-25 MED ORDER — PEGFILGRASTIM-CBQV 6 MG/0.6ML ~~LOC~~ SOSY
6.0000 mg | PREFILLED_SYRINGE | Freq: Once | SUBCUTANEOUS | Status: AC
  Administered 2020-08-25: 15:00:00 6 mg via SUBCUTANEOUS
  Filled 2020-08-25: qty 0.6

## 2020-09-07 ENCOUNTER — Other Ambulatory Visit: Payer: Self-pay

## 2020-09-07 ENCOUNTER — Ambulatory Visit
Admission: RE | Admit: 2020-09-07 | Discharge: 2020-09-07 | Disposition: A | Discharge status: Home / Self Care | Payer: 59 | Source: Ambulatory Visit | Attending: Oncology | Admitting: Oncology

## 2020-09-07 DIAGNOSIS — C8218 Follicular lymphoma grade II, lymph nodes of multiple sites: Secondary | ICD-10-CM

## 2020-09-08 ENCOUNTER — Ambulatory Visit
Admission: RE | Admit: 2020-09-08 | Discharge: 2020-09-08 | Disposition: A | Discharge status: Home / Self Care | Payer: 59 | Source: Ambulatory Visit | Attending: Oncology | Admitting: Oncology

## 2020-09-08 ENCOUNTER — Telehealth: Payer: Self-pay | Admitting: *Deleted

## 2020-09-08 DIAGNOSIS — C8218 Follicular lymphoma grade II, lymph nodes of multiple sites: Secondary | ICD-10-CM | POA: Diagnosis present

## 2020-09-08 LAB — GLUCOSE, CAPILLARY: Glucose-Capillary: 71 mg/dL (ref 70–99)

## 2020-09-08 IMAGING — CT NM PET TUM IMG RESTAG (PS) SKULL BASE T - THIGH
1 of 9 series · 1 of 25 positions shown · non-contrast
Comparison: Prior studies from [facility] and [DATE].

CLINICAL DATA: Subsequent treatment strategy for follicular
lymphoma, grade 2 in this 64-year-old female.

EXAM:
NUCLEAR MEDICINE PET SKULL BASE TO THIGH
TECHNIQUE: 9.8 mCi F-18 FDG was injected intravenously. Full-ring PET imaging
was performed from the skull base to thigh after the radiotracer. CT
data was obtained and used for attenuation correction and anatomic
localization.
Fasting blood glucose: 71 mg/dl

[Series 3: ct wb 5.0 b30f · axial · 5.0mm · 0.98mm/px · 1 of 290 slices shown]
[im 290/290  brain]
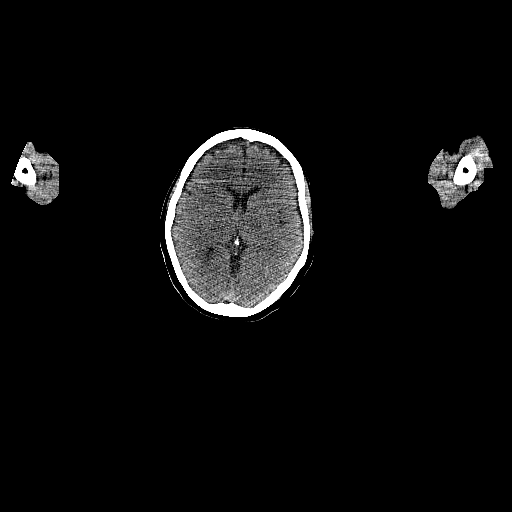

[1 of 25 positions shown; findings below may reference images not displayed]

FINDINGS: Mediastinal blood pool activity: SUV max

Liver activity: SUV max

NECK: No hypermetabolic lymph nodes in the neck.

Incidental CT findings: none

CHEST: No adenopathy in the chest with hypermetabolic features. No
adenopathy by size criteria.

Patchy areas of predominantly ground-glass in the LEFT upper lobe at
the periphery seen on image 89 of series 3 tracking about the
periphery also seen at the LEFT lung base on image 100 of series 3.
Areas with geographic CT appearance, less pronounced in the RIGHT
chest than on the LEFT and not associated with consolidative
characteristics or with pleural effusion.

Increased metabolism in these areas, on image 88 of series 3 maximum

On image 103 of series 3 maximum SUV of

Incidental CT findings: LEFT-sided Port-A-Cath in situ. Normal
caliber thoracic aorta. No pericardial effusion. Heart size top
normal. Normal caliber central pulmonary vasculature. Limited
assessment of cardiovascular structures given lack of intravenous
contrast.

ABDOMEN/PELVIS: No abnormal hypermetabolic activity within the
liver, pancreas, adrenal glands, or spleen. No hypermetabolic lymph
nodes in the abdomen or pelvis.

Lymph node in the small bowel mesentery on image 147 of series 3
shows a maximum SUV of 1.1, previously

Hazy small bowel mesenteric changes in the LEFT hemiabdomen and
associated metabolic activity with a maximum SUV of 1.6, previously
2.2

Small LEFT common iliac lymph nodes, less than a cm (image 163,
series 3) maximum SUV of 2.1, previously 2.0.

Incidental CT findings: Marked hepatic steatosis. Post
cholecystectomy. Pancreas, spleen, adrenal glands and kidneys
without acute process. Small RIGHT renal AML, 9 mm unchanged. No
acute gastrointestinal process. Colonic diverticulosis.

SKELETON: Diffuse uptake in the medullary spaces of visualized
spine, pelvis and femora and to a lesser extent the bilateral
humeri. No focal uptake.

Incidental CT findings: none
IMPRESSION: No signs of hypermetabolic lymph nodes in the neck, chest, abdomen
or pelvis. [HOSPITAL] category 2.

Diffuse marrow uptake is likely related to marrow stimulation,
correlate with any ongoing therapy that would be associated with
marrow stimulation. In the absence of marrow stimulation these
findings could be considered suspicious.

Spleen normal size without signs of abnormal uptake.

Interval development of ground-glass changes in the chest with
peripheral and basilar predominance and some asymmetry. Findings are
most suspicious for developing pneumonitis perhaps even [0A]
infection. Suggest short interval follow-up to ensure resolution.

These results will be called to the ordering clinician or
representative by the Radiologist Assistant, and communication
documented in the PACS or [REDACTED].

## 2020-09-08 MED ORDER — FLUDEOXYGLUCOSE F - 18 (FDG) INJECTION
9.5000 | Freq: Once | INTRAVENOUS | Status: AC | PRN
  Administered 2020-09-08: 9.88 via INTRAVENOUS

## 2020-09-08 NOTE — Telephone Encounter (Signed)
Called report  IMPRESSION: No signs of hypermetabolic lymph nodes in the neck, chest, abdomen or pelvis. Deauville category 2.  Diffuse marrow uptake is likely related to marrow stimulation, correlate with any ongoing therapy that would be associated with marrow stimulation. In the absence of marrow stimulation these findings could be considered suspicious.  Spleen normal size without signs of abnormal uptake.  Interval development of ground-glass changes in the chest with peripheral and basilar predominance and some asymmetry. Findings are most suspicious for developing pneumonitis perhaps even COVID-19 infection. Suggest short interval follow-up to ensure resolution.  These results will be called to the ordering clinician or representative by the Radiologist Assistant, and communication documented in the PACS or [employer].   Electronically Signed   By: Zetta Bills M.D.   On: 09/08/2020 15:00

## 2020-09-08 NOTE — Telephone Encounter (Signed)
I called patient and left a vm-requesting call back from patient.  Dr. Rogue Bussing requests that patient have a covid test asap.  I will also send her a mychart message.

## 2020-09-08 NOTE — Telephone Encounter (Signed)
Unable to reach patient. Left vm for patient.  Spoke with patient's daughter. She will arrange for patient to obtain covid tesing. Pet scan Results reviewed with daughter.  Daughter aware that if patient is positive for covid, the apt for 1/18 will need to be a virtual visit.

## 2020-09-09 ENCOUNTER — Encounter: Payer: Self-pay | Admitting: Internal Medicine

## 2020-09-09 ENCOUNTER — Telehealth: Payer: Self-pay | Admitting: Nurse Practitioner

## 2020-09-09 NOTE — Telephone Encounter (Signed)
Spoke to patient and daughter. Onset of symptoms was 12/31 or 1/1. Generally symptoms have resolved but patient has ongoing, persistent fatigue. Discussed findings on PET scan concerning for pneumonia. Patient not eligible for outpatient covid treatments such as mAB or antivirals due to onset of symptoms > 7 days. Recommended monitoring of sp02 levels at home. Supportive care reviewed. Isolation guidelines reviewed. ER/UC precautions given. Recommend 1/18 visit be virtual visit. Patient agreeable. Will update Dr. Aletha Halim team.

## 2020-09-12 ENCOUNTER — Encounter: Payer: Self-pay | Admitting: Internal Medicine

## 2020-09-12 ENCOUNTER — Other Ambulatory Visit: Payer: Self-pay

## 2020-09-12 ENCOUNTER — Inpatient Hospital Stay: Payer: 59 | Attending: Internal Medicine | Admitting: Internal Medicine

## 2020-09-12 DIAGNOSIS — C8218 Follicular lymphoma grade II, lymph nodes of multiple sites: Secondary | ICD-10-CM

## 2020-09-12 NOTE — Progress Notes (Signed)
Patient opted to have her virtual apt with Dr. Rogue Bussing today instead of tomorrow. She was recently dx with Covid. Patient reports extreme fatigue. C/o runny nose and shortness of breath.

## 2020-09-12 NOTE — Progress Notes (Signed)
I connected with [name] on 09/12/20 at  3:15 PM EST by video enabled telemedicine visit and verified that I am speaking with the correct person using two identifiers.  I discussed the limitations, risks, security and privacy concerns of performing an evaluation and management service by telemedicine and the availability of in-person appointments. I also discussed with the patient that there may be a patient responsible charge related to this service. The patient expressed understanding and agreed to proceed.    Other persons participating in the visit and their role in the encounter: RN/medical reconciliation Patient's location: home Provider's location: office  Oncology History Overview Note  # MAY 2021- Bilateral neck adenopathy right more than left; highly concerning for lymphoproliferative disorder.  Excisional biopsy right neck- [Dr.Cintron];  A. 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   B. 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   # MAY 2021-CBC/CMP normal /  mildly elevated alkaline phosphatase. [PCP; [facility]].  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[place])   03/02/2020 -  Chemotherapy   The patient had dexamethasone (DECADRON) 4 MG tablet, 8 mg, Oral, Daily, 1 of 1 cycle, Start date: --, End date: -- palonosetron (ALOXI) injection 0.25 mg, 0.25 mg, Intravenous,  Once, 6 of 6 cycles Administration: 0.25 mg (03/02/2020), 0.25 mg (04/04/2020), 0.25 mg (05/03/2020), 0.25 mg (07/26/2020), 0.25 mg (06/28/2020), 0.25 mg (08/23/2020) pegfilgrastim-cbqv (UDENYCA) injection 6 mg, 6  mg, Subcutaneous, Once, 3 of 3 cycles Administration: 6 mg (06/30/2020), 6 mg (07/28/2020), 6 mg (08/25/2020) bendamustine (BENDEKA) 175 mg in sodium chloride 0.9 % 50 mL (3.0702 mg/mL) chemo infusion, 90 mg/m2 = 175 mg, Intravenous,  Once, 6 of 6 cycles Administration: 175 mg (03/02/2020), 175 mg (03/03/2020), 175 mg (04/04/2020), 175 mg (04/05/2020), 175 mg (05/03/2020), 175 mg (05/04/2020), 175 mg (07/26/2020), 175 mg (07/27/2020), 175 mg (06/28/2020), 175 mg (06/29/2020), 175 mg (08/23/2020), 175 mg (08/24/2020) riTUXimab-pvvr (RUXIENCE) 700 mg in sodium chloride 0.9 % 250 mL (2.1875 mg/mL) infusion, 375 mg/m2 = 700 mg, Intravenous,  Once, 2 of 2 cycles Administration: 700 mg (03/02/2020)  for chemotherapy treatment.      Chief Complaint: LYMPHOMA  History of present illness:Danielle Lewis 65 y.o.  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  In the interim based upon results of PET scan-given incidental finding suggestive of COVID-pneumonia patient had COVID testing.  She is positive for COVID.  Her husband is also positive for COVID.  Unfortunately patient is unvaccinated in spite of my multiple previous recommendation to get vaccinated.  On further questioning patient has had symptoms for the last 3 weeks-cough/ Fever.  Currently no fevers.  Mild cough.  Complains of ongoing fatigue.  Her oxygen levels are in the between 95 to 97% even with ambulation.  Heart rate is mid 80s.  Denies any headaches but denies any nausea vomiting.   Observation/objective:No acute distress; ambulating well.  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  Recommend imaging in 6 months; will order at next visit.  #Incidental-bilateral groundglass opacities left more than right; COVID-positive [symptoms> 3 weeks ago]-pulse ox ambulation 96; heart rate 85.  Patient has mild cough; continues to have fatigue; no fevers. Earlier in the week patient was also evaluated for monoclonal antibody infusion therapy; however not a candidate as her symptoms had been lasting more than 3 weeks.  Clinically I suspect patient is on the road improvement/resolution.  Discussed with patient that since she is unvaccinated-it is likely that she is taking longer for symptoms to improve.  It should consider fortunate that she did not get any severe symptoms needing hospitalization-given her immunocompromise status/status post recent chemotherapy.  Recommend she gets the vaccination when the symptoms resolved.  Again counseled the patient to call us if her symptoms do not get any better or if started get worse.  # DISPOSITION:  # follow up in 3 months;MD; labs- cbc/cmp/port flush-- Dr.B    Follow-up instructions:  I discussed the assessment and treatment plan with the patient.  The patient was provided an opportunity to ask questions and all were answered.  The patient agreed with the plan and demonstrated understanding of instructions.  The patient was advised to call back or seek an in person evaluation if the symptoms worsen or if the condition fails to improve as anticipated.  Dr. Charlaine Dalton Gillespie at [facility] 09/12/2020 3:45 PM

## 2020-09-12 NOTE — Assessment & Plan Note (Addendum)
#   Follicular lymphoma grade 1-2; above and below the diaphragm; at least stage III; s/p 6 cycles of Bendamustine Rituxan JAN 2022-No signs of hypermetabolic lymph nodes in the neck, chest, abdomen or pelvis; Diffuse marrow uptake is likely related to marrow stimulation [clinically less likely malignancy]; incidental finding suggestive of COVID pneumonitis [see below].  Recommend imaging in 6 months; will order at next visit.  #Incidental-bilateral groundglass opacities left more than right; COVID-positive [symptoms> 3 weeks ago]-pulse ox ambulation 96; heart rate 85.  Patient has mild cough; continues to have fatigue; no fevers. Earlier in the week patient was also evaluated for monoclonal antibody infusion therapy; however not a candidate as her symptoms had been lasting more than 3 weeks.  Clinically I suspect patient is on the road improvement/resolution.  Discussed with patient that since she is unvaccinated-it is likely that she is taking longer for symptoms to improve.  It should consider fortunate that she did not get any severe symptoms needing hospitalization-given her immunocompromise status/status post recent chemotherapy.  Recommend she gets the vaccination when the symptoms resolved.  Again counseled the patient to call us if her symptoms do not get any better or if started get worse.  # DISPOSITION:  # follow up in 3 months;MD; labs- cbc/cmp/port flush-- Dr.B

## 2020-09-13 ENCOUNTER — Inpatient Hospital Stay: Payer: 59 | Admitting: Internal Medicine

## 2020-09-13 ENCOUNTER — Inpatient Hospital Stay: Payer: 59

## 2020-09-20 ENCOUNTER — Other Ambulatory Visit: Payer: Self-pay | Admitting: *Deleted

## 2020-09-20 MED ORDER — ZOLPIDEM TARTRATE ER 6.25 MG PO TBCR: 6.2500 mg | EXTENDED_RELEASE_TABLET | Freq: Every evening | ORAL | 3 refills | Status: DC | PRN

## 2020-09-20 MED ORDER — ACYCLOVIR 400 MG PO TABS: 400.0000 mg | ORAL_TABLET | Freq: Two times a day (BID) | ORAL | 6 refills | Status: DC

## 2020-09-20 NOTE — Telephone Encounter (Signed)
Patient requesting RF on ambien and acylovir

## 2020-09-21 ENCOUNTER — Telehealth: Payer: Self-pay | Admitting: *Deleted

## 2020-09-21 MED ORDER — ACYCLOVIR 400 MG PO TABS: 400.0000 mg | ORAL_TABLET | Freq: Every day | ORAL | 1 refills | Status: DC

## 2020-09-21 NOTE — Telephone Encounter (Signed)
Spoke with Dr. Rogue Bussing. V/o to resubmit script with 1 tablet daily of acylovir

## 2020-09-21 NOTE — Telephone Encounter (Signed)
Pharmacy called wanting clarification of directions for Acyclovir as there are 2 sets of instructions on the prescription. Does 2 once a day or 1 daily. Please resubmit prescription to them with correct directions

## 2020-10-18 ENCOUNTER — Inpatient Hospital Stay: Payer: 59 | Attending: Internal Medicine

## 2020-10-18 DIAGNOSIS — C8218 Follicular lymphoma grade II, lymph nodes of multiple sites: Secondary | ICD-10-CM | POA: Insufficient documentation

## 2020-10-18 DIAGNOSIS — Z95828 Presence of other vascular implants and grafts: Secondary | ICD-10-CM

## 2020-10-18 DIAGNOSIS — Z452 Encounter for adjustment and management of vascular access device: Secondary | ICD-10-CM | POA: Diagnosis not present

## 2020-10-18 MED ORDER — SODIUM CHLORIDE 0.9% FLUSH
10.0000 mL | INTRAVENOUS | Status: DC | PRN
  Administered 2020-10-18: 10 mL via INTRAVENOUS
  Filled 2020-10-18: qty 10

## 2020-10-18 MED ORDER — HEPARIN SOD (PORK) LOCK FLUSH 100 UNIT/ML IV SOLN
INTRAVENOUS | Status: AC
  Filled 2020-10-18: qty 5

## 2020-10-18 MED ORDER — HEPARIN SOD (PORK) LOCK FLUSH 100 UNIT/ML IV SOLN
500.0000 [IU] | Freq: Once | INTRAVENOUS | Status: AC
  Administered 2020-10-18: 500 [IU] via INTRAVENOUS
  Filled 2020-10-18: qty 5

## 2020-12-12 ENCOUNTER — Inpatient Hospital Stay (HOSPITAL_BASED_OUTPATIENT_CLINIC_OR_DEPARTMENT_OTHER): Payer: 59 | Admitting: Internal Medicine

## 2020-12-12 ENCOUNTER — Encounter: Payer: Self-pay | Admitting: Internal Medicine

## 2020-12-12 ENCOUNTER — Other Ambulatory Visit: Payer: Self-pay

## 2020-12-12 ENCOUNTER — Inpatient Hospital Stay: Payer: 59 | Attending: Internal Medicine

## 2020-12-12 DIAGNOSIS — Z79899 Other long term (current) drug therapy: Secondary | ICD-10-CM

## 2020-12-12 DIAGNOSIS — K219 Gastro-esophageal reflux disease without esophagitis: Secondary | ICD-10-CM | POA: Diagnosis not present

## 2020-12-12 DIAGNOSIS — M1990 Unspecified osteoarthritis, unspecified site: Secondary | ICD-10-CM | POA: Insufficient documentation

## 2020-12-12 DIAGNOSIS — R61 Generalized hyperhidrosis: Secondary | ICD-10-CM | POA: Insufficient documentation

## 2020-12-12 DIAGNOSIS — C8218 Follicular lymphoma grade II, lymph nodes of multiple sites: Secondary | ICD-10-CM

## 2020-12-12 DIAGNOSIS — R748 Abnormal levels of other serum enzymes: Secondary | ICD-10-CM | POA: Diagnosis not present

## 2020-12-12 DIAGNOSIS — Z5111 Encounter for antineoplastic chemotherapy: Secondary | ICD-10-CM

## 2020-12-12 DIAGNOSIS — Z9221 Personal history of antineoplastic chemotherapy: Secondary | ICD-10-CM | POA: Diagnosis not present

## 2020-12-12 DIAGNOSIS — R232 Flushing: Secondary | ICD-10-CM | POA: Diagnosis not present

## 2020-12-12 DIAGNOSIS — Z8616 Personal history of COVID-19: Secondary | ICD-10-CM | POA: Insufficient documentation

## 2020-12-12 DIAGNOSIS — I10 Essential (primary) hypertension: Secondary | ICD-10-CM | POA: Insufficient documentation

## 2020-12-12 DIAGNOSIS — Z79620 Long term (current) use of immunosuppressive biologic: Secondary | ICD-10-CM

## 2020-12-12 DIAGNOSIS — Z5181 Encounter for therapeutic drug level monitoring: Secondary | ICD-10-CM

## 2020-12-12 LAB — COMPREHENSIVE METABOLIC PANEL
ALT: 26 U/L (ref 0–44)
AST: 34 U/L (ref 15–41)
Albumin: 4.2 g/dL (ref 3.5–5.0)
Alkaline Phosphatase: 89 U/L (ref 38–126)
Anion gap: 10 (ref 5–15)
BUN: 17 mg/dL (ref 8–23)
CO2: 22 mmol/L (ref 22–32)
Calcium: 9.4 mg/dL (ref 8.9–10.3)
Chloride: 107 mmol/L (ref 98–111)
Creatinine, Ser: 0.85 mg/dL (ref 0.44–1.00)
GFR, Estimated: >60 mL/min (ref >60)
Glucose, Bld: 119 mg/dL — ABNORMAL HIGH (ref 70–99)
Potassium: 3.3 mmol/L — ABNORMAL LOW (ref 3.5–5.1)
Sodium: 139 mmol/L (ref 135–145)
Total Bilirubin: 0.9 mg/dL (ref 0.3–1.2)
Total Protein: 6.5 g/dL (ref 6.5–8.1)

## 2020-12-12 LAB — CBC WITH DIFFERENTIAL/PLATELET
Abs Immature Granulocytes: 0.2 10*3/uL — ABNORMAL HIGH (ref 0.00–0.07)
Basophils Absolute: 0 10*3/uL (ref 0.0–0.1)
Basophils Relative: 1 %
Eosinophils Absolute: 0.2 10*3/uL (ref 0.0–0.5)
Eosinophils Relative: 5 %
HCT: 39.4 % (ref 36.0–46.0)
Hemoglobin: 14.1 g/dL (ref 12.0–15.0)
Immature Granulocytes: 6 %
Lymphocytes Relative: 22 %
Lymphs Abs: 0.8 10*3/uL (ref 0.7–4.0)
MCH: 31.8 pg (ref 26.0–34.0)
MCHC: 35.8 g/dL (ref 30.0–36.0)
MCV: 88.7 fL (ref 80.0–100.0)
Monocytes Absolute: 0.5 10*3/uL (ref 0.1–1.0)
Monocytes Relative: 15 %
Neutro Abs: 1.7 10*3/uL (ref 1.7–7.7)
Neutrophils Relative %: 51 %
Platelets: 193 10*3/uL (ref 150–400)
RBC: 4.44 MIL/uL (ref 3.87–5.11)
RDW: 12.7 % (ref 11.5–15.5)
WBC: 3.4 10*3/uL — ABNORMAL LOW (ref 4.0–10.5)
nRBC: 0 % (ref 0.0–0.2)

## 2020-12-12 LAB — LACTATE DEHYDROGENASE: LDH: 249 U/L — ABNORMAL HIGH (ref 98–192)

## 2020-12-12 MED ORDER — SODIUM CHLORIDE 0.9% FLUSH
10.0000 mL | Freq: Once | INTRAVENOUS | Status: AC
Start: 2020-12-12 — End: 2020-12-12
  Administered 2020-12-12: 10 mL
  Filled 2020-12-12: qty 10

## 2020-12-12 MED ORDER — HEPARIN SOD (PORK) LOCK FLUSH 100 UNIT/ML IV SOLN
INTRAVENOUS | Status: AC
  Filled 2020-12-12: qty 5

## 2020-12-12 MED ORDER — HEPARIN SOD (PORK) LOCK FLUSH 100 UNIT/ML IV SOLN
500.0000 [IU] | Freq: Once | INTRAVENOUS | Status: AC
  Administered 2020-12-12: 500 [IU]
  Filled 2020-12-12: qty 5

## 2020-12-12 MED ORDER — LIDOCAINE-PRILOCAINE 2.5-2.5 % EX CREA: 1.0000 "application " | TOPICAL_CREAM | Freq: Once | CUTANEOUS | 0 refills | Status: DC | PRN

## 2020-12-12 NOTE — Assessment & Plan Note (Addendum)
#   Follicular lymphoma grade 1-2; at least stage III; s/p 6 cycles of Bendamustine Rituxan PET JAN 2022-No signs of hypermetabolic lymph nodes in the neck, chest, abdomen or pelvis.  Clinically stable-no clinical evidence of progression.  We will recommend a CT scan neck chest abdomen pelvis in 3 months from now.  # Joint pains- ? OA vs RA-defer to PCP regarding referral to rheumatology.Marland Kitchen   #Insomnia/hot flashes/night sweats-patient on Ambien/Lexapro.-Not improved; unrelated to history of follicle lymphoma.  Defer to PCP for further evaluation/recommendations.  # COVID [jan 8110]- unvvaccinated.   # Shingles prophylaxis:  STOP acclovir.   # DISPOSITION:  # follow up in 3 months;MD; labs- cbc/cmp/port flush; CT prior-- Dr.B

## 2020-12-12 NOTE — Progress Notes (Signed)
[facility] CONSULT NOTE  Patient Care Team: Peggye Form, NP as PCP - General (Family Medicine) Herbert Pun, MD as Consulting Physician (General Surgery) Cammie Sickle, MD as Consulting Physician (Internal Medicine) Herbert Pun, MD as Consulting Physician (General Surgery)  CHIEF COMPLAINTS/PURPOSE OF CONSULTATION:   Oncology History Overview Note  # MAY 2021- Bilateral neck adenopathy right more than left; highly concerning for lymphoproliferative disorder.  Excisional biopsy right neck- [Dr.Cintron];  A. 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   B. 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   # MAY 2021-CBC/CMP normal /  mildly elevated alkaline phosphatase. [PCP; [facility]].  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[place])   03/02/2020 -  Chemotherapy   The patient had dexamethasone (DECADRON) 4 MG tablet, 8 mg, Oral, Daily, 1 of 1 cycle, Start date: --, End date: -- palonosetron (ALOXI) injection 0.25 mg, 0.25 mg, Intravenous,  Once, 6 of 6 cycles Administration: 0.25 mg (03/02/2020), 0.25 mg (04/04/2020), 0.25 mg (05/03/2020), 0.25 mg (07/26/2020), 0.25 mg (06/28/2020), 0.25 mg (08/23/2020) pegfilgrastim-cbqv (UDENYCA) injection 6 mg, 6 mg, Subcutaneous, Once, 3 of 3 cycles Administration: 6 mg (06/30/2020), 6 mg (07/28/2020), 6 mg (08/25/2020) bendamustine (BENDEKA) 175 mg in sodium chloride 0.9 % 50 mL (3.0702 mg/mL) chemo infusion, 90 mg/m2 = 175 mg, Intravenous,  Once, 6 of 6 cycles Administration: 175 mg (03/02/2020), 175 mg  (03/03/2020), 175 mg (04/04/2020), 175 mg (04/05/2020), 175 mg (05/03/2020), 175 mg (05/04/2020), 175 mg (07/26/2020), 175 mg (07/27/2020), 175 mg (06/28/2020), 175 mg (06/29/2020), 175 mg (08/23/2020), 175 mg (08/24/2020) riTUXimab-pvvr (RUXIENCE) 700 mg in sodium chloride 0.9 % 250 mL (2.1875 mg/mL) infusion, 375 mg/m2 = 700 mg, Intravenous,  Once, 2 of 2 cycles Administration: 700 mg (03/02/2020)  for chemotherapy treatment.     HISTORY OF PRESENTING ILLNESS:  Danielle Lewis 65 y.o.  female  follicular lymphoma grade 1-2 currently on bendamustine Rituxan is here for follow-up.  Patient recovered fairly well from her COVID pneumonia diagnosed December/January 2022.  Patient continues to be unvaccinated to [place].  Patient denies any new lumps or bumps.  She continues to currently have joint aches/especially small joints.  Continues to have insomnia.  Continues to complain of night sweats/hot flashes.  Review of Systems  Constitutional: Positive for diaphoresis and malaise/fatigue. Negative for chills, fever and weight loss.  HENT: Negative for nosebleeds and sore throat.   Eyes: Negative for double vision.  Respiratory: Negative for cough, hemoptysis, sputum production, shortness of breath and wheezing.   Cardiovascular: Negative for chest pain, palpitations, orthopnea and leg swelling.  Gastrointestinal: Negative for abdominal pain, blood in stool, constipation, diarrhea, heartburn, melena, nausea and vomiting.  Genitourinary: Negative for dysuria, frequency and urgency.  Musculoskeletal: Negative for back pain and joint pain.  Skin: Negative.  Negative for itching and rash.  Neurological: Negative for dizziness, tingling, focal weakness, weakness and headaches.  Endo/Heme/Allergies: Does not bruise/bleed easily.  Psychiatric/Behavioral: Negative for depression. The patient has insomnia. The patient is not nervous/anxious.      MEDICAL  HISTORY:  Past Medical History:  Diagnosis Date  . Anxiety    . GERD (gastroesophageal reflux disease)   . Hypertension     SURGICAL HISTORY: Past Surgical History:  Procedure Laterality Date  . ABDOMINAL HYSTERECTOMY    . APPENDECTOMY    . CHOLECYSTECTOMY     28years ago  . LYMPH GLAND EXCISION Right 01/22/2020   Procedure: CERVICAL LYMPH GLAND EXCISION;  Surgeon: Herbert Pun, MD;  Location: [facility] ORS;  Service: General;  Laterality: Right;  . PORTACATH PLACEMENT N/A 02/24/2020   Procedure: INSERTION PORT-A-CATH;  Surgeon: Herbert Pun, MD;  Location: [facility] ORS;  Service: General;  Laterality: N/A;  . TONSILLECTOMY      SOCIAL HISTORY: Social History   Socioeconomic History  . Marital status: Married    Spouse name: Not on file  . Number of children: Not on file  . Years of education: Not on file  . Highest education level: Not on file  Occupational History  . Not on file  Tobacco Use  . Smoking status: Never Smoker  . Smokeless tobacco: Never Used  Vaping Use  . Vaping Use: Never used  Substance and Sexual Activity  . Alcohol use: Yes    Comment: occassional  . Drug use: Not Currently  . Sexual activity: Not on file  Other Topics Concern  . Not on file  Social History Narrative   Lives in [place]; [place]. Never smoked; wine rarely. Used in work in [employer]. With husband.    Social Determinants of Health   Financial Resource Strain: Not on file  Food Insecurity: Not on file  Transportation Needs: Not on file  Physical Activity: Not on file  Stress: Not on file  Social Connections: Not on file  Intimate Partner Violence: Not on file    FAMILY HISTORY: Family History  Problem Relation Age of Onset  . Cancer Mother        unknown type    ALLERGIES:  is allergic to penicillins.  MEDICATIONS:  Current Outpatient Medications  Medication Sig Dispense Refill  . ALPRAZolam (XANAX) 0.5 MG tablet Take 0.5 mg by mouth 2 (two) times daily as needed.    Marland Kitchen amLODipine (NORVASC) 5 MG tablet Take 1  tablet (5 mg total) by mouth daily. (Patient taking differently: Take 2.5 mg by mouth daily.) 30 tablet 3  . diclofenac Sodium (VOLTAREN) 1 % GEL Apply 2 g topically 4 (four) times daily.    Marland Kitchen escitalopram (LEXAPRO) 10 MG tablet Take 10 mg by mouth daily.    . ondansetron (ZOFRAN) 4 MG tablet TAKE 1 TABLET BY MOUTH EVERY 8 HOURS AS NEEDED FOR NAUSEA FOR VOMITING 20 tablet 0  . prochlorperazine (COMPAZINE) 10 MG tablet Take 1 tablet (10 mg total) by mouth every 6 (six) hours as needed for nausea or vomiting. 40 tablet 1  . zolpidem (AMBIEN CR) 6.25 MG CR tablet Take 1 tablet (6.25 mg total) by mouth at bedtime as needed. for sleep 30 tablet 3  . lidocaine-prilocaine (EMLA) cream Apply 1 application topically once as needed for up to 1 dose. Apply the cream generously to the port site 45-60 mins prior to access. 30 g 0  . naproxen (NAPROSYN) 500 MG tablet Take 500 mg by mouth 2 (two) times daily as needed.     No current facility-administered medications for this visit.      Marland Kitchen  PHYSICAL EXAMINATION: ECOG PERFORMANCE STATUS: 0 - Asymptomatic  Vitals:   12/12/20 1324  BP: (!) 169/94  Pulse: 81  Resp: 20  Temp: (!) 97.2 F (36.2 C)   Filed Weights   12/12/20 1324  Weight: 184 lb 6.4 oz (83.6 kg)    Physical Exam Constitutional:      Comments: Walk independently.  Accompanied by family.  HENT:     Head: Normocephalic and atraumatic.     Mouth/Throat:     Pharynx: No oropharyngeal exudate.  Eyes:     Pupils: Pupils are equal, round, and reactive to light.  Neck:     Comments: Neck lymphadenopathy improved. Cardiovascular:     Rate and Rhythm: Normal rate and regular rhythm.  Pulmonary:     Effort: Pulmonary effort is normal. No respiratory distress.     Breath sounds: Normal breath sounds. No wheezing.  Abdominal:     General: Bowel sounds are normal. There is no distension.     Palpations: Abdomen is soft. There is no mass.     Tenderness: There is no abdominal tenderness.  There is no guarding or rebound.  Musculoskeletal:        General: No tenderness. Normal range of motion.     Cervical back: Normal range of motion and neck supple.  Skin:    General: Skin is warm.  Neurological:     Mental Status: She is alert and oriented to person, place, and time.  Psychiatric:        Mood and Affect: Affect normal.      LABORATORY DATA:  I have reviewed the data as listed Lab Results  Component Value Date   WBC 3.4 (L) 12/12/2020   HGB 14.1 12/12/2020   HCT 39.4 12/12/2020   MCV 88.7 12/12/2020   PLT 193 12/12/2020   Recent Labs    03/02/20 0814 04/04/20 0910 05/03/20 0806 05/31/20 0824 07/26/20 0811 08/23/20 0816 12/12/20 1330  NA 140 139 139   < > 138 134* 139  K 3.9 3.7 3.5   < > 3.9 3.7 3.3*  CL 109 108 108   < > 107 103 107  CO2 21* 21* 22   < > 23 19* 22  GLUCOSE 101* 133* 113*   < > 103* 237* 119*  BUN 23 17 15    < > 16 15 17   CREATININE 0.93 0.97 1.03*   < > 0.82 0.92 0.85  CALCIUM 9.1 9.2 9.2   < > 9.4 8.8* 9.4  GFRNONAA >60 >60 58*   < > >60 >60 >60  GFRAA >60 >60 >60  --   --   --   --   PROT 7.3 6.8 6.8   < > 7.0 6.7 6.5  ALBUMIN 4.2 4.2 4.1   < > 4.3 4.1 4.2  AST 24 30 34   < > 30 34 34  ALT 23 33 26   < > 29 36 26  ALKPHOS 89 86 86   < > 89 105 89  BILITOT 1.0 0.8 1.0   < > 0.7 1.1 0.9   < > = values in this interval not displayed.    RADIOGRAPHIC STUDIES: I have personally reviewed the radiological images as listed and agreed with the findings in the report. No results found.  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  Clinically stable-no clinical evidence of progression.  We will recommend a CT scan neck chest abdomen pelvis in 3 months from now.  # Joint pains- ? OA vs RA-defer to PCP regarding  referral to rheumatology.Marland Kitchen   #Insomnia/hot flashes/night sweats-patient on Ambien/Lexapro.-Not improved; unrelated to history of follicle lymphoma.  Defer to PCP for further evaluation/recommendations.  # COVID [jan 8209]- unvvaccinated.   # Shingles prophylaxis:  STOP acclovir.   # DISPOSITION:  # follow up in 3 months;MD; labs- cbc/cmp/port flush; CT prior-- Dr.B   All questions were answered. The patient knows to call the clinic with any problems, questions or concerns.    Cammie Sickle, MD 12/12/2020 8:29 PM

## 2020-12-12 NOTE — Progress Notes (Signed)
Survivorship Care Plan visit completed.  Treatment summary reviewed and given to patient.  ASCO answers booklet reviewed and given to patient.  CARE program and Cancer Transitions discussed with patient along with other resources cancer center offers to patients and caregivers.  Patient verbalized understanding.    

## 2021-03-10 ENCOUNTER — Other Ambulatory Visit: Payer: Self-pay | Admitting: Internal Medicine

## 2021-03-13 ENCOUNTER — Encounter: Payer: Self-pay | Admitting: Internal Medicine

## 2021-03-13 ENCOUNTER — Other Ambulatory Visit: Payer: Self-pay | Admitting: Internal Medicine

## 2021-03-14 ENCOUNTER — Encounter: Payer: Self-pay | Admitting: Internal Medicine

## 2021-03-22 ENCOUNTER — Ambulatory Visit
Admission: RE | Admit: 2021-03-22 | Discharge: 2021-03-22 | Disposition: A | Discharge status: Home / Self Care | Payer: 59 | Source: Ambulatory Visit | Attending: Internal Medicine | Admitting: Internal Medicine

## 2021-03-22 ENCOUNTER — Other Ambulatory Visit: Payer: 59

## 2021-03-22 ENCOUNTER — Other Ambulatory Visit: Payer: Self-pay

## 2021-03-22 DIAGNOSIS — C8218 Follicular lymphoma grade II, lymph nodes of multiple sites: Secondary | ICD-10-CM

## 2021-03-22 LAB — POCT I-STAT CREATININE: Creatinine, Ser: 1.1 mg/dL — ABNORMAL HIGH (ref 0.44–1.00)

## 2021-03-22 IMAGING — CT CT NECK W/ CM
2 of 3 series · 8 of 14 positions shown, 10 images · IV contrast (omnipaque)
Comparison: [DATE]

CLINICAL DATA: Hematologic malignancy. Surveillance. Follicular
lymphoma grade 2. Lymph nodes of multiple sites.

EXAM:
CT NECK WITH CONTRAST
TECHNIQUE: Multidetector CT imaging of the neck was performed using the
standard protocol following the bolus administration of intravenous
contrast.
CONTRAST:  100mL OMNIPAQUE IOHEXOL 300 MG/ML  SOLN

[Series 11: axial neck · axial · 0.50mm/px · z∈[-469,-351]mm · 3 of 119 slices shown]
[im 30/119  bone]
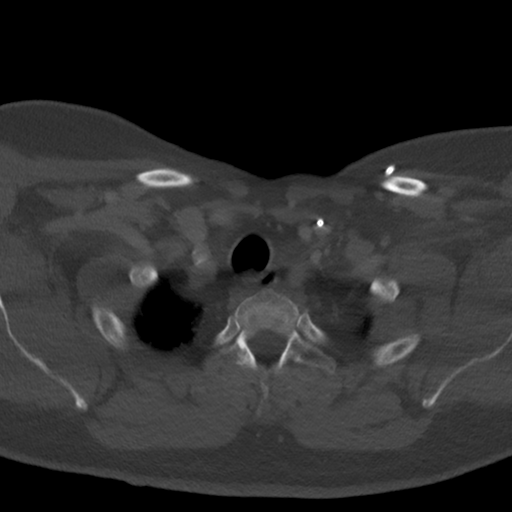
[im 60/119  bone]
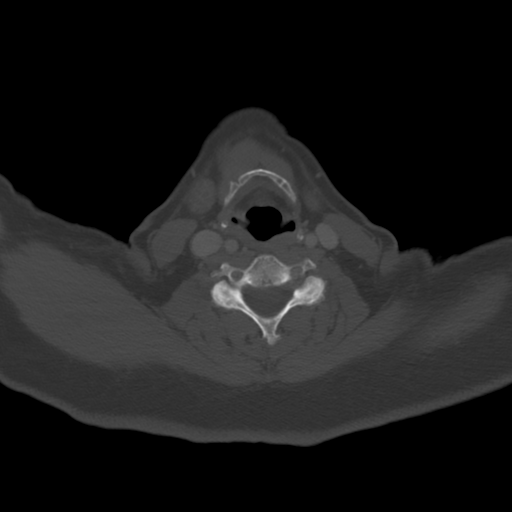
[im 89/119  bone]
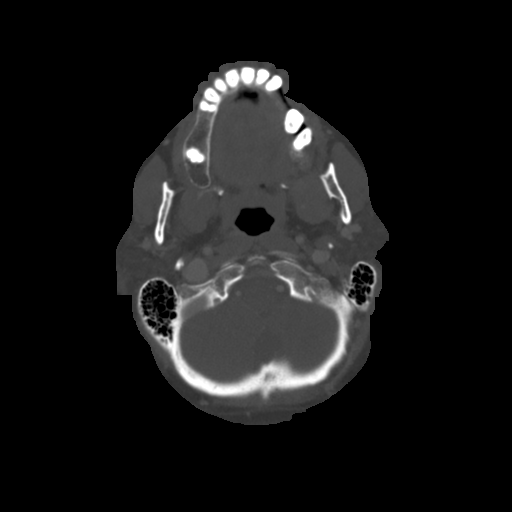

[Series 14: orthogonal ax · axial · 0.39mm/px · z∈[-540,-347]mm · 5 of 152 slices shown, 7 images]
[im 26/152  soft-tissue]
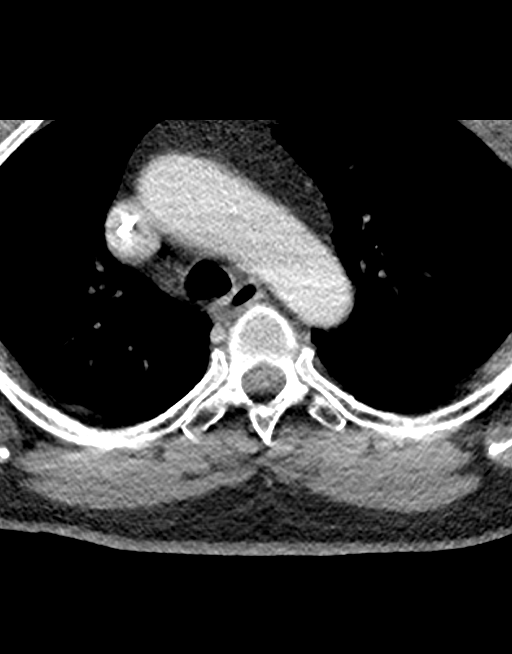
[im 26/152  bone]
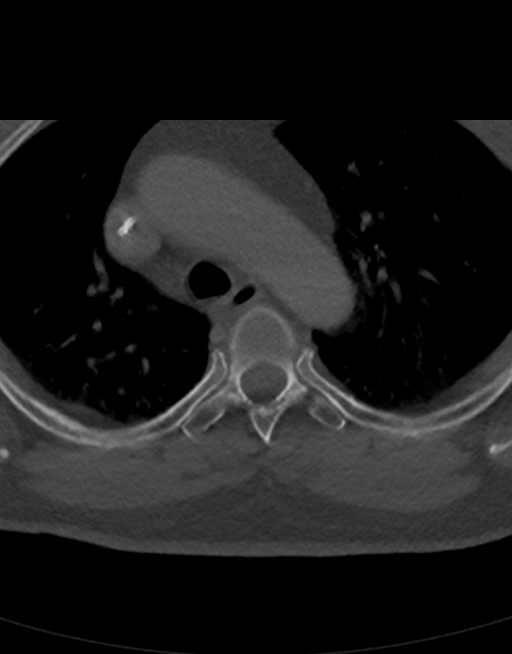
[im 51/152  bone]
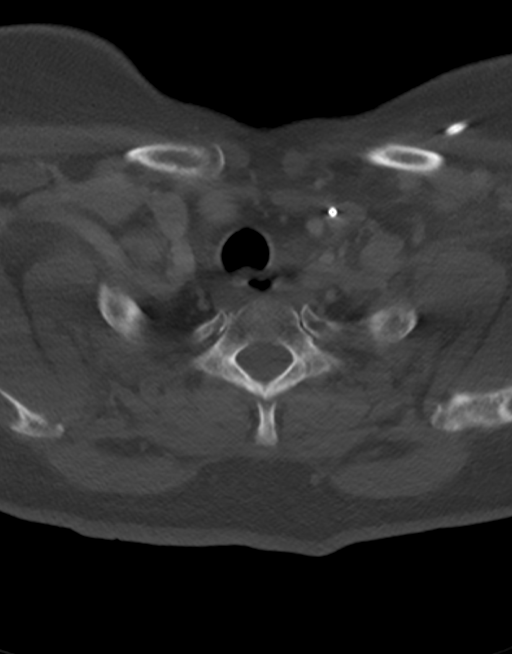
[im 76/152  bone]
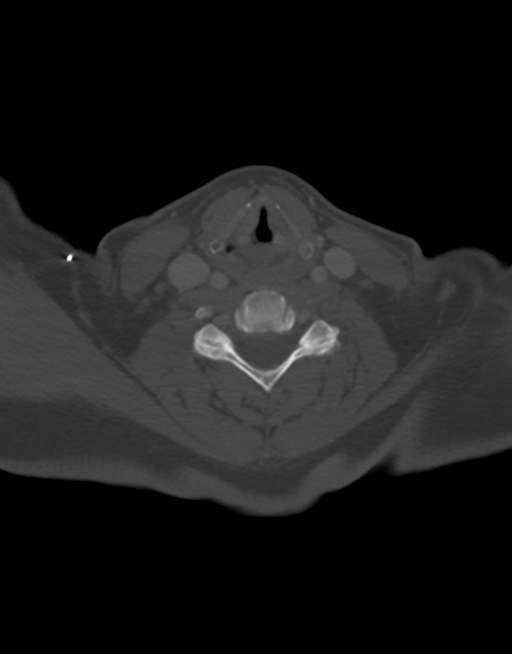
[im 101/152  bone]
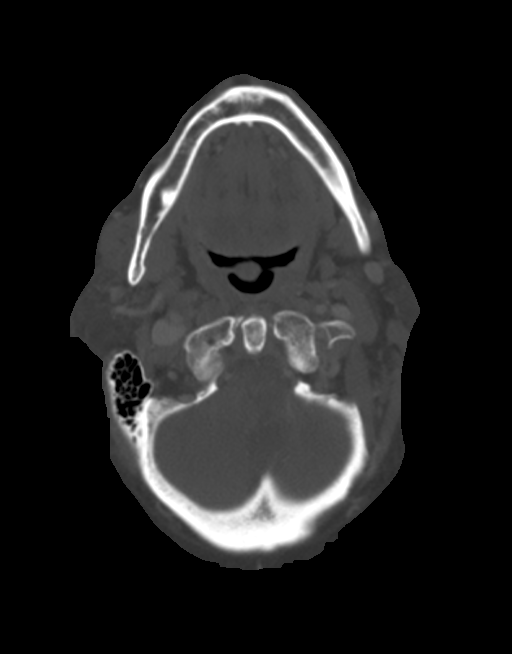
[im 126/152  soft-tissue]
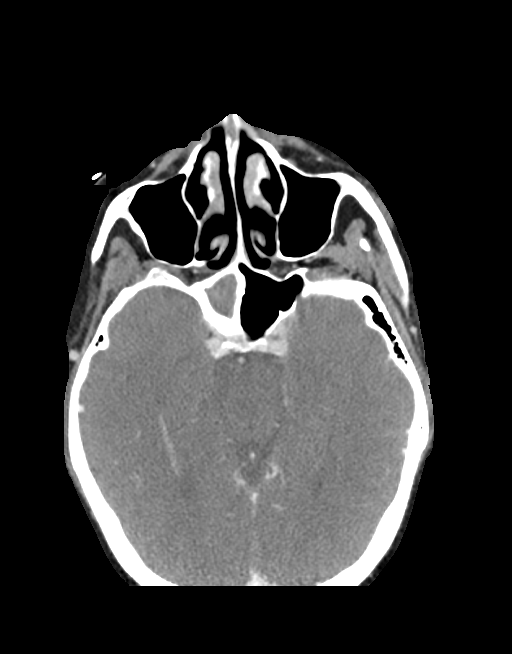
[im 126/152  bone]
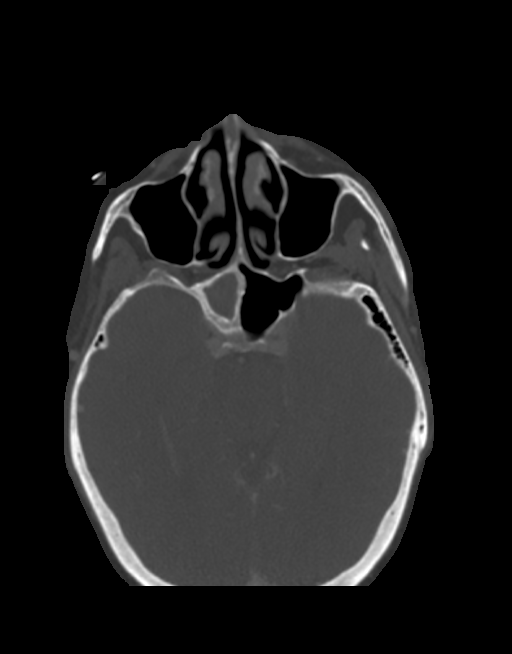

[8 of 14 positions shown; findings below may reference images not displayed]

FINDINGS: Pharynx and larynx: No mucosal or submucosal lesion.

Salivary glands: Parotid and submandibular glands are normal.

Thyroid: Normal

Lymph nodes: Resolution of previously seen bilateral
lymphadenopathy. There are no residual or recurrent enlarged nodes.

Vascular: Normal

Limited intracranial: Normal

Visualized orbits: Normal

Mastoids and visualized paranasal sinuses: Chronic opacification of
the right division of the sphenoid sinus. Other visualized sinuses
are clear. Mastoids are clear.

Skeleton: Mild midcervical spondylosis.

Upper chest: See results of chest CT.

Other: None
IMPRESSION: The neck CT is now normal. Complete resolution of previously seen
lymphadenopathy.

## 2021-03-22 IMAGING — CT CT CHEST-ABD-PELV W/ CM
2 of 3 series · 12 of 30 positions shown, 18 images · IV contrast (APPLIED)
Comparison: Multiple priors including most recent CT [DATE].

CLINICAL DATA: Follicular lymphoma grade ii, lymph nodes of
multiple sites Hematologic malignancy, surveillance

EXAM:
CT CHEST, ABDOMEN, AND PELVIS WITH CONTRAST
TECHNIQUE: Multidetector CT imaging of the chest, abdomen and pelvis was
performed following the standard protocol during bolus
administration of intravenous contrast.
CONTRAST:  100mL OMNIPAQUE IOHEXOL 300 MG/ML  SOLN

[Series 3: cap with · axial · 0.87mm/px · z∈[-1022,-502]mm · 10 of 130 slices shown, 16 images]
[im 13/130  mediastinal]
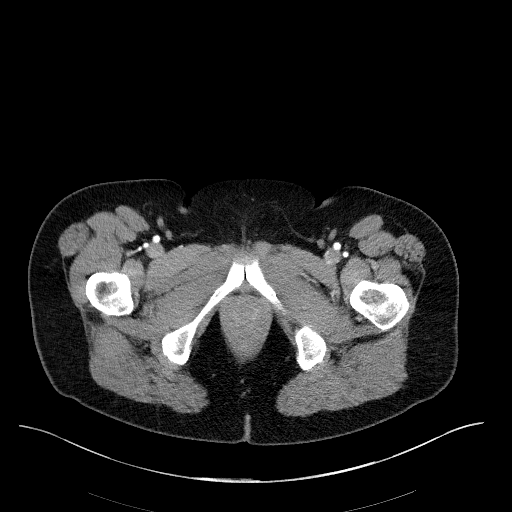
[im 13/130  bone]
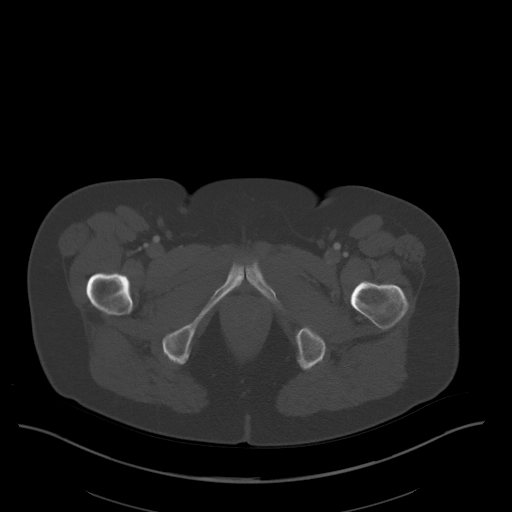
[im 26/130  mediastinal]
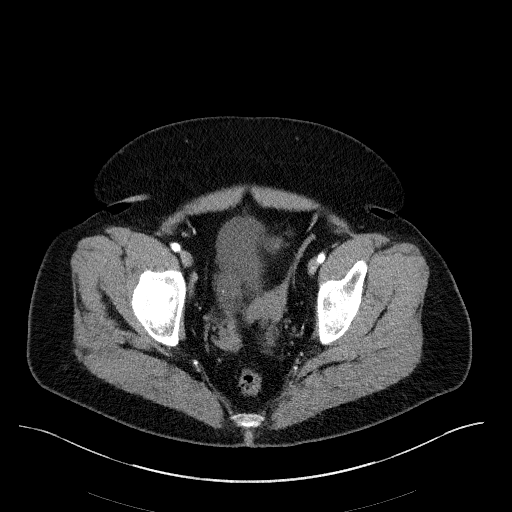
[im 39/130  mediastinal]
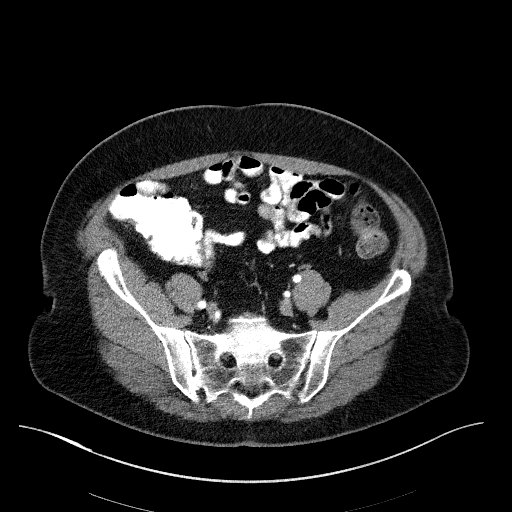
[im 52/130  mediastinal]
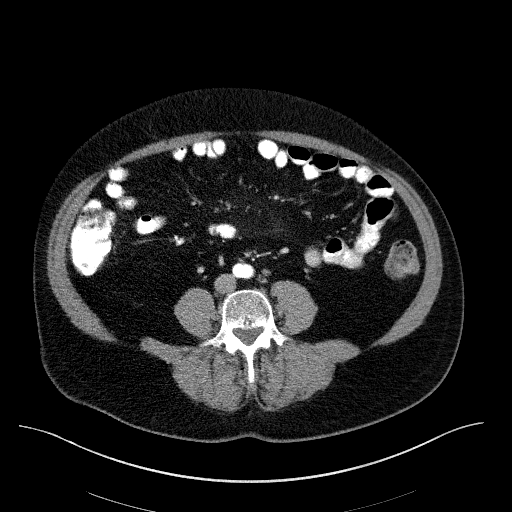
[im 64/130  mediastinal]
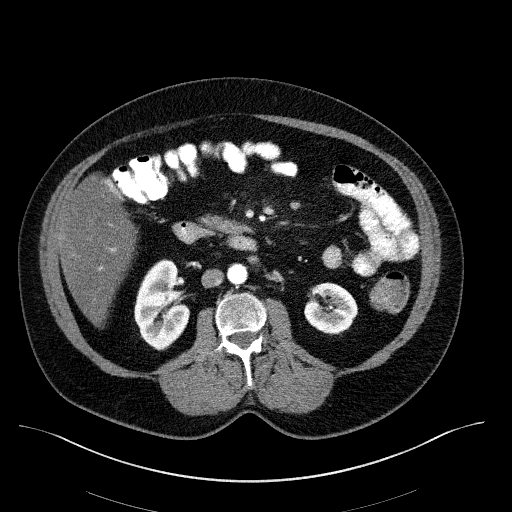
[im 65/130  mediastinal]
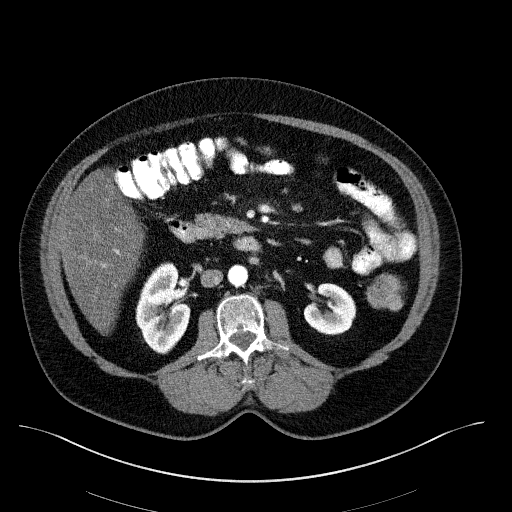
[im 78/130  mediastinal]
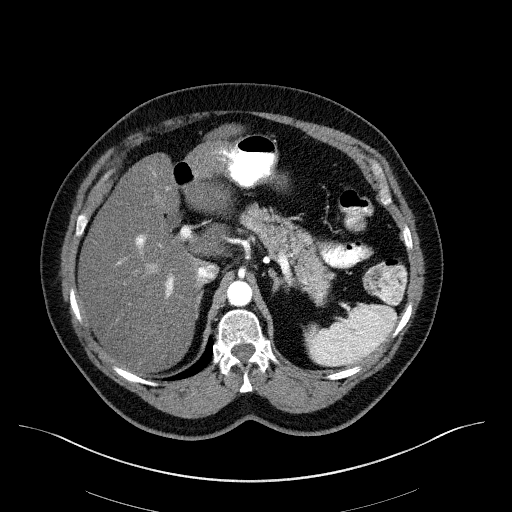
[im 78/130  lung]
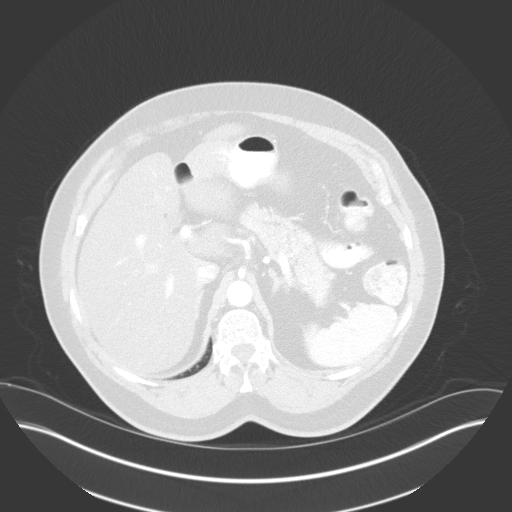
[im 91/130  mediastinal]
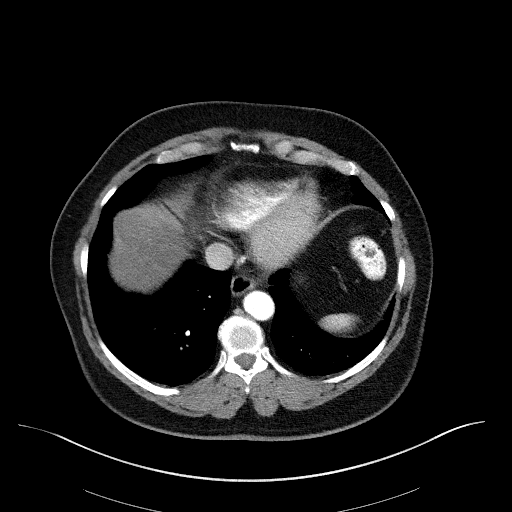
[im 91/130  lung]
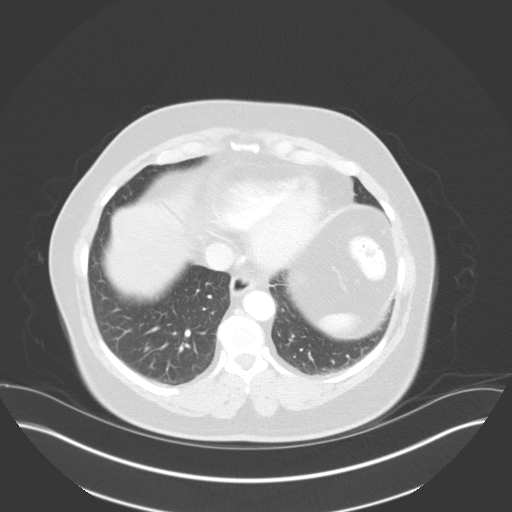
[im 104/130  mediastinal]
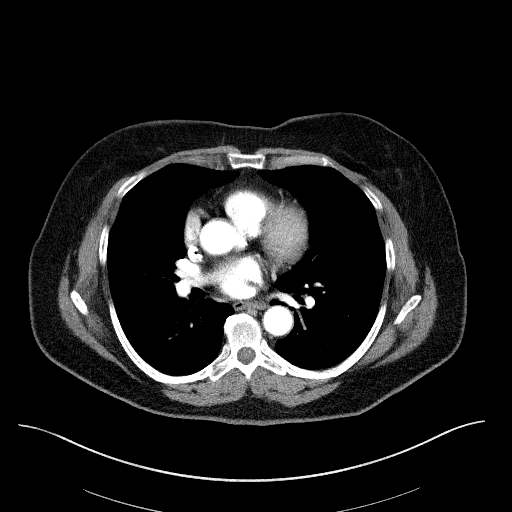
[im 104/130  lung]
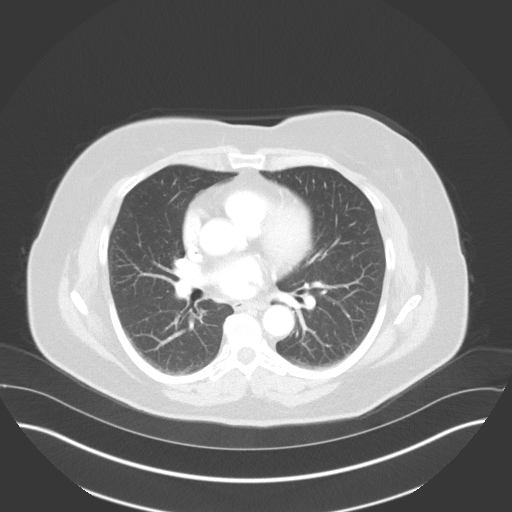
[im 104/130  bone]
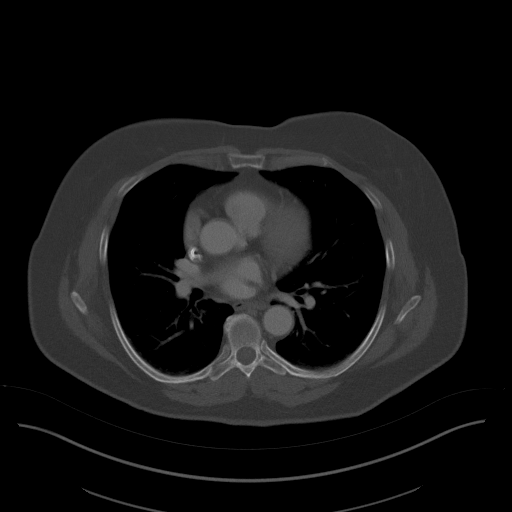
[im 117/130  mediastinal]
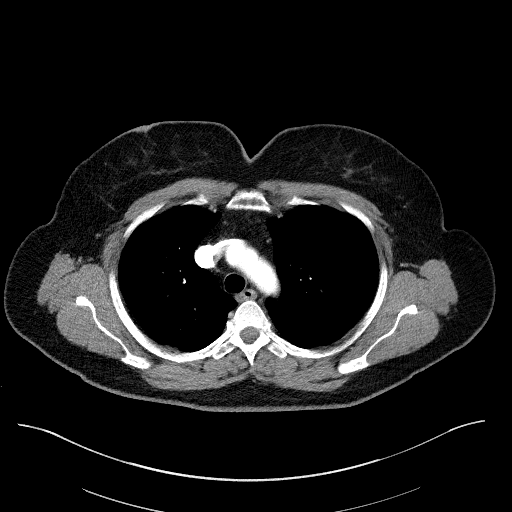
[im 117/130  lung]
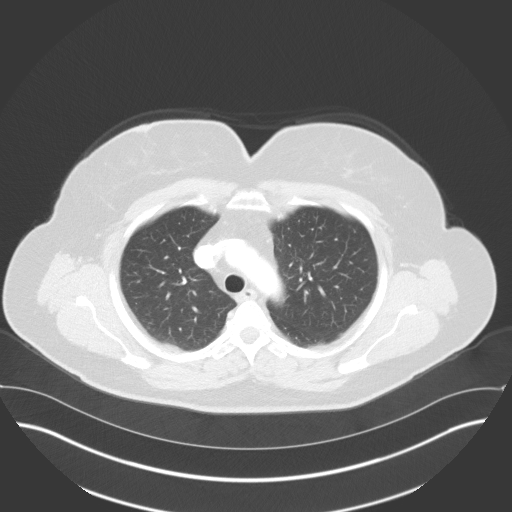

[Series 7: lung · axial · 0.87mm/px · z∈[-710,-660]mm · 2 of 149 slices shown]
[im 13/149  bone]
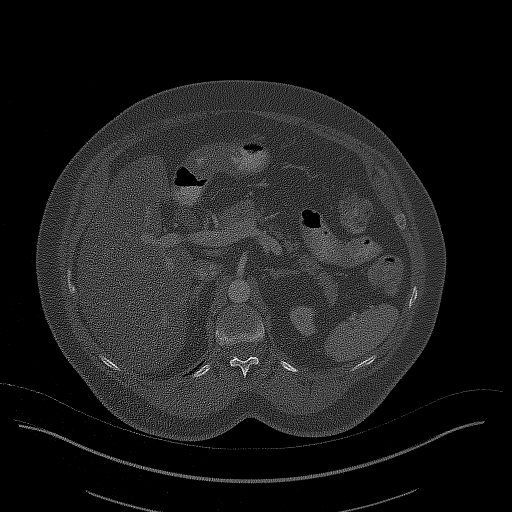
[im 38/149  bone]
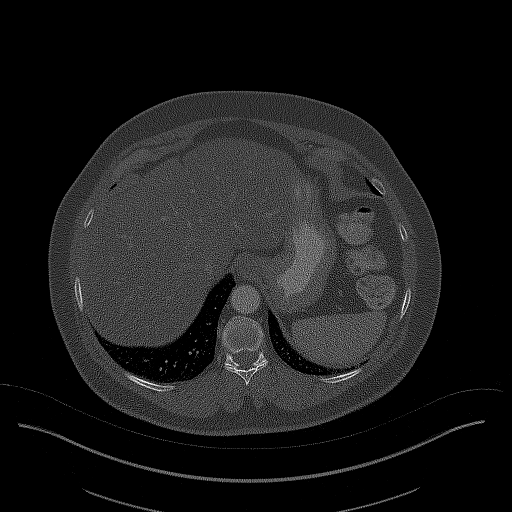

[12 of 30 positions shown; findings below may reference images not displayed]

FINDINGS: CT CHEST FINDINGS

Cardiovascular: Left chest wall Port-A-Cath with 2 at the superior
cavoatrial junction. No significant vascular findings. Normal heart
size. No pericardial effusion.

Mediastinum/Nodes: No enlarged mediastinal, hilar, or axillary lymph
nodes. Thyroid gland, trachea, and esophagus demonstrate no
significant findings.

Lungs/Pleura: No suspicious pulmonary nodules or masses. No pleural
effusion. No pneumothorax.

Musculoskeletal: No aggressive lytic or blastic lesion of bone.

CT ABDOMEN PELVIS FINDINGS

Hepatobiliary: Diffuse hepatic steatosis. No suspicious hepatic
lesion. Gallbladder surgically absent. No suspicious biliary ductal
dilation.

Pancreas: Within normal limits.

Spleen: Normal in size without focal abnormality.

Adrenals/Urinary Tract: Adrenal glands are unremarkable.
Subcentimeter right lower pole angiomyolipoma image 73/3. Otherwise
the kidneys are unremarkable, without renal calculi, suspicious
enhancing lesion, or hydronephrosis. Bladder is unremarkable.

Stomach/Bowel: Stomach is within normal limits. Colonic
diverticulosis findings acute diverticulitis. No evidence of bowel
wall thickening, distention, or inflammatory changes.

Vascular/Lymphatic: No abdominal aortic aneurysm.

Decrease in hazy appearance of the small into a with stable
prominent mesenteric lymph nodes measuring up to 5 mm.

Small left iliac node is unchanged in size measuring 6 mm on image
83/3.

Reproductive: Simple appearing 2.6 cm right ovarian cyst on image
103/3. No follow-up imaging recommended. Note: This recommendation
does not apply to premenarchal patients and to those with increased
risk (genetic, family history, elevated tumor markers or other
high-risk factors) of ovarian cancer. Reference: JACR [DATE]):[id number]

Other: No abdominopelvic ascites.

Musculoskeletal: Mild degenerative changes of the spine. No
aggressive lytic or blastic lesions of bone.
IMPRESSION: 1. Stable examination, without new or progressive adenopathy within
the chest, abdomen, or pelvis.
2. Diffuse hepatic steatosis.
3. Colonic diverticulosis without findings acute diverticulitis.

## 2021-03-22 MED ORDER — IOHEXOL 300 MG/ML  SOLN
100.0000 mL | Freq: Once | INTRAMUSCULAR | Status: AC | PRN
  Administered 2021-03-22: 100 mL via INTRAVENOUS

## 2021-03-24 ENCOUNTER — Inpatient Hospital Stay: Payer: 59

## 2021-03-24 ENCOUNTER — Inpatient Hospital Stay: Payer: 59 | Attending: Internal Medicine | Admitting: Internal Medicine

## 2021-03-24 ENCOUNTER — Encounter: Payer: Self-pay | Admitting: Internal Medicine

## 2021-03-24 VITALS — BP 181/103 | HR 74 | Temp 98.4°F | Resp 16 | Ht 63.0 in | Wt 188.0 lb

## 2021-03-24 DIAGNOSIS — K5730 Diverticulosis of large intestine without perforation or abscess without bleeding: Secondary | ICD-10-CM | POA: Insufficient documentation

## 2021-03-24 DIAGNOSIS — E8881 Metabolic syndrome: Secondary | ICD-10-CM | POA: Diagnosis not present

## 2021-03-24 DIAGNOSIS — C8218 Follicular lymphoma grade II, lymph nodes of multiple sites: Secondary | ICD-10-CM | POA: Diagnosis not present

## 2021-03-24 DIAGNOSIS — K760 Fatty (change of) liver, not elsewhere classified: Secondary | ICD-10-CM | POA: Insufficient documentation

## 2021-03-24 DIAGNOSIS — G4700 Insomnia, unspecified: Secondary | ICD-10-CM | POA: Insufficient documentation

## 2021-03-24 DIAGNOSIS — Z95828 Presence of other vascular implants and grafts: Secondary | ICD-10-CM

## 2021-03-24 DIAGNOSIS — M2550 Pain in unspecified joint: Secondary | ICD-10-CM | POA: Insufficient documentation

## 2021-03-24 DIAGNOSIS — Z79899 Other long term (current) drug therapy: Secondary | ICD-10-CM | POA: Diagnosis not present

## 2021-03-24 DIAGNOSIS — N83201 Unspecified ovarian cyst, right side: Secondary | ICD-10-CM | POA: Insufficient documentation

## 2021-03-24 DIAGNOSIS — R61 Generalized hyperhidrosis: Secondary | ICD-10-CM | POA: Insufficient documentation

## 2021-03-24 DIAGNOSIS — Z880 Allergy status to penicillin: Secondary | ICD-10-CM | POA: Diagnosis not present

## 2021-03-24 DIAGNOSIS — R232 Flushing: Secondary | ICD-10-CM | POA: Diagnosis not present

## 2021-03-24 DIAGNOSIS — Z9049 Acquired absence of other specified parts of digestive tract: Secondary | ICD-10-CM | POA: Diagnosis not present

## 2021-03-24 DIAGNOSIS — G8929 Other chronic pain: Secondary | ICD-10-CM | POA: Diagnosis not present

## 2021-03-24 DIAGNOSIS — R5383 Other fatigue: Secondary | ICD-10-CM | POA: Diagnosis not present

## 2021-03-24 LAB — COMPREHENSIVE METABOLIC PANEL
ALT: 46 U/L — ABNORMAL HIGH (ref 0–44)
AST: 42 U/L — ABNORMAL HIGH (ref 15–41)
Albumin: 4.6 g/dL (ref 3.5–5.0)
Alkaline Phosphatase: 96 U/L (ref 38–126)
Anion gap: 9 (ref 5–15)
BUN: 17 mg/dL (ref 8–23)
CO2: 21 mmol/L — ABNORMAL LOW (ref 22–32)
Calcium: 9.5 mg/dL (ref 8.9–10.3)
Chloride: 107 mmol/L (ref 98–111)
Creatinine, Ser: 0.92 mg/dL (ref 0.44–1.00)
GFR, Estimated: >60 mL/min (ref >60)
Glucose, Bld: 76 mg/dL (ref 70–99)
Potassium: 4 mmol/L (ref 3.5–5.1)
Sodium: 137 mmol/L (ref 135–145)
Total Bilirubin: 0.6 mg/dL (ref 0.3–1.2)
Total Protein: 7.1 g/dL (ref 6.5–8.1)

## 2021-03-24 LAB — CBC WITH DIFFERENTIAL/PLATELET
Abs Immature Granulocytes: 0.13 10*3/uL — ABNORMAL HIGH (ref 0.00–0.07)
Basophils Absolute: 0 10*3/uL (ref 0.0–0.1)
Basophils Relative: 0 %
Eosinophils Absolute: 0.2 10*3/uL (ref 0.0–0.5)
Eosinophils Relative: 4 %
HCT: 41.5 % (ref 36.0–46.0)
Hemoglobin: 14.2 g/dL (ref 12.0–15.0)
Immature Granulocytes: 3 %
Lymphocytes Relative: 23 %
Lymphs Abs: 1 10*3/uL (ref 0.7–4.0)
MCH: 31.7 pg (ref 26.0–34.0)
MCHC: 34.2 g/dL (ref 30.0–36.0)
MCV: 92.6 fL (ref 80.0–100.0)
Monocytes Absolute: 0.6 10*3/uL (ref 0.1–1.0)
Monocytes Relative: 14 %
Neutro Abs: 2.5 10*3/uL (ref 1.7–7.7)
Neutrophils Relative %: 56 %
Platelets: 191 10*3/uL (ref 150–400)
RBC: 4.48 MIL/uL (ref 3.87–5.11)
RDW: 13.4 % (ref 11.5–15.5)
WBC: 4.5 10*3/uL (ref 4.0–10.5)
nRBC: 0 % (ref 0.0–0.2)

## 2021-03-24 MED ORDER — SODIUM CHLORIDE 0.9% FLUSH
10.0000 mL | Freq: Once | INTRAVENOUS | Status: AC | PRN
  Administered 2021-03-24: 10 mL
  Filled 2021-03-24: qty 10

## 2021-03-24 MED ORDER — ZOLPIDEM TARTRATE ER 6.25 MG PO TBCR: 6.2500 mg | EXTENDED_RELEASE_TABLET | Freq: Every evening | ORAL | 0 refills | Status: DC | PRN

## 2021-03-24 MED ORDER — HEPARIN SOD (PORK) LOCK FLUSH 100 UNIT/ML IV SOLN
500.0000 [IU] | Freq: Once | INTRAVENOUS | Status: AC | PRN
  Administered 2021-03-24: 500 [IU]
  Filled 2021-03-24: qty 5

## 2021-03-24 MED ORDER — AMLODIPINE BESYLATE 5 MG PO TABS
5.0000 mg | ORAL_TABLET | Freq: Every day | ORAL | 0 refills | Status: DC
Start: 2021-03-24 — End: 2021-08-18

## 2021-03-24 NOTE — Patient Instructions (Signed)
c 

## 2021-03-24 NOTE — Progress Notes (Signed)
[facility] CONSULT NOTE  Patient Care Team: Peggye Form, NP as PCP - General (Family Medicine) Herbert Pun, MD as Consulting Physician (General Surgery) Cammie Sickle, MD as Consulting Physician (Internal Medicine) Herbert Pun, MD as Consulting Physician (General Surgery)  CHIEF COMPLAINTS/PURPOSE OF CONSULTATION:   Oncology History Overview Note  # MAY 2021- Bilateral neck adenopathy right more than left; highly concerning for lymphoproliferative disorder.  Excisional biopsy right neck- [Dr.Cintron];  A. 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   B. 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   # MAY 2021-CBC/CMP normal /  mildly elevated alkaline phosphatase. [PCP; [facility]].  # [name], [id number]  # SURVIVORSHIP:   # GENETICS:   DIAGNOSIS: Follicle lymphoma  STAGE: III        ;  GOALS: Control  CURRENT/MOST RECENT THERAPY : Bendamustine Rituxan [C]     Follicular lymphoma grade ii, lymph nodes of multiple sites ([place])  01/29/2020 Initial Diagnosis   Follicular lymphoma grade ii, lymph nodes of multiple sites ([place])   03/02/2020 -  Chemotherapy   The patient had dexamethasone (DECADRON) 4 MG tablet, 8 mg, Oral, Daily, 1 of 1 cycle, Start date: --, End date: -- palonosetron (ALOXI) injection 0.25 mg, 0.25 mg, Intravenous,  Once, 6 of 6 cycles Administration: 0.25 mg (03/02/2020), 0.25 mg (04/04/2020), 0.25 mg (05/03/2020), 0.25 mg (07/26/2020), 0.25 mg (06/28/2020), 0.25 mg (08/23/2020) pegfilgrastim-cbqv (UDENYCA) injection 6 mg, 6 mg, Subcutaneous, Once, 3 of 3 cycles Administration: 6 mg (06/30/2020), 6 mg (07/28/2020), 6 mg (08/25/2020) bendamustine (BENDEKA) 175 mg in sodium chloride 0.9 % 50 mL (3.0702 mg/mL) chemo infusion, 90 mg/m2 = 175 mg, Intravenous,  Once, 6 of 6 cycles Administration: 175 mg (03/02/2020), 175 mg  (03/03/2020), 175 mg (04/04/2020), 175 mg (04/05/2020), 175 mg (05/03/2020), 175 mg (05/04/2020), 175 mg (07/26/2020), 175 mg (07/27/2020), 175 mg (06/28/2020), 175 mg (06/29/2020), 175 mg (08/23/2020), 175 mg (08/24/2020) riTUXimab-pvvr (RUXIENCE) 700 mg in sodium chloride 0.9 % 250 mL (2.1875 mg/mL) infusion, 375 mg/m2 = 700 mg, Intravenous,  Once, 2 of 2 cycles Administration: 700 mg (03/02/2020)   for chemotherapy treatment.      HISTORY OF PRESENTING ILLNESS:  Danielle Lewis 65 y.o.  female  follicular lymphoma grade 1-2 currently on bendamustine Rituxan is here for follow-uP/review results of the CT scan.  Patient denies any new lumps or bumps.  Denies any nausea vomiting abdominal pain.  Continues to have hot flashes.  Continues to have chronic joint pains.  Also chronic fatigue.  Review of Systems  Constitutional:  Positive for diaphoresis and malaise/fatigue. Negative for chills, fever and weight loss.  HENT:  Negative for nosebleeds and sore throat.   Eyes:  Negative for double vision.  Respiratory:  Negative for cough, hemoptysis, sputum production, shortness of breath and wheezing.   Cardiovascular:  Negative for chest pain, palpitations, orthopnea and leg swelling.  Gastrointestinal:  Negative for abdominal pain, blood in stool, constipation, diarrhea, heartburn, melena, nausea and vomiting.  Genitourinary:  Negative for dysuria, frequency and urgency.  Musculoskeletal:  Negative for back pain and joint pain.  Skin: Negative.  Negative for itching and rash.  Neurological:  Negative for dizziness, tingling, focal weakness, weakness and headaches.  Endo/Heme/Allergies:  Does not bruise/bleed easily.  Psychiatric/Behavioral:  Negative for depression. The patient has insomnia. The patient is not nervous/anxious.     MEDICAL HISTORY:  Past Medical History:  Diagnosis Date   Anxiety    GERD (gastroesophageal reflux disease)    Hypertension     SURGICAL HISTORY: Past Surgical History:   Procedure Laterality Date   ABDOMINAL HYSTERECTOMY     APPENDECTOMY     CHOLECYSTECTOMY     29years ago   LYMPH GLAND EXCISION Right 01/22/2020   Procedure: CERVICAL LYMPH GLAND EXCISION;  Surgeon: Herbert Pun, MD;  Location: [facility] ORS;  Service: General;  Laterality: Right;   PORTACATH PLACEMENT N/A 02/24/2020   Procedure: INSERTION PORT-A-CATH;  Surgeon: Herbert Pun, MD;  Location: [facility] ORS;  Service: General;  Laterality: N/A;   TONSILLECTOMY      SOCIAL HISTORY: Social History   Socioeconomic History   Marital status: Married    Spouse name: Not on file   Number of children: Not on file   Years of education: Not on file   Highest education level: Not on file  Occupational History   Not on file  Tobacco Use   Smoking status: Never   Smokeless tobacco: Never  Vaping Use   Vaping Use: Never used  Substance and Sexual Activity   Alcohol use: Yes    Comment: occassional   Drug use: Not Currently   Sexual activity: Not on file  Other Topics Concern   Not on file  Social History Narrative   Lives in [place]; [place]. Never smoked; wine rarely. Used in work in [employer]. With husband.    Social Determinants of Health   Financial Resource Strain: Not on file  Food Insecurity: Not on file  Transportation Needs: Not on file  Physical Activity: Not on file  Stress: Not on file  Social Connections: Not on file  Intimate Partner Violence: Not on file    FAMILY HISTORY: Family History  Problem Relation Age of Onset   Cancer Mother        unknown type    ALLERGIES:  is allergic to penicillins.  MEDICATIONS:  Current Outpatient Medications  Medication Sig Dispense Refill   acyclovir (ZOVIRAX) 400 MG tablet Take 1 tablet by mouth once daily 90 tablet 0   ALPRAZolam (XANAX) 0.5 MG tablet Take 0.5 mg by mouth 2 (two) times daily as needed.     diclofenac Sodium (VOLTAREN) 1 % GEL Apply 2 g topically 4 (four) times daily.     escitalopram  (LEXAPRO) 10 MG tablet Take 10 mg by mouth daily.     lidocaine-prilocaine (EMLA) cream Apply 1 application topically once as needed for up to 1 dose. Apply the cream generously to the port site 45-60 mins prior to access. 30 g 0   ondansetron (ZOFRAN) 4 MG tablet TAKE 1 TABLET BY MOUTH EVERY 8 HOURS AS NEEDED FOR NAUSEA FOR VOMITING 20 tablet 0   prochlorperazine (COMPAZINE) 10 MG tablet Take 1 tablet (10 mg total) by mouth every 6 (six) hours as needed for nausea or vomiting. 40 tablet 1   amLODipine (NORVASC) 5 MG tablet Take 1 tablet (5 mg total) by mouth daily. 30 tablet 0   naproxen (NAPROSYN) 500 MG tablet Take 500 mg by mouth 2 (two) times daily as needed. (Patient not taking: Reported on 03/24/2021)     zolpidem (AMBIEN CR) 6.25 MG CR tablet Take 1 tablet (6.25 mg total) by mouth at bedtime as needed. for sleep 30 tablet 0   No current facility-administered medications for this visit.      Marland Kitchen  PHYSICAL EXAMINATION: ECOG PERFORMANCE STATUS: 0 - Asymptomatic  Vitals:   03/24/21 1334  BP: (!) 181/103  Pulse: 74  Resp: 16  Temp: 98.4 F (36.9 C)  SpO2: 98%   Filed Weights   03/24/21 1334  Weight: 188 lb (85.3 kg)    Physical Exam Constitutional:      Comments: Walk independently.  Accompanied by family.  HENT:     Head: Normocephalic and atraumatic.     Mouth/Throat:     Pharynx: No oropharyngeal exudate.  Eyes:     Pupils: Pupils are equal, round, and reactive to light.  Neck:     Comments: Neck lymphadenopathy improved. Cardiovascular:     Rate and Rhythm: Normal rate and regular rhythm.  Pulmonary:     Effort: Pulmonary effort is normal. No respiratory distress.     Breath sounds: Normal breath sounds. No wheezing.  Abdominal:     General: Bowel sounds are normal. There is no distension.     Palpations: Abdomen is soft. There is no mass.     Tenderness: no abdominal tenderness There is no guarding or rebound.  Musculoskeletal:        General: No tenderness.  Normal range of motion.     Cervical back: Normal range of motion and neck supple.  Skin:    General: Skin is warm.  Neurological:     Mental Status: She is alert and oriented to person, place, and time.  Psychiatric:        Mood and Affect: Affect normal.     LABORATORY DATA:  I have reviewed the data as listed Lab Results  Component Value Date   WBC 4.5 03/24/2021   HGB 14.2 03/24/2021   HCT 41.5 03/24/2021   MCV 92.6 03/24/2021   PLT 191 03/24/2021   Recent Labs    04/04/20 0910 05/03/20 0806 05/31/20 0824 08/23/20 0816 12/12/20 1330 03/22/21 1122 03/24/21 1435  NA 139 139   < > 134* 139  --  137  K 3.7 3.5   < > 3.7 3.3*  --  4.0  CL 108 108   < > 103 107  --  107  CO2 21* 22   < > 19* 22  --  21*  GLUCOSE 133* 113*   < > 237* 119*  --  76  BUN 17 15   < > 15 17  --  17  CREATININE 0.97 1.03*   < > 0.92 0.85 1.10* 0.92  CALCIUM 9.2 9.2   < > 8.8* 9.4  --  9.5  GFRNONAA >60 58*   < > >60 >60  --  >60  GFRAA >60 >60  --   --   --   --   --   PROT 6.8 6.8   < > 6.7 6.5  --  7.1  ALBUMIN 4.2 4.1   < > 4.1 4.2  --  4.6  AST 30 34   < > 34 34  --  42*  ALT 33 26   < > 36 26  --  46*  ALKPHOS 86 86   < > 105 89  --  96  BILITOT 0.8 1.0   < > 1.1 0.9  --  0.6   < > = values in this interval not displayed.    RADIOGRAPHIC STUDIES: I have personally reviewed the radiological images as listed and agreed with the findings in the report. CT SOFT TISSUE NECK W CONTRAST  Result Date: 03/23/2021 CLINICAL DATA:  Hematologic malignancy. Surveillance. Follicular lymphoma grade 2.  Lymph nodes of multiple sites. EXAM: CT NECK WITH CONTRAST TECHNIQUE: Multidetector CT imaging of the neck was performed using the standard protocol following the bolus administration of intravenous contrast. CONTRAST:  143m OMNIPAQUE IOHEXOL 300 MG/ML  SOLN COMPARISON:  01/06/2020 FINDINGS: Pharynx and larynx: No mucosal or submucosal lesion. Salivary glands: Parotid and submandibular glands are  normal. Thyroid: Normal Lymph nodes: Resolution of previously seen bilateral lymphadenopathy. There are no residual or recurrent enlarged nodes. Vascular: Normal Limited intracranial: Normal Visualized orbits: Normal Mastoids and visualized paranasal sinuses: Chronic opacification of the right division of the sphenoid sinus. Other visualized sinuses are clear. Mastoids are clear. Skeleton: Mild midcervical spondylosis. Upper chest: See results of chest CT. Other: None IMPRESSION: The neck CT is now normal. Complete resolution of previously seen lymphadenopathy. Electronically Signed   By: MNelson ChimesM.D.   On: 03/23/2021 11:49   CT CHEST ABDOMEN PELVIS W CONTRAST  Result Date: 03/23/2021 CLINICAL DATA:  Follicular lymphoma grade ii, lymph nodes of multiple sites Hematologic malignancy, surveillance EXAM: CT CHEST, ABDOMEN, AND PELVIS WITH CONTRAST TECHNIQUE: Multidetector CT imaging of the chest, abdomen and pelvis was performed following the standard protocol during bolus administration of intravenous contrast. CONTRAST:  1024mOMNIPAQUE IOHEXOL 300 MG/ML  SOLN COMPARISON:  Multiple priors including most recent CT February 06, 2021. FINDINGS: CT CHEST FINDINGS Cardiovascular: Left chest wall Port-A-Cath with 2 at the superior cavoatrial junction. No significant vascular findings. Normal heart size. No pericardial effusion. Mediastinum/Nodes: No enlarged mediastinal, hilar, or axillary lymph nodes. Thyroid gland, trachea, and esophagus demonstrate no significant findings. Lungs/Pleura: No suspicious pulmonary nodules or masses. No pleural effusion. No pneumothorax. Musculoskeletal: No aggressive lytic or blastic lesion of bone. CT ABDOMEN PELVIS FINDINGS Hepatobiliary: Diffuse hepatic steatosis. No suspicious hepatic lesion. Gallbladder surgically absent. No suspicious biliary ductal dilation. Pancreas: Within normal limits. Spleen: Normal in size without focal abnormality. Adrenals/Urinary Tract: Adrenal glands  are unremarkable. 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 Otherwise the kidneys are unremarkable, without renal calculi, suspicious enhancing lesion, or hydronephrosis. Bladder is unremarkable. Stomach/Bowel: Stomach is within normal limits. Colonic diverticulosis findings acute diverticulitis. No evidence of bowel wall thickening, distention, or inflammatory changes. Vascular/Lymphatic: No abdominal aortic aneurysm. Decrease in hazy appearance of the small into a with stable prominent mesenteric lymph nodes measuring up to 5 mm. Small left iliac node is unchanged in size measuring 6 mm on image 83/3. Reproductive: Simple appearing 2.6 cm right ovarian cyst on image 103/3. No follow-up imaging recommended. Note: This recommendation does not apply to premenarchal patients and to those with increased risk (genetic, family history, elevated tumor markers or other high-risk factors) of ovarian cancer. Reference: JACR 2020 Feb; 17(2):248-254 Other: No abdominopelvic ascites. Musculoskeletal: Mild degenerative changes of the spine. No aggressive lytic or blastic lesions of bone. IMPRESSION: 1. Stable examination, without new or progressive adenopathy within the chest, abdomen, or pelvis. 2. Diffuse hepatic steatosis. 3. Colonic diverticulosis without findings acute diverticulitis. Electronically Signed   By: JeDahlia BailiffD   On: 03/23/2021 12:57    ASSESSMENT & PLAN:   Follicular lymphoma grade ii, [place] of [place] sites [place] Follicular lymphoma grade 1-2; at least stage III; s/p 6 cycles of Bendamustine Rituxan.  July 2022-CT scan neck chest abdomen pelvis-negative for any progressive adenopathy/or any concerns for new lymphadenopathy or lesions; see discussion below regarding fatty liver/ovarian cyst  # HTN: poorly controlled [defer to PCP; Glenda [id number].  Discussed that poorly controlled blood pressures can cause a stroke/heart  attacks and other cardiovascular  problems. recommend compliance with medication/also checking blood pressures at home frequently.keep a log of blood pressures/and bring it to PCPs attention.  Discussed that we will refill Norvasc 5 mg once a day [patient currently taking 2.5 mg]-for 30 days.  Patient will need to follow-up with PCP regarding further refills/follow-up of high blood pressure.  #Right ovarian cyst-appears simple; patient is post menopausal-as per radiology recommendations will refer to gynecology.  Also patient needs screening Pap smears.  #Fatty liver/metabolic syndrome/elevated hypertension- discussed importance of healthy weight/and weight loss.  Strongly recommend eating more green leafy vegetables and cutting down processed food/ carbohydrates.  Instead increasing whole grains / protein in the diet.  Multiple studies have shown that optimal weight would help improve cardiovascular risk; also shown to cut on the risk of malignancies-colon cancer, breast cancer ovarian/uterine cancer in women and also prostate cancer in men.   # port/IV access- Stable; discussed re: pro and cons of keeping the port vs. Explantation.  I think it is reasonable to take the port out at this time.  Will refer to Dr. Peyton Najjar.  #Insomnia-refill given for Ambien 30 pills.  Again reviewed with the patient that I will not be prescribing Ambien moving forward/patient need to follow-up PCP  # DISPOSITION:  # labs-cbc/cmp/LDH; port flush today # refer to [place] for port explant # refer to [facility] re: right ovary cyst # follow up in 6 months;MD; labs- cbc/cmp/LDH- Dr.B  # I reviewed the blood work- with the patient in detail; also reviewed the imaging independently [as summarized above]; and with the patient in detail.    Cc; Dr.Linthavong/Ms. fields NP.   All questions were answered. The patient knows to call the clinic with any problems, questions or concerns.    Cammie Sickle, MD 03/25/2021 11:04 AM

## 2021-03-24 NOTE — Assessment & Plan Note (Addendum)
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  July 2022-CT scan neck chest abdomen pelvis-negative for any progressive adenopathy/or any concerns for new lymphadenopathy or lesions; see discussion below regarding fatty liver/ovarian cyst  # HTN: poorly controlled [defer to PCP; Glenda [id number] systolic.  Discussed that poorly controlled blood pressures can cause a stroke/heart attacks and other cardiovascular problems. recommend compliance with medication/also checking blood pressures at home frequently.keep a log of blood pressures/and bring it to PCPs attention.  Discussed that we will refill Norvasc 5 mg once a day [patient currently taking 2.5 mg]-for 30 days.  Patient will need to follow-up with PCP regarding further refills/follow-up of high blood pressure.  #Right ovarian cyst-appears simple; patient is post menopausal-as per radiology recommendations will refer to gynecology.  Also patient needs screening Pap smears.  #Fatty liver/metabolic syndrome/elevated hypertension- discussed importance of healthy weight/and weight loss.  Strongly recommend eating more green leafy vegetables and cutting down processed food/ carbohydrates.  Instead increasing whole grains / protein in the diet.  Multiple studies have shown that optimal weight would help improve cardiovascular risk; also shown to cut on the risk of malignancies-colon cancer, breast cancer ovarian/uterine cancer in women and also prostate cancer in men.   # port/IV access- Stable; discussed re: pro and cons of keeping the port vs. Explantation.  I think it is reasonable to take the port out at this time.  Will refer to Dr. Peyton Najjar.  #Insomnia-refill given for Ambien 30 pills.  Again reviewed with the patient that I will not be prescribing Ambien moving forward/patient need to follow-up PCP  # DISPOSITION:  # labs-cbc/cmp/LDH; port flush today # refer to [place] for port explant # refer to  [facility] re: right ovary cyst # follow up in 6 months;MD; labs- cbc/cmp/LDH- Dr.B  # I reviewed the blood work- with the patient in detail; also reviewed the imaging independently [as summarized above]; and with the patient in detail.    Cc; Dr.Linthavong/Ms. fields NP.

## 2021-03-25 ENCOUNTER — Encounter: Payer: Self-pay | Admitting: Internal Medicine

## 2021-03-27 ENCOUNTER — Encounter: Payer: Self-pay | Admitting: Internal Medicine

## 2021-03-27 ENCOUNTER — Encounter: Payer: Self-pay | Admitting: Obstetrics and Gynecology

## 2021-03-27 ENCOUNTER — Other Ambulatory Visit: Payer: Self-pay

## 2021-03-27 ENCOUNTER — Other Ambulatory Visit: Payer: 59

## 2021-03-27 ENCOUNTER — Ambulatory Visit (INDEPENDENT_AMBULATORY_CARE_PROVIDER_SITE_OTHER): Payer: 59 | Admitting: Obstetrics and Gynecology

## 2021-03-27 VITALS — BP 138/92 | Ht 64.0 in | Wt 189.0 lb

## 2021-03-27 DIAGNOSIS — Z124 Encounter for screening for malignant neoplasm of cervix: Secondary | ICD-10-CM | POA: Diagnosis not present

## 2021-03-27 DIAGNOSIS — Z1231 Encounter for screening mammogram for malignant neoplasm of breast: Secondary | ICD-10-CM

## 2021-03-27 DIAGNOSIS — N83201 Unspecified ovarian cyst, right side: Secondary | ICD-10-CM | POA: Diagnosis not present

## 2021-03-27 NOTE — Progress Notes (Signed)
Obstetrics & Gynecology Office Visit   Chief Complaint:  Chief Complaint  Patient presents with   Ovarian Cyst    Referral - RM 4    History of Present Illness: The patient is a 65 y.o. female presenting for consultation at the request of Dr. Charlaine Dalton  concerning a recently imaged right adnexal cyst.  Initial presentation was prompted by  routine surveillance CT Follicular Lymphoma .  Previous CT imaging demonstrated dimensions of 2.6cm.  Appearance was notable simple cyst, no free fluid, no lymphadenopathy, no omental caking, and absence of ascites. The patient is asymptomatic.  There is  no notable family history of ovarian cancer, uterine cancer, breast cancer, or colon cancer.  Review of Systems: 10 point review of systems negative unless otherwise noted in HPI  Past Medical History:  Patient Active Problem List   Diagnosis Date Noted   Goals of care, counseling/discussion 02/12/2020   Essential hypertension [id number]   Follicular lymphoma grade ii, lymph nodes of multiple sites ([place]) 01/29/2020   Left bundle branch block (LBBB) on electrocardiogram 01/20/2020   Generalized enlarged lymph nodes 01/11/2020    Past Surgical History:  Past Surgical History:  Procedure Laterality Date   ABDOMINAL HYSTERECTOMY     APPENDECTOMY     CHOLECYSTECTOMY     28years ago   LYMPH GLAND EXCISION Right 01/22/2020   Procedure: CERVICAL LYMPH GLAND EXCISION;  Surgeon: Herbert Pun, MD;  Location: [facility] ORS;  Service: General;  Laterality: Right;   PORTACATH PLACEMENT N/A 02/24/2020   Procedure: INSERTION PORT-A-CATH;  Surgeon: Herbert Pun, MD;  Location: [facility] ORS;  Service: General;  Laterality: N/A;   TONSILLECTOMY      Gynecologic History: No LMP recorded. Patient is postmenopausal.  Obstetric History: G10P0  Family History:  Family History  Problem Relation Age of Onset   Cancer Mother        unknown type    Social History:  Social History    Socioeconomic History   Marital status: Married    Spouse name: Not on file   Number of children: Not on file   Years of education: Not on file   Highest education level: Not on file  Occupational History   Not on file  Tobacco Use   Smoking status: Never   Smokeless tobacco: Never  Vaping Use   Vaping Use: Never used  Substance and Sexual Activity   Alcohol use: Yes    Comment: occassional   Drug use: Not Currently   Sexual activity: Yes    Birth control/protection: Surgical    Comment: Tubal  Other Topics Concern   Not on file  Social History Narrative   Lives in [place]; [place]. Never smoked; wine rarely. Used in work in [employer]. With husband.    Social Determinants of Health   Financial Resource Strain: Not on file  Food Insecurity: Not on file  Transportation Needs: Not on file  Physical Activity: Not on file  Stress: Not on file  Social Connections: Not on file  Intimate Partner Violence: Not on file    Allergies:  Allergies  Allergen Reactions   Penicillins Rash    Medications: Prior to Admission medications   Medication Sig Start Date End Date Taking? Authorizing Provider  ALPRAZolam Duanne Moron) 0.5 MG tablet Take 0.5 mg by mouth 2 (two) times daily as needed. 06/06/20  Yes [provider]  amLODipine (NORVASC) 5 MG tablet Take 1 tablet (5 mg total) by mouth daily. 03/24/21  Yes Charlaine Dalton  R, MD  diclofenac Sodium (VOLTAREN) 1 % GEL Apply 2 g topically 4 (four) times daily. 11/08/20 11/08/21 Yes [provider]  escitalopram (LEXAPRO) 10 MG tablet Take 10 mg by mouth daily. 05/28/20  Yes [provider]  lidocaine-prilocaine (EMLA) cream Apply 1 application topically once as needed for up to 1 dose. Apply the cream generously to the port site 45-60 mins prior to access. 12/12/20  Yes Cammie Sickle, MD  ondansetron (ZOFRAN) 4 MG tablet TAKE 1 TABLET BY MOUTH EVERY 8 HOURS AS NEEDED FOR NAUSEA FOR VOMITING 07/27/20   Yes Cammie Sickle, MD  zolpidem (AMBIEN CR) 6.25 MG CR tablet Take 1 tablet (6.25 mg total) by mouth at bedtime as needed. for sleep 03/24/21  Yes Cammie Sickle, MD  acyclovir (ZOVIRAX) 400 MG tablet Take 1 tablet by mouth once daily 03/14/21   Cammie Sickle, MD  naproxen (NAPROSYN) 500 MG tablet Take 500 mg by mouth 2 (two) times daily as needed. Patient not taking: Reported on 03/24/2021 11/18/20   [provider]  prochlorperazine (COMPAZINE) 10 MG tablet Take 1 tablet (10 mg total) by mouth every 6 (six) hours as needed for nausea or vomiting. 02/18/20   Cammie Sickle, MD    Physical Exam Vitals:  Vitals:   03/27/21 1429  BP: (!) 138/92   No LMP recorded. Patient is postmenopausal.  General: NAD HEENT: normocephalic, anicteric Pulmonary: No increased work of breathing Cardiovascular: RRR, distal pulses 2+ Abdomen: soft, non-tender, non-distended.  Umbilicus without lesions.  No hepatomegaly, splenomegaly or masses palpable. No evidence of hernia  Genitourinary:  External: Normal external female genitalia.  Normal urethral meatus, normal Bartholin's and Skene's glands.    Vagina: Normal vaginal mucosa, no evidence of prolapse.    Cervix: Grossly normal in appearance, no bleeding  Uterus: Non-enlarged, mobile, normal contour.  No CMT  Adnexa: ovaries non-enlarged, no adnexal masses  Rectal: deferred  Lymphatic: no evidence of inguinal lymphadenopathy Extremities: no edema, erythema, or tenderness Neurologic: Grossly intact Psychiatric: mood appropriate, affect full  Female chaperone present for pelvic and breast  portions of the physical exam  CT SOFT TISSUE NECK W CONTRAST  Result Date: 03/23/2021 CLINICAL DATA:  Hematologic malignancy. Surveillance. Follicular lymphoma grade 2. Lymph nodes of multiple sites. EXAM: CT NECK WITH CONTRAST TECHNIQUE: Multidetector CT imaging of the neck was performed using the standard protocol following the  bolus administration of intravenous contrast. CONTRAST:  134m OMNIPAQUE IOHEXOL 300 MG/ML  SOLN COMPARISON:  01/06/2020 FINDINGS: Pharynx and larynx: No mucosal or submucosal lesion. Salivary glands: Parotid and submandibular glands are normal. Thyroid: Normal Lymph nodes: Resolution of previously seen bilateral lymphadenopathy. There are no residual or recurrent enlarged nodes. Vascular: Normal Limited intracranial: Normal Visualized orbits: Normal Mastoids and visualized paranasal sinuses: Chronic opacification of the right division of the sphenoid sinus. Other visualized sinuses are clear. Mastoids are clear. Skeleton: Mild midcervical spondylosis. Upper chest: See results of chest CT. Other: None IMPRESSION: The neck CT is now normal. Complete resolution of previously seen lymphadenopathy. Electronically Signed   By: MNelson ChimesM.D.   On: 03/23/2021 11:49   CT CHEST ABDOMEN PELVIS W CONTRAST  Result Date: 03/23/2021 CLINICAL DATA:  Follicular lymphoma grade ii, lymph nodes of multiple sites Hematologic malignancy, surveillance EXAM: CT CHEST, ABDOMEN, AND PELVIS WITH CONTRAST TECHNIQUE: Multidetector CT imaging of the chest, abdomen and pelvis was performed following the standard protocol during bolus administration of intravenous contrast. CONTRAST:  1081mOMNIPAQUE IOHEXOL 300 MG/ML  SOLN COMPARISON:  Multiple priors including most recent CT February 06, 2021. FINDINGS: CT CHEST FINDINGS Cardiovascular: Left chest wall Port-A-Cath with 2 at the superior cavoatrial junction. No significant vascular findings. Normal heart size. No pericardial effusion. Mediastinum/Nodes: No enlarged mediastinal, hilar, or axillary lymph nodes. Thyroid gland, trachea, and esophagus demonstrate no significant findings. Lungs/Pleura: No suspicious pulmonary nodules or masses. No pleural effusion. No pneumothorax. Musculoskeletal: No aggressive lytic or blastic lesion of bone. CT ABDOMEN PELVIS FINDINGS Hepatobiliary: Diffuse  hepatic steatosis. No suspicious hepatic lesion. Gallbladder surgically absent. No suspicious biliary ductal dilation. Pancreas: Within normal limits. Spleen: Normal in size without focal abnormality. Adrenals/Urinary Tract: Adrenal glands are unremarkable. 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 Otherwise the kidneys are unremarkable, without renal calculi, suspicious enhancing lesion, or hydronephrosis. Bladder is unremarkable. Stomach/Bowel: Stomach is within normal limits. Colonic diverticulosis findings acute diverticulitis. No evidence of bowel wall thickening, distention, or inflammatory changes. Vascular/Lymphatic: No abdominal aortic aneurysm. Decrease in hazy appearance of the small into a with stable prominent mesenteric lymph nodes measuring up to 5 mm. Small left iliac node is unchanged in size measuring 6 mm on image 83/3. Reproductive: Simple appearing 2.6 cm right ovarian cyst on image 103/3. No follow-up imaging recommended. Note: This recommendation does not apply to premenarchal patients and to those with increased risk (genetic, family history, elevated tumor markers or other high-risk factors) of ovarian cancer. Reference: JACR 2020 Feb; 17(2):248-254 Other: No abdominopelvic ascites. Musculoskeletal: Mild degenerative changes of the spine. No aggressive lytic or blastic lesions of bone. IMPRESSION: 1. Stable examination, without new or progressive adenopathy within the chest, abdomen, or pelvis. 2. Diffuse hepatic steatosis. 3. Colonic diverticulosis without findings acute diverticulitis. Electronically Signed   By: Dahlia Bailiff MD   On: 03/23/2021 12:57    Assessment: 65 y.o. G10P0 presenting for right adnexal cyst  Plan: Problem List Items Addressed This Visit   None Visit Diagnoses     Screening for malignant neoplasm of cervix    -  Primary   Relevant Orders   MM 3D SCREEN BREAST BILATERAL   Breast cancer screening by mammogram       Relevant Orders   MM 3D  SCREEN BREAST BILATERAL   Right ovarian cyst       Relevant Orders   US Pelvis Complete   US PELVIS TRANSVAGINAL NON-OB (TV ONLY)      1) POSTMENOPAUSAL The incidence and implication of adnexal masses and ovarian cysts were discussed with the patient in detail.  Prior imaging if available was reviewed at today's visit.  While adnexal masses and cysts are a less common imaging finding in postmenopausal women as compared to premenopausal women, the vast majority of these lesions are still benign.  Follow up imaging to determine stability in size and appearance is reasonable in order to provide additional reassurance.  In some cases symptoms, family history, or indeterminate or concerning findings may warrant surgical evaluation and referral to a Gynecology-Oncologist., or serum tumor markers.    1. Cysts ?1 cm: Are clinically inconsequential; at the discretion of the interpreting physician whether or not to describe them in the imaging report; do not need follow-up. 2. Cysts >1 and ?7 cm: Should be described in the imaging report with statement that they are almost certainly benign; yearly follow-up, at least initially, with [place] recommended. Some practices may opt to increase the lower size threshold for follow-up from 1 cm to as high as 3 cm. One may opt to continue follow-up  annually or to decrease the frequency of follow-up once stability or decrease in size has been confirmed. Cysts in the larger end of this range should still generally be followed on a regular basis. 3. Cysts >7 cm: Since these may be difficult to assess completely with [place], further imaging with MR or surgical evaluation should be considered.  Gordy Levan et al. Management of Asymptomatic Ovarian and Other Adnexal Cysts Imaged at [place]: Society of Radiologists in [place] Statement 2010. Radiology 256 (Sept 2010): 943-954.).     - Given her history of Lymphoma will order follow up ultrasound at present no tumor  markers  2) Health care maintenance - mammogram ordered, pap obtained  3) Return in about 8 weeks (around 05/22/2021) for Follow up post ultrasound.   Malachy Mood, MD, [facility], [facility] 03/27/2021, 2:40 PM

## 2021-03-27 NOTE — Patient Instructions (Signed)
[facility] [address] [place] [address]  [facility]  [address] [place] [address]  Phone: [phone]  

## 2021-04-05 ENCOUNTER — Other Ambulatory Visit: Payer: Self-pay | Admitting: Family Medicine

## 2021-04-05 ENCOUNTER — Ambulatory Visit
Admission: RE | Admit: 2021-04-05 | Discharge: 2021-04-05 | Disposition: A | Discharge status: Home / Self Care | Payer: 59 | Source: Ambulatory Visit | Attending: Family Medicine | Admitting: Family Medicine

## 2021-04-05 ENCOUNTER — Other Ambulatory Visit: Payer: Self-pay | Admitting: Physical Medicine and Rehabilitation

## 2021-04-05 ENCOUNTER — Other Ambulatory Visit: Payer: Self-pay

## 2021-04-05 DIAGNOSIS — Y92009 Unspecified place in unspecified non-institutional (private) residence as the place of occurrence of the external cause: Secondary | ICD-10-CM | POA: Insufficient documentation

## 2021-04-05 DIAGNOSIS — R42 Dizziness and giddiness: Secondary | ICD-10-CM | POA: Insufficient documentation

## 2021-04-05 DIAGNOSIS — W19XXXA Unspecified fall, initial encounter: Secondary | ICD-10-CM

## 2021-04-05 DIAGNOSIS — S0990XA Unspecified injury of head, initial encounter: Secondary | ICD-10-CM | POA: Diagnosis not present

## 2021-04-05 DIAGNOSIS — I6782 Cerebral ischemia: Secondary | ICD-10-CM | POA: Diagnosis not present

## 2021-04-05 IMAGING — CT CT HEAD W/O CM
3 series · 16 of 47 positions shown, 19 images · non-contrast
Comparison: None.

CLINICAL DATA: Fall at home, initial encounter W19.XXXA, [AGE]
([J0]-CM)

Dizziness R42 ([J0]-CM)
EXAM:
CT HEAD WITHOUT CONTRAST
TECHNIQUE: Contiguous axial images were obtained from the base of the skull
through the vertex without intravenous contrast.

[Series 3: head wo · axial · 0.44mm/px · z∈[-120,+5]mm · 10 of 30 slices shown, 13 images]
[im 3/30  brain]
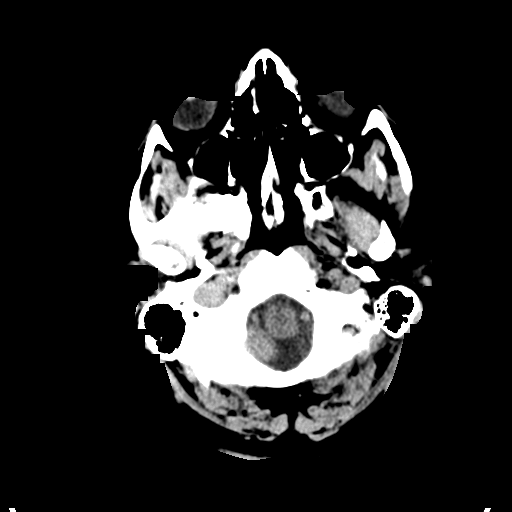
[im 3/30  bone]
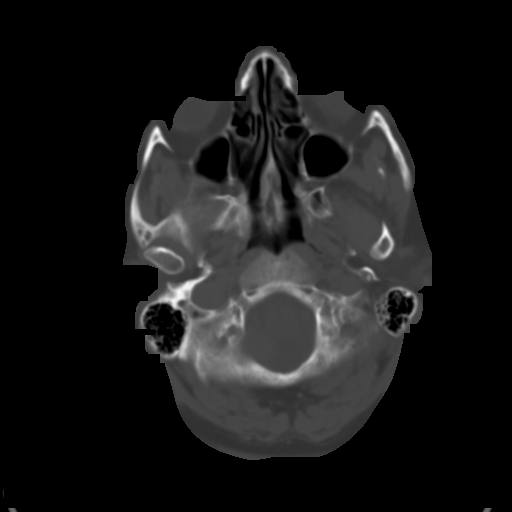
[im 6/30  brain]
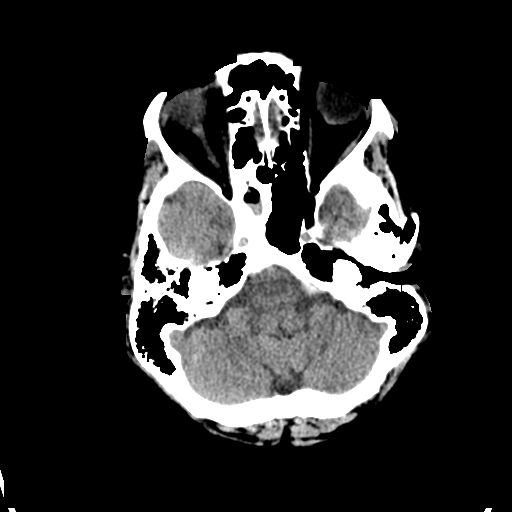
[im 9/30  brain]
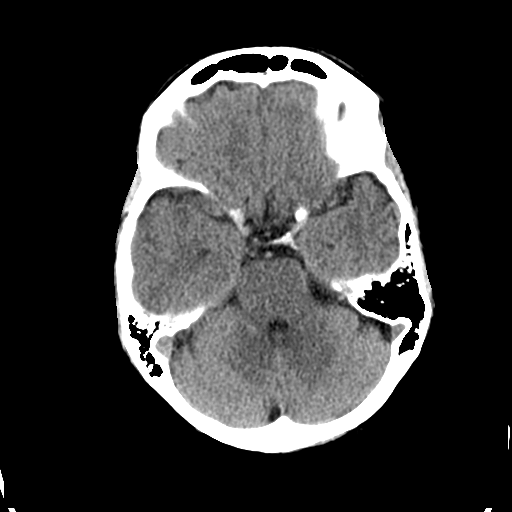
[im 11/30  brain]
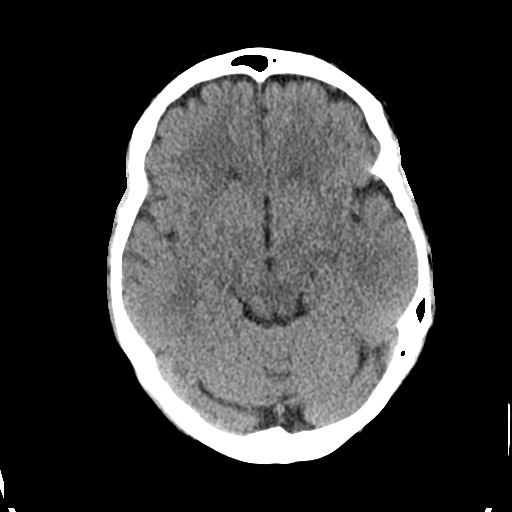
[im 14/30  brain]
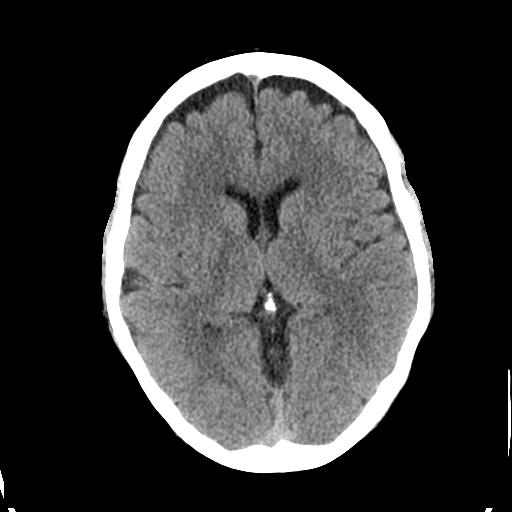
[im 14/30  bone]
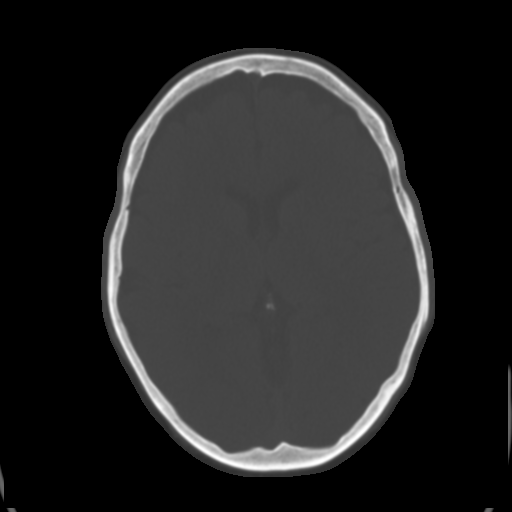
[im 17/30  brain]
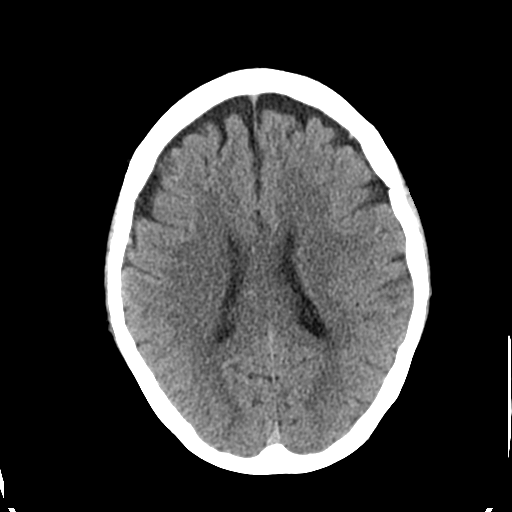
[im 20/30  brain]
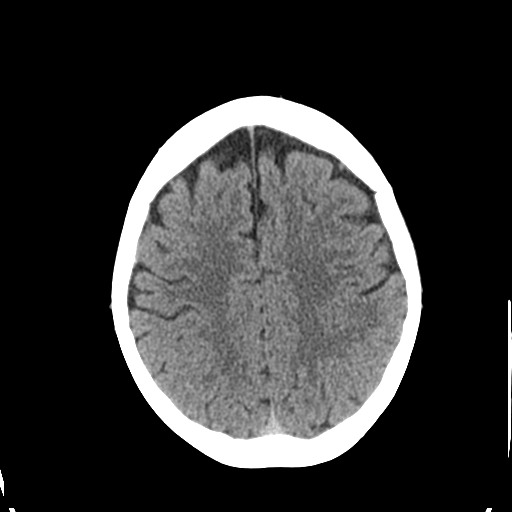
[im 23/30  brain]
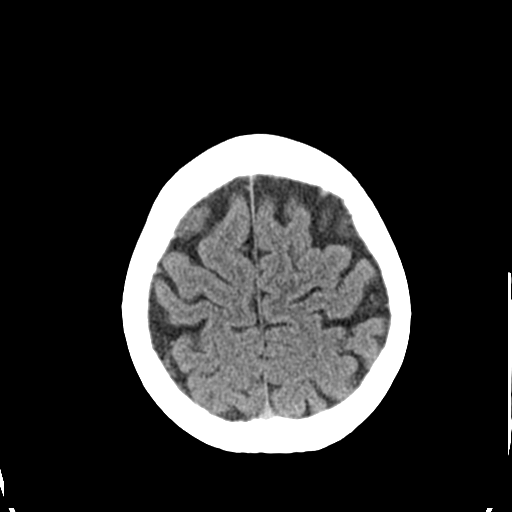
[im 25/30  brain]
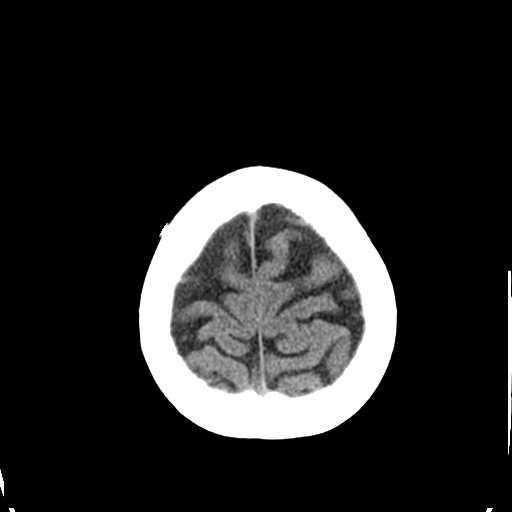
[im 25/30  bone]
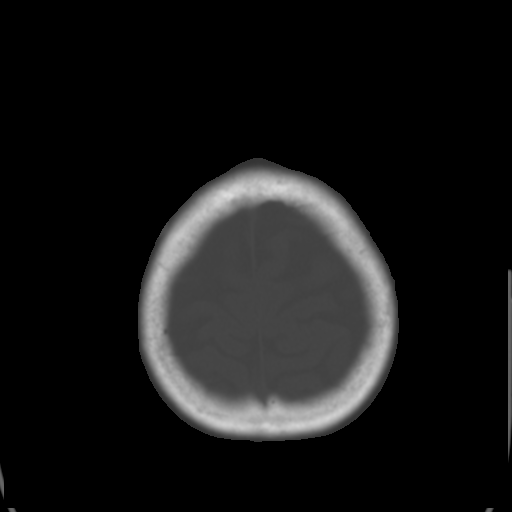
[im 28/30  brain]
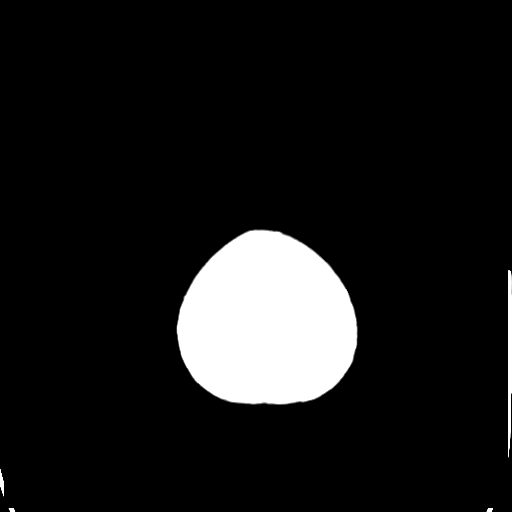

[Series 4: coronal soft tissue · coronal · 0.31mm/px · 3 of 65 slices shown]
[im 22/65  brain]
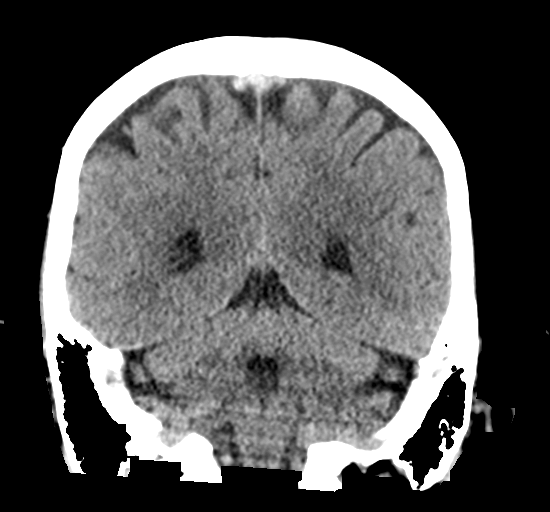
[im 29/65  brain]
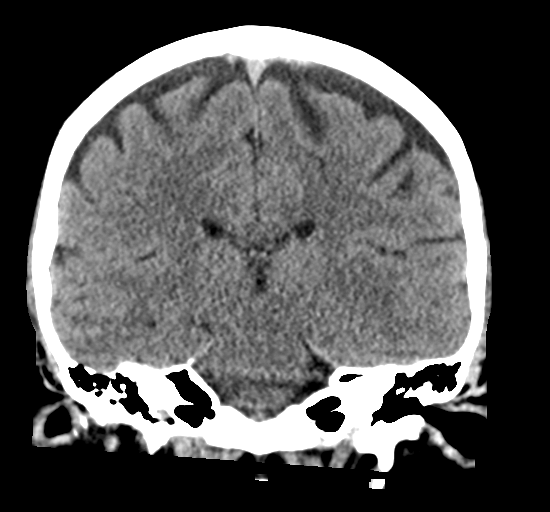
[im 36/65  brain]
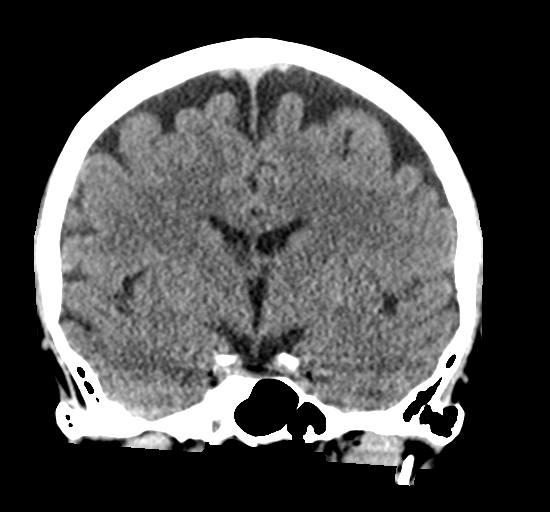

[Series 5: sagittal soft tissue · sagittal · 0.31mm/px · 3 of 58 slices shown]
[im 20/58  brain]
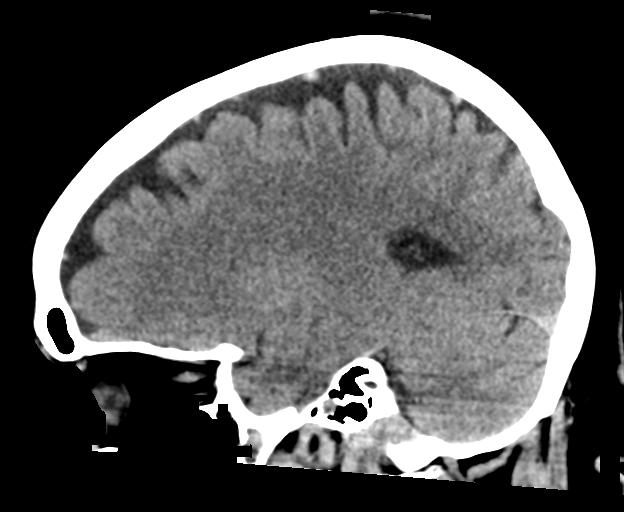
[im 29/58  brain]
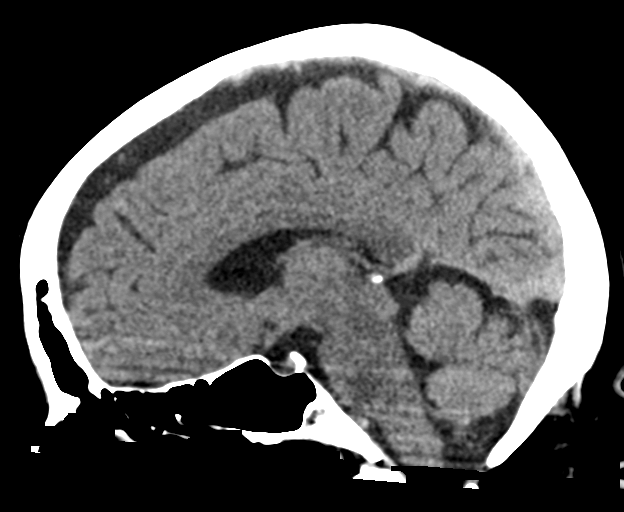
[im 39/58  brain]
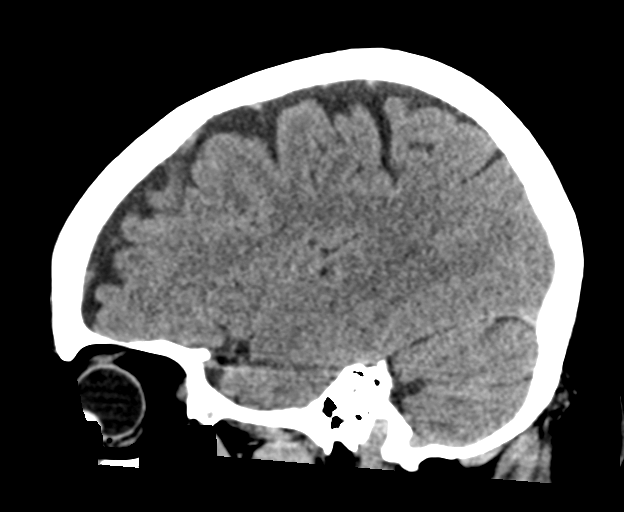

[16 of 47 positions shown; findings below may reference images not displayed]

FINDINGS: Brain: No evidence of acute infarction, hemorrhage, hydrocephalus,
extra-axial collection or mass lesion/mass effect. Mild patchy white
matter hypoattenuation, nonspecific but likely related chronic
microvascular ischemic disease. Mild atrophy.

Vascular: No hyperdense vessel identified.

Skull: Nonspecific partially calcified high right scalp soft tissue
lesion.

Sinuses/Orbits: Near complete opacification of the right sphenoid
sinus with bony wall thickening. Mild ethmoid air cell mucosal
thickening.

Other: No mastoid effusions.
IMPRESSION: 1. No evidence acute intracranial abnormality.
2. Mild atrophy and chronic microvascular ischemic disease.
3. Near complete right sphenoid sinus opacification with bony wall
thickening, suggestive of chronic sinusitis.

## 2021-04-13 ENCOUNTER — Ambulatory Visit
Admission: RE | Admit: 2021-04-13 | Discharge: 2021-04-13 | Disposition: A | Discharge status: Home / Self Care | Payer: 59 | Source: Ambulatory Visit | Attending: Obstetrics and Gynecology | Admitting: Obstetrics and Gynecology

## 2021-04-13 ENCOUNTER — Other Ambulatory Visit: Payer: Self-pay

## 2021-04-13 DIAGNOSIS — Z1231 Encounter for screening mammogram for malignant neoplasm of breast: Secondary | ICD-10-CM | POA: Diagnosis not present

## 2021-04-13 DIAGNOSIS — Z124 Encounter for screening for malignant neoplasm of cervix: Secondary | ICD-10-CM | POA: Diagnosis present

## 2021-04-13 IMAGING — MG MM DIGITAL SCREENING BILAT W/ TOMO AND CAD
6 of 10 series · 6 of 30 positions shown · non-contrast
Comparison: None.

CLINICAL DATA: Screening.

EXAM:
DIGITAL SCREENING BILATERAL MAMMOGRAM WITH TOMOSYNTHESIS AND CAD
TECHNIQUE: Bilateral screening digital craniocaudal and mediolateral oblique
mammograms were obtained. Bilateral screening digital breast
tomosynthesis was performed. The images were evaluated with
computer-aided detection.

[R MLO synth-2D]
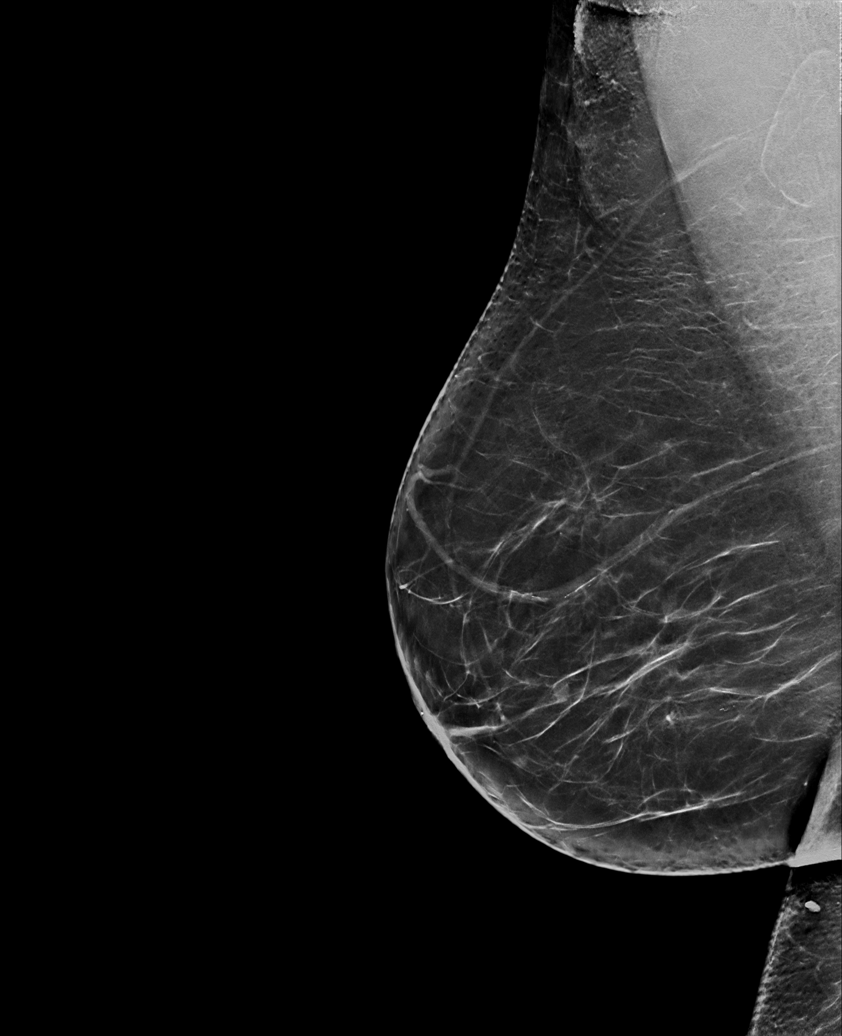

[R CC synth-2D]
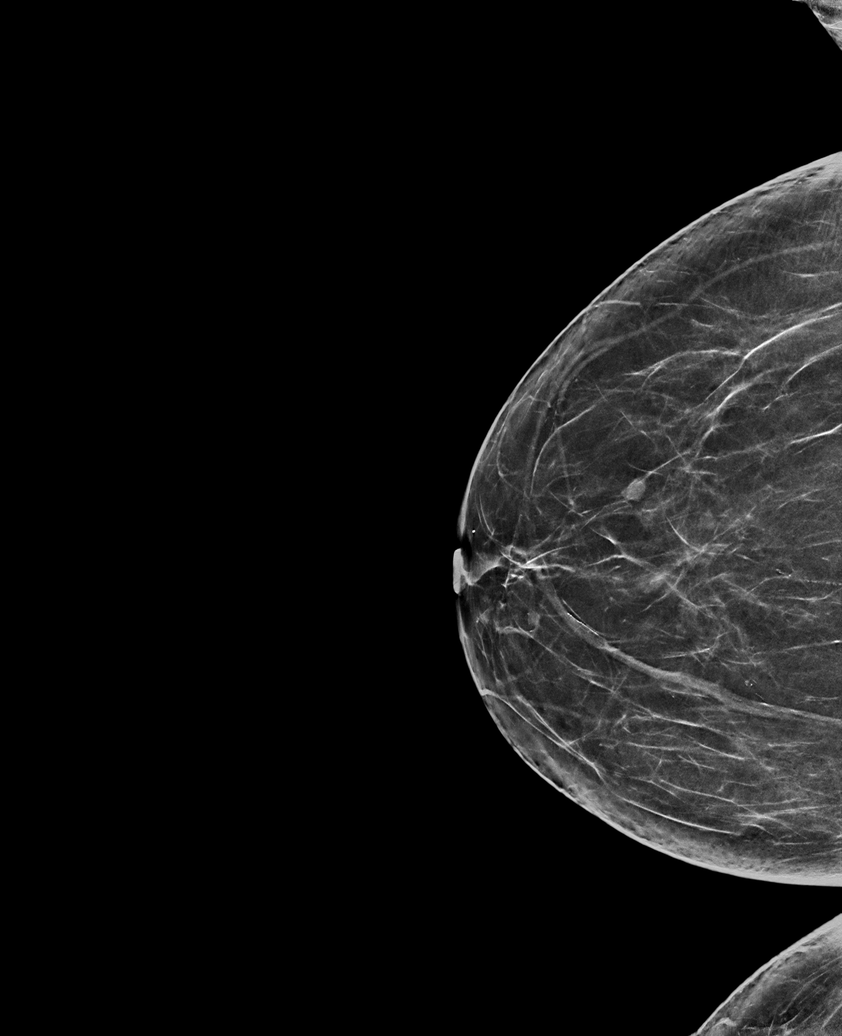

[L CC synth-2D]
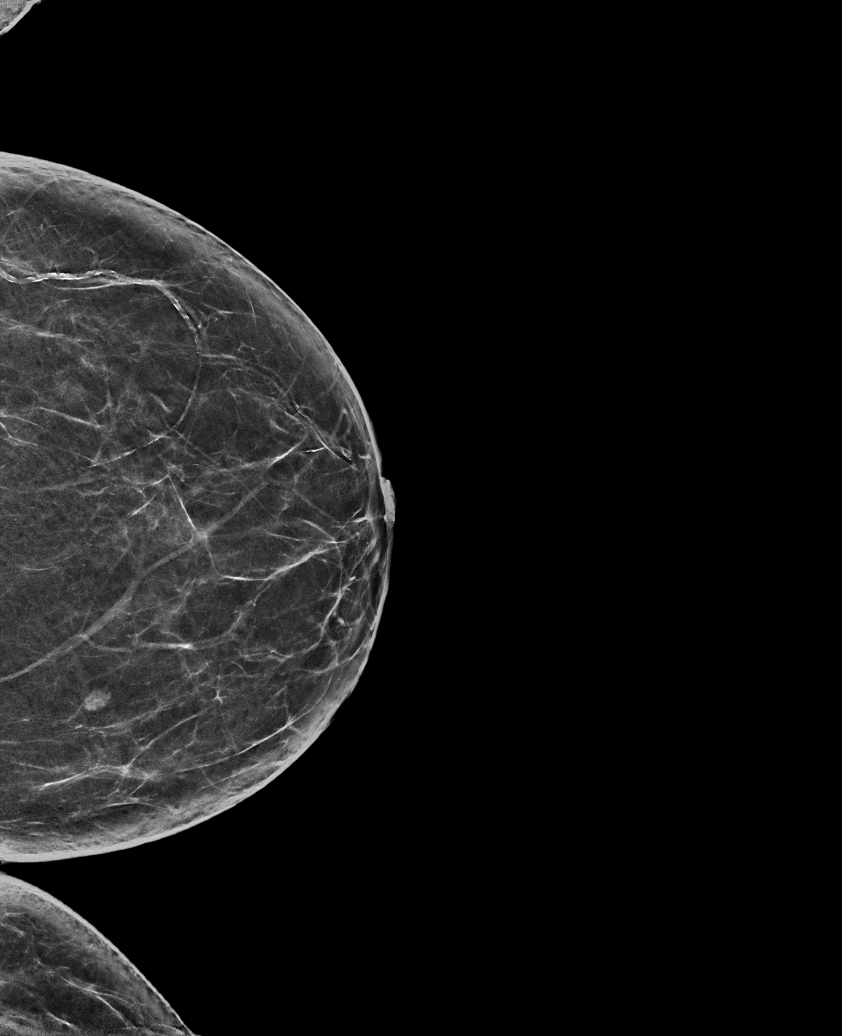

[L MLO synth-2D (1 of 2)]
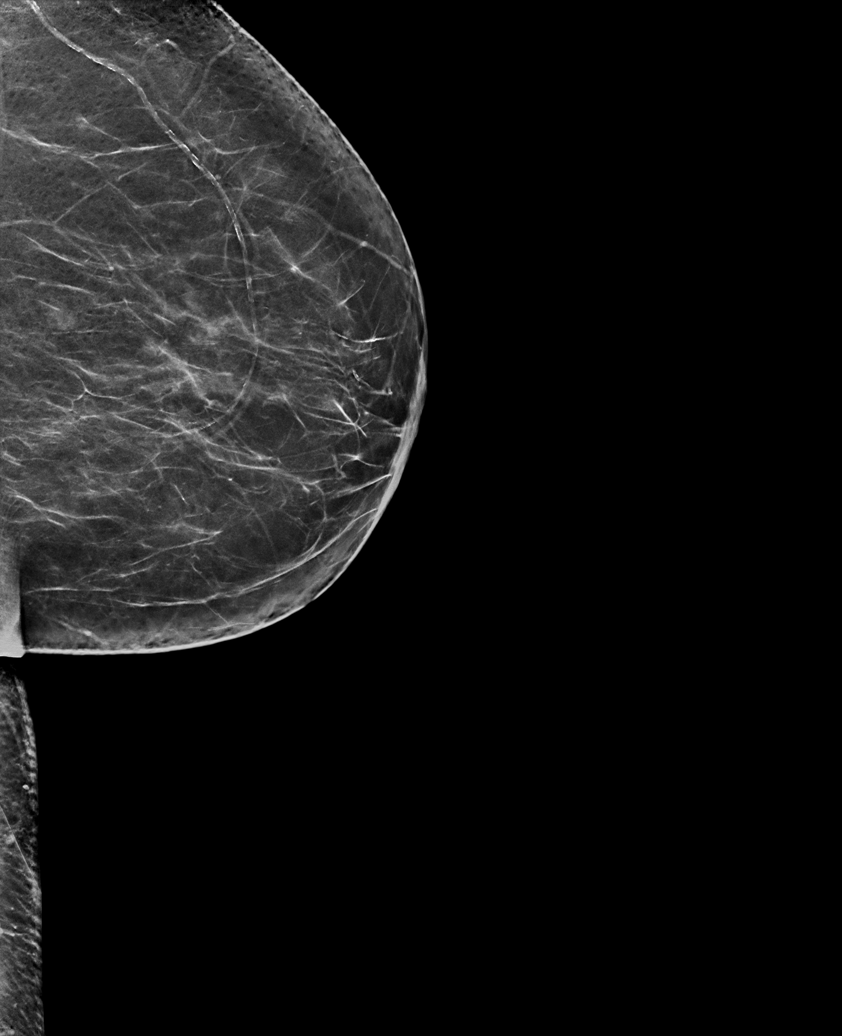

[L MLO synth-2D (2 of 2)]
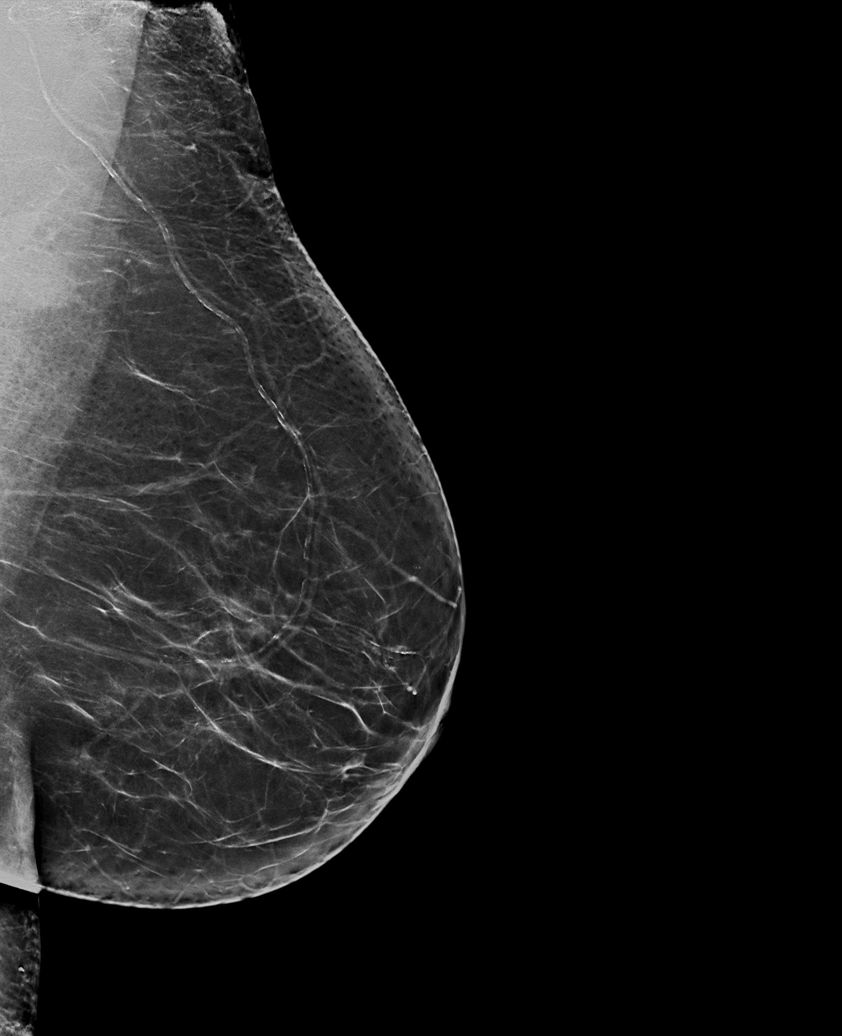

[L MLO tomo · tomo slice 41/80.0]
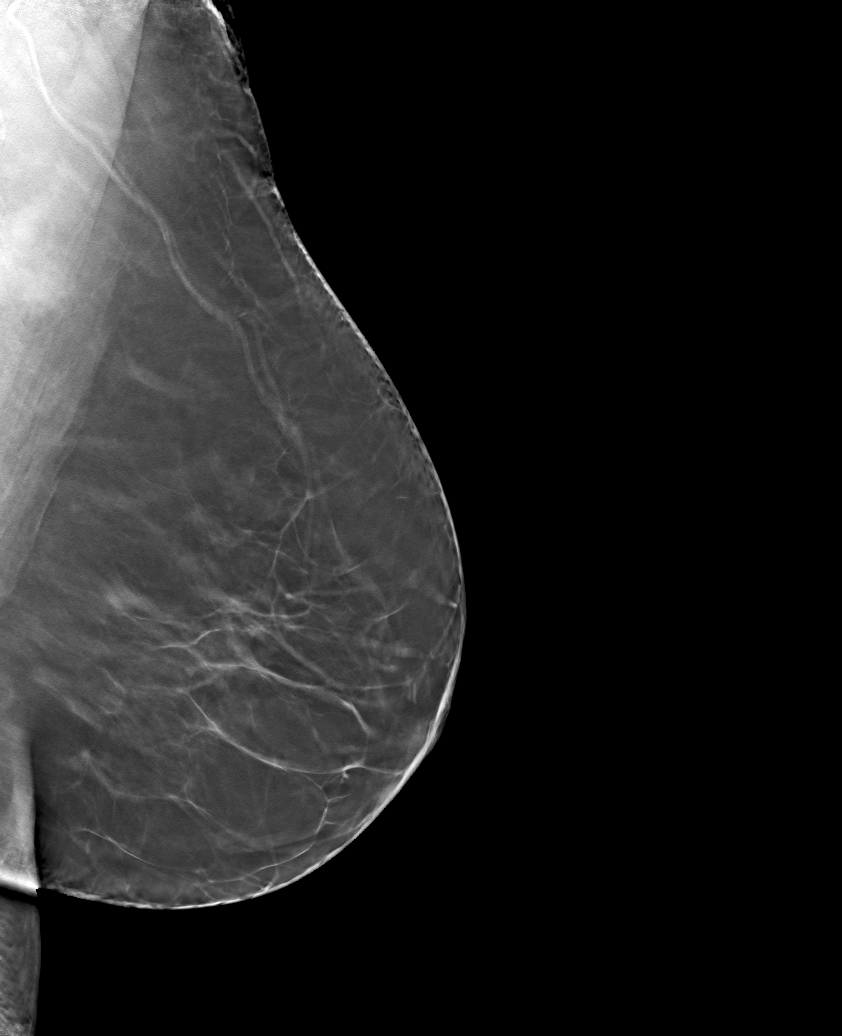

[6 of 30 positions shown; findings below may reference images not displayed]

ACR Breast Density Category b: There are scattered areas of
fibroglandular density.
FINDINGS: In the right breast, a possible mass warrants further evaluation. In
the left breast, a possible mass and a possible asymmetry warrant
further evaluation.
IMPRESSION: Further evaluation is suggested for a possible mass in the right
breast.

Further evaluation is recommended for a possible mass and a possible
asymmetry in the left breast.

RECOMMENDATION:
Diagnostic mammogram and possibly ultrasound of the right and left
breasts. (Code:[X3])

The patient will be contacted regarding the findings, and additional
imaging will be scheduled.

BI-RADS CATEGORY  0: Incomplete. Need additional imaging evaluation
and/or prior mammograms for comparison.

## 2021-04-17 ENCOUNTER — Other Ambulatory Visit: Payer: Self-pay | Admitting: Obstetrics and Gynecology

## 2021-04-17 DIAGNOSIS — R928 Other abnormal and inconclusive findings on diagnostic imaging of breast: Secondary | ICD-10-CM

## 2021-04-17 DIAGNOSIS — N6310 Unspecified lump in the right breast, unspecified quadrant: Secondary | ICD-10-CM

## 2021-04-17 DIAGNOSIS — N6489 Other specified disorders of breast: Secondary | ICD-10-CM

## 2021-04-17 DIAGNOSIS — N6320 Unspecified lump in the left breast, unspecified quadrant: Secondary | ICD-10-CM

## 2021-04-24 ENCOUNTER — Ambulatory Visit
Admission: RE | Admit: 2021-04-24 | Discharge: 2021-04-24 | Disposition: A | Discharge status: Home / Self Care | Payer: 59 | Source: Ambulatory Visit | Attending: Obstetrics and Gynecology | Admitting: Obstetrics and Gynecology

## 2021-04-24 ENCOUNTER — Other Ambulatory Visit: Payer: Self-pay | Admitting: Obstetrics and Gynecology

## 2021-04-24 ENCOUNTER — Other Ambulatory Visit: Payer: Self-pay

## 2021-04-24 DIAGNOSIS — N6489 Other specified disorders of breast: Secondary | ICD-10-CM | POA: Diagnosis present

## 2021-04-24 DIAGNOSIS — R928 Other abnormal and inconclusive findings on diagnostic imaging of breast: Secondary | ICD-10-CM | POA: Diagnosis present

## 2021-04-24 DIAGNOSIS — N6310 Unspecified lump in the right breast, unspecified quadrant: Secondary | ICD-10-CM | POA: Diagnosis present

## 2021-04-24 DIAGNOSIS — N6320 Unspecified lump in the left breast, unspecified quadrant: Secondary | ICD-10-CM | POA: Diagnosis present

## 2021-04-24 IMAGING — US US BREAST*L* LIMITED INC AXILLA
1 series · 7 of 7 positions shown · non-contrast
Comparison: Previous exam(s).
COMPARISON: Previous exam(s).

Addendum:
CLINICAL DATA: Recall for bilateral masses and possible left breast
asymmetry.



[Series 1: us breast*left* limited inc axilla · 0.05mm/px · 7 of 7 slices shown]
[im 1/7]
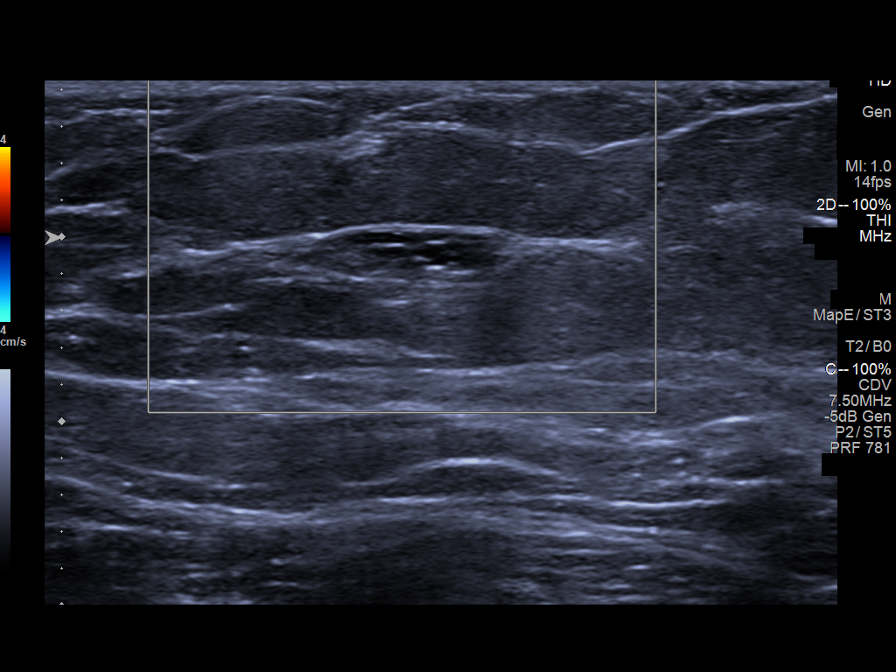
[im 2/7]
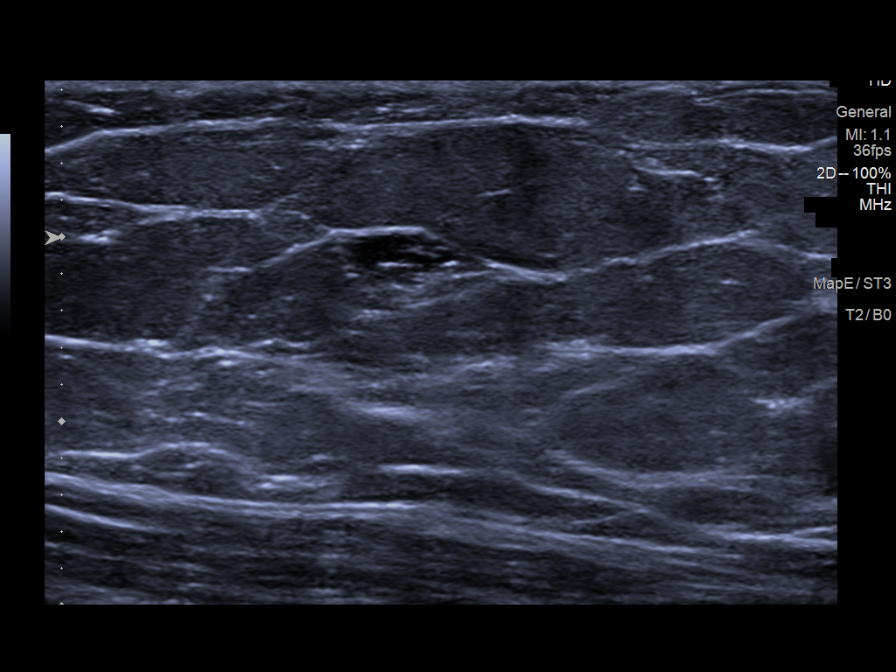
[im 3/7]
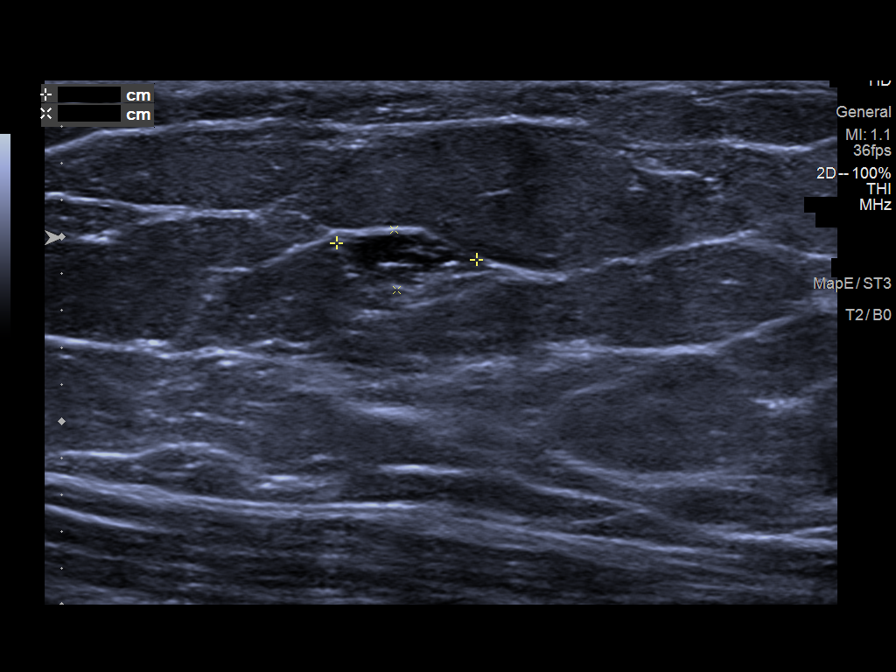
[im 4/7]
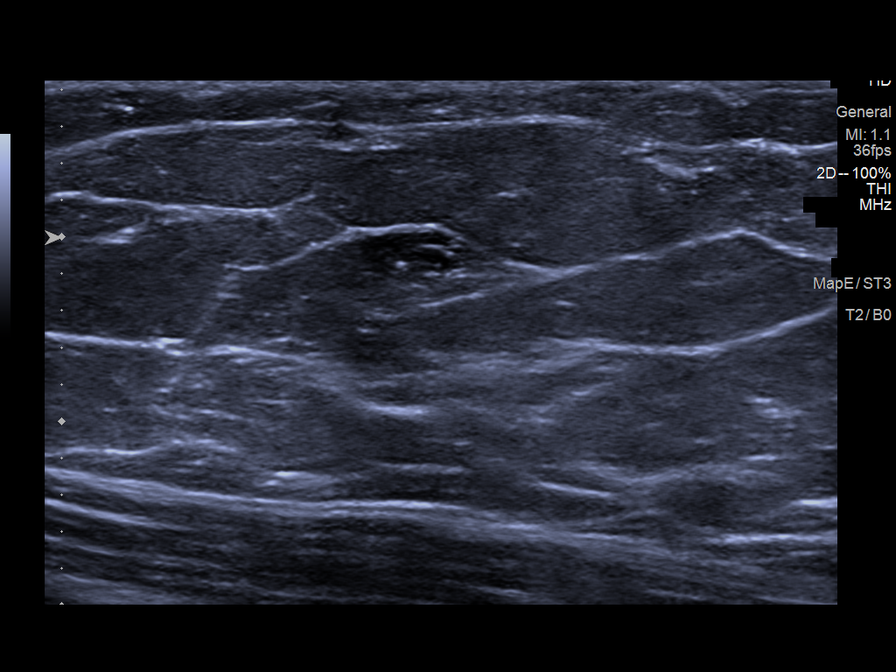
[im 5/7]
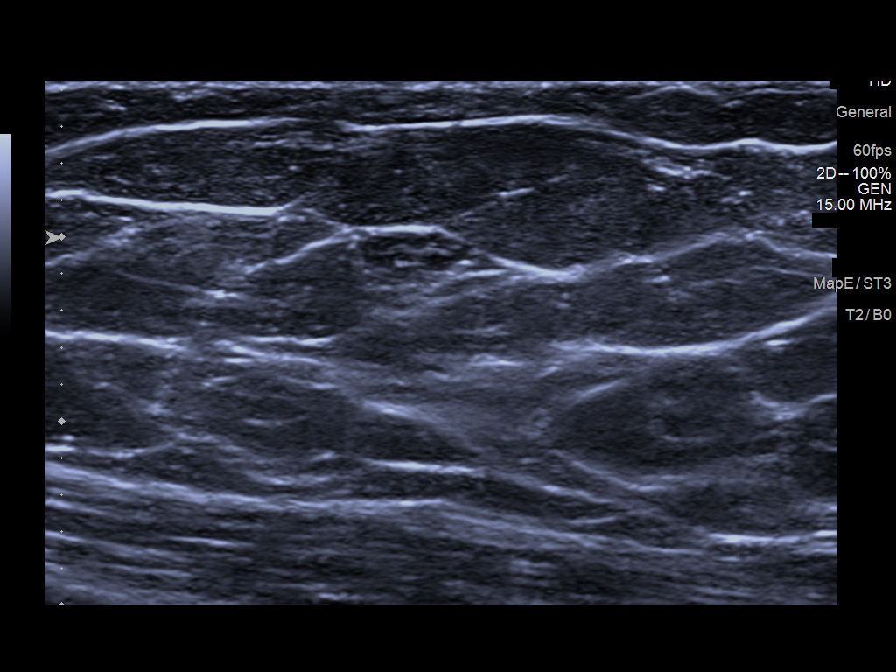
[im 6/7]
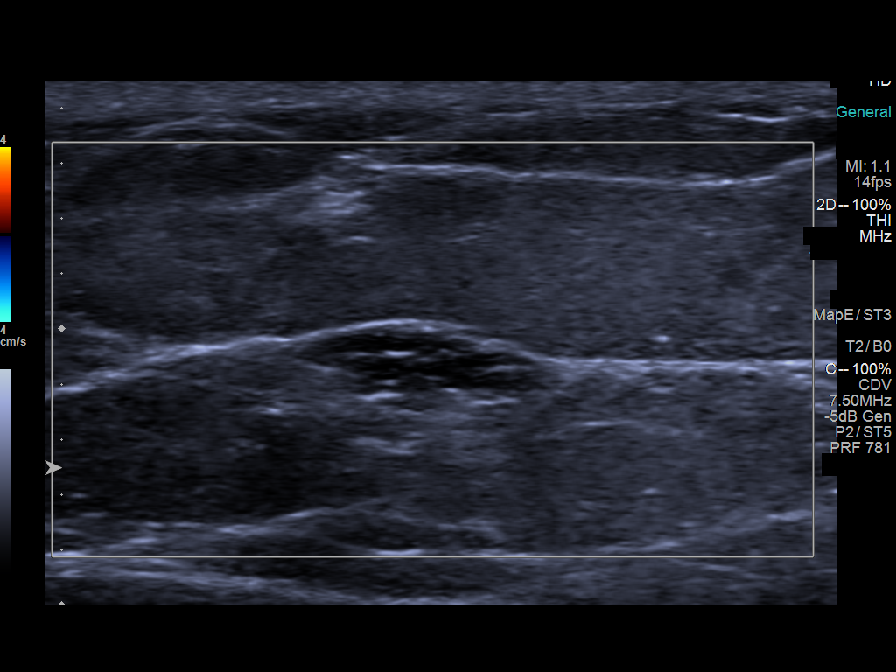
[im 7/7]
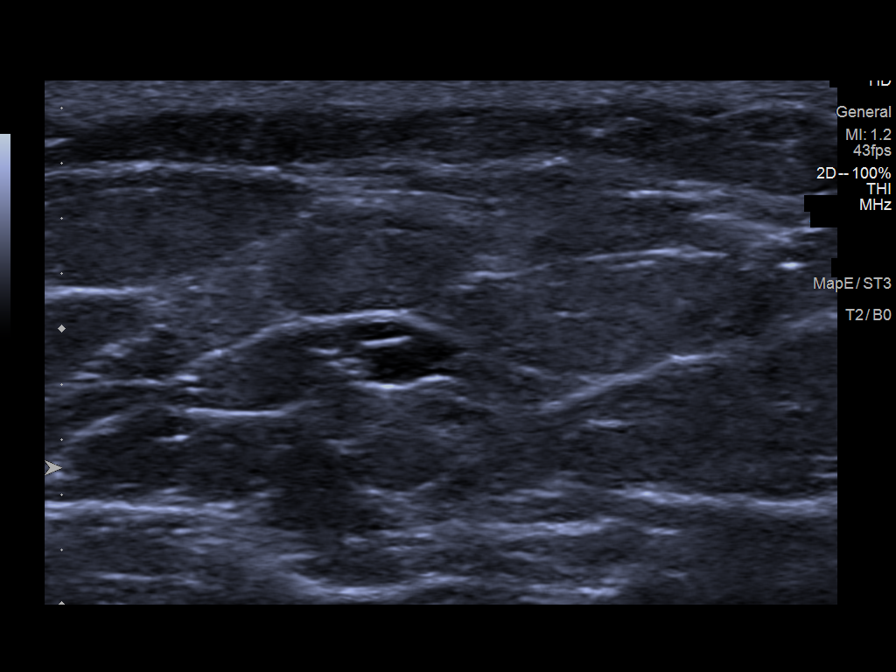

[7 of 7 positions shown; findings below may reference images not displayed]

ACR Breast Density Category b: There are scattered areas of
fibroglandular density.
FINDINGS: RIGHT breast: The previously noted mass in the right breast persists
on additional views as a 0.6 cm oval mass with indistinct margins in
the lower inner right breast at anterior depth.

Targeted ultrasound of the right breast at the 7 o'clock position 2
cm from the nipple demonstrates a 0.6 x 0.4 x 0.6 cm oval hypoechoic
mass with indistinct margins, corresponding to the mammographic
finding.

LEFT breast: A scar marker applied to the skin directly overlies the
area of previously noted possible asymmetry far posterior upper left
breast, denoting site of prior port central venous catheter. The
asymmetry seen on screening mammogram does not persist on additional
views, likely overlapping scar tissue.

The previously noted mass in the left breast persists on additional
views a 0.8 cm oval with microlobulated margins in the upper inner
left breast at middle to posterior depth.

Targeted ultrasound of the left breast at the 10 o'clock position 6
cm from the nipple demonstrates a 1.0 x 0.3 x 0.8 cm anechoic mass
with several thin septations, corresponding to the mammographic
finding and likely representing a cluster of cysts.
IMPRESSION: RIGHT breast:

Indeterminate 0.6 cm mass at the right breast 7 o'clock position.

LEFT breast:

Probable 1 cm benign cluster of cysts at the left breast 10 o'clock
position.

RECOMMENDATION:
1. Right breast ultrasound-guided biopsy. We will obtain an order
from the patient's physician and schedule her for the procedure at
her earliest convenience.
2. If the right breast biopsy is benign, recommend short-term
follow-up imaging of probable benign cluster of cysts with left
breast diagnostic mammogram and ultrasound in 6 months.

T

I have discussed the findings and recommendations with the patient.
If applicable, a reminder letter will be sent to the patient
regarding the next appointment.

BI-RADS CATEGORY  4: Suspicious.

ADDENDUM:
Targeted ultrasound of the right axilla was also performed and
demonstrated lymph nodes with normal morphology.

*** End of Addendum ***
ACR Breast Density Category b: There are scattered areas of
fibroglandular density.
FINDINGS: RIGHT breast: The previously noted mass in the right breast persists
on additional views as a 0.6 cm oval mass with indistinct margins in
the lower inner right breast at anterior depth.

Targeted ultrasound of the right breast at the 7 o'clock position 2
cm from the nipple demonstrates a 0.6 x 0.4 x 0.6 cm oval hypoechoic
mass with indistinct margins, corresponding to the mammographic
finding.

LEFT breast: A scar marker applied to the skin directly overlies the
area of previously noted possible asymmetry far posterior upper left
breast, denoting site of prior port central venous catheter. The
asymmetry seen on screening mammogram does not persist on additional
views, likely overlapping scar tissue.

The previously noted mass in the left breast persists on additional
views a 0.8 cm oval with microlobulated margins in the upper inner
left breast at middle to posterior depth.

Targeted ultrasound of the left breast at the 10 o'clock position 6
cm from the nipple demonstrates a 1.0 x 0.3 x 0.8 cm anechoic mass
with several thin septations, corresponding to the mammographic
finding and likely representing a cluster of cysts.
IMPRESSION: RIGHT breast:

Indeterminate 0.6 cm mass at the right breast 7 o'clock position.

LEFT breast:

Probable 1 cm benign cluster of cysts at the left breast 10 o'clock
position.

RECOMMENDATION:
1. Right breast ultrasound-guided biopsy. We will obtain an order
from the patient's physician and schedule her for the procedure at
her earliest convenience.
2. If the right breast biopsy is benign, recommend short-term
follow-up imaging of probable benign cluster of cysts with left
breast diagnostic mammogram and ultrasound in 6 months.

T

I have discussed the findings and recommendations with the patient.
If applicable, a reminder letter will be sent to the patient
regarding the next appointment.

BI-RADS CATEGORY  4: Suspicious.

## 2021-04-24 IMAGING — MG DIGITAL DIAGNOSTIC BILAT W/ TOMO W/ CAD
7 of 16 series · 7 of 40 positions shown · non-contrast
Comparison: Previous exam(s).
COMPARISON: Previous exam(s).

Addendum:
CLINICAL DATA: Recall for bilateral masses and possible left breast
asymmetry.



[R CC synth-2D]
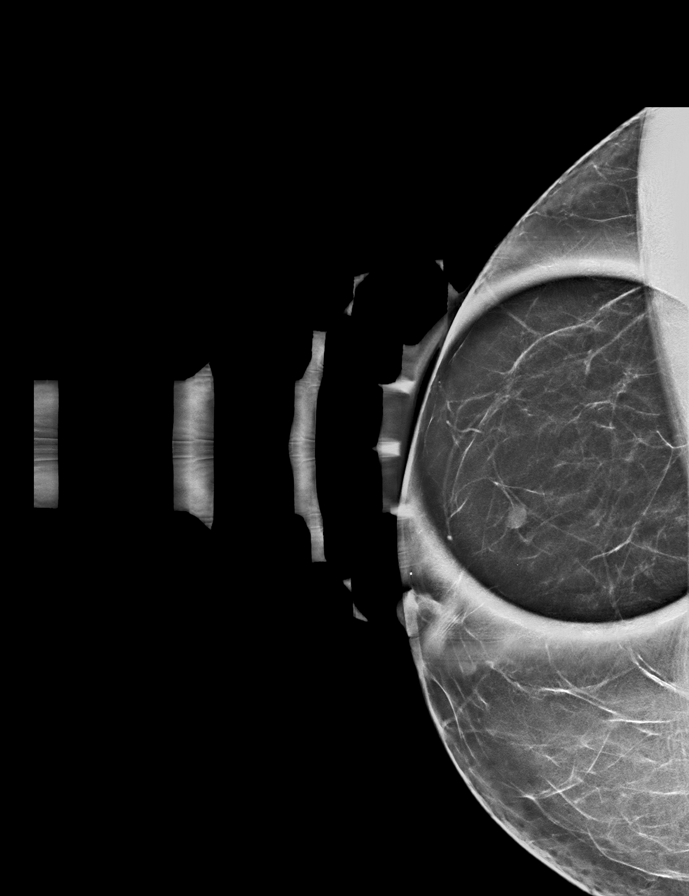

[R ML synth-2D]
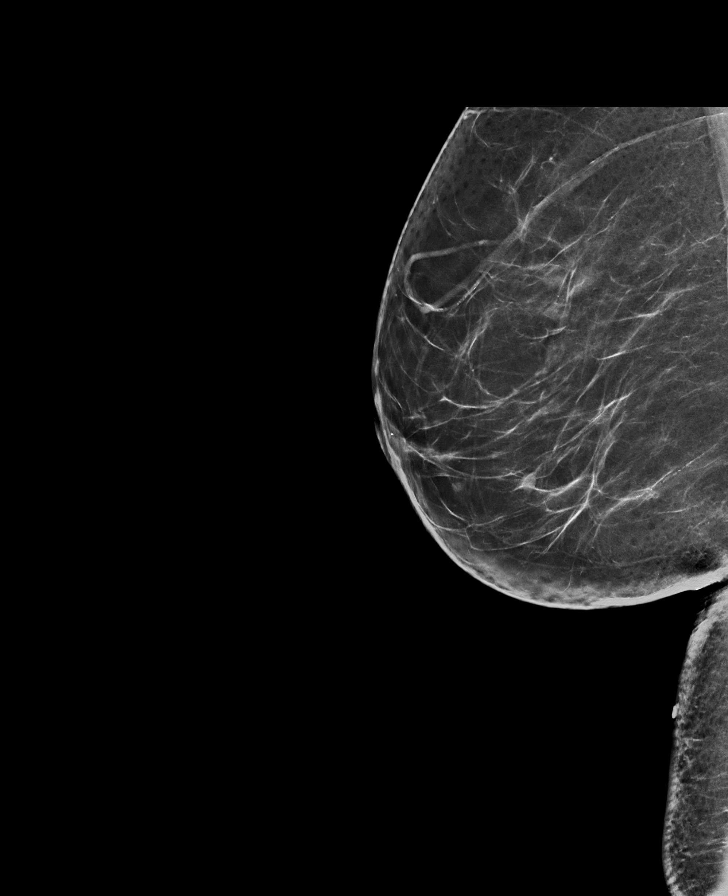

[L ML synth-2D]
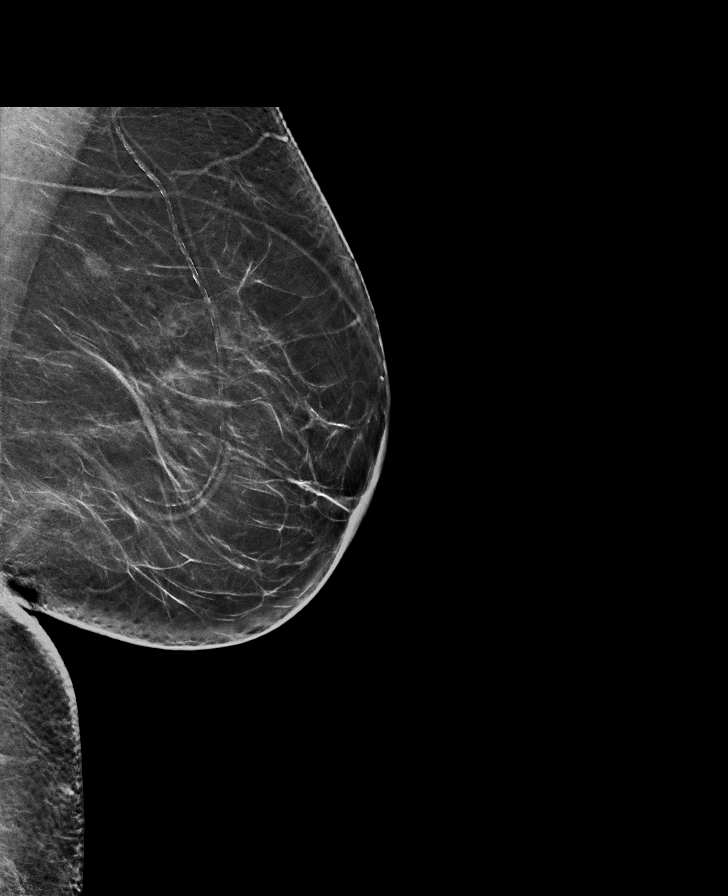

[L MLO synth-2D]
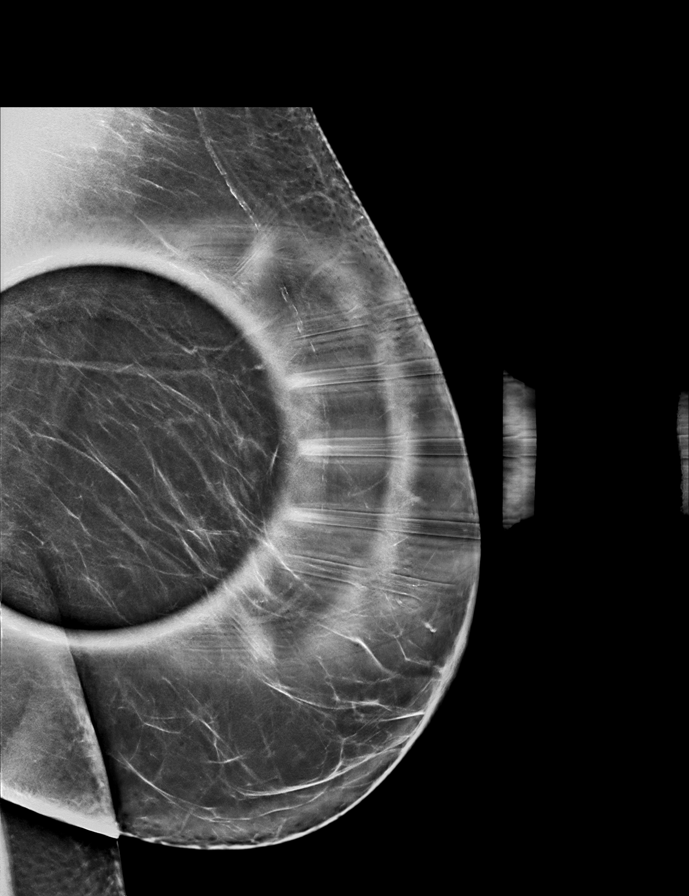

[R MLO synth-2D]
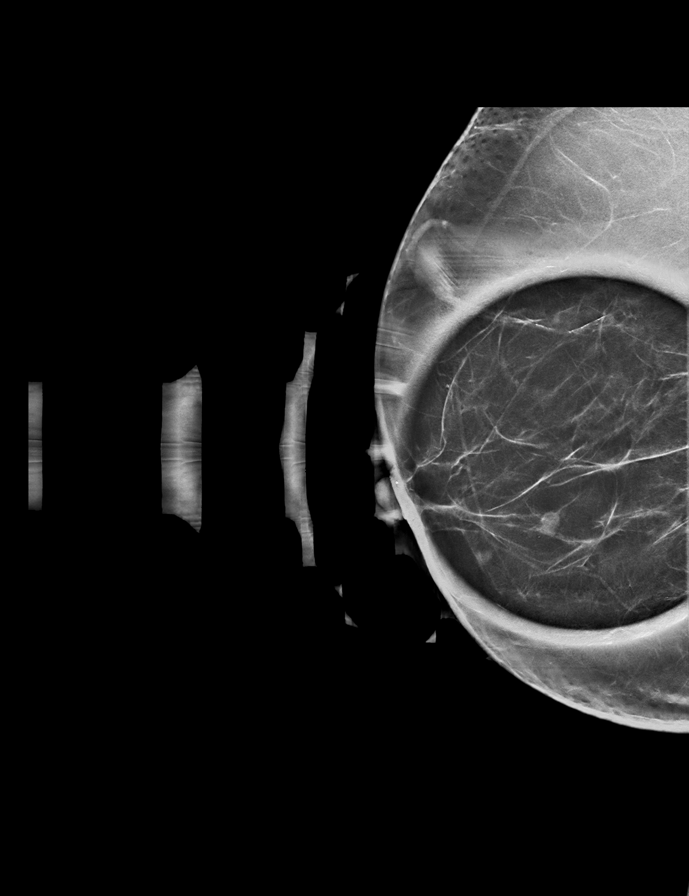

[L CC synth-2D]
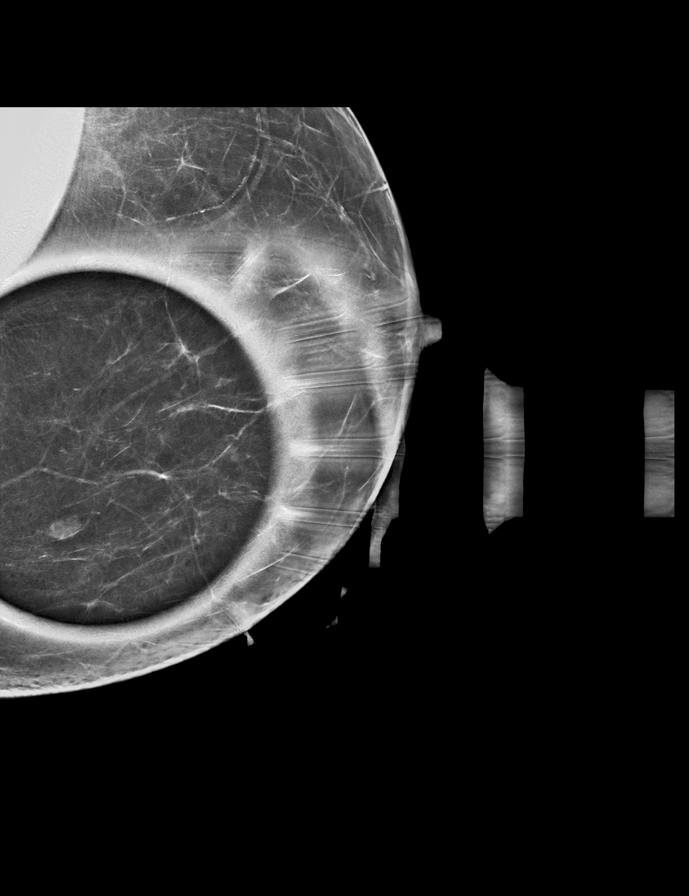

[L LM synth-2D]
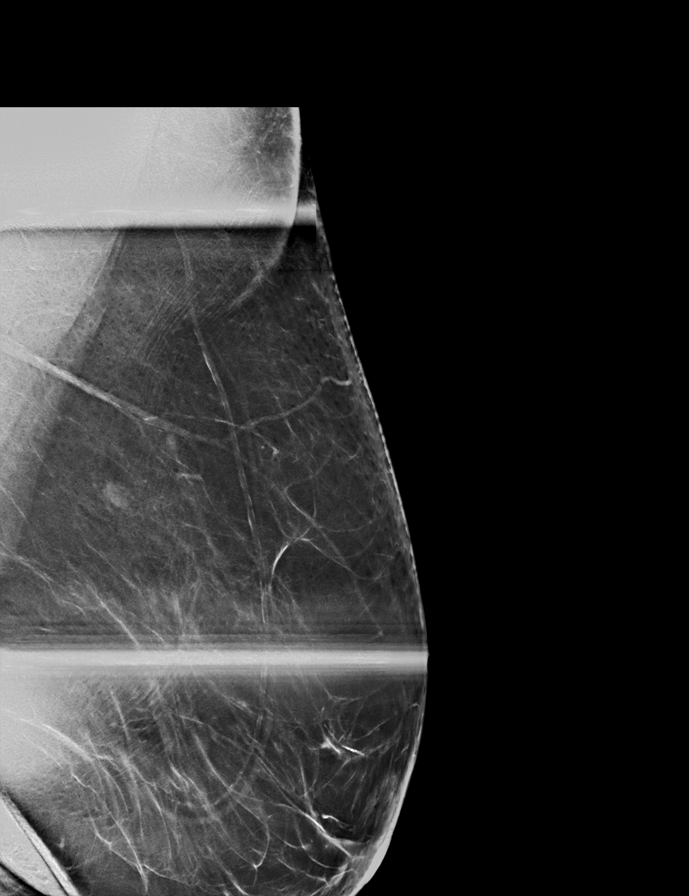

[7 of 40 positions shown; findings below may reference images not displayed]

ACR Breast Density Category b: There are scattered areas of
fibroglandular density.
FINDINGS: RIGHT breast: The previously noted mass in the right breast persists
on additional views as a 0.6 cm oval mass with indistinct margins in
the lower inner right breast at anterior depth.

Targeted ultrasound of the right breast at the 7 o'clock position 2
cm from the nipple demonstrates a 0.6 x 0.4 x 0.6 cm oval hypoechoic
mass with indistinct margins, corresponding to the mammographic
finding.

LEFT breast: A scar marker applied to the skin directly overlies the
area of previously noted possible asymmetry far posterior upper left
breast, denoting site of prior port central venous catheter. The
asymmetry seen on screening mammogram does not persist on additional
views, likely overlapping scar tissue.

The previously noted mass in the left breast persists on additional
views a 0.8 cm oval with microlobulated margins in the upper inner
left breast at middle to posterior depth.

Targeted ultrasound of the left breast at the 10 o'clock position 6
cm from the nipple demonstrates a 1.0 x 0.3 x 0.8 cm anechoic mass
with several thin septations, corresponding to the mammographic
finding and likely representing a cluster of cysts.
IMPRESSION: RIGHT breast:

Indeterminate 0.6 cm mass at the right breast 7 o'clock position.

LEFT breast:

Probable 1 cm benign cluster of cysts at the left breast 10 o'clock
position.

RECOMMENDATION:
1. Right breast ultrasound-guided biopsy. We will obtain an order
from the patient's physician and schedule her for the procedure at
her earliest convenience.
2. If the right breast biopsy is benign, recommend short-term
follow-up imaging of probable benign cluster of cysts with left
breast diagnostic mammogram and ultrasound in 6 months.

T

I have discussed the findings and recommendations with the patient.
If applicable, a reminder letter will be sent to the patient
regarding the next appointment.

BI-RADS CATEGORY  4: Suspicious.

ADDENDUM:
Targeted ultrasound of the right axilla was also performed and
demonstrated lymph nodes with normal morphology.

*** End of Addendum ***
ACR Breast Density Category b: There are scattered areas of
fibroglandular density.
FINDINGS: RIGHT breast: The previously noted mass in the right breast persists
on additional views as a 0.6 cm oval mass with indistinct margins in
the lower inner right breast at anterior depth.

Targeted ultrasound of the right breast at the 7 o'clock position 2
cm from the nipple demonstrates a 0.6 x 0.4 x 0.6 cm oval hypoechoic
mass with indistinct margins, corresponding to the mammographic
finding.

LEFT breast: A scar marker applied to the skin directly overlies the
area of previously noted possible asymmetry far posterior upper left
breast, denoting site of prior port central venous catheter. The
asymmetry seen on screening mammogram does not persist on additional
views, likely overlapping scar tissue.

The previously noted mass in the left breast persists on additional
views a 0.8 cm oval with microlobulated margins in the upper inner
left breast at middle to posterior depth.

Targeted ultrasound of the left breast at the 10 o'clock position 6
cm from the nipple demonstrates a 1.0 x 0.3 x 0.8 cm anechoic mass
with several thin septations, corresponding to the mammographic
finding and likely representing a cluster of cysts.
IMPRESSION: RIGHT breast:

Indeterminate 0.6 cm mass at the right breast 7 o'clock position.

LEFT breast:

Probable 1 cm benign cluster of cysts at the left breast 10 o'clock
position.

RECOMMENDATION:
1. Right breast ultrasound-guided biopsy. We will obtain an order
from the patient's physician and schedule her for the procedure at
her earliest convenience.
2. If the right breast biopsy is benign, recommend short-term
follow-up imaging of probable benign cluster of cysts with left
breast diagnostic mammogram and ultrasound in 6 months.

T

I have discussed the findings and recommendations with the patient.
If applicable, a reminder letter will be sent to the patient
regarding the next appointment.

BI-RADS CATEGORY  4: Suspicious.

## 2021-04-28 ENCOUNTER — Ambulatory Visit
Admission: RE | Admit: 2021-04-28 | Discharge: 2021-04-28 | Disposition: A | Discharge status: Home / Self Care | Payer: 59 | Source: Ambulatory Visit | Attending: Obstetrics and Gynecology | Admitting: Obstetrics and Gynecology

## 2021-04-28 ENCOUNTER — Other Ambulatory Visit: Payer: Self-pay

## 2021-04-28 DIAGNOSIS — N6310 Unspecified lump in the right breast, unspecified quadrant: Secondary | ICD-10-CM

## 2021-04-28 DIAGNOSIS — R928 Other abnormal and inconclusive findings on diagnostic imaging of breast: Secondary | ICD-10-CM

## 2021-04-28 HISTORY — PX: BREAST BIOPSY: SHX20

## 2021-04-28 IMAGING — MG MM BREAST LOCALIZATION CLIP
4 series · 4 of 12 positions shown · non-contrast
Comparison: Previous exam(s).

CLINICAL DATA: Postprocedure mammogram

EXAM:
3D DIAGNOSTIC RIGHT MAMMOGRAM POST ULTRASOUND BIOPSY

[R ML synth-2D]
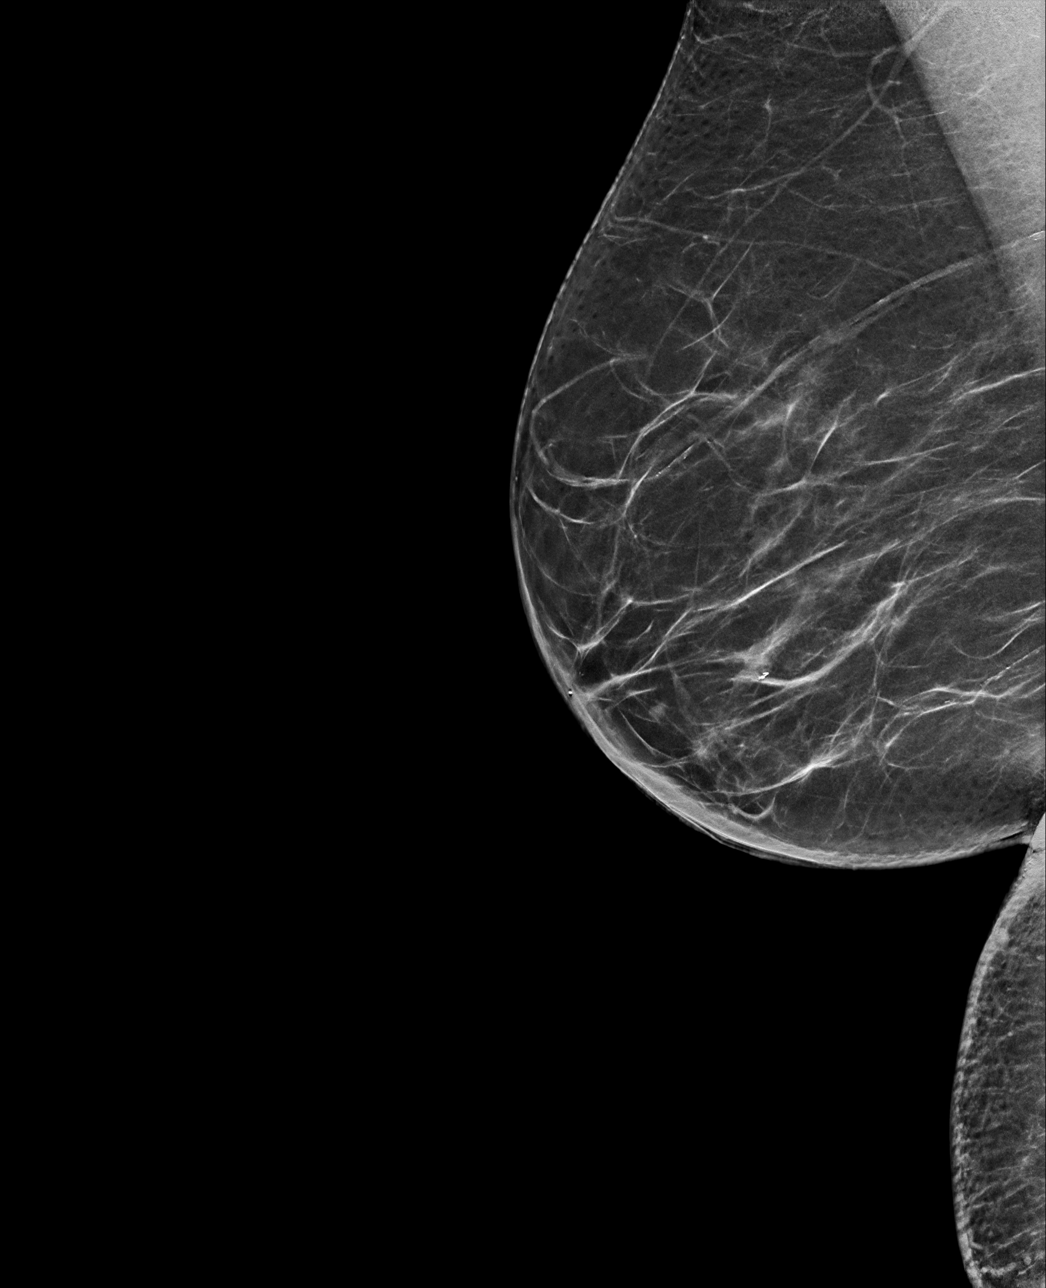

[R CC synth-2D]
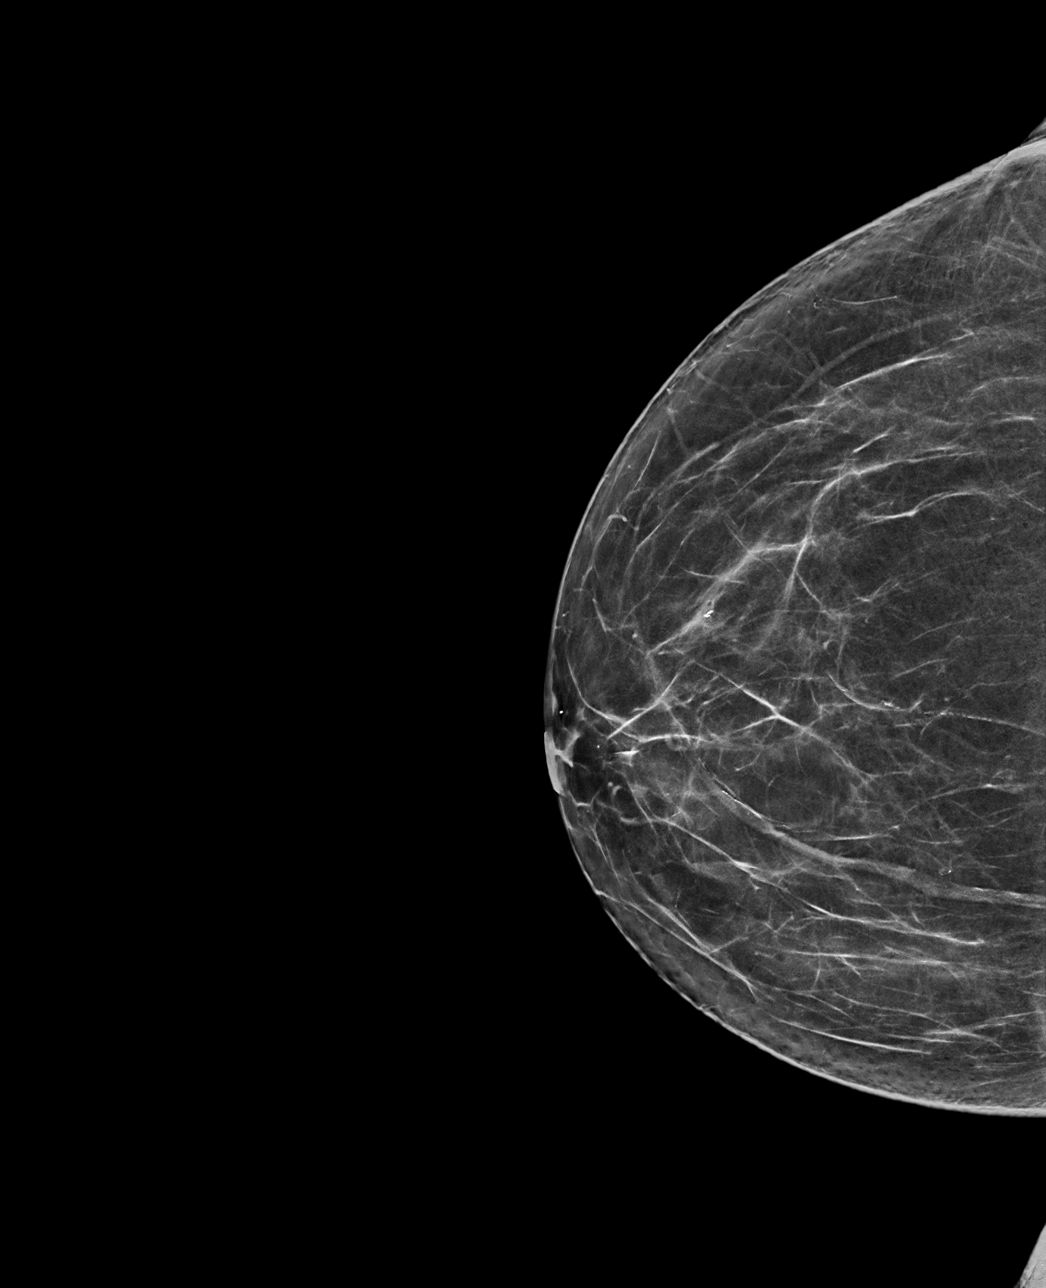

[R CC tomo · tomo slice 35/68.0]
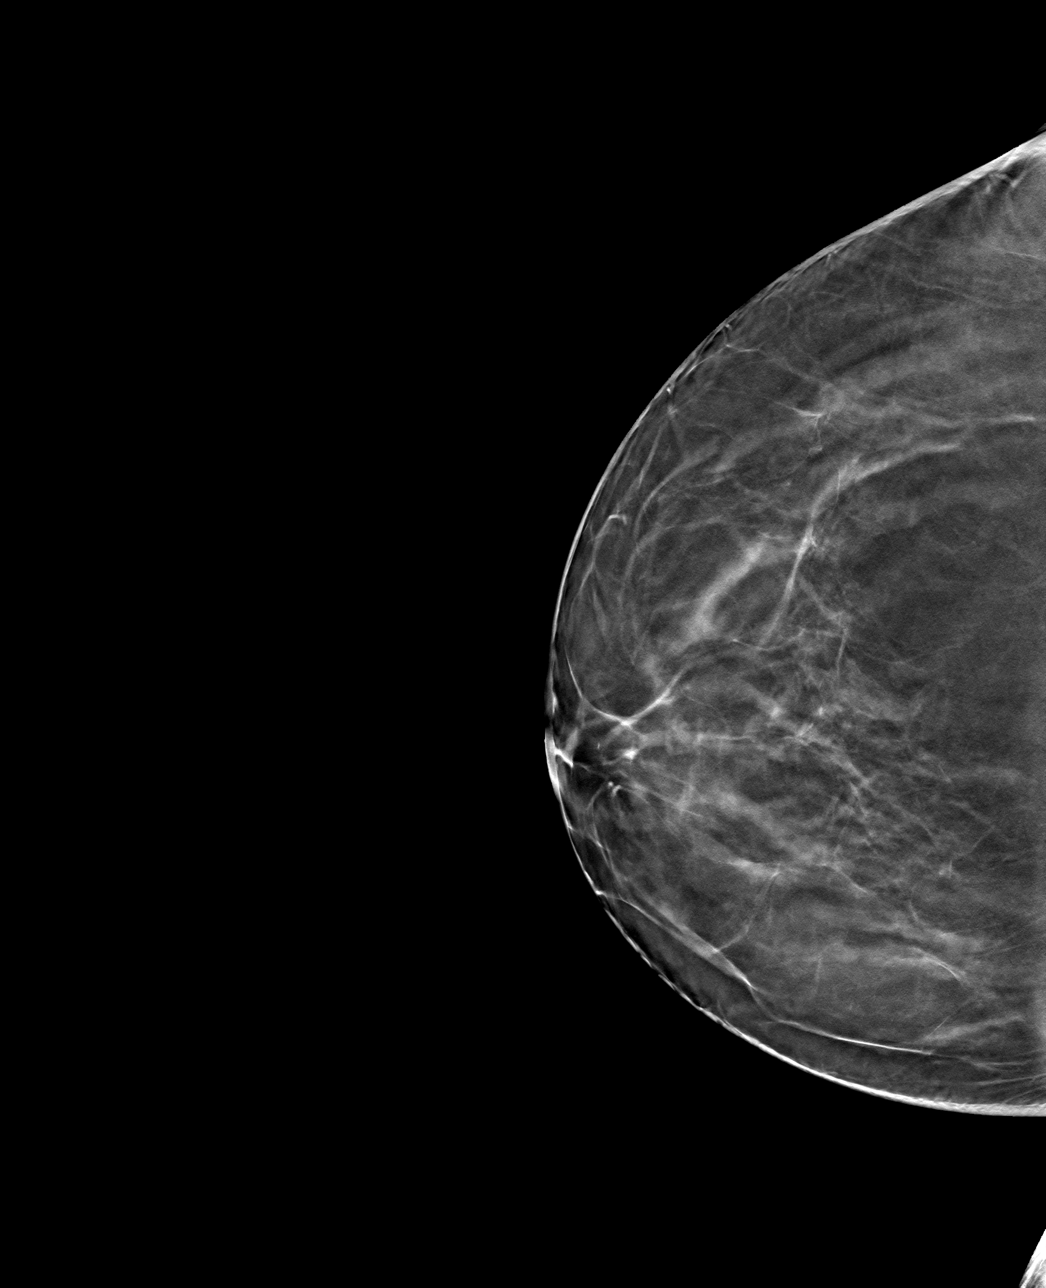

[R ML tomo · tomo slice 34/67.0]
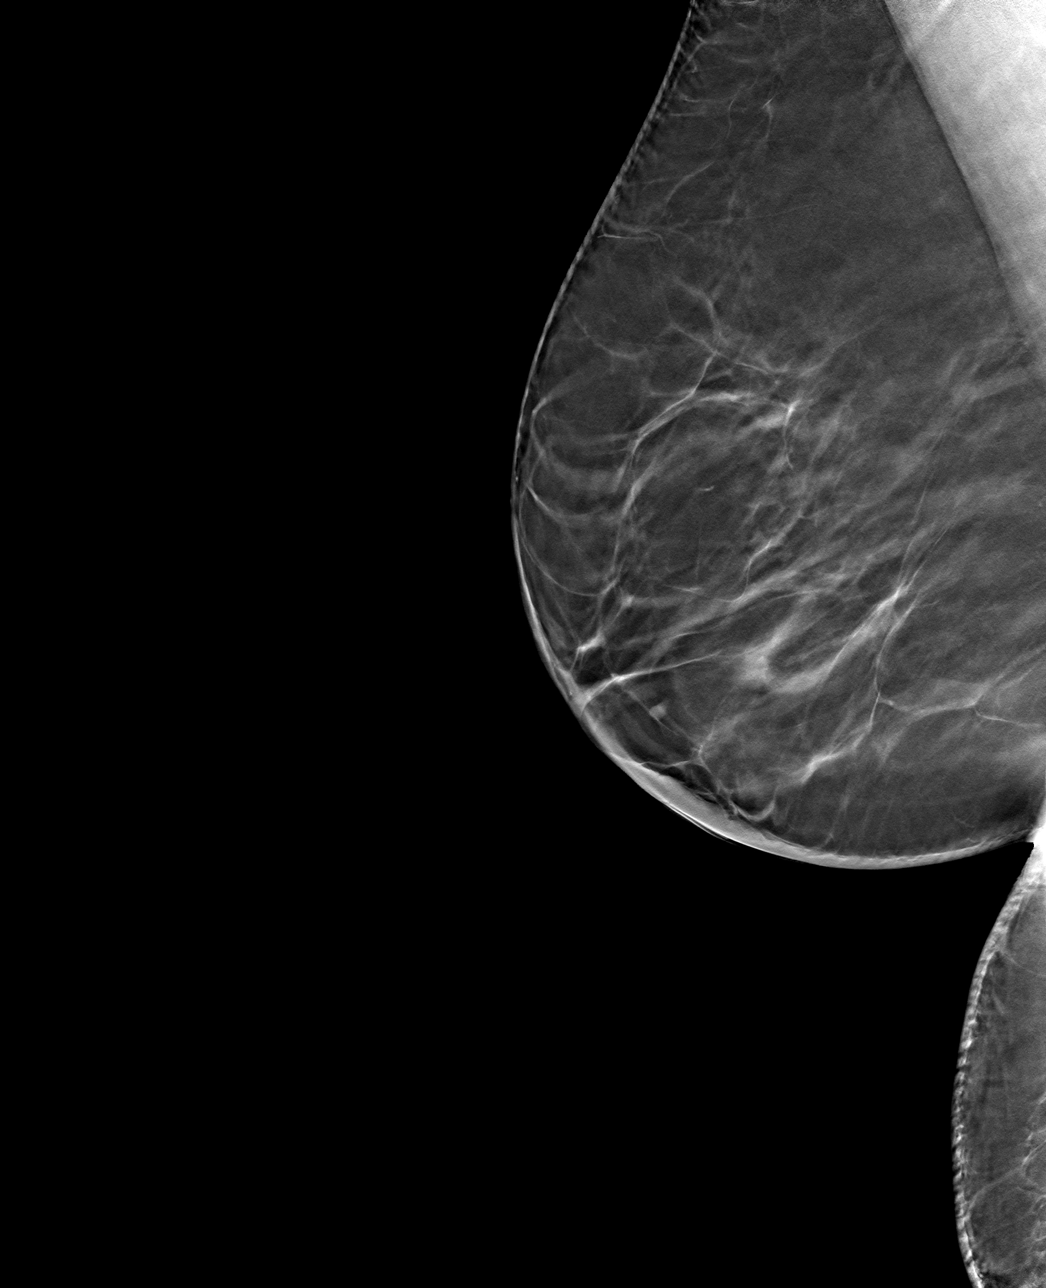

[4 of 12 positions shown; findings below may reference images not displayed]

FINDINGS: 3D Mammographic images were obtained following ultrasound guided
biopsy of a 0.6 cm mass at the right breast 7 o'clock position. The
biopsy marking clip is in expected position at the site of biopsy.
IMPRESSION: Appropriate positioning of the heart shaped biopsy marking clip at
the site of biopsy in the lower inner right breast at anterior
depth.

Final Assessment: Post Procedure Mammograms for Marker Placement

## 2021-04-28 IMAGING — MG US  BREAST BX W/ LOC DEV 1ST LESION IMG BX SPEC US GUIDE*R*
1 series · 8 of 8 positions shown · non-contrast
Comparison: Previous exam(s).
COMPARISON: Previous exam(s).

Addendum:
CLINICAL DATA: Mass at the right breast 7 o'clock position. History
of lymphoma.

EXAM:
ULTRASOUND GUIDED RIGHT BREAST CORE NEEDLE BIOPSY

[Series 1: MG view · 0.06mm/px · 8 of 9 slices shown]
[im 1/9]
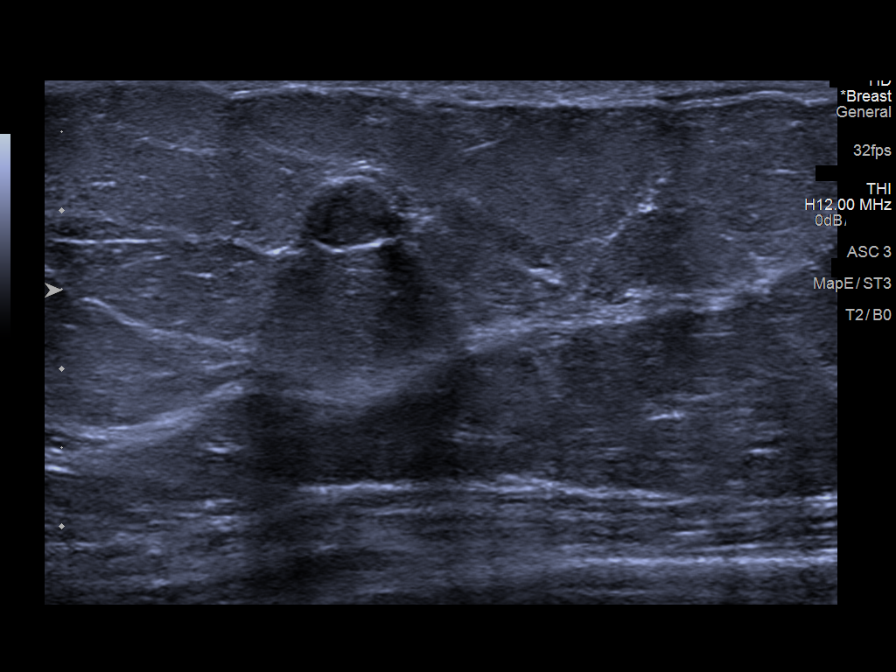
[im 2/9]
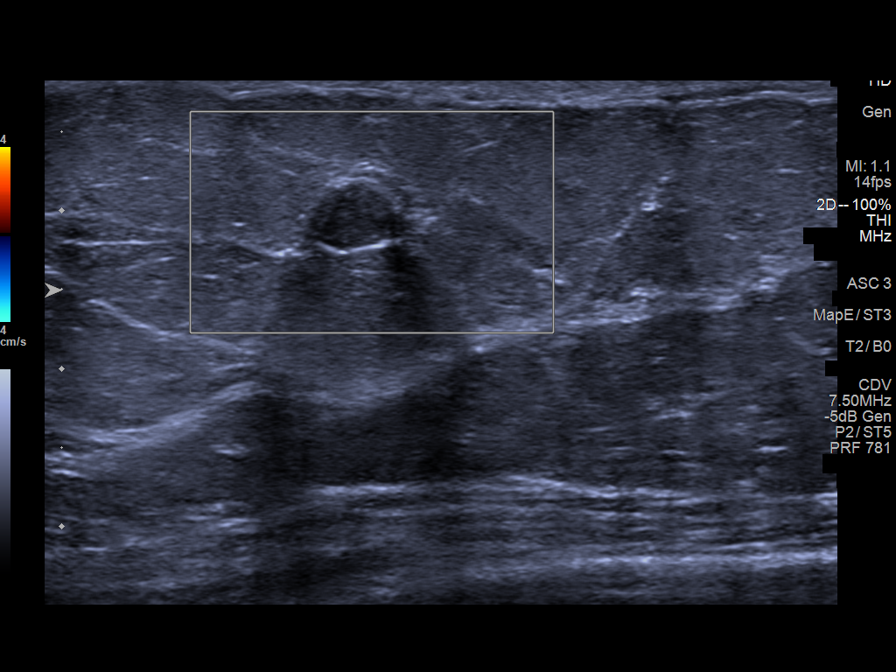
[im 3/9]
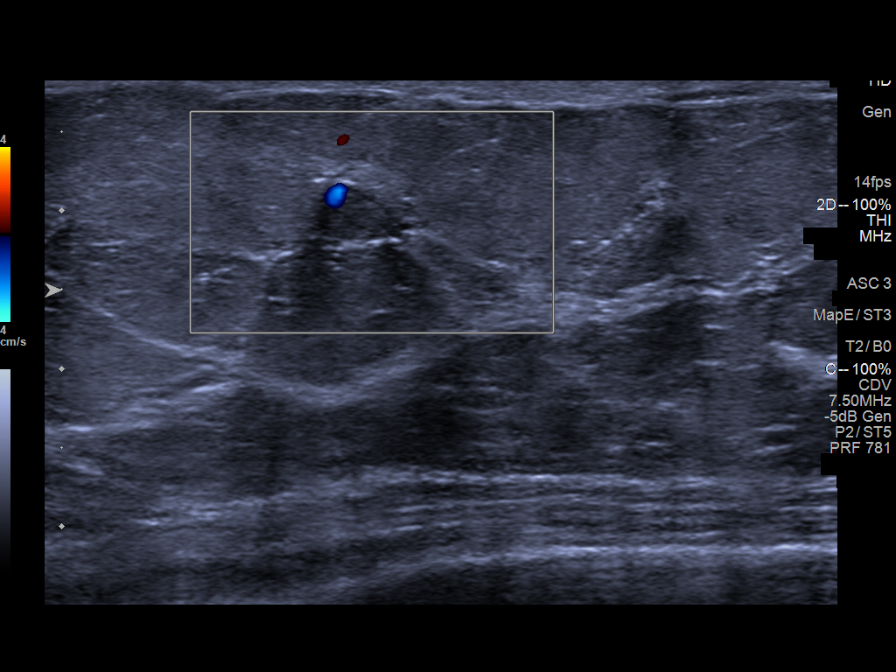
[im 4/9]
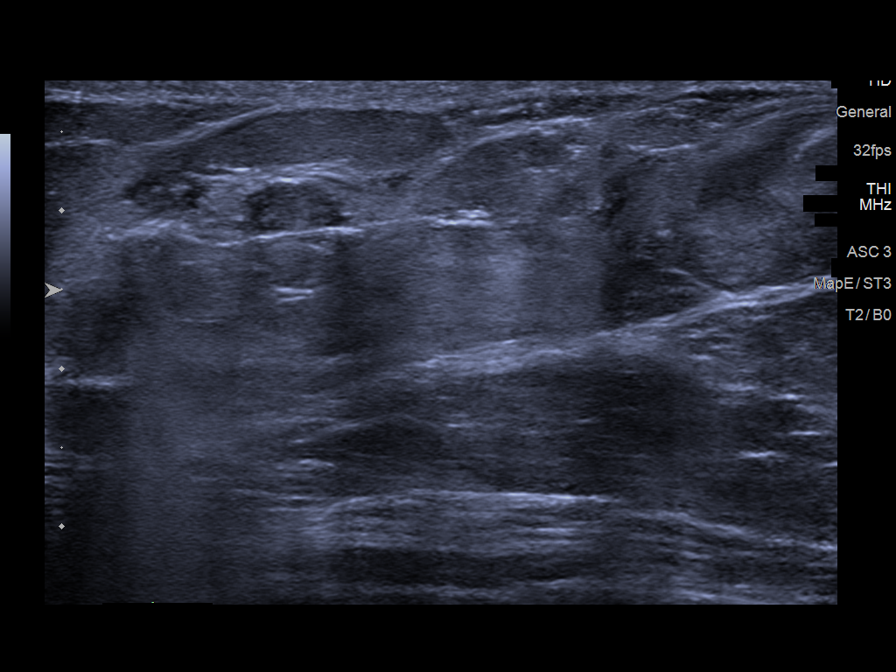
[im 5/9]
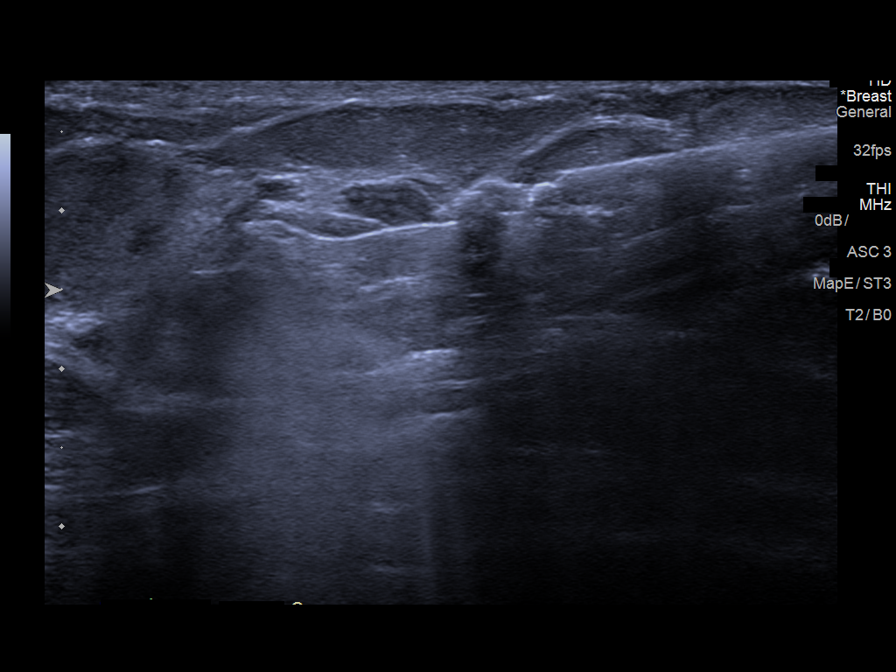
[im 6/9]
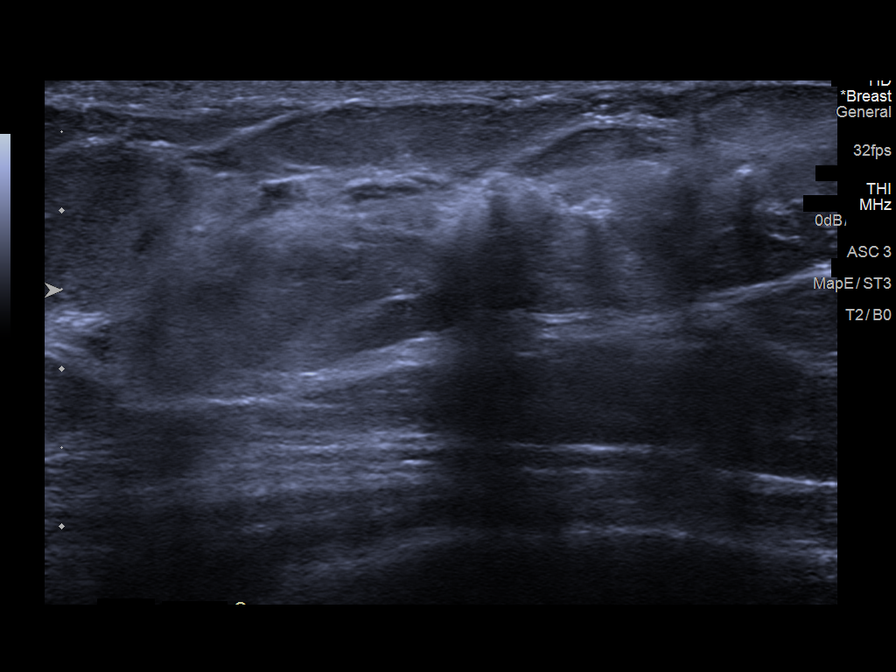
[im 7/9]
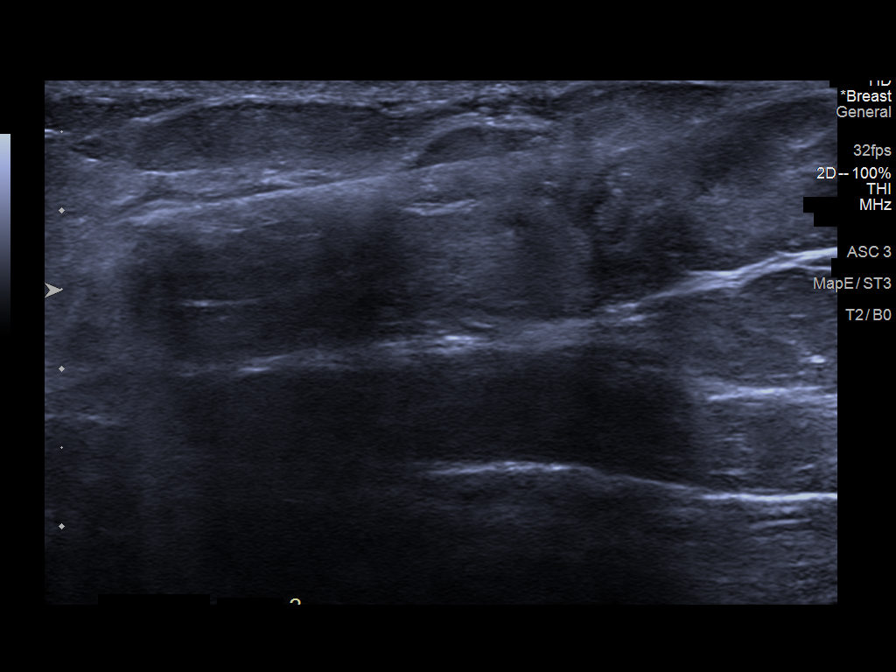
[im 9/9]
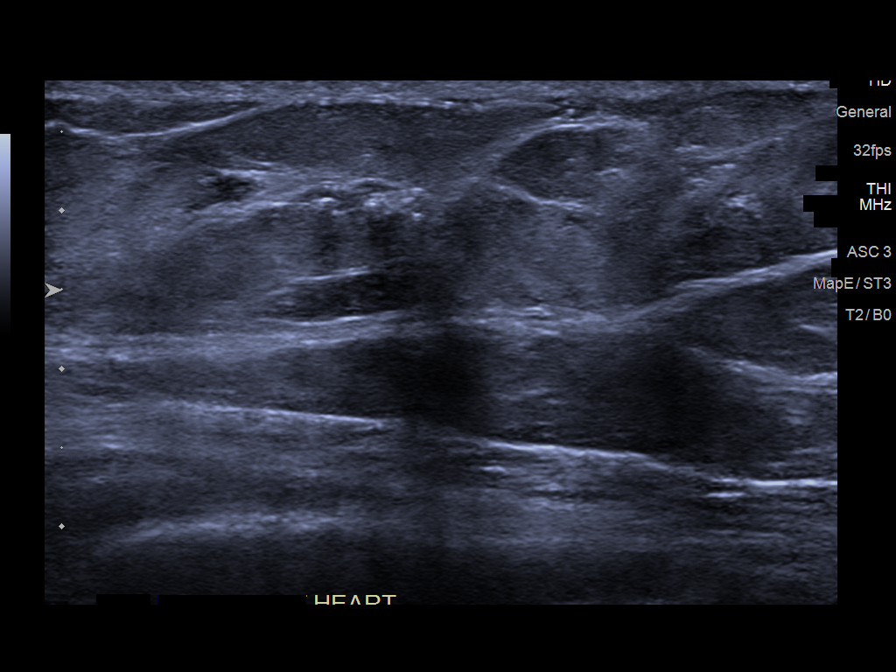

[8 of 8 positions shown; findings below may reference images not displayed]



Lesion quadrant: Lower outer quadrant

Using sterile technique and 1% Lidocaine as local anesthetic, under
direct ultrasound visualization, a 12 gauge MACIEL device was
used to perform biopsy of a 0.6 cm mass at the right breast 7
o'clock position 2 cm from the nipple using a medial approach. At
the conclusion of the procedure a heart shaped tissue marker clip
was deployed into the biopsy cavity. Follow up 2 view mammogram was
performed and dictated separately.
IMPRESSION: Ultrasound guided biopsy of a 0.6 cm mass at the right breast 7
o'clock position. No apparent complications.

ADDENDUM:
PATHOLOGY revealed: A. RIGHT BREAST, [DATE] 2 CMFN; ULTRASOUND-GUIDED
BIOPSY: - INTRADUCTAL PAPILLOMA WITH APOCRINE METAPLASIA AND FOCAL
USUAL DUCTAL HYPERPLASIA. - NEGATIVE FOR ATYPIA AND MALIGNANCY.

Pathology results are CONCORDANT with imaging findings, per Dr.
MACIEL.

Pathology results and recommendations were discussed with patient
via telephone on [DATE]. Patient reported doing well after the
biopsy with no adverse symptoms, and only slight tenderness at the
site. Post biopsy care instructions were reviewed, questions were
answered and my direct phone number was provided. Patient was
instructed to call [HOSPITAL] for any additional
questions or concerns related to biopsy site.

RECOMMENDATION: Surgical consultation for consideration of excision.
Request for surgical consultation relayed to MACIEL RN and
MACIEL RN at [HOSPITAL] [HOSPITAL] by MACIEL
RN on [DATE].

Pathology results reported by MACIEL RN on [DATE].



Lesion quadrant: Lower outer quadrant

Using sterile technique and 1% Lidocaine as local anesthetic, under
direct ultrasound visualization, a 12 gauge MACIEL device was
used to perform biopsy of a 0.6 cm mass at the right breast 7
o'clock position 2 cm from the nipple using a medial approach. At
the conclusion of the procedure a heart shaped tissue marker clip
was deployed into the biopsy cavity. Follow up 2 view mammogram was
performed and dictated separately.
IMPRESSION: Ultrasound guided biopsy of a 0.6 cm mass at the right breast 7
o'clock position. No apparent complications.

## 2021-05-02 LAB — SURGICAL PATHOLOGY

## 2021-05-02 NOTE — Progress Notes (Signed)
Patient scheduled for surgical consult with Dr. Windell Moment 05/03/21 at 9:15 to discuss breast biopsy results.

## 2021-05-03 ENCOUNTER — Ambulatory Visit: Payer: Self-pay | Admitting: General Surgery

## 2021-05-03 ENCOUNTER — Other Ambulatory Visit: Payer: Self-pay | Admitting: General Surgery

## 2021-05-03 DIAGNOSIS — D241 Benign neoplasm of right breast: Secondary | ICD-10-CM

## 2021-05-03 NOTE — H&P (Signed)
PATIENT PROFILE: Apryll Hinkle is a 65 y.o. female who presents to the Clinic for consultation at the request of Dr. Georgianne Fick for evaluation of intraductal papilloma.  PCP:  Joan Flores, NP  HISTORY OF PRESENT ILLNESS: Ms. Schouten reports she had her screening mammogram and she was found with a suspicious mass in the right breast.  There was also a suspicious lesion on the left breast.  Diagnostic mammogram and ultrasound shows that the suspicious lesion of the left breast was a complex benign cyst but the suspicious mass on the right breast was concerning for malignancy.  Diagnostic core biopsy of the right breast mass was done showing intraductal papilloma.  There is no atypia.  On ultrasound mass measured 0.6 cm.  Patient denies any breast pain.  Patient denies pain radiation.  No alleviating or aggravating factors.  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 18th, May 2021-PET scan lymphadenopathy bulky right neck; left neck; abdominal/retroperitoneal mesenteric and pelvic.  Maximum SUV 11.  No concerns for any transformation.  #Reviewed with the patient in general the criteria for initiating treatment include-weight loss; night sweats [see below]; fevers; extreme fatigue; organ compromise.   #Patient currently does not have any of the above symptoms; however based on PET scan/and the bulky disease-it is quite possible the patient could progress to be symptomatic quite soon.   #Discussed if and when initiated I would recommend-bendamustine Rituxan on a  monthly basis x6.  Discussed treatments are usually to control the disease; response rates are about 90%; and the median PFS is about 60 months.  Maintenance could be considered.  Discussed the potential side effects including but not limited to-increasing fatigue, nausea vomiting, diarrhea,  sores in the mouth, increase risk of infection.    #Hot flashes/cold sweats-chronic secondary to postmenopausal status; patient denies any worsening of her symptomatology recently.  Monitor closely  #Patient is quite emotional; otherwise fairly asymptomatic-would recommend surveillance at this time.  I would recommend follow-up in 2 months-to assess for symptomatology.   # DISPOSITION:  # Follow up in 2 months-MD; labs- cbc/cmp/ldh-Dr.B  # I reviewed the blood work- with the patient in detail; also reviewed the imaging independently [as summarized above]; and with the patient in detail.   Cc; Dr.Cintron/[facility].      Left bundle branch block (LBBB) on electrocardiogram 01/20/2020       GENERAL REVIEW OF SYSTEMS:   General ROS: negative for - chills, fatigue, fever, weight gain or weight loss Allergy and Immunology ROS: negative for - hives  Hematological and Lymphatic ROS: negative for - bleeding problems or bruising, negative for palpable nodes Endocrine ROS: negative for - heat or cold intolerance, hair changes Respiratory ROS: negative for - cough, shortness of breath or wheezing Cardiovascular ROS: no chest pain or palpitations GI ROS: negative for nausea, vomiting, abdominal pain, diarrhea, constipation Musculoskeletal ROS: negative for - joint swelling or muscle pain Neurological ROS: negative for - confusion, syncope Dermatological ROS: negative for pruritus and rash Psychiatric: negative for anxiety, depression, difficulty  sleeping and memory loss  MEDICATIONS: Current Outpatient Medications  Medication Sig Dispense Refill   acyclovir (ZOVIRAX) 400 MG tablet TAKE 1 TABLET BY MOUTH  TWICE DAILY TO PREVENT SHINGLES.     albuterol 90 mcg/actuation inhaler Inhale 2 inhalations into the lungs every 6 (six) hours as needed 1 each 1   ALPRAZolam (XANAX) 0.5 MG tablet Take 1 tablet (0.5 mg total) by mouth 2 (two) times daily as needed 60 tablet 5   azelastine (ASTELIN) 137 mcg nasal spray Place 1 spray into both nostrils 2 (two) times daily 30 mL 11   cetirizine (ZYRTEC) 10 MG tablet Take 1 tablet (10 mg total) by mouth once daily 30 tablet 11   diclofenac (VOLTAREN) 1 % topical gel Apply 2 g topically 4 (four) times daily 100 g 1   escitalopram oxalate (LEXAPRO) 20 MG tablet Take 1 tablet (20 mg total) by mouth once daily for 90 days 30 tablet 2   fluticasone propionate (FLONASE) 50 mcg/actuation nasal spray Place 2 sprays into both nostrils once daily 16 g 1   lidocaine-prilocaine (EMLA) cream      naproxen (NAPROSYN) 500 MG tablet Take 1 tablet (500 mg total) by mouth 2 (two) times daily with meals for 30 days 60 tablet 3   prochlorperazine (COMPAZINE) 10 MG tablet TAKE 1 TABLET BY MOUTH EVERY 6 HOURS AS NEEDED FOR NAUSEA FOR VOMITING     promethazine-dextromethorphan (PROMETHAZINE-DM) 6.25-15 mg/5 mL syrup Take 5 mLs by mouth every 6 (six) hours as needed for Cough 120 mL 0   zolpidem (AMBIEN CR) 6.25 MG CR tablet Take 1 tablet (6.25 mg total) by mouth at bedtime as needed 30 tablet 5   amLODIPine (NORVASC) 5 MG tablet Take 5 mg by mouth once daily (Patient not taking: No sig reported)     No current facility-administered medications for this visit.    ALLERGIES: Penicillin  PAST MEDICAL HISTORY: Past Medical History:  Diagnosis Date   Allergy    History of cancer 3/57/0177   Follicular Lymphoma   Hypertension    Stroke (CMS-HCC)     PAST SURGICAL HISTORY: Past Surgical History:  Procedure Laterality Date   CHOLECYSTECTOMY     Excision of right cervical node  Right 01/08/2020   HYSTERECTOMY       FAMILY HISTORY: Family History  Problem Relation Age of Onset    Stroke Father    Myocardial Infarction (Heart attack) Brother      SOCIAL HISTORY: Social History   Socioeconomic History   Marital status: Married   Number of children: 10  Tobacco Use   Smoking status: Never Smoker   Smokeless tobacco: Never Used  Scientific laboratory technician Use: Never used  Substance and Sexual Activity   Alcohol use: No   Drug use: Never    PHYSICAL EXAM: Vitals:   05/03/21 0918  BP: (!) 150/95  Pulse: 93   Body mass index is 33.48 kg/m. Weight: 85.7 kg (189 lb)   GENERAL: Alert, active, oriented x3  HEENT: Pupils equal reactive to light. Extraocular movements are intact. Sclera clear. Palpebral conjunctiva normal red color.Pharynx clear.  NECK: Supple with no palpable mass and no adenopathy.  LUNGS: Sound clear with no rales rhonchi or wheezes.  HEART: Regular rhythm S1 and S2 without murmur.  BREAST: breasts appear normal, no suspicious masses, no skin or nipple changes or axillary nodes.  ABDOMEN: Soft and depressible, nontender with no palpable mass, no hepatomegaly.  EXTREMITIES: Well-developed well-nourished symmetrical with  no dependent edema.  NEUROLOGICAL: Awake alert oriented, facial expression symmetrical, moving all extremities.  REVIEW OF DATA: I have reviewed the following data today: No visits with results within 3 Month(s) from this visit.  Latest known visit with results is:  Office Visit on 12/15/2020  Component Date Value   C Reactive Protein - [facility] 12/15/2020 4    RA Latex Turbid. - [facility] 12/15/2020 <10.0    Sed Rate - [facility] 12/15/2020 2      ASSESSMENT: Ms. Mates is a 65 y.o. female presenting for consultation for right breast intraductal papilloma.    Patient was oriented again about the pathology results. Surgical alternatives were discussed with patient including excisional biopsy. Surgical technique and post operative care was discussed with patient. Risk of surgery was discussed with patient including but not  limited to: wound infection, seroma, hematoma, brachial plexopathy, mondor's disease (thrombosis of small veins of breast), chronic wound pain, breast lymphedema, altered sensation to the nipple and cosmesis among others.   Patient was oriented that the purpose of the surgery is to rule out malignancy and not for treatment.  She understand the low risk of upgrade to malignancy.  Since patient has close family members with breast cancer and she has personal history of blood malignancy she would like to proceed with excisional biopsy to rule out malignancy.  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   Patient verbalized understanding, all questions were answered, and were agreeable with the plan outlined above.     Herbert Pun, MD  Electronically signed by Herbert Pun, MD

## 2021-05-03 NOTE — H&P (View-Only) (Signed)
PATIENT PROFILE: Danielle Lewis is a 65 y.o. female who presents to the Clinic for consultation at the request of Dr. Georgianne Fick for evaluation of intraductal papilloma.  PCP:  Danielle Flores, NP  HISTORY OF PRESENT ILLNESS: Danielle Lewis reports she had her screening mammogram and she was found with a suspicious mass in the right breast.  There was also a suspicious lesion on the left breast.  Diagnostic mammogram and ultrasound shows that the suspicious lesion of the left breast was a complex benign cyst but the suspicious mass on the right breast was concerning for malignancy.  Diagnostic core biopsy of the right breast mass was done showing intraductal papilloma.  There is no atypia.  On ultrasound mass measured 0.6 cm.  Patient denies any breast pain.  Patient denies pain radiation.  No alleviating or aggravating factors.  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 18th, May 2021-PET scan lymphadenopathy bulky right neck; left neck; abdominal/retroperitoneal mesenteric and pelvic.  Maximum SUV 11.  No concerns for any transformation.  #Reviewed with the patient in general the criteria for initiating treatment include-weight loss; night sweats [see below]; fevers; extreme fatigue; organ compromise.   #Patient currently does not have any of the above symptoms; however based on PET scan/and the bulky disease-it is quite possible the patient could progress to be symptomatic quite soon.   #Discussed if and when initiated I would recommend-bendamustine Rituxan on a  monthly basis x6.  Discussed treatments are usually to control the disease; response rates are about 90%; and the median PFS is about 60 months.  Maintenance could be considered.  Discussed the potential side effects including but not limited to-increasing fatigue, nausea vomiting, diarrhea,  sores in the mouth, increase risk of infection.    #Hot flashes/cold sweats-chronic secondary to postmenopausal status; patient denies any worsening of her symptomatology recently.  Monitor closely  #Patient is quite emotional; otherwise fairly asymptomatic-would recommend surveillance at this time.  I would recommend follow-up in 2 months-to assess for symptomatology.   # DISPOSITION:  # Follow up in 2 months-MD; labs- cbc/cmp/ldh-Dr.B  # I reviewed the blood work- with the patient in detail; also reviewed the imaging independently [as summarized above]; and with the patient in detail.   Cc; Dr.Cintron/Fitzgerald.      Left bundle branch block (LBBB) on electrocardiogram 01/20/2020       GENERAL REVIEW OF SYSTEMS:   General ROS: negative for - chills, fatigue, fever, weight gain or weight loss Allergy and Immunology ROS: negative for - hives  Hematological and Lymphatic ROS: negative for - bleeding problems or bruising, negative for palpable nodes Endocrine ROS: negative for - heat or cold intolerance, hair changes Respiratory ROS: negative for - cough, shortness of breath or wheezing Cardiovascular ROS: no chest pain or palpitations GI ROS: negative for nausea, vomiting, abdominal pain, diarrhea, constipation Musculoskeletal ROS: negative for - joint swelling or muscle pain Neurological ROS: negative for - confusion, syncope Dermatological ROS: negative for pruritus and rash Psychiatric: negative for anxiety, depression, difficulty  sleeping and memory loss  MEDICATIONS: Current Outpatient Medications  Medication Sig Dispense Refill   acyclovir (ZOVIRAX) 400 MG tablet TAKE 1 TABLET BY MOUTH  TWICE DAILY TO PREVENT SHINGLES.     albuterol 90 mcg/actuation inhaler Inhale 2 inhalations into the lungs every 6 (six) hours as needed 1 each 1   ALPRAZolam (XANAX) 0.5 MG tablet Take 1 tablet (0.5 mg total) by mouth 2 (two) times daily as needed 60 tablet 5   azelastine (ASTELIN) 137 mcg nasal spray Place 1 spray into both nostrils 2 (two) times daily 30 mL 11   cetirizine (ZYRTEC) 10 MG tablet Take 1 tablet (10 mg total) by mouth once daily 30 tablet 11   diclofenac (VOLTAREN) 1 % topical gel Apply 2 g topically 4 (four) times daily 100 g 1   escitalopram oxalate (LEXAPRO) 20 MG tablet Take 1 tablet (20 mg total) by mouth once daily for 90 days 30 tablet 2   fluticasone propionate (FLONASE) 50 mcg/actuation nasal spray Place 2 sprays into both nostrils once daily 16 g 1   lidocaine-prilocaine (EMLA) cream      naproxen (NAPROSYN) 500 MG tablet Take 1 tablet (500 mg total) by mouth 2 (two) times daily with meals for 30 days 60 tablet 3   prochlorperazine (COMPAZINE) 10 MG tablet TAKE 1 TABLET BY MOUTH EVERY 6 HOURS AS NEEDED FOR NAUSEA FOR VOMITING     promethazine-dextromethorphan (PROMETHAZINE-DM) 6.25-15 mg/5 mL syrup Take 5 mLs by mouth every 6 (six) hours as needed for Cough 120 mL 0   zolpidem (AMBIEN CR) 6.25 MG CR tablet Take 1 tablet (6.25 mg total) by mouth at bedtime as needed 30 tablet 5   amLODIPine (NORVASC) 5 MG tablet Take 5 mg by mouth once daily (Patient not taking: No sig reported)     No current facility-administered medications for this visit.    ALLERGIES: Penicillin  PAST MEDICAL HISTORY: Past Medical History:  Diagnosis Date   Allergy    History of cancer 1/61/0960   Follicular Lymphoma   Hypertension    Stroke (CMS-HCC)     PAST SURGICAL HISTORY: Past Surgical History:  Procedure Laterality Date   CHOLECYSTECTOMY     Excision of right cervical node  Right 01/08/2020   HYSTERECTOMY       FAMILY HISTORY: Family History  Problem Relation Age of Onset    Stroke Father    Myocardial Infarction (Heart attack) Brother      SOCIAL HISTORY: Social History   Socioeconomic History   Marital status: Married   Number of children: 10  Tobacco Use   Smoking status: Never Smoker   Smokeless tobacco: Never Used  Scientific laboratory technician Use: Never used  Substance and Sexual Activity   Alcohol use: No   Drug use: Never    PHYSICAL EXAM: Vitals:   05/03/21 0918  BP: (!) 150/95  Pulse: 93   Body mass index is 33.48 kg/m. Weight: 85.7 kg (189 lb)   GENERAL: Alert, active, oriented x3  HEENT: Pupils equal reactive to light. Extraocular movements are intact. Sclera clear. Palpebral conjunctiva normal red color.Pharynx clear.  NECK: Supple with no palpable mass and no adenopathy.  LUNGS: Sound clear with no rales rhonchi or wheezes.  HEART: Regular rhythm S1 and S2 without murmur.  BREAST: breasts appear normal, no suspicious masses, no skin or nipple changes or axillary nodes.  ABDOMEN: Soft and depressible, nontender with no palpable mass, no hepatomegaly.  EXTREMITIES: Well-developed well-nourished symmetrical with  no dependent edema.  NEUROLOGICAL: Awake alert oriented, facial expression symmetrical, moving all extremities.  REVIEW OF DATA: I have reviewed the following data today: No visits with results within 3 Month(s) from this visit.  Latest known visit with results is:  Office Visit on 12/15/2020  Component Date Value   C Reactive Protein - Lab[facility] 12/15/2020 4    RA Latex Turbid. - [facility] 12/15/2020 <10.0    Sed Rate - [facility] 12/15/2020 2      ASSESSMENT: Ms. Riddle is a 65 y.o. female presenting for consultation for right breast intraductal papilloma.    Patient was oriented again about the pathology results. Surgical alternatives were discussed with patient including excisional biopsy. Surgical technique and post operative care was discussed with patient. Risk of surgery was discussed with patient including but not  limited to: wound infection, seroma, hematoma, brachial plexopathy, mondor's disease (thrombosis of small veins of breast), chronic wound pain, breast lymphedema, altered sensation to the nipple and cosmesis among others.   Patient was oriented that the purpose of the surgery is to rule out malignancy and not for treatment.  She understand the low risk of upgrade to malignancy.  Since patient has close family members with breast cancer and she has personal history of blood malignancy she would like to proceed with excisional biopsy to rule out malignancy.  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   Patient verbalized understanding, all questions were answered, and were agreeable with the plan outlined above.     Herbert Pun, MD  Electronically signed by Herbert Pun, MD

## 2021-05-08 ENCOUNTER — Ambulatory Visit: Admission: RE | Admit: 2021-05-08 | Payer: 59 | Source: Ambulatory Visit

## 2021-05-08 ENCOUNTER — Inpatient Hospital Stay: Admission: RE | Admit: 2021-05-08 | Payer: 59 | Source: Ambulatory Visit

## 2021-05-10 ENCOUNTER — Ambulatory Visit: Admission: RE | Admit: 2021-05-10 | Payer: 59 | Source: Ambulatory Visit

## 2021-05-11 ENCOUNTER — Encounter: Payer: Self-pay | Admitting: Urgent Care

## 2021-05-11 ENCOUNTER — Encounter
Admission: RE | Admit: 2021-05-11 | Discharge: 2021-05-11 | Disposition: A | Discharge status: Home / Self Care | Payer: 59 | Source: Ambulatory Visit | Attending: General Surgery | Admitting: General Surgery

## 2021-05-11 ENCOUNTER — Other Ambulatory Visit: Payer: Self-pay

## 2021-05-11 DIAGNOSIS — Z01810 Encounter for preprocedural cardiovascular examination: Secondary | ICD-10-CM | POA: Diagnosis present

## 2021-05-11 HISTORY — DX: Headache, unspecified: R51.9

## 2021-05-11 HISTORY — DX: Unspecified osteoarthritis, unspecified site: M19.90

## 2021-05-11 HISTORY — DX: Malignant (primary) neoplasm, unspecified: C80.1

## 2021-05-11 HISTORY — DX: Pneumonia, unspecified organism: J18.9

## 2021-05-11 NOTE — Patient Instructions (Addendum)
Your procedure is scheduled on: 05/19/21 - Friday Report to the Registration Desk on the 1st floor of the [facility]. To find out your arrival time, please call [phone] between 1PM - 3PM on: 05/18/21 - Thursday Report to [facility] for EKG on 05/11/21 at 11:00 am.  REMEMBER: Instructions that are not followed completely may result in serious medical risk, up to and including death; or upon the discretion of your surgeon and anesthesiologist your surgery may need to be rescheduled.  Do not eat food after midnight the night before surgery.  No gum chewing, lozengers or hard candies.  You may however, drink CLEAR liquids up to 2 hours before you are scheduled to arrive for your surgery. Do not drink anything within 2 hours of your scheduled arrival time.  Clear liquids include: - water  - apple juice without pulp - gatorade (not RED, PURPLE, OR BLUE) - black coffee or tea (Do NOT add milk or creamers to the coffee or tea) Do NOT drink anything that is not on this list.  TAKE THESE MEDICATIONS THE MORNING OF SURGERY WITH A SIP OF WATER:  - ALPRAZolam (XANAX) 0.5 MG tablet - cetirizine (ZYRTEC) 10 MG tablet  One week prior to surgery: Stop Anti-inflammatories (NSAIDS) such as Advil, Aleve, Ibuprofen, Motrin, Naproxen, Naprosyn and Aspirin based products such as Excedrin, Goodys Powder, BC Powder.  Stop ANY OVER THE COUNTER supplements until after surgery.  You may take Tylenol as directed  if needed for pain up until the day of surgery.  No Alcohol for 24 hours before or after surgery.  No Smoking including e-cigarettes for 24 hours prior to surgery.  No chewable tobacco products for at least 6 hours prior to surgery.  No nicotine patches on the day of surgery.  Do not use any "recreational" drugs for at least a week prior to your surgery.  Please be advised that the combination of cocaine and anesthesia may have negative outcomes, up to and including death. If you test  positive for cocaine, your surgery will be cancelled.  On the morning of surgery brush your teeth with toothpaste and water, you may rinse your mouth with mouthwash if you wish. Do not swallow any toothpaste or mouthwash.  Use CHG Soap or wipes as directed on instruction sheet.  Do not wear jewelry, make-up, hairpins, clips or nail polish.  Do not wear lotions, powders, or perfumes.   Do not shave body from the neck down 48 hours prior to surgery just in case you cut yourself which could leave a site for infection.  Also, freshly shaved skin may become irritated if using the CHG soap.  Contact lenses, hearing aids and dentures may not be worn into surgery.  Do not bring valuables to the hospital. [facility] is not responsible for any missing/lost belongings or valuables.   Notify your doctor if there is any change in your medical condition (cold, fever, infection).  Wear comfortable clothing (specific to your surgery type) to the hospital.  After surgery, you can help prevent lung complications by doing breathing exercises.  Take deep breaths and cough every 1-2 hours. Your doctor may order a device called an Incentive Spirometer to help you take deep breaths. When coughing or sneezing, hold a pillow firmly against your incision with both hands. This is called "splinting." Doing this helps protect your incision. It also decreases belly discomfort.  If you are being admitted to the hospital overnight, leave your suitcase in the car. After  surgery it may be brought to your room.  If you are being discharged the day of surgery, you will not be allowed to drive home. You will need a responsible adult (18 years or older) to drive you home and stay with you that night.   If you are taking public transportation, you will need to have a responsible adult (18 years or older) with you. Please confirm with your physician that it is acceptable to use public transportation.   Please call the  [facility]. at [phone] if you have any questions about these instructions.  Surgery Visitation Policy:  Patients undergoing a surgery or procedure may have one family member or support person with them as long as that person is not COVID-19 positive or experiencing its symptoms.  That person may remain in the waiting area during the procedure.  Inpatient Visitation:    Visiting hours are 7 a.m. to 8 p.m. Inpatients will be allowed two visitors daily. The visitors may change each day during the patient's stay. No visitors under the age of 92. Any visitor under the age of 16 must be accompanied by an adult. The visitor must pass COVID-19 screenings, use hand sanitizer when entering and exiting the patient's room and wear a mask at all times, including in the patient's room. Patients must also wear a mask when staff or their visitor are in the room. Masking is required regardless of vaccination status.

## 2021-05-16 ENCOUNTER — Other Ambulatory Visit: Payer: Self-pay

## 2021-05-16 ENCOUNTER — Ambulatory Visit
Admission: RE | Admit: 2021-05-16 | Discharge: 2021-05-16 | Disposition: A | Discharge status: Home / Self Care | Payer: 59 | Source: Ambulatory Visit | Attending: General Surgery | Admitting: General Surgery

## 2021-05-16 DIAGNOSIS — D241 Benign neoplasm of right breast: Secondary | ICD-10-CM | POA: Diagnosis not present

## 2021-05-16 IMAGING — MG MM PLC BREAST LOC DEV 1ST LESION INC*R*
8 series · 8 of 8 positions shown · non-contrast
Comparison: Previous exam(s)

CLINICAL DATA: Right breast intraductal papilloma, presurgical
localization.

EXAM:
MAMMOGRAPHIC GUIDED RADIOFREQUENCY DEVICE
LOCALIZATION OF THE RIGHT BREAST

[R CC (1 of 5)]
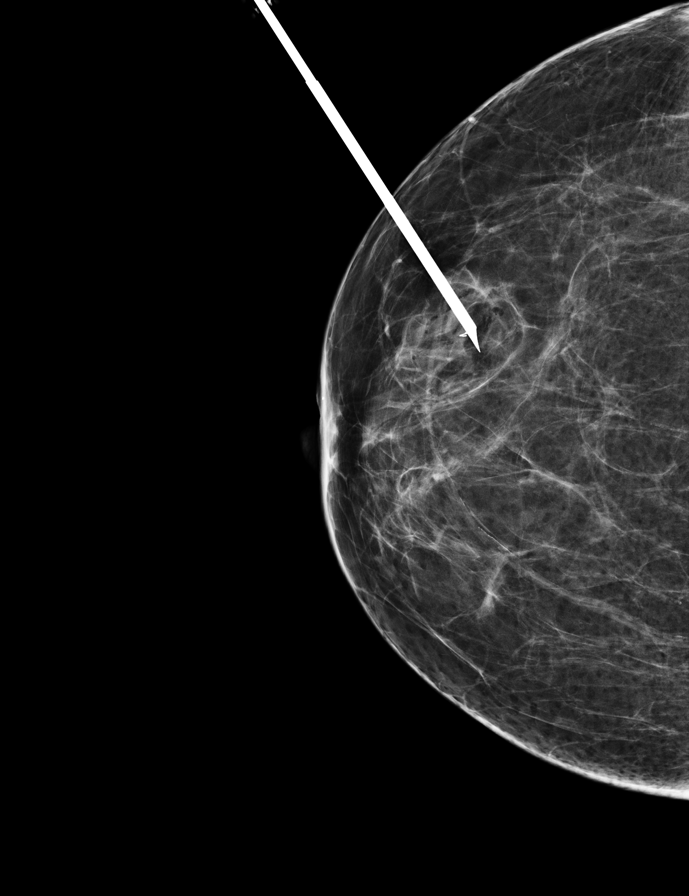

[R CC (2 of 5)]
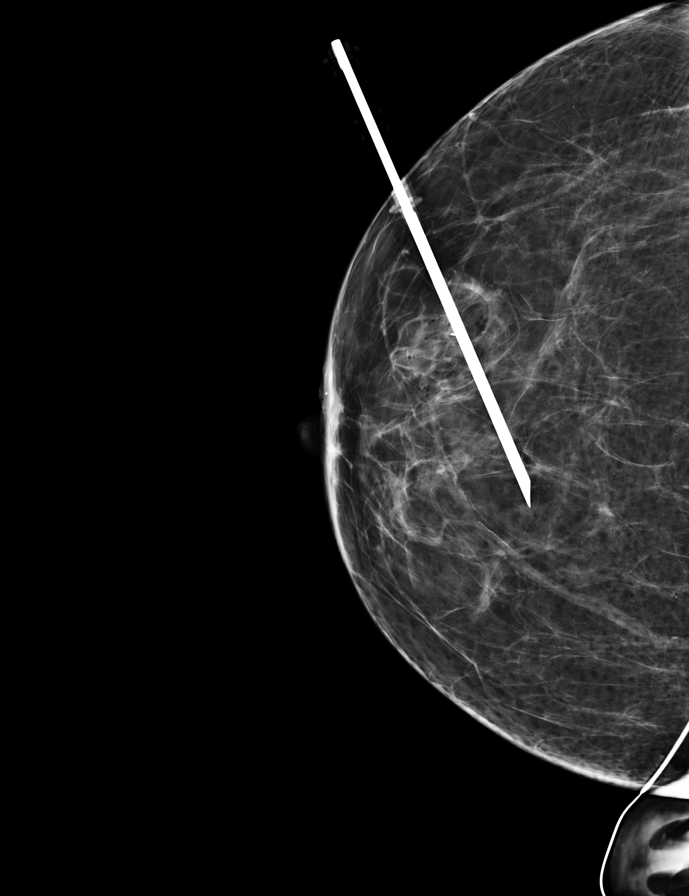

[R CC (3 of 5)]
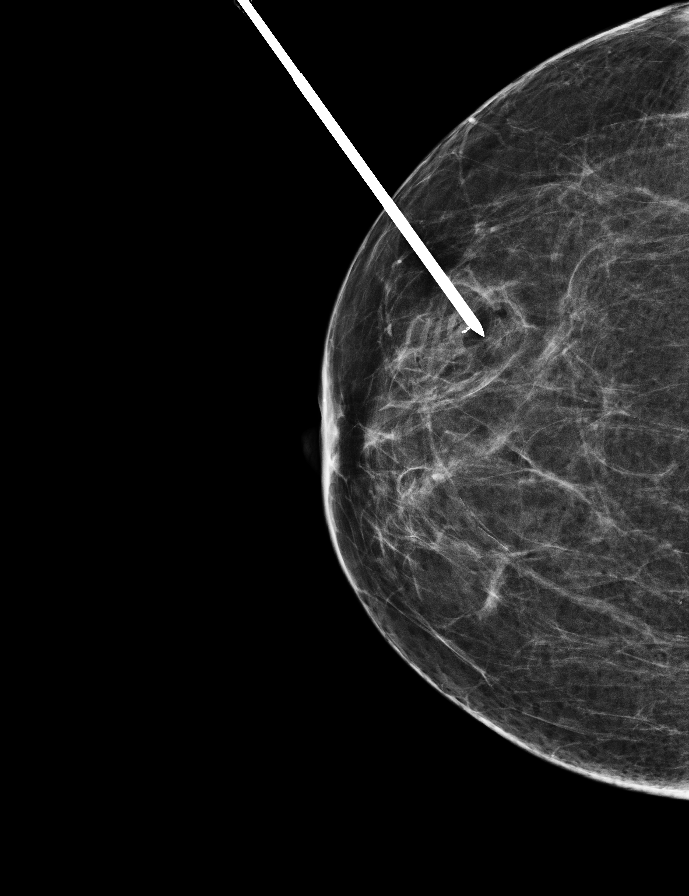

[R CC (4 of 5)]
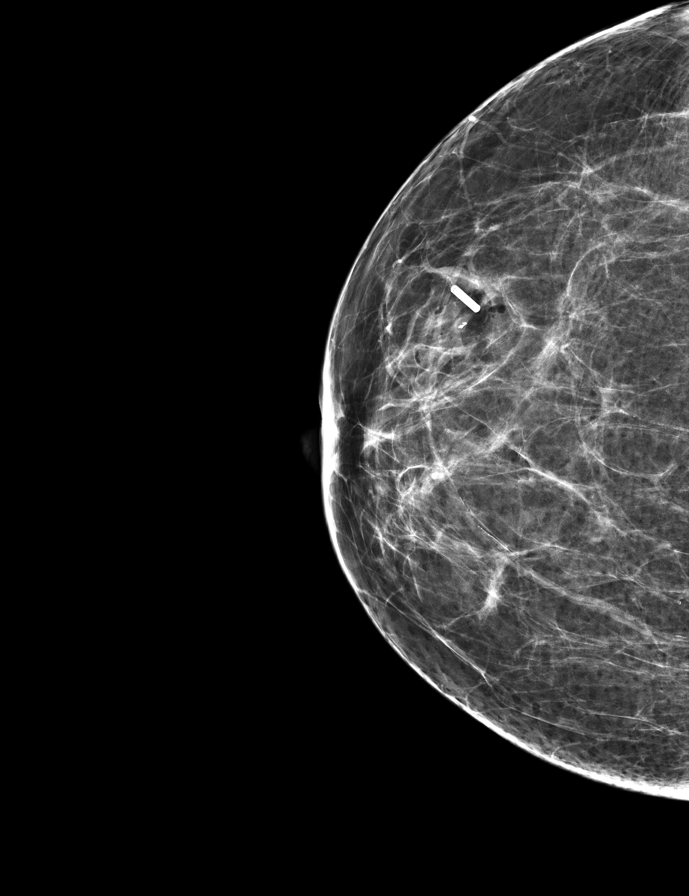

[R CC (5 of 5)]
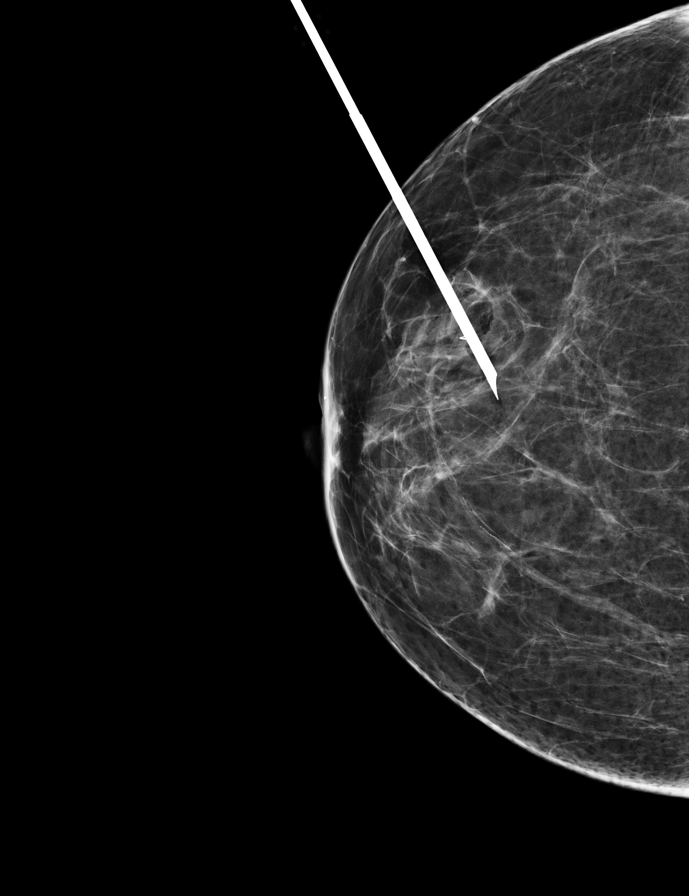

[R LM (1 of 3)]
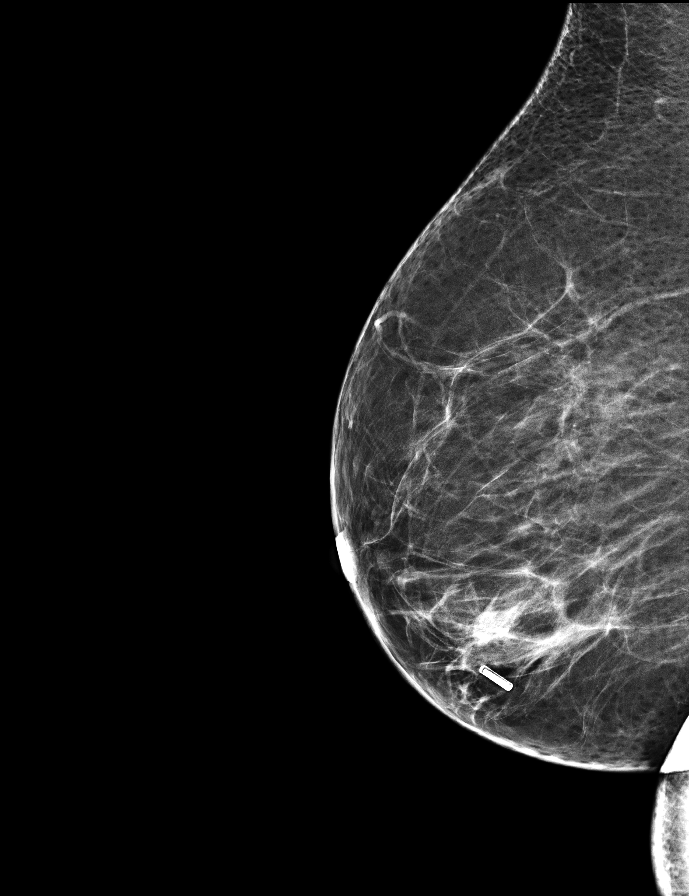

[R LM (2 of 3)]
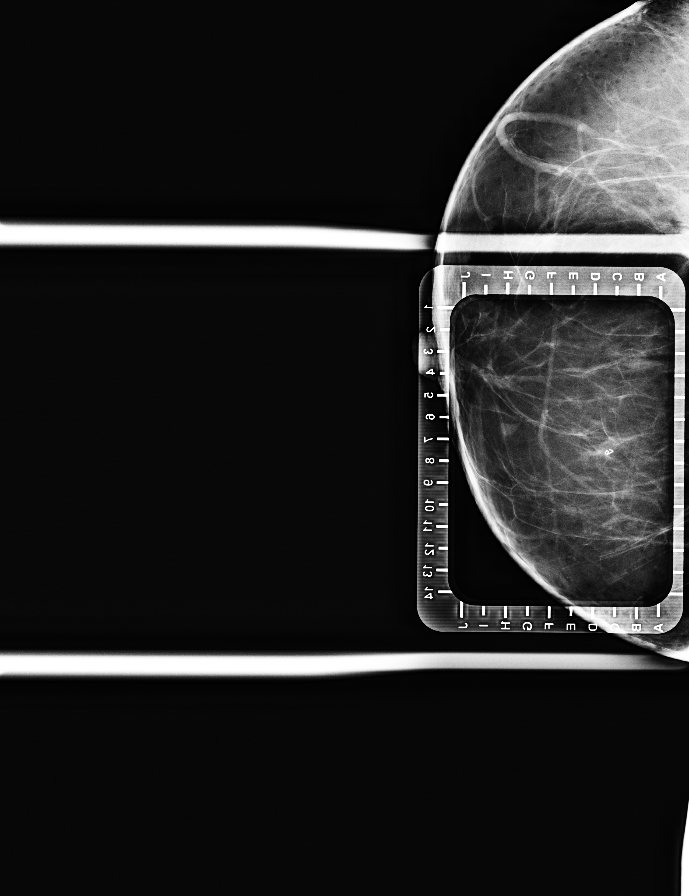

[R LM (3 of 3)]
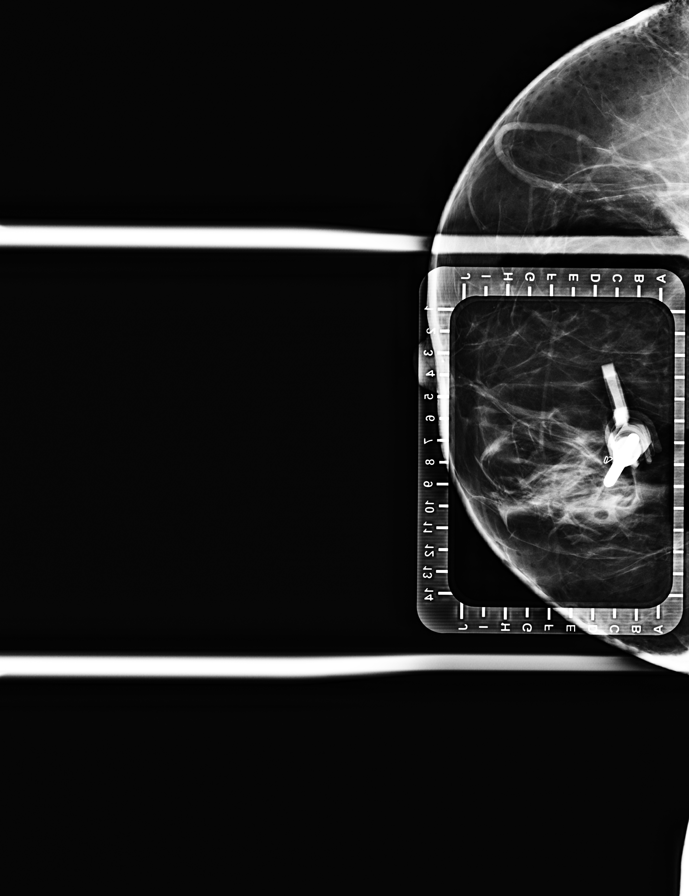

[8 of 8 positions shown; findings below may reference images not displayed]

FINDINGS: Patient presents for radiofrequency device localization prior to
right breast excisional biopsy. I met with the patient and we
discussed the procedure of radiofrequency device localization
including benefits and alternatives. We discussed the high
likelihood of a successful procedure. We discussed the risks of the
procedure including infection, bleeding, tissue injury and further
surgery. Informed, written consent was given.

The usual time-out protocol was performed immediately prior to the
procedure.

Using mammographic guidance, sterile technique, 1% lidocaine as
local anesthesia, a radiofrequency tag was used to localize a
heart-shaped post biopsy marker in the right 7 o'clock breast using
a lateral approach. The radiofrequency tag is located 9 mm inferior
to the post biopsy marker.

The follow-up mammogram images confirm that the RF device is in the
expected location and are marked for Dr. VROUKJE.

The patient tolerated the procedure well and was released from the
[REDACTED].
IMPRESSION: Radiofrequency device localization of the RIGHT breast. No apparent
complications.

## 2021-05-19 ENCOUNTER — Ambulatory Visit: Payer: 59 | Admitting: Certified Registered"

## 2021-05-19 ENCOUNTER — Encounter
Admission: RE | Disposition: A | Discharge status: Home / Self Care | Payer: Self-pay | Source: Home / Self Care | Attending: General Surgery

## 2021-05-19 ENCOUNTER — Encounter: Payer: Self-pay | Admitting: General Surgery

## 2021-05-19 ENCOUNTER — Ambulatory Visit
Admission: RE | Admit: 2021-05-19 | Discharge: 2021-05-19 | Disposition: A | Discharge status: Home / Self Care | Payer: 59 | Attending: General Surgery | Admitting: General Surgery

## 2021-05-19 ENCOUNTER — Ambulatory Visit
Admission: RE | Admit: 2021-05-19 | Discharge: 2021-05-19 | Disposition: A | Discharge status: Home / Self Care | Payer: 59 | Source: Ambulatory Visit | Attending: General Surgery | Admitting: General Surgery

## 2021-05-19 ENCOUNTER — Other Ambulatory Visit: Payer: Self-pay

## 2021-05-19 DIAGNOSIS — Z79899 Other long term (current) drug therapy: Secondary | ICD-10-CM | POA: Insufficient documentation

## 2021-05-19 DIAGNOSIS — N6081 Other benign mammary dysplasias of right breast: Secondary | ICD-10-CM | POA: Diagnosis not present

## 2021-05-19 DIAGNOSIS — D241 Benign neoplasm of right breast: Secondary | ICD-10-CM

## 2021-05-19 DIAGNOSIS — Z803 Family history of malignant neoplasm of breast: Secondary | ICD-10-CM | POA: Diagnosis not present

## 2021-05-19 DIAGNOSIS — N6002 Solitary cyst of left breast: Secondary | ICD-10-CM | POA: Insufficient documentation

## 2021-05-19 DIAGNOSIS — Z8572 Personal history of non-Hodgkin lymphomas: Secondary | ICD-10-CM | POA: Insufficient documentation

## 2021-05-19 DIAGNOSIS — Z791 Long term (current) use of non-steroidal anti-inflammatories (NSAID): Secondary | ICD-10-CM | POA: Insufficient documentation

## 2021-05-19 DIAGNOSIS — Z880 Allergy status to penicillin: Secondary | ICD-10-CM | POA: Insufficient documentation

## 2021-05-19 HISTORY — PX: EXCISION OF BREAST BIOPSY: SHX5822

## 2021-05-19 SURGERY — EXCISION OF BREAST BIOPSY
Anesthesia: General | Site: Breast | Laterality: Right

## 2021-05-19 MED ORDER — ACETAMINOPHEN 10 MG/ML IV SOLN
INTRAVENOUS | Status: DC | PRN
  Administered 2021-05-19: 1000 mg via INTRAVENOUS

## 2021-05-19 MED ORDER — ACETAMINOPHEN 10 MG/ML IV SOLN: 1000.0000 mg | Freq: Once | INTRAVENOUS | Status: DC | PRN

## 2021-05-19 MED ORDER — PROPOFOL 10 MG/ML IV BOLUS
INTRAVENOUS | Status: DC | PRN
  Administered 2021-05-19: 150 mg via INTRAVENOUS

## 2021-05-19 MED ORDER — HYDROCODONE-ACETAMINOPHEN 5-325 MG PO TABS: 1.0000 | ORAL_TABLET | ORAL | 0 refills | Status: AC | PRN

## 2021-05-19 MED ORDER — FAMOTIDINE 20 MG PO TABS
ORAL_TABLET | ORAL | Status: AC
  Administered 2021-05-19: 20 mg via ORAL
  Filled 2021-05-19: qty 1

## 2021-05-19 MED ORDER — HYDROMORPHONE HCL 1 MG/ML IJ SOLN: 0.2500 mg | INTRAMUSCULAR | Status: DC | PRN

## 2021-05-19 MED ORDER — LIDOCAINE HCL (CARDIAC) PF 100 MG/5ML IV SOSY
PREFILLED_SYRINGE | INTRAVENOUS | Status: DC | PRN
  Administered 2021-05-19: 60 mg via INTRAVENOUS

## 2021-05-19 MED ORDER — FENTANYL CITRATE (PF) 100 MCG/2ML IJ SOLN
INTRAMUSCULAR | Status: DC | PRN
  Administered 2021-05-19: 50 ug via INTRAVENOUS

## 2021-05-19 MED ORDER — CHLORHEXIDINE GLUCONATE 0.12 % MT SOLN
OROMUCOSAL | Status: AC
  Administered 2021-05-19: 15 mL via OROMUCOSAL
  Filled 2021-05-19: qty 15

## 2021-05-19 MED ORDER — OXYCODONE HCL 5 MG PO TABS: 5.0000 mg | ORAL_TABLET | Freq: Once | ORAL | Status: DC | PRN

## 2021-05-19 MED ORDER — FAMOTIDINE 20 MG PO TABS: 20.0000 mg | ORAL_TABLET | Freq: Once | ORAL | Status: AC

## 2021-05-19 MED ORDER — CEFAZOLIN SODIUM-DEXTROSE 2-4 GM/100ML-% IV SOLN
2.0000 g | INTRAVENOUS | Status: AC
  Administered 2021-05-19: 2 g via INTRAVENOUS

## 2021-05-19 MED ORDER — ACETAMINOPHEN 10 MG/ML IV SOLN
INTRAVENOUS | Status: AC
  Filled 2021-05-19: qty 100

## 2021-05-19 MED ORDER — ACETAMINOPHEN 160 MG/5ML PO SOLN
325.0000 mg | ORAL | Status: DC | PRN
  Filled 2021-05-19: qty 20.3

## 2021-05-19 MED ORDER — CHLORHEXIDINE GLUCONATE 0.12 % MT SOLN: 15.0000 mL | Freq: Once | OROMUCOSAL | Status: AC

## 2021-05-19 MED ORDER — FENTANYL CITRATE (PF) 100 MCG/2ML IJ SOLN
INTRAMUSCULAR | Status: AC
  Filled 2021-05-19: qty 2

## 2021-05-19 MED ORDER — PROPOFOL 10 MG/ML IV BOLUS
INTRAVENOUS | Status: AC
  Filled 2021-05-19: qty 20

## 2021-05-19 MED ORDER — BUPIVACAINE-EPINEPHRINE (PF) 0.5% -1:200000 IJ SOLN
INTRAMUSCULAR | Status: DC | PRN
  Administered 2021-05-19: 10 mL via PERINEURAL
  Administered 2021-05-19: 20 mL via PERINEURAL

## 2021-05-19 MED ORDER — ORAL CARE MOUTH RINSE: 15.0000 mL | Freq: Once | OROMUCOSAL | Status: AC

## 2021-05-19 MED ORDER — LIDOCAINE HCL (PF) 2 % IJ SOLN
INTRAMUSCULAR | Status: AC
  Filled 2021-05-19: qty 10

## 2021-05-19 MED ORDER — ACETAMINOPHEN 325 MG PO TABS: 325.0000 mg | ORAL_TABLET | ORAL | Status: DC | PRN

## 2021-05-19 MED ORDER — OXYCODONE HCL 5 MG/5ML PO SOLN: 5.0000 mg | Freq: Once | ORAL | Status: DC | PRN

## 2021-05-19 MED ORDER — LACTATED RINGERS IV SOLN: INTRAVENOUS | Status: DC

## 2021-05-19 MED ORDER — BUPIVACAINE-EPINEPHRINE (PF) 0.5% -1:200000 IJ SOLN
INTRAMUSCULAR | Status: AC
  Filled 2021-05-19: qty 30

## 2021-05-19 MED ORDER — ROCURONIUM BROMIDE 10 MG/ML (PF) SYRINGE
PREFILLED_SYRINGE | INTRAVENOUS | Status: AC
  Filled 2021-05-19: qty 10

## 2021-05-19 MED ORDER — EPHEDRINE SULFATE 50 MG/ML IJ SOLN
INTRAMUSCULAR | Status: DC | PRN
  Administered 2021-05-19: 5 mg via INTRAVENOUS

## 2021-05-19 MED ORDER — MEPERIDINE HCL 25 MG/ML IJ SOLN: 6.2500 mg | INTRAMUSCULAR | Status: DC | PRN

## 2021-05-19 MED ORDER — ONDANSETRON HCL 4 MG/2ML IJ SOLN
INTRAMUSCULAR | Status: DC | PRN
  Administered 2021-05-19: 4 mg via INTRAVENOUS

## 2021-05-19 MED ORDER — PROMETHAZINE HCL 25 MG/ML IJ SOLN: 12.5000 mg | Freq: Once | INTRAMUSCULAR | Status: DC | PRN

## 2021-05-19 MED ORDER — CEFAZOLIN SODIUM-DEXTROSE 2-4 GM/100ML-% IV SOLN
INTRAVENOUS | Status: AC
  Filled 2021-05-19: qty 100

## 2021-05-19 SURGICAL SUPPLY — 36 items
ADH SKN CLS APL DERMABOND .7 (GAUZE/BANDAGES/DRESSINGS) ×1
APL PRP STRL LF DISP 70% ISPRP (MISCELLANEOUS) ×1
BLADE SURG 15 STRL LF DISP TIS (BLADE) ×1 IMPLANT
BLADE SURG 15 STRL SS (BLADE) ×2
CHLORAPREP W/TINT 26 (MISCELLANEOUS) ×2 IMPLANT
DERMABOND ADVANCED (GAUZE/BANDAGES/DRESSINGS) ×1
DERMABOND ADVANCED .7 DNX12 (GAUZE/BANDAGES/DRESSINGS) ×1 IMPLANT
DEVICE DUBIN SPECIMEN MAMMOGRA (MISCELLANEOUS) ×2 IMPLANT
DRAPE LAPAROTOMY TRNSV 106X77 (MISCELLANEOUS) ×2 IMPLANT
ELECT CAUTERY BLADE TIP 2.5 (TIP) ×2
ELECT REM PT RETURN 9FT ADLT (ELECTROSURGICAL) ×2
ELECTRODE CAUTERY BLDE TIP 2.5 (TIP) ×1 IMPLANT
ELECTRODE REM PT RTRN 9FT ADLT (ELECTROSURGICAL) ×1 IMPLANT
GAUZE 4X4 16PLY ~~LOC~~+RFID DBL (SPONGE) ×2 IMPLANT
GLOVE SURG ENC MOIS LTX SZ6.5 (GLOVE) ×2 IMPLANT
GLOVE SURG UNDER POLY LF SZ6.5 (GLOVE) ×2 IMPLANT
GOWN STRL REUS W/ TWL LRG LVL3 (GOWN DISPOSABLE) ×2 IMPLANT
GOWN STRL REUS W/TWL LRG LVL3 (GOWN DISPOSABLE) ×4
KIT MARKER MARGIN INK (KITS) ×2 IMPLANT
KIT TURNOVER KIT A (KITS) ×2 IMPLANT
MANIFOLD NEPTUNE II (INSTRUMENTS) ×2 IMPLANT
MARKER MARGIN CORRECT CLIP (MARKER) ×2 IMPLANT
NEEDLE HYPO 25X1 1.5 SAFETY (NEEDLE) ×2 IMPLANT
PACK BASIN MINOR ARMC (MISCELLANEOUS) ×2 IMPLANT
SUT ETHILON 3-0 FS-10 30 BLK (SUTURE) ×2
SUT MNCRL 4-0 (SUTURE) ×2
SUT MNCRL 4-0 27XMFL (SUTURE) ×1
SUT SILK 2 0 SH (SUTURE) ×2 IMPLANT
SUT VIC AB 3-0 SH 27 (SUTURE) ×2
SUT VIC AB 3-0 SH 27X BRD (SUTURE) ×1 IMPLANT
SUTURE EHLN 3-0 FS-10 30 BLK (SUTURE) ×1 IMPLANT
SUTURE MNCRL 4-0 27XMF (SUTURE) ×1 IMPLANT
SYR 10ML LL (SYRINGE) ×2 IMPLANT
SYR BULB IRRIG 60ML STRL (SYRINGE) ×2 IMPLANT
WATER STERILE IRR 1000ML POUR (IV SOLUTION) ×2 IMPLANT
WATER STERILE IRR 500ML POUR (IV SOLUTION) ×2 IMPLANT

## 2021-05-19 NOTE — Anesthesia Postprocedure Evaluation (Signed)
Anesthesia Post Note  Patient: Danielle Lewis  Procedure(s) Performed: EXCISION OF BREAST BIOPSY w/ Radio frequency tag (Right: Breast)  Patient location during evaluation: PACU Anesthesia Type: General Level of consciousness: awake and alert Pain management: pain level controlled Vital Signs Assessment: post-procedure vital signs reviewed and stable Respiratory status: spontaneous breathing, nonlabored ventilation, respiratory function stable and patient connected to nasal cannula oxygen Cardiovascular status: blood pressure returned to baseline and stable Postop Assessment: no apparent nausea or vomiting Anesthetic complications: no   No notable events documented.   Last Vitals:  Vitals:   05/19/21 1114 05/19/21 1129  BP: 128/70 (!) 159/71  Pulse: 87 81  Resp: 15 18  Temp: (!) 36.4 C (!) 36.2 C  SpO2: 100% 95%    Last Pain:  Vitals:   05/19/21 1129  TempSrc: Temporal  PainSc: 0-No pain                 [place]

## 2021-05-19 NOTE — Interval H&P Note (Signed)
History and Physical Interval Note:  05/19/2021 9:33 AM  Danielle Lewis  has presented today for surgery, with the diagnosis of D24.1 Intraductal papilloma of breast, rt.  The various methods of treatment have been discussed with the patient and family. After consideration of risks, benefits and other options for treatment, the patient has consented to  Procedure(s): EXCISION OF BREAST BIOPSY w/ Radio frequency tag (Right) as a surgical intervention.  The patient's history has been reviewed, patient examined, no change in status, stable for surgery.  I have reviewed the patient's chart and labs.  Questions were answered to the patient's satisfaction.     Herbert Pun

## 2021-05-19 NOTE — Discharge Instructions (Addendum)
  Diet: Resume home heart healthy regular diet.   Activity: Increase activity at tolerated. Light activity and walking are encouraged. Do not drive or drink alcohol if taking narcotic pain medications.  Wound care: May shower with soapy water and pat dry (do not rub incisions), but no baths or submerging incision underwater until follow-up. (no swimming)   Medications: Resume all home medications. For mild to moderate pain: acetaminophen (Tylenol) or ibuprofen (if no kidney disease). Combining Tylenol with alcohol can substantially increase your risk of causing liver disease. Narcotic pain medications, if prescribed, can be used for severe pain, though may cause nausea, constipation, and drowsiness. Do not combine Tylenol and Norco within a 6 hour period as Norco contains Tylenol. If you do not need the narcotic pain medication, you do not need to fill the prescription.  Call office [phone]) at any time if any questions, worsening pain, fevers/chills, bleeding, drainage from incision site, or other concerns.   AMBULATORY SURGERY  DISCHARGE INSTRUCTIONS   The drugs that you were given will stay in your system until tomorrow so for the next 24 hours you should not:  Drive an automobile Make any legal decisions Drink any alcoholic beverage   You may resume regular meals tomorrow.  Today it is better to start with liquids and gradually work up to solid foods.  You may eat anything you prefer, but it is better to start with liquids, then soup and crackers, and gradually work up to solid foods.   Please notify your doctor immediately if you have any unusual bleeding, trouble breathing, redness and pain at the surgery site, drainage, fever, or pain not relieved by medication.    Your post-operative visit with Dr.                                       is: Date:                        Time:    Please call to schedule your post-operative visit.  Additional Instructions:

## 2021-05-19 NOTE — Anesthesia Procedure Notes (Signed)
Procedure Name: LMA Insertion Date/Time: 05/19/2021 9:55 AM Performed by: Johney Maine, CRNA Pre-anesthesia Checklist: Patient identified, Patient being monitored, Timeout performed, Emergency Drugs available and Suction available Patient Re-evaluated:Patient Re-evaluated prior to induction Oxygen Delivery Method: Circle system utilized Preoxygenation: Pre-oxygenation with 100% oxygen Induction Type: IV induction Ventilation: Mask ventilation without difficulty LMA: LMA inserted LMA Size: 4.0 Tube type: Oral Number of attempts: 1 Placement Confirmation: positive ETCO2 and breath sounds checked- equal and bilateral Tube secured with: Tape Dental Injury: Teeth and Oropharynx as per pre-operative assessment

## 2021-05-19 NOTE — Anesthesia Preprocedure Evaluation (Addendum)
Anesthesia Evaluation  Patient identified by MRN, date of birth, ID band Patient awake    Reviewed: Allergy & Precautions, NPO status , Patient's Chart, lab work & pertinent test results  History of Anesthesia Complications Negative for: history of anesthetic complications  Airway Mallampati: I  TM Distance: >3 FB Neck ROM: Full    Dental   Pulmonary neg pulmonary ROS,    Pulmonary exam normal        Cardiovascular hypertension, Normal cardiovascular exam  Known LBBB   Neuro/Psych  Headaches, Anxiety    GI/Hepatic GERD  Medicated and Controlled,Fatty liver    Endo/Other  negative endocrine ROS  Renal/GU negative Renal ROS  negative genitourinary   Musculoskeletal  (+) Arthritis ,   Abdominal   Peds negative pediatric ROS (+)  Hematology Follicular Lymphoma Intraductal papilloma right breast   Anesthesia Other Findings EKG 05/11/21 NSR with 1st degree AVB, LBBB  Reproductive/Obstetrics negative OB ROS                            Anesthesia Physical Anesthesia Plan  ASA: 3  Anesthesia Plan: General   Post-op Pain Management:    Induction:   PONV Risk Score and Plan: 2 and Ondansetron and Dexamethasone  Airway Management Planned: LMA  Additional Equipment:   Intra-op Plan:   Post-operative Plan:   Informed Consent: I have reviewed the patients History and Physical, chart, labs and discussed the procedure including the risks, benefits and alternatives for the proposed anesthesia with the patient or authorized representative who has indicated his/her understanding and acceptance.       Plan Discussed with: CRNA  Anesthesia Plan Comments: ( )       Anesthesia Quick Evaluation

## 2021-05-19 NOTE — Transfer of Care (Signed)
Immediate Anesthesia Transfer of Care Note  Patient: Danielle Lewis  Procedure(s) Performed: EXCISION OF BREAST BIOPSY w/ Radio frequency tag (Right: Breast)  Patient Location: PACU  Anesthesia Type:General  Level of Consciousness: awake, alert  and oriented  Airway & Oxygen Therapy: Patient Spontanous Breathing and Patient connected to face mask oxygen  Post-op Assessment: Report given to RN and Post -op Vital signs reviewed and stable  Post vital signs: Reviewed and stable  Last Vitals:  Vitals Value Taken Time  BP 128/70 05/19/21 1037  Temp    Pulse 87 05/19/21 1040  Resp 15 05/19/21 1044  SpO2 100 % 05/19/21 1040  Vitals shown include unvalidated device data.  Last Pain:  Vitals:   05/19/21 0917  TempSrc: Temporal  PainSc: 0-No pain         Complications: No notable events documented.

## 2021-05-19 NOTE — Op Note (Signed)
Preoperative diagnosis: Right intraductal papilloma.  Postoperative diagnosis: Same.   Procedure: Right radiofrequency tag-localized excisional biopsy.                       Anesthesia: GETA  Surgeon: Dr. Windell Moment  Wound Classification: Clean  Indications: Patient is a 65 y.o. female with a nonpalpable right breast mass noted on mammography with core biopsy demonstrating intraductal papilloma requires radiofrequency tag-localized excisional biopsy to rule out malignancy.   Findings: 1. Specimen mammography shows marker and tag on specimen 2. No other palpable mass or lymph node identified.   Description of procedure: Preoperative radiofrequency tag localization was performed by radiology. Localization studies were reviewed. The patient was taken to the operating room and placed supine on the operating table, and after general anesthesia the right chest and axilla were prepped and draped in the usual sterile fashion. A time-out was completed verifying correct patient, procedure, site, positioning, and implant(s) and/or special equipment prior to beginning this procedure.  By comparing the localization studies and interrogation with Localizer device, the probable trajectory and location of the mass was visualized. A circumareolar skin incision was planned in such a way as to minimize the amount of dissection to reach the mass.  The skin incision was made. Flaps were raised and the location of the tag was confirmed with Localizer device confirmed. A 2-0 silk figure-of-eight stay suture was placed and used for retraction. Dissection was then taken down circumferentially, taking care to include the entire localizing tag and a wide margin of grossly normal tissue. The specimen and entire localizing tag were removed. The specimen was oriented and sent to radiology with the localization studies. Confirmation was received that the entire target lesion had been resected. The wound was irrigated.  Hemostasis was checked. The wound was closed with interrupted sutures of 3-0 Vicryl and a subcuticular suture of Monocryl 4-0. No attempt was made to close the dead space.  The patient tolerated the procedure well and was taken to the postanesthesia care unit in stable condition.    Specimen: Right Breast excisional biopsy                      Complications: None  Estimated Blood Loss: 5 mL

## 2021-05-20 ENCOUNTER — Encounter: Payer: Self-pay | Admitting: General Surgery

## 2021-05-22 LAB — SURGICAL PATHOLOGY

## 2021-05-24 ENCOUNTER — Other Ambulatory Visit: Payer: 59

## 2021-06-06 ENCOUNTER — Ambulatory Visit: Payer: 59 | Admitting: Obstetrics and Gynecology

## 2021-06-08 ENCOUNTER — Ambulatory Visit: Payer: 59

## 2021-06-08 ENCOUNTER — Ambulatory Visit: Payer: 59 | Admitting: Obstetrics and Gynecology

## 2021-06-08 DIAGNOSIS — N83201 Unspecified ovarian cyst, right side: Secondary | ICD-10-CM

## 2021-06-09 ENCOUNTER — Ambulatory Visit: Payer: 59

## 2021-06-12 ENCOUNTER — Ambulatory Visit: Payer: 59 | Admitting: Obstetrics and Gynecology

## 2021-06-15 ENCOUNTER — Other Ambulatory Visit: Payer: Self-pay

## 2021-06-15 ENCOUNTER — Other Ambulatory Visit: Payer: Self-pay | Admitting: Obstetrics and Gynecology

## 2021-06-15 ENCOUNTER — Ambulatory Visit (INDEPENDENT_AMBULATORY_CARE_PROVIDER_SITE_OTHER): Payer: 59

## 2021-06-15 DIAGNOSIS — N83201 Unspecified ovarian cyst, right side: Secondary | ICD-10-CM

## 2021-06-19 ENCOUNTER — Ambulatory Visit (INDEPENDENT_AMBULATORY_CARE_PROVIDER_SITE_OTHER): Payer: 59 | Admitting: Obstetrics and Gynecology

## 2021-06-19 ENCOUNTER — Other Ambulatory Visit: Payer: Self-pay

## 2021-06-19 ENCOUNTER — Encounter: Payer: Self-pay | Admitting: Obstetrics and Gynecology

## 2021-06-19 VITALS — BP 162/90 | Ht 63.0 in | Wt 197.0 lb

## 2021-06-19 DIAGNOSIS — N83201 Unspecified ovarian cyst, right side: Secondary | ICD-10-CM

## 2021-06-19 NOTE — Progress Notes (Signed)
Gynecology Ultrasound Follow Up  Chief Complaint:  Chief Complaint  Patient presents with   Follow-up    Follow up ultrasound - RM 5     History of Present Illness: Patient is a 65 y.o. female who presents today for ultrasound evaluation of right ovarian cyst.  Ultrasound demonstrates the following findgins Adnexa: Simple 2 cm right ovarian cyst, overall stable in dimension from initial CT scan Uterus: Surgically absent Additional: no free fluid  Review of Systems: Review of Systems  Constitutional: Negative.   Gastrointestinal: Negative.   Genitourinary: Negative.    Past Medical History:  Past Medical History:  Diagnosis Date   Anxiety    Arthritis    Cancer ([place])    COVID-19 2021   GERD (gastroesophageal reflux disease)    Headache    Hypertension    Pneumonia     Past Surgical History:  Past Surgical History:  Procedure Laterality Date   ABDOMINAL HYSTERECTOMY     APPENDECTOMY     BREAST BIOPSY Right 04/28/2021   U/s bx 7:00/2cmfn"heart" marker-path pending   CHOLECYSTECTOMY     28years ago   DILATION AND CURETTAGE OF UTERUS     EXCISION OF BREAST BIOPSY Right 05/19/2021   Procedure: EXCISION OF BREAST BIOPSY w/ Radio frequency tag;  Surgeon: Herbert Pun, MD;  Location: [facility] ORS;  Service: General;  Laterality: Right;   LYMPH GLAND EXCISION Right 01/22/2020   Procedure: CERVICAL LYMPH GLAND EXCISION;  Surgeon: Herbert Pun, MD;  Location: [facility] OR[facility];  Service: General;  Laterality: Right;   PORTACATH PLACEMENT N/A 02/24/2020   Procedure: INSERTION PORT-A-CATH;  Surgeon: Herbert Pun, MD;  Location: [facility] ORS;  Service: General;  Laterality: N/A;   TONSILLECTOMY      Gynecologic History:  No LMP recorded. Patient is postmenopausal. Contraception: status post hysterectomy Last Pap: N/A  Family History:  Family History  Problem Relation Age of Onset   Breast cancer Mother    Cancer Mother        unknown type    Social  History:  Social History   Socioeconomic History   Marital status: Married    Spouse name: Not on file   Number of children: Not on file   Years of education: Not on file   Highest education level: Not on file  Occupational History   Not on file  Tobacco Use   Smoking status: Never   Smokeless tobacco: Never  Vaping Use   Vaping Use: Never used  Substance and Sexual Activity   Alcohol use: Yes    Comment: occassional   Drug use: Not Currently   Sexual activity: Yes    Birth control/protection: Surgical    Comment: Tubal  Other Topics Concern   Not on file  Social History Narrative   Lives in [place]; [place]. Never smoked; wine rarely. Used in work in [employer]. With husband.    Social Determinants of Health   Financial Resource Strain: Not on file  Food Insecurity: Not on file  Transportation Needs: Not on file  Physical Activity: Not on file  Stress: Not on file  Social Connections: Not on file  Intimate Partner Violence: Not on file    Allergies:  Allergies  Allergen Reactions   Penicillins Rash    Medications: Prior to Admission medications   Medication Sig Start Date End Date Taking? Authorizing Provider  ALPRAZolam Duanne Moron) 0.5 MG tablet Take 0.5 mg by mouth in the morning and at bedtime. 06/06/20  Yes [provider]  amLODipine (NORVASC) 5 MG tablet Take 1 tablet (5 mg total) by mouth daily. Patient taking differently: Take 2.5 mg by mouth every evening. 03/24/21  Yes Cammie Sickle, MD  escitalopram (LEXAPRO) 20 MG tablet Take 20 mg by mouth at bedtime. 04/05/21  Yes [provider]  naproxen (NAPROSYN) 500 MG tablet Take 500 mg by mouth 2 (two) times daily as needed (pain). 11/18/20  Yes [provider]  ondansetron (ZOFRAN) 4 MG tablet TAKE 1 TABLET BY MOUTH EVERY 8 HOURS AS NEEDED FOR NAUSEA FOR VOMITING 07/27/20  Yes Cammie Sickle, MD  zolpidem (AMBIEN CR) 6.25 MG CR tablet Take 1 tablet (6.25 mg total) by  mouth at bedtime as needed. for sleep 03/24/21  Yes Cammie Sickle, MD  acyclovir (ZOVIRAX) 400 MG tablet Take 1 tablet by mouth once daily Patient not taking: No sig reported 03/14/21   Cammie Sickle, MD  cetirizine (ZYRTEC) 10 MG tablet Take 10 mg by mouth 2 (two) times daily. 04/07/21   [provider]  diclofenac Sodium (VOLTAREN) 1 % GEL Apply 2 g topically 4 (four) times daily. 11/08/20 11/08/21  [provider]  meclizine (ANTIVERT) 25 MG tablet Take 25 mg by mouth 3 (three) times daily as needed. 04/05/21   [provider]    Physical Exam Vitals: Blood pressure (!) 162/90, height 5\' 3"  (1.6 m), weight 197 lb (89.4 kg).  General: NAD HEENT: normocephalic, anicteric Pulmonary: No increased work of breathing Extremities: no edema, erythema, or tenderness Neurologic: Grossly intact, normal gait Psychiatric: mood appropriate, affect full  ULTRASOUND REPORT   Location: [facility]  Date of Service: 06/15/2021     Ultrasound 06/15/2021 Indications: Right Ovarian cyst; S/P hysterectomy   Findings:  The uterus is surgically absent.   Right Ovary measures 2.2 x 4.1 x 2.8 cm. It contains a simple cyst measuring 2.0 x 2.4 x 2.1 cm.  Left Ovary is not visualized. Survey of the adnexa demonstrates no adnexal masses. There is no free fluid in the cul de sac.   Impression: 1. Right Ovarian cyst.   Recommendations: 1.Clinical correlation with the patient's History and Physical Exam.   Assessment: 65 y.o. G10P0 simple right ovarian cyst  Plan: Problem List Items Addressed This Visit   None Visit Diagnoses     Right ovarian cyst    -  Primary       1) ROV cyst - interval ultrasound following initial CT scan 04/05/2021 reveals stability in size, simple appearance without concerning features.    1. Cysts ?1 cm: Are clinically inconsequential; at the discretion of the interpreting physician whether or not to describe them in the  imaging report; do not need follow-up. 2. Cysts >1 and ?7 cm: Should be described in the imaging report with statement that they are almost certainly benign; yearly follow-up, at least initially, with [place] recommended. Some practices may opt to increase the lower size threshold for follow-up from 1 cm to as high as 3 cm. One may opt to continue follow-up annually or to decrease the frequency of follow-up once stability or decrease in size has been confirmed. Cysts in the larger end of this range should still generally be followed on a regular basis. 3. Cysts >7 cm: Since these may be difficult to assess completely with [place], further imaging with MR or surgical evaluation should be considered.  Gordy Levan et al. Management of Asymptomatic Ovarian and Other Adnexal Cysts Imaged at [place]: Society of Radiologists  in Ultrasound Consensus Conference Statement 2010. Radiology 256 (Sept 2010): 943-954.). schedule laparosocpic bso   - we discussed expectant management, repeat surveillance ultrasound in 6 months, or removal.  Patient is concerned given her prior history of Lympoma and would like to proceed with laproscopic BSO.     2) A total of 20 minutes were spent in face-to-face contact with the patient during this encounter with over half of that time devoted to counseling and coordination of care.  3) Return will be called with surgery date.   Malachy Mood, MD, [facility], [facility] 06/19/2021, 11:36 AM

## 2021-06-23 ENCOUNTER — Telehealth: Payer: Self-pay

## 2021-06-23 NOTE — Telephone Encounter (Signed)
-----   Message from Malachy Mood, MD sent at 06/19/2021  8:56 PM EDT ----- Regarding: surgery Surgery Booking Request Patient Full Name:  Danielle Lewis  MRN: [id number]  DOB: 1956/07/05  Surgeon: Malachy Mood, MD  Requested Surgery Date and Time: next 6weeks Primary Diagnosis AND Code: Right ovarian cyst Secondary Diagnosis and Code:  Surgical Procedure: laparoscopic bilateral salpingo-oophorectomy RNFA Requested?: No L&D Notification: No Admission Status: same day surgery Length of Surgery: 50 min Special Case Needs: No H&P: Yes Phone Interview???:  No Interpreter: No Medical Clearance:  Yes NEEDS CARDIOLOGY CLEARANCE Special Scheduling Instructions: No Any known health/anesthesia issues, diabetes, sleep apnea, latex allergy, defibrillator/pacemaker?: No Acuity: P2   (P1 highest, P2 delay may cause harm, P3 low, elective gyn, P4 lowest) Post op follow up visits: 1 week and 6 week

## 2021-06-23 NOTE — Telephone Encounter (Signed)
Called patient to schedule Laparoscopic bilateral salpingo-oophorectomy w Georgianne Fick  DOS 11/10  H&P 11/9 @ 2:10 in [place] phone call appointment to be requested - date and time will be included on H&P paper work. Also all appointments will be updated on pt MyChart. Explained that this appointment has a call window. Based on the time scheduled will indicate if the call will be received within a 4 hour window before 1:00 or after.  Advised that pt may also receive calls from the hospital pharmacy and pre-service center.  Confirmed pt has [employer] as primary insurance. No secondary insurance.

## 2021-07-03 ENCOUNTER — Other Ambulatory Visit
Admission: RE | Admit: 2021-07-03 | Discharge: 2021-07-03 | Disposition: A | Discharge status: Home / Self Care | Payer: 59 | Source: Ambulatory Visit | Attending: Obstetrics and Gynecology | Admitting: Obstetrics and Gynecology

## 2021-07-03 ENCOUNTER — Other Ambulatory Visit: Payer: Self-pay

## 2021-07-03 VITALS — Ht 64.0 in | Wt 200.0 lb

## 2021-07-03 DIAGNOSIS — Z01812 Encounter for preprocedural laboratory examination: Secondary | ICD-10-CM

## 2021-07-03 HISTORY — DX: Depression, unspecified: F32.A

## 2021-07-03 LAB — TYPE AND SCREEN
ABO/RH(D): O POS
Antibody Screen: NEGATIVE

## 2021-07-03 LAB — CBC
HCT: 42.3 % (ref 36.0–46.0)
Hemoglobin: 14.6 g/dL (ref 12.0–15.0)
MCH: 31.6 pg (ref 26.0–34.0)
MCHC: 34.5 g/dL (ref 30.0–36.0)
MCV: 91.6 fL (ref 80.0–100.0)
Platelets: 206 10*3/uL (ref 150–400)
RBC: 4.62 MIL/uL (ref 3.87–5.11)
RDW: 13 % (ref 11.5–15.5)
WBC: 5.1 10*3/uL (ref 4.0–10.5)
nRBC: 0 % (ref 0.0–0.2)

## 2021-07-03 NOTE — Patient Instructions (Addendum)
Your procedure is scheduled on: Thursday July 06, 2021. Report to Day Surgery inside [facility] 2nd floor. To find out your arrival time please call [phone] between 1PM - 3PM on Wednesday 9, 2022.  Remember: Instructions that are not followed completely may result in serious medical risk,  up to and including death, or upon the discretion of your surgeon and anesthesiologist your  surgery may need to be rescheduled.     _X__ 1. Do not eat food or drink fluids after midnight the night before your procedure.                 No chewing gum or hard candies.   __X__2.  On the morning of surgery brush your teeth with toothpaste and water, you                may rinse your mouth with mouthwash if you wish.  Do not swallow any toothpaste or mouthwash.     _X__ 3.  No Alcohol for 24 hours before or after surgery.   _X__ 4.  Do Not Smoke or use e-cigarettes For 24 Hours Prior to Your Surgery.                 Do not use any chewable tobacco products for at least 6 hours prior to                 Surgery.  _X__  5.  Do not use any recreational drugs (marijuana, cocaine, heroin, ecstasy, MDMA or other)                For at least one week prior to your surgery.  Combination of these drugs with anesthesia                May have life threatening results.  __X__6.  Notify your doctor if there is any change in your medical condition      (cold, fever, infections).     Do not wear jewelry, make-up, hairpins, clips or nail polish. Do not wear lotions, powders, or perfumes. You may wear deodorant. Do not shave 48 hours prior to surgery.  Do not bring valuables to the hospital.    [facility] is not responsible for any belongings or valuables.  Contacts, dentures or bridgework may not be worn into surgery. Leave your suitcase in the car. After surgery it may be brought to your room. For patients admitted to the hospital, discharge time is determined by  your treatment team.   Patients discharged the day of surgery will not be allowed to drive home.   Make arrangements for someone to be with you for the first 24 hours of your Same Day Discharge.   __X__ Take these medicines the morning of surgery with A SIP OF WATER:    1. ALPRAZolam (XANAX) 0.5 MG   2. omeprazole (PRILOSEC) 20 MG  3.   4.  5.  6.  ____ Fleet Enema (as directed)   __X__ Use CHG Soap (or wipes) as directed  ____ Use Benzoyl Peroxide Gel as instructed  ____ Use inhalers on the day of surgery  ____ Stop metformin 2 days prior to surgery    ____ Take 1/2 of usual insulin dose the night before surgery. No insulin the morning          of surgery.   ____ Call your PCP, cardiologist, or Pulmonologist if taking Coumadin/Plavix/aspirin and ask when to stop before your surgery.   __X__  One Week prior to surgery- Stop Anti-inflammatories such as Ibuprofen, Aleve, Advil, Motrin, meloxicam (MOBIC), diclofenac, etodolac, ketorolac, Toradol, Daypro, piroxicam, Goody's or BC powders. OK TO USE TYLENOL IF NEEDED   __X__ Stop supplements until after surgery.    ____ Bring C-Pap to the hospital.    If you have any questions regarding your pre-procedure instructions,  Please call Pre-admit Testing at [phone].

## 2021-07-05 ENCOUNTER — Encounter: Payer: Self-pay | Admitting: Obstetrics and Gynecology

## 2021-07-05 ENCOUNTER — Ambulatory Visit (INDEPENDENT_AMBULATORY_CARE_PROVIDER_SITE_OTHER): Payer: 59 | Admitting: Obstetrics and Gynecology

## 2021-07-05 ENCOUNTER — Other Ambulatory Visit: Payer: Self-pay

## 2021-07-05 VITALS — BP 182/82 | HR 90 | Ht 63.0 in | Wt 198.0 lb

## 2021-07-05 DIAGNOSIS — Z01818 Encounter for other preprocedural examination: Secondary | ICD-10-CM

## 2021-07-05 LAB — BASIC METABOLIC PANEL
Anion gap: 16 — ABNORMAL HIGH (ref 5–15)
BUN: 17 mg/dL (ref 8–23)
CO2: 25 mmol/L (ref 22–32)
Calcium: 8.6 mg/dL — ABNORMAL LOW (ref 8.9–10.3)
Chloride: 98 mmol/L (ref 98–111)
Creatinine, Ser: 0.99 mg/dL (ref 0.44–1.00)
GFR, Estimated: >60 mL/min (ref >60)
Glucose, Bld: 107 mg/dL — ABNORMAL HIGH (ref 70–99)
Potassium: 3.3 mmol/L — ABNORMAL LOW (ref 3.5–5.1)
Sodium: 139 mmol/L (ref 135–145)

## 2021-07-05 NOTE — Progress Notes (Signed)
Obstetrics & Gynecology Surgery H&P    Chief Complaint: Scheduled Surgery   History of Present Illness: Patient is a 65 y.o. G10P0 presenting for scheduled laparoscopic BSO, for the treatment or further evaluation of right ovarian cyst.   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[place]) 01/29/2020   Left bundle branch block (LBBB) on electrocardiogram 01/20/2020   Generalized enlarged lymph nodes 01/11/2020    Past Surgical History:  Past Surgical History:  Procedure Laterality Date   ABDOMINAL HYSTERECTOMY     APPENDECTOMY     BREAST BIOPSY Right 04/28/2021   U/s bx 7:00/2cmfn"heart" marker-path pending   CHOLECYSTECTOMY     28years ago   [place] [facility] BREAST BIOPSY Right 05/19/2021   Procedure: EXCISION OF BREAST BIOPSY w/ Radio frequency tag;  Surgeon: Herbert Pun, MD;  Location: [facility] ORS;  Service: General;  Laterality: Right;   LYMPH GLAND EXCISION Right 01/22/2020   Procedure: CERVICAL LYMPH GLAND EXCISION;  Surgeon: Herbert Pun, MD;  Location: [facility] ORS;  Service: General;  Laterality: Right;   PORTACATH PLACEMENT N/A 02/24/2020   Procedure: INSERTION PORT-A-CATH;  Surgeon: Herbert Pun, MD;  Location: [facility] ORS;  Service: General;  Laterality: N/A;   TONSILLECTOMY      Family History:  Family History  Problem Relation Age of Onset   Breast  cancer Mother    Cancer Mother        unknown type    Social History:  Social History   Socioeconomic History   Marital status: Married    Spouse name: Not on file   Number of children: Not on file   Years of education: Not on file   Highest education level: Not on file  Occupational History   Not on file  Tobacco Use   Smoking status: Never   Smokeless tobacco: Never  Vaping Use   Vaping Use: Never used  Substance and Sexual Activity   Alcohol use: Yes    Alcohol/week: 1.0 standard drink    Types: 1 Glasses of wine per week    Comment: rarely   Drug use: Not Currently   Sexual activity: Yes    Birth control/protection: Surgical    Comment: Tubal  Other Topics Concern   Not on file  Social History Narrative   Lives in [place]; [place]. Never smoked; wine rarely. Used in work in [employer]. With husband.    Social Determinants of Health   Financial Resource Strain: Not on file  Food Insecurity: Not on file  Transportation Needs: Not on file  Physical Activity: Not on file  Stress: Not on file  Social Connections: Not on file  Intimate Partner Violence: Not on file    Allergies:  Allergies  Allergen Reactions   Penicillins Rash    Medications: Prior to Admission medications   Medication Sig Start Date End Date Taking? Authorizing Provider  ALPRAZolam Duanne Moron) 0.5 MG tablet Take 0.5 mg by mouth in  the morning and at bedtime. 06/06/20  Yes [provider]  amLODipine (NORVASC) 5 MG tablet Take 1 tablet (5 mg total) by mouth daily. Patient taking differently: Take 5 mg by mouth at bedtime. 03/24/21  Yes Cammie Sickle, MD  escitalopram (LEXAPRO) 20 MG tablet Take 20 mg by mouth at bedtime. 04/05/21  Yes [provider]  naproxen (NAPROSYN) 500 MG tablet Take 500 mg by mouth 2 (two) times daily. 11/18/20  Yes [provider]  omeprazole (PRILOSEC) 20 MG capsule Take 20 mg by mouth daily.   Yes [provider]  zolpidem  (AMBIEN CR) 6.25 MG CR tablet Take 1 tablet (6.25 mg total) by mouth at bedtime as needed. for sleep 03/24/21  Yes Cammie Sickle, MD  acyclovir (ZOVIRAX) 400 MG tablet Take 1 tablet by mouth once daily Patient not taking: No sig reported 03/14/21   Cammie Sickle, MD  cetirizine (ZYRTEC) 10 MG tablet Take 10 mg by mouth daily as needed for allergies. Patient not taking: No sig reported 04/07/21   [provider]  diclofenac Sodium (VOLTAREN) 1 % GEL Apply 2 g topically daily as needed (pain). Patient not taking: No sig reported 11/08/20 11/08/21  [provider]  ondansetron (ZOFRAN) 4 MG tablet TAKE 1 TABLET BY MOUTH EVERY 8 HOURS AS NEEDED FOR NAUSEA FOR VOMITING Patient not taking: No sig reported 07/27/20   Cammie Sickle, MD    Physical Exam Vitals: Blood pressure (!) 182/82, pulse 90, height 5\' 3"  (1.6 m), weight 198 lb (89.8 kg). General: NAD HEENT: normocephalic, anicteric Pulmonary: No increased work of breathing, CTAB Cardiovascular: RRR, distal pulses 2+ Abdomen: soft, non-tender, non-distended Genitourinary: deferred Extremities: no edema, erythema, or tenderness Neurologic: Grossly intact Psychiatric: mood appropriate, affect full  Imaging No results found.  Assessment: 65 y.o. G10P0 presenting for scheduled laparoscopic BSO  Plan: 1) I have had a careful discussion with this patient about all the options available and the risk/benefits of each. I have fully informed this patient that a laparoscopy may subject her to a variety of discomforts and risks: She understands that most patients have surgery with little difficulty, but problems can happen ranging from minor to fatal. These include nausea, vomiting, pain, bleeding, infection, poor healing, hernia, or formation of adhesions. Unexpected reactions may occur from any drug or anesthetic given. Unintended injury may occur to other pelvic or abdominal structures such as Fallopian tubes,  ovaries, bladder, ureter (tube from kidney to bladder), or bowel. Nerves going from the pelvis to the legs may be injured. Any such injury may require immediate or later additional surgery to correct the problem. Excessive blood loss requiring transfusion is very unlikely but possible. Dangerous blood clots may form in the legs or lungs. Physical and sexual activity will be restricted in varying degrees for an indeterminate period of time but most often 2-4 weeks. She understands that the plan is to do this laparoscopically, however, there is a chance that this will need to be performed via a larger incision. Finally, she understands that it is impossible to list every possible undesirable effect and that the condition for which surgery is done is not always cured or significantly improved, and in rare cases may be even worsen. Ample time was given to answer all questions. - we had previously documented stability of the ovarian cyst as well as no concerning findings.  Discussion was had that this cyst is most certainly benign.  However the patient is concerned given her  personal cancer history and would like to proceed with surgical removal.  Given her age we discussed pros and cons of removing the contralateral ovary in ovarian cancer risk reduction.  She is aware that lifetime risk for the average woman living to age 48 for ovarian cancer is only about 1.3%.  She elected to proceed with bilateral salpingo-oophorectomy  2) Routine postoperative instructions were reviewed with the patient and her family in detail today including the expected length of recovery and likely postoperative course.  The patient concurred with the proposed plan, giving informed written consent for the surgery today.  Patient instructed on the importance of being NPO after midnight prior to her procedure.  If warranted preoperative prophylactic antibiotics and SCDs ordered on call to the OR to meet SCIP guidelines and adhere to  recommendation laid forth in [place] Number 104 May 2009  "Antibiotic Prophylaxis for Gynecologic Procedures".     Malachy Mood, MD, [facility], [facility] 07/05/2021, 2:32 PM

## 2021-07-05 NOTE — H&P (View-Only) (Signed)
Obstetrics & Gynecology Surgery H&P    Chief Complaint: Scheduled Surgery   History of Present Illness: Patient is a 65 y.o. G10P0 presenting for scheduled laparoscopic BSO, for the treatment or further evaluation of right ovarian cyst.   Prior Treatments prior to proceeding with surgery include: imaging  Preoperative Pap: N/A s/p prior hysterectomy Preoperative Endometrial biopsy: N/A Preoperative Ultrasound:  03/22/2021 CT A/P 2.6cm right ovarian cyst, simple no ascites or adenopathy 06/15/2021 TVUS 2.0 x 2.4 x 2.1cm simple right ovarian cyst, left ovary not visualized   Review of Systems:10 point review of systems  Past Medical History:  Patient Active Problem List   Diagnosis Date Noted   Goals of care, counseling/discussion 02/12/2020   Essential hypertension 38/05/1750   Follicular lymphoma grade ii, lymph nodes of multiple sites ([place]) 01/29/2020   Left bundle branch block (LBBB) on electrocardiogram 01/20/2020   Generalized enlarged lymph nodes 01/11/2020    Past Surgical History:  Past Surgical History:  Procedure Laterality Date   ABDOMINAL HYSTERECTOMY     APPENDECTOMY     BREAST BIOPSY Right 04/28/2021   U/s bx 7:00/2cmfn"heart" marker-path pending   CHOLECYSTECTOMY     28years ago   [place] OF BREAST BIOPSY Right 05/19/2021   Procedure: EXCISION OF BREAST BIOPSY w/ Radio frequency tag;  Surgeon: Herbert Pun, MD;  Location: [facility] ORS;  Service: General;  Laterality: Right;   LYMPH GLAND EXCISION Right 01/22/2020   Procedure: CERVICAL LYMPH GLAND EXCISION;  Surgeon: Herbert Pun, MD;  Location: [facility] ORS;  Service: General;  Laterality: Right;   PORTACATH PLACEMENT N/A 02/24/2020   Procedure: INSERTION PORT-A-CATH;  Surgeon: Herbert Pun, MD;  Location: [facility] ORS;  Service: General;  Laterality: N/A;   TONSILLECTOMY      Family History:  Family History  Problem Relation Age of Onset   Breast  cancer Mother    Cancer Mother        unknown type    Social History:  Social History   Socioeconomic History   Marital status: Married    Spouse name: Not on file   Number of children: Not on file   Years of education: Not on file   Highest education level: Not on file  Occupational History   Not on file  Tobacco Use   Smoking status: Never   Smokeless tobacco: Never  Vaping Use   Vaping Use: Never used  Substance and Sexual Activity   Alcohol use: Yes    Alcohol/week: 1.0 standard drink    Types: 1 Glasses of wine per week    Comment: rarely   Drug use: Not Currently   Sexual activity: Yes    Birth control/protection: Surgical    Comment: Tubal  Other Topics Concern   Not on file  Social History Narrative   Lives in [place]; [place]. Never smoked; wine rarely. Used in work in [employer]. With husband.    Social Determinants of Health   Financial Resource Strain: Not on file  Food Insecurity: Not on file  Transportation Needs: Not on file  Physical Activity: Not on file  Stress: Not on file  Social Connections: Not on file  Intimate Partner Violence: Not on file    Allergies:  Allergies  Allergen Reactions   Penicillins Rash    Medications: Prior to Admission medications   Medication Sig Start Date End Date Taking? Authorizing Provider  ALPRAZolam Duanne Moron) 0.5 MG tablet Take 0.5 mg by mouth in  the morning and at bedtime. 06/06/20  Yes [provider]  amLODipine (NORVASC) 5 MG tablet Take 1 tablet (5 mg total) by mouth daily. Patient taking differently: Take 5 mg by mouth at bedtime. 03/24/21  Yes Cammie Sickle, MD  escitalopram (LEXAPRO) 20 MG tablet Take 20 mg by mouth at bedtime. 04/05/21  Yes [provider]  naproxen (NAPROSYN) 500 MG tablet Take 500 mg by mouth 2 (two) times daily. 11/18/20  Yes [provider]  omeprazole (PRILOSEC) 20 MG capsule Take 20 mg by mouth daily.   Yes [provider]  zolpidem  (AMBIEN CR) 6.25 MG CR tablet Take 1 tablet (6.25 mg total) by mouth at bedtime as needed. for sleep 03/24/21  Yes Cammie Sickle, MD  acyclovir (ZOVIRAX) 400 MG tablet Take 1 tablet by mouth once daily Patient not taking: No sig reported 03/14/21   Cammie Sickle, MD  cetirizine (ZYRTEC) 10 MG tablet Take 10 mg by mouth daily as needed for allergies. Patient not taking: No sig reported 04/07/21   [provider]  diclofenac Sodium (VOLTAREN) 1 % GEL Apply 2 g topically daily as needed (pain). Patient not taking: No sig reported 11/08/20 11/08/21  [provider]  ondansetron (ZOFRAN) 4 MG tablet TAKE 1 TABLET BY MOUTH EVERY 8 HOURS AS NEEDED FOR NAUSEA FOR VOMITING Patient not taking: No sig reported 07/27/20   Cammie Sickle, MD    Physical Exam Vitals: Blood pressure (!) 182/82, pulse 90, height 5\' 3"  (1.6 m), weight 198 lb (89.8 kg). General: NAD HEENT: normocephalic, anicteric Pulmonary: No increased work of breathing, CTAB Cardiovascular: RRR, distal pulses 2+ Abdomen: soft, non-tender, non-distended Genitourinary: deferred Extremities: no edema, erythema, or tenderness Neurologic: Grossly intact Psychiatric: mood appropriate, affect full  Imaging No results found.  Assessment: 65 y.o. G10P0 presenting for scheduled laparoscopic BSO  Plan: 1) I have had a careful discussion with this patient about all the options available and the risk/benefits of each. I have fully informed this patient that a laparoscopy may subject her to a variety of discomforts and risks: She understands that most patients have surgery with little difficulty, but problems can happen ranging from minor to fatal. These include nausea, vomiting, pain, bleeding, infection, poor healing, hernia, or formation of adhesions. Unexpected reactions may occur from any drug or anesthetic given. Unintended injury may occur to other pelvic or abdominal structures such as Fallopian tubes,  ovaries, bladder, ureter (tube from kidney to bladder), or bowel. Nerves going from the pelvis to the legs may be injured. Any such injury may require immediate or later additional surgery to correct the problem. Excessive blood loss requiring transfusion is very unlikely but possible. Dangerous blood clots may form in the legs or lungs. Physical and sexual activity will be restricted in varying degrees for an indeterminate period of time but most often 2-4 weeks. She understands that the plan is to do this laparoscopically, however, there is a chance that this will need to be performed via a larger incision. Finally, she understands that it is impossible to list every possible undesirable effect and that the condition for which surgery is done is not always cured or significantly improved, and in rare cases may be even worsen. Ample time was given to answer all questions. - we had previously documented stability of the ovarian cyst as well as no concerning findings.  Discussion was had that this cyst is most certainly benign.  However the patient is concerned given her  personal cancer history and would like to proceed with surgical removal.  Given her age we discussed pros and cons of removing the contralateral ovary in ovarian cancer risk reduction.  She is aware that lifetime risk for the average woman living to age 89 for ovarian cancer is only about 1.3%.  She elected to proceed with bilateral salpingo-oophorectomy  2) Routine postoperative instructions were reviewed with the patient and her family in detail today including the expected length of recovery and likely postoperative course.  The patient concurred with the proposed plan, giving informed written consent for the surgery today.  Patient instructed on the importance of being NPO after midnight prior to her procedure.  If warranted preoperative prophylactic antibiotics and SCDs ordered on call to the OR to meet SCIP guidelines and adhere to  recommendation laid forth in [place] Number 104 May 2009  "Antibiotic Prophylaxis for Gynecologic Procedures".     Malachy Mood, MD, [facility], [facility] 07/05/2021, 2:32 PM

## 2021-07-06 ENCOUNTER — Ambulatory Visit
Admission: RE | Admit: 2021-07-06 | Discharge: 2021-07-06 | Disposition: A | Discharge status: Home / Self Care | Payer: 59 | Attending: Obstetrics and Gynecology | Admitting: Obstetrics and Gynecology

## 2021-07-06 ENCOUNTER — Ambulatory Visit: Payer: 59 | Admitting: Certified Registered"

## 2021-07-06 ENCOUNTER — Encounter
Admission: RE | Disposition: A | Discharge status: Home / Self Care | Payer: Self-pay | Source: Home / Self Care | Attending: Obstetrics and Gynecology

## 2021-07-06 ENCOUNTER — Encounter: Payer: Self-pay | Admitting: Obstetrics and Gynecology

## 2021-07-06 DIAGNOSIS — M1990 Unspecified osteoarthritis, unspecified site: Secondary | ICD-10-CM | POA: Diagnosis not present

## 2021-07-06 DIAGNOSIS — F419 Anxiety disorder, unspecified: Secondary | ICD-10-CM | POA: Insufficient documentation

## 2021-07-06 DIAGNOSIS — I10 Essential (primary) hypertension: Secondary | ICD-10-CM | POA: Diagnosis not present

## 2021-07-06 DIAGNOSIS — N7011 Chronic salpingitis: Secondary | ICD-10-CM | POA: Diagnosis not present

## 2021-07-06 DIAGNOSIS — N736 Female pelvic peritoneal adhesions (postinfective): Secondary | ICD-10-CM | POA: Diagnosis not present

## 2021-07-06 DIAGNOSIS — F32A Depression, unspecified: Secondary | ICD-10-CM | POA: Insufficient documentation

## 2021-07-06 DIAGNOSIS — N83201 Unspecified ovarian cyst, right side: Secondary | ICD-10-CM | POA: Diagnosis not present

## 2021-07-06 DIAGNOSIS — D271 Benign neoplasm of left ovary: Secondary | ICD-10-CM | POA: Insufficient documentation

## 2021-07-06 DIAGNOSIS — D270 Benign neoplasm of right ovary: Secondary | ICD-10-CM | POA: Insufficient documentation

## 2021-07-06 DIAGNOSIS — K219 Gastro-esophageal reflux disease without esophagitis: Secondary | ICD-10-CM | POA: Insufficient documentation

## 2021-07-06 DIAGNOSIS — N83291 Other ovarian cyst, right side: Secondary | ICD-10-CM | POA: Diagnosis present

## 2021-07-06 DIAGNOSIS — Z8616 Personal history of COVID-19: Secondary | ICD-10-CM | POA: Diagnosis not present

## 2021-07-06 DIAGNOSIS — Z9889 Other specified postprocedural states: Secondary | ICD-10-CM

## 2021-07-06 HISTORY — PX: LAPAROSCOPIC BILATERAL SALPINGO OOPHERECTOMY: SHX5890

## 2021-07-06 LAB — ABO/RH: ABO/RH(D): O POS

## 2021-07-06 SURGERY — SALPINGO-OOPHORECTOMY, BILATERAL, LAPAROSCOPIC
Anesthesia: General | Laterality: Bilateral

## 2021-07-06 MED ORDER — LACTATED RINGERS IV SOLN: INTRAVENOUS | Status: DC

## 2021-07-06 MED ORDER — POVIDONE-IODINE 10 % EX SWAB: 2.0000 "application " | Freq: Once | CUTANEOUS | Status: DC

## 2021-07-06 MED ORDER — ONDANSETRON HCL 4 MG/2ML IJ SOLN
INTRAMUSCULAR | Status: DC | PRN
  Administered 2021-07-06: 4 mg via INTRAVENOUS

## 2021-07-06 MED ORDER — FENTANYL CITRATE (PF) 100 MCG/2ML IJ SOLN
25.0000 ug | INTRAMUSCULAR | Status: DC | PRN
  Administered 2021-07-06 (×2): 25 ug via INTRAVENOUS

## 2021-07-06 MED ORDER — FENTANYL CITRATE (PF) 100 MCG/2ML IJ SOLN
INTRAMUSCULAR | Status: AC
  Filled 2021-07-06: qty 2

## 2021-07-06 MED ORDER — LIDOCAINE HCL (CARDIAC) PF 100 MG/5ML IV SOSY
PREFILLED_SYRINGE | INTRAVENOUS | Status: DC | PRN
  Administered 2021-07-06: 100 mg via INTRAVENOUS

## 2021-07-06 MED ORDER — OXYCODONE HCL 5 MG PO TABS
ORAL_TABLET | ORAL | Status: AC
  Filled 2021-07-06: qty 1

## 2021-07-06 MED ORDER — MIDAZOLAM HCL 2 MG/2ML IJ SOLN
INTRAMUSCULAR | Status: AC
  Filled 2021-07-06: qty 2

## 2021-07-06 MED ORDER — ACETAMINOPHEN 10 MG/ML IV SOLN
INTRAVENOUS | Status: AC
  Filled 2021-07-06: qty 100

## 2021-07-06 MED ORDER — OXYCODONE HCL 5 MG/5ML PO SOLN: 5.0000 mg | Freq: Once | ORAL | Status: AC | PRN

## 2021-07-06 MED ORDER — PROPOFOL 10 MG/ML IV BOLUS
INTRAVENOUS | Status: DC | PRN
  Administered 2021-07-06: 150 mg via INTRAVENOUS

## 2021-07-06 MED ORDER — OXYCODONE HCL 5 MG PO TABS
5.0000 mg | ORAL_TABLET | Freq: Once | ORAL | Status: AC | PRN
  Administered 2021-07-06: 5 mg via ORAL

## 2021-07-06 MED ORDER — MIDAZOLAM HCL 2 MG/2ML IJ SOLN
INTRAMUSCULAR | Status: DC | PRN
  Administered 2021-07-06: 2 mg via INTRAVENOUS

## 2021-07-06 MED ORDER — HYDRALAZINE HCL 20 MG/ML IJ SOLN
10.0000 mg | Freq: Once | INTRAMUSCULAR | Status: AC
  Administered 2021-07-06: 10 mg via INTRAVENOUS

## 2021-07-06 MED ORDER — ROCURONIUM BROMIDE 100 MG/10ML IV SOLN
INTRAVENOUS | Status: DC | PRN
  Administered 2021-07-06: 50 mg via INTRAVENOUS

## 2021-07-06 MED ORDER — ALBUTEROL SULFATE HFA 108 (90 BASE) MCG/ACT IN AERS
INHALATION_SPRAY | RESPIRATORY_TRACT | Status: DC | PRN
  Administered 2021-07-06 (×3): 4 via RESPIRATORY_TRACT

## 2021-07-06 MED ORDER — HYDROMORPHONE HCL 1 MG/ML IJ SOLN
INTRAMUSCULAR | Status: DC | PRN
  Administered 2021-07-06: 1 mg via INTRAVENOUS

## 2021-07-06 MED ORDER — OXYCODONE-ACETAMINOPHEN 5-325 MG PO TABS: 1.0000 | ORAL_TABLET | ORAL | 0 refills | Status: DC | PRN

## 2021-07-06 MED ORDER — PHENYLEPHRINE HCL-NACL 20-0.9 MG/250ML-% IV SOLN
INTRAVENOUS | Status: DC | PRN
  Administered 2021-07-06: 25 ug/min via INTRAVENOUS

## 2021-07-06 MED ORDER — IPRATROPIUM-ALBUTEROL 0.5-2.5 (3) MG/3ML IN SOLN: 3.0000 mL | Freq: Once | RESPIRATORY_TRACT | Status: AC

## 2021-07-06 MED ORDER — ORAL CARE MOUTH RINSE: 15.0000 mL | Freq: Once | OROMUCOSAL | Status: AC

## 2021-07-06 MED ORDER — FENTANYL CITRATE (PF) 100 MCG/2ML IJ SOLN
INTRAMUSCULAR | Status: DC | PRN
  Administered 2021-07-06 (×2): 50 ug via INTRAVENOUS

## 2021-07-06 MED ORDER — 0.9 % SODIUM CHLORIDE (POUR BTL) OPTIME
TOPICAL | Status: DC | PRN
  Administered 2021-07-06: 500 mL

## 2021-07-06 MED ORDER — HYDROMORPHONE HCL 1 MG/ML IJ SOLN
INTRAMUSCULAR | Status: AC
  Filled 2021-07-06: qty 1

## 2021-07-06 MED ORDER — SUGAMMADEX SODIUM 500 MG/5ML IV SOLN
INTRAVENOUS | Status: DC | PRN
  Administered 2021-07-06: 400 mg via INTRAVENOUS

## 2021-07-06 MED ORDER — DEXAMETHASONE SODIUM PHOSPHATE 10 MG/ML IJ SOLN
INTRAMUSCULAR | Status: DC | PRN
  Administered 2021-07-06: 10 mg via INTRAVENOUS

## 2021-07-06 MED ORDER — IPRATROPIUM-ALBUTEROL 0.5-2.5 (3) MG/3ML IN SOLN
RESPIRATORY_TRACT | Status: AC
  Administered 2021-07-06: 3 mL via RESPIRATORY_TRACT
  Filled 2021-07-06: qty 3

## 2021-07-06 MED ORDER — ACETAMINOPHEN 10 MG/ML IV SOLN
INTRAVENOUS | Status: DC | PRN
  Administered 2021-07-06: 1000 mg via INTRAVENOUS

## 2021-07-06 MED ORDER — CHLORHEXIDINE GLUCONATE 0.12 % MT SOLN
15.0000 mL | Freq: Once | OROMUCOSAL | Status: AC
  Administered 2021-07-06: 15 mL via OROMUCOSAL

## 2021-07-06 MED ORDER — GLYCOPYRROLATE 0.2 MG/ML IJ SOLN
INTRAMUSCULAR | Status: DC | PRN
  Administered 2021-07-06: .2 mg via INTRAVENOUS

## 2021-07-06 MED ORDER — LABETALOL HCL 5 MG/ML IV SOLN
INTRAVENOUS | Status: DC | PRN
  Administered 2021-07-06: 5 mg via INTRAVENOUS

## 2021-07-06 MED ORDER — HYDRALAZINE HCL 20 MG/ML IJ SOLN
INTRAMUSCULAR | Status: AC
  Filled 2021-07-06: qty 1

## 2021-07-06 MED ORDER — BUPIVACAINE HCL 0.5 % IJ SOLN
INTRAMUSCULAR | Status: DC | PRN
  Administered 2021-07-06: 10 mL

## 2021-07-06 MED ORDER — BUPIVACAINE HCL (PF) 0.5 % IJ SOLN
INTRAMUSCULAR | Status: AC
  Filled 2021-07-06: qty 30

## 2021-07-06 MED ORDER — PHENYLEPHRINE HCL (PRESSORS) 10 MG/ML IV SOLN
INTRAVENOUS | Status: DC | PRN
  Administered 2021-07-06: 80 ug via INTRAVENOUS

## 2021-07-06 SURGICAL SUPPLY — 45 items
ADH SKN CLS APL DERMABOND .7 (GAUZE/BANDAGES/DRESSINGS) ×1
ANCHOR TIS RET SYS 235ML (MISCELLANEOUS) ×2 IMPLANT
APL PRP STRL LF DISP 70% ISPRP (MISCELLANEOUS) ×1
BAG DRN RND TRDRP ANRFLXCHMBR (UROLOGICAL SUPPLIES) ×1
BAG TISS RTRVL C235 10X14 (MISCELLANEOUS) ×1
BAG URINE DRAIN 2000ML AR STRL (UROLOGICAL SUPPLIES) ×2 IMPLANT
BLADE SURG SZ11 CARB STEEL (BLADE) ×2 IMPLANT
CATH FOLEY 2WAY  5CC 16FR (CATHETERS) ×1
CATH FOLEY 2WAY 5CC 16FR (CATHETERS) ×1
CATH URTH 16FR FL 2W BLN LF (CATHETERS) ×1 IMPLANT
CHLORAPREP W/TINT 26 (MISCELLANEOUS) ×2 IMPLANT
DERMABOND ADVANCED (GAUZE/BANDAGES/DRESSINGS) ×1
DERMABOND ADVANCED .7 DNX12 (GAUZE/BANDAGES/DRESSINGS) ×1 IMPLANT
GAUZE 4X4 16PLY ~~LOC~~+RFID DBL (SPONGE) ×2 IMPLANT
GLOVE SURG ENC MOIS LTX SZ7 (GLOVE) ×2 IMPLANT
GLOVE SURG UNDER LTX SZ7.5 (GLOVE) ×2 IMPLANT
GOWN STRL REUS W/ TWL LRG LVL3 (GOWN DISPOSABLE) ×2 IMPLANT
GOWN STRL REUS W/ TWL XL LVL3 (GOWN DISPOSABLE) IMPLANT
GOWN STRL REUS W/TWL LRG LVL3 (GOWN DISPOSABLE) ×4
GOWN STRL REUS W/TWL XL LVL3 (GOWN DISPOSABLE)
GRASPER SUT TROCAR 14GX15 (MISCELLANEOUS) IMPLANT
IRRIGATION STRYKERFLOW (MISCELLANEOUS) IMPLANT
IRRIGATOR STRYKERFLOW (MISCELLANEOUS)
IV LACTATED RINGERS 1000ML (IV SOLUTION) ×2 IMPLANT
KIT PINK PAD W/HEAD ARE REST (MISCELLANEOUS) ×2
KIT PINK PAD W/HEAD ARM REST (MISCELLANEOUS) ×1 IMPLANT
KIT TURNOVER CYSTO (KITS) ×2 IMPLANT
LABEL OR SOLS (LABEL) ×2 IMPLANT
MANIFOLD NEPTUNE II (INSTRUMENTS) ×2 IMPLANT
NS IRRIG 500ML POUR BTL (IV SOLUTION) ×2 IMPLANT
PACK GYN LAPAROSCOPIC (MISCELLANEOUS) ×2 IMPLANT
PAD OB MATERNITY 4.3X12.25 (PERSONAL CARE ITEMS) ×2 IMPLANT
PAD PREP 24X41 OB/GYN DISP (PERSONAL CARE ITEMS) ×2 IMPLANT
SCISSORS METZENBAUM CVD 33 (INSTRUMENTS) IMPLANT
SCRUB EXIDINE 4% CHG 4OZ (MISCELLANEOUS) ×2 IMPLANT
SET CYSTO W/LG BORE CLAMP LF (SET/KITS/TRAYS/PACK) ×2 IMPLANT
SET TUBE SMOKE EVAC HIGH FLOW (TUBING) ×2 IMPLANT
SHEARS HARMONIC ACE PLUS 36CM (ENDOMECHANICALS) ×2 IMPLANT
SLEEVE ENDOPATH XCEL 5M (ENDOMECHANICALS) ×4 IMPLANT
SUT MNCRL AB 4-0 PS2 18 (SUTURE) ×2 IMPLANT
SUT VIC AB 2-0 UR6 27 (SUTURE) ×2 IMPLANT
SUT VICRYL 0 AB UR-6 (SUTURE) ×2 IMPLANT
TROCAR ENDO BLADELESS 11MM (ENDOMECHANICALS) IMPLANT
TROCAR XCEL NON-BLD 5MMX100MML (ENDOMECHANICALS) ×2 IMPLANT
WATER STERILE IRR 500ML POUR (IV SOLUTION) ×2 IMPLANT

## 2021-07-06 NOTE — Discharge Instructions (Signed)
AMBULATORY SURGERY  ?DISCHARGE INSTRUCTIONS ? ? ?The drugs that you were given will stay in your system until tomorrow so for the next 24 hours you should not: ? ?Drive an automobile ?Make any legal decisions ?Drink any alcoholic beverage ? ? ?You may resume regular meals tomorrow.  Today it is better to start with liquids and gradually work up to solid foods. ? ?You may eat anything you prefer, but it is better to start with liquids, then soup and crackers, and gradually work up to solid foods. ? ? ?Please notify your doctor immediately if you have any unusual bleeding, trouble breathing, redness and pain at the surgery site, drainage, fever, or pain not relieved by medication. ? ? ? ?Additional Instructions: ? ? ? ?Please contact your physician with any problems or Same Day Surgery at [phone], Monday through Friday 6 am to 4 pm, or [place] [facility] [place] Main number at [phone].  ?

## 2021-07-06 NOTE — Anesthesia Preprocedure Evaluation (Signed)
Anesthesia Evaluation  Patient identified by MRN, date of birth, ID band Patient awake    Reviewed: Allergy & Precautions, NPO status , Patient's Chart, lab work & pertinent test results  History of Anesthesia Complications Negative for: history of anesthetic complications  Airway Mallampati: III  TM Distance: >3 FB Neck ROM: full    Dental  (+) Chipped   Pulmonary neg shortness of breath,    Pulmonary exam normal        Cardiovascular Exercise Tolerance: Good hypertension, (-) anginaNormal cardiovascular exam     Neuro/Psych  Headaches, negative psych ROS   GI/Hepatic Neg liver ROS, GERD  Medicated and Controlled,  Endo/Other  negative endocrine ROS  Renal/GU      Musculoskeletal   Abdominal   Peds  Hematology negative hematology ROS (+)   Anesthesia Other Findings Past Medical History: No date: Anxiety No date: Arthritis No date: Cancer ([place]) 2021: COVID-19 No date: Depression No date: GERD (gastroesophageal reflux disease) No date: Headache No date: Hypertension No date: Pneumonia  Past Surgical History: No date: ABDOMINAL HYSTERECTOMY No date: APPENDECTOMY 04/28/2021: BREAST BIOPSY; Right     Comment:  U/s bx 7:00/2cmfn"heart" marker-path pending No date: CHOLECYSTECTOMY     Comment:  28years ago No date: DILATION AND CURETTAGE OF UTERUS 05/19/2021: EXCISION OF BREAST BIOPSY; Right     Comment:  Procedure: EXCISION OF BREAST BIOPSY w/ Radio frequency               tag;  Surgeon: Herbert Pun, MD;  Location: [facility]              ORS;  Service: General;  Laterality: Right; 01/22/2020: LYMPH GLAND EXCISION; Right     Comment:  Procedure: CERVICAL LYMPH GLAND EXCISION;  Surgeon:               Herbert Pun, MD;  Location: [facility] ORS;  Service:              General;  Laterality: Right; 02/24/2020: PORTACATH PLACEMENT; N/A     Comment:  Procedure: INSERTION PORT-A-CATH;  Surgeon:                Herbert Pun, MD;  Location: [facility] ORS;  Service:              General;  Laterality: N/A; No date: TONSILLECTOMY  BMI    Body Mass Index: 35.07 kg/m      Reproductive/Obstetrics negative OB ROS                             Anesthesia Physical Anesthesia Plan  ASA: 3  Anesthesia Plan: General ETT   Post-op Pain Management:    Induction: Intravenous  PONV Risk Score and Plan: Ondansetron, Dexamethasone, Midazolam and Treatment may vary due to age or medical condition  Airway Management Planned: Oral ETT  Additional Equipment:   Intra-op Plan:   Post-operative Plan: Extubation in OR  Informed Consent: I have reviewed the patients History and Physical, chart, labs and discussed the procedure including the risks, benefits and alternatives for the proposed anesthesia with the patient or authorized representative who has indicated his/her understanding and acceptance.     Dental Advisory Given  Plan Discussed with: Anesthesiologist, CRNA and Surgeon  Anesthesia Plan Comments: (Patient consented for risks of anesthesia including but not limited to:  - adverse reactions to medications - damage to eyes, teeth, lips or other oral mucosa - nerve damage due to positioning  -  sore throat or hoarseness - Damage to heart, brain, nerves, lungs, other parts of body or loss of life  Patient voiced understanding.)        Anesthesia Quick Evaluation

## 2021-07-06 NOTE — Anesthesia Procedure Notes (Signed)
Procedure Name: Intubation Date/Time: 07/06/2021 12:17 PM Performed by: Kelton Pillar, CRNA Pre-anesthesia Checklist: Patient identified, Emergency Drugs available, Suction available and Patient being monitored Patient Re-evaluated:Patient Re-evaluated prior to induction Oxygen Delivery Method: Circle system utilized Preoxygenation: Pre-oxygenation with 100% oxygen Induction Type: IV induction Ventilation: Mask ventilation without difficulty Laryngoscope Size: McGraph and 3 Grade View: Grade I Tube type: Oral Tube size: 7.0 mm Number of attempts: 1 Airway Equipment and Method: Stylet and Oral airway Placement Confirmation: ETT inserted through vocal cords under direct vision, positive ETCO2, breath sounds checked- equal and bilateral and CO2 detector Secured at: 21 cm Tube secured with: Tape Dental Injury: Teeth and Oropharynx as per pre-operative assessment

## 2021-07-06 NOTE — Transfer of Care (Signed)
Immediate Anesthesia Transfer of Care Note  Patient: Danielle Lewis  Procedure(s) Performed: LAPAROSCOPIC BILATERAL SALPINGO OOPHORECTOMY WITH CYSTOSCOPY (Bilateral)  Patient Location: PACU  Anesthesia Type:General  Level of Consciousness: awake, drowsy and patient cooperative  Airway & Oxygen Therapy: Patient Spontanous Breathing and Patient connected to face mask oxygen  Post-op Assessment: Report given to RN and Post -op Vital signs reviewed and stable  Post vital signs: Reviewed and stable  Last Vitals:  Vitals Value Taken Time  BP 182/92 07/06/21 1348  Temp 36.2 C 07/06/21 1348  Pulse 77 07/06/21 1353  Resp 15 07/06/21 1353  SpO2 93 % 07/06/21 1353  Vitals shown include unvalidated device data.  Last Pain:  Vitals:   07/06/21 1130  TempSrc: Temporal  PainSc: 0-No pain      Patients Stated Pain Goal: 0 (58/09/98 3382)  Complications: No notable events documented.

## 2021-07-06 NOTE — Interval H&P Note (Signed)
History and Physical Interval Note:  07/06/2021 12:10 PM  Danielle Lewis  has presented today for surgery, with the diagnosis of Right ovarian cyst N83.201.  The various methods of treatment have been discussed with the patient and family. After consideration of risks, benefits and other options for treatment, the patient has consented to  Procedure(s): LAPAROSCOPIC BILATERAL SALPINGO OOPHORECTOMY (Bilateral) as a surgical intervention.  The patient's history has been reviewed, patient examined, no change in status, stable for surgery.  I have reviewed the patient's chart and labs.  Questions were answered to the patient's satisfaction.     Malachy Mood

## 2021-07-06 NOTE — Anesthesia Postprocedure Evaluation (Signed)
Anesthesia Post Note  Patient: LIBNI FUSARO  Procedure(s) Performed: LAPAROSCOPIC BILATERAL SALPINGO OOPHORECTOMY WITH CYSTOSCOPY (Bilateral)  Patient location during evaluation: PACU Anesthesia Type: General Level of consciousness: awake and alert Pain management: pain level controlled Vital Signs Assessment: post-procedure vital signs reviewed and stable Respiratory status: spontaneous breathing, nonlabored ventilation, respiratory function stable and patient connected to nasal cannula oxygen Cardiovascular status: blood pressure returned to baseline and stable Postop Assessment: no apparent nausea or vomiting Anesthetic complications: no   No notable events documented.   Last Vitals:  Vitals:   07/06/21 1445 07/06/21 1500  BP: 130/66 127/66  Pulse: 85 85  Resp: 13 15  Temp:    SpO2: 95% 90%    Last Pain:  Vitals:   07/06/21 1415  TempSrc:   PainSc: [place]

## 2021-07-06 NOTE — Op Note (Signed)
Preoperative Diagnosis: 1) 66 y.o. with 2.5cm simple right ovarian cyst  Postoperative Diagnosis: 1) 65 y.o. with right hydrosalpinx  Operation Performed: Laparoscopy bilateral salpingo-oophorectomy, cystoscopy  Indication: 65 y.o. G10P0  with stable simple right ovarian cyst on imaging  Surgeon: Malachy Mood, MD  Anesthesia: General  Preoperative Antibiotics: none  Estimated Blood Loss: 104mL  IV Fluids: 1L Crystaloid  Urine Output:: 341mL  Drains or Tubes: none  Implants: none  Specimens Removed: Bilateral fallopian tubes and ovaries.  No real residual tubal tissue noted on left ovary.  Right ovary with hydrosalpinx  Complications: none  Intraoperative Findings: Atrophic appearing bilateral ovaries cortex noted to contain small clear fluid filled cysts.  Right hydrosalpinx.  Status post prior hysterectomy.  Some adhesive disease of the ovaries to the sigmoid colon and pelvic side-wall.  Cystoscopy revealed intact bladder dome and bilateral efflux of urine from both ureteral orifices.   Patient Condition: stable  Procedure in Detail:  Patient was taken to the operating room where she was administered general anesthesia.  She was positioned in the dorsal lithotomy position utilizing An indwelling foley catheter was used to empty and decompress the patient's bladder.  A vaginal sponge stick was placed to delineate the vaginal cuff  Attention was turned to the patient's abdomen.  The umbilicus was infiltrated with 1% Sensorcaine, before making a stab incision using an 11 blade scalpel.  A 45mm Excel trocar was then used to gain direct entry into the peritoneal cavity utilizing the camera to visualize progress of the trocar during placement.  Once peritoneal entry had been achieved, insufflation was started and pneumoperitoneum established at a pressure of 44mmHg. Two additional 38mm excel trocars were placed under direct visualiztion in the LLQ and  and at Palmer's point.  General  inspection of the abdomen revealed the above noted findings.   Adhesions of the left ovary to the pelvic sidewall and sigmoid colon were carefully taken down using a 81mm Harmonic scalpel to allow adequate visualization of the the IP ligament and allow the bowl to be moved out of the pelvis.  The IP ligament was visualized and by retracting the ovary medially, hugging the cortex the IP ligament was transected using the 37mm Harmonic scalpel.  The ovary was partially adhesed to the vaginal cuff and was slowly dissected of the cuff using the sponge stick to help delineate the cuff.  Following removal of the left ovary attention was turned to the patient right ovary.  The hydrosalpinx was dissected of the ovary to allow visualization of the IP ligament.  Ureter was visualized deep in the pelvis away from the IP ligament.  The IP ligament was transected using a the 77mm Harmonic scalpel, the ovary was then carefully dissected of the pelvic sidewall.   All pedicles were inspected and noted to be hemostatic.  The umbilical trocar site was exchanged for an 30mm Excel trocar and the ovaries removed using an endocatch bag.  The 44mm Trocar site was closed using a 0 Vicryl on a Carter-Thompson.    Pneumoperitoneum was evacuated.  The  remaining trocars were removed.  The 66mm trocar site was closed with 4-0 Monocryl in a subcuticular fashion.  All trocar sites were then dressed with surgical skin glue.  The vaginal sponge stick was removed.   Foley was removed and cystoscopy revealed intact bladder dome and bilateral efflux of urine from both ureteral orifices.  Sponge needle and instrument counts were correct time two.  The patient tolerated the procedure well  and was taken to the recovery room in stable condition.

## 2021-07-07 ENCOUNTER — Encounter: Payer: Self-pay | Admitting: Obstetrics and Gynecology

## 2021-07-07 LAB — SURGICAL PATHOLOGY

## 2021-07-13 ENCOUNTER — Ambulatory Visit (INDEPENDENT_AMBULATORY_CARE_PROVIDER_SITE_OTHER): Payer: 59 | Admitting: Obstetrics and Gynecology

## 2021-07-13 ENCOUNTER — Encounter: Payer: Self-pay | Admitting: Obstetrics and Gynecology

## 2021-07-13 ENCOUNTER — Other Ambulatory Visit: Payer: Self-pay

## 2021-07-13 VITALS — BP 156/92 | Ht 63.0 in | Wt 197.0 lb

## 2021-07-13 DIAGNOSIS — Z4889 Encounter for other specified surgical aftercare: Secondary | ICD-10-CM

## 2021-07-13 NOTE — Progress Notes (Signed)
Postoperative Follow-up Patient presents post op from laproscopic bilateral salpingo-oophorectomy 1weeks ago for adnexal mass.  Subjective: Patient reports some improvement in her preop symptoms. Eating a regular diet without difficulty. Pain is controlled with current analgesics. Medications being used: prescription NSAID's including ibuprofen (Motrin) and narcotic analgesics including oxycodone/acetaminophen (Percocet, Tylox).  Activity: normal activities of daily living.  Objective: Blood pressure (!) 156/92, height 5\' 3"  (1.6 m), weight 197 lb (89.4 kg).  General: NAD Pulmonary: no increased work of breathing Abdomen: soft, non-tender, non-distended, incision(s) D/C/I Extremities: no edema Neurologic: normal gait   Admission on 07/06/2021, Discharged on 07/06/2021  Component Date Value Ref Range Status   ABO/RH(D) 07/06/2021    Final                   Value:O POS Performed at [facility], [address] [place], [place] [address]    SURGICAL PATHOLOGY 07/06/2021    Final-Edited                   Value:SURGICAL PATHOLOGY CASE: [id number] PATIENT: [facility] Report     Specimen Submitted: A. Bilateral tubes and bilateral ovaries  Clinical History: Right ovarian cyst N83.201      DIAGNOSIS: A. BILATERAL OVARIES AND FALLOPIAN TUBES; BILATERAL SALPINGO-OOPHORECTOMY: - BILATERAL OVARIES WITH SEROUS CYSTADENOMA, MULTIFOCAL. - PORTIONS OF FALLOPIAN TUBE WITH FIMBRIATED END. - NEGATIVE FOR ATYPIA AND MALIGNANCY.  GROSS DESCRIPTION: A. Labeled: Bilateral tubes and ovaries Received: Fresh Collection time: 1:26 PM on 07/06/2021 Placed into formalin time: 1:48 PM on 07/06/2021 Type of procedure: Bilateral salpingo-oophorectomy Integrity: The ovaries each have focal areas of disruption. Weight of specimen: Adnexa A: 2.74 g; adnexa B: 4.47 g grams Size of specimen:           Ovary: Ovary A: 3.3 x 1.2 x 1 cm; ovary B: 3.7 x 2.2  x 1 cm           Fallopian tube: Fallopian tube A: 1.7 cm long by 0.3 cm in diameter; fallopian tube B: Aggregate,                          2 x 1.5 x 0.5 cm Ovarian external surface: Ovary A has a white-pink and cerebriform serosa with multiple unilocular cysts ranging from 0.1 to 0.7 cm in greatest dimension.  There is a 2.7 x 1 cm area of roughened and cauterized disruption (inked blue).  Ovary B has a white-pink and cerebriform serosa with multiple unilocular cysts ranging from 0.1 to 2.4 cm in greatest dimension.  There is a 2.5 x 1.6 cm area of roughened and cauterized disruption (inked black). Ovarian internal surface: Ovary A has a tan-purple cut surface with multiple additional unilocular cysts.  No distinct additional masses or lesions are grossly identified.  Ovary B has a tan-pink cut surface with multiple additional unilocular cysts and an additional white cystlike area, 0.5 x 0.4 x 0.4 cm.  No distinct additional masses or lesions are grossly identified. Fallopian tube lumen: Presumed fallopian tube A is received densely adherent to the ovary.  There is a portion of tan-pink potential fimbria a                         nd a potential lumen, dilated up to 0.2 cm in diameter.  There are 2 adjacent cysts ranging from 0.8 to 1 cm in greatest dimension.  Presumed fallopian tube B is received densely adherent to the ovary.  There are 3 fragments of tan-pink potential fimbria surrounding the largest cyst.  A distinct portion of lumen is not grossly appreciated.  Block summary: 1 - 2 - representative ovary A 3 - potential fallopian tube A fimbria, longitudinally sectioned and submitted entirely 4 - potential fallopian tube A cross-sections, submitted entirely 5 - 7 - representative ovary B 8 - potential fallopian tube B fimbria, submitted entirely  RB 07/06/2021  Final Diagnosis performed by Quay Burow, MD.   Electronically signed 07/07/2021 4:24:05PM The electronic  signature indicates that the named Attending Pathologist has evaluated the specimen Technical component performed at [facility], [address], [place], [place] [address] Lab: [phone] Dir: Rush Farmer, MD, MMM  Professio                         nal component performed at [facility], [facility], [place], [place],  [address] Lab: [phone] Dir: Kathi Simpers, MD     Assessment: 65 y.o. s/p laproscopic BSO stable  Plan: Patient has done well after surgery with no apparent complications.  I have discussed the post-operative course to date, and the expected progress moving forward.  The patient understands what complications to be concerned about.  I will see the patient in routine follow up, or sooner if needed.    Activity plan: No restriction.   Malachy Mood, MD, [facility], [facility] 07/13/2021, 8:36 AM

## 2021-08-02 ENCOUNTER — Telehealth: Payer: 59 | Admitting: Emergency Medicine

## 2021-08-02 DIAGNOSIS — R6889 Other general symptoms and signs: Secondary | ICD-10-CM

## 2021-08-02 MED ORDER — OSELTAMIVIR PHOSPHATE 75 MG PO CAPS: 75.0000 mg | ORAL_CAPSULE | Freq: Two times a day (BID) | ORAL | 0 refills | Status: AC

## 2021-08-02 MED ORDER — BENZONATATE 100 MG PO CAPS: 100.0000 mg | ORAL_CAPSULE | Freq: Two times a day (BID) | ORAL | 0 refills | Status: DC | PRN

## 2021-08-02 NOTE — Progress Notes (Signed)
Virtual Visit Consent   Danielle Lewis, you are scheduled for a virtual visit with a [facility] provider today.     Just as with appointments in the office, your consent must be obtained to participate.  Your consent will be active for this visit and any virtual visit you may have with one of our providers in the next 365 days.     If you have a MyChart account, a copy of this consent can be sent to you electronically.  All virtual visits are billed to your insurance company just like a traditional visit in the office.    As this is a virtual visit, video technology does not allow for your provider to perform a traditional examination.  This may limit your provider's ability to fully assess your condition.  If your provider identifies any concerns that need to be evaluated in person or the need to arrange testing (such as labs, EKG, etc.), we will make arrangements to do so.     Although advances in technology are sophisticated, we cannot ensure that it will always work on either your end or our end.  If the connection with a video visit is poor, the visit may have to be switched to a telephone visit.  With either a video or telephone visit, we are not always able to ensure that we have a secure connection.     I need to obtain your verbal consent now.   Are you willing to proceed with your visit today?    Danielle Lewis has provided verbal consent on 08/02/2021 for a virtual visit video.   Montine Circle, PA-C   Date: 08/02/2021 12:12 PM   Virtual Visit via Video Note   I, Montine Circle, connected with  Danielle Lewis  ([id number], 1955/12/16) on 08/02/21 at 12:15 PM EST by a video-enabled telemedicine application and verified that I am speaking with the correct person using two identifiers.  Location: Patient: Virtual Visit Location Patient: Home Provider: Virtual Visit Location Provider: Home Office   I discussed the limitations of evaluation and management by telemedicine and the  availability of in person appointments. The patient expressed understanding and agreed to proceed.    History of Present Illness: Danielle Lewis is a 65 y.o. who identifies as a female who was assigned female at birth, and is being seen today for cough.  Onset was 2 days ago.  Reports exposure to grandkids who have all had flu.  Denies fever, but has been taking naproxen.   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 (Patient taking differently: Take 5 mg by mouth at bedtime.), Disp: 30 tablet, Rfl: 0   cetirizine (ZYRTEC) 10 MG tablet, Take 10 mg by mouth daily as needed for allergies., Disp: , Rfl:    escitalopram (LEXAPRO) 20 MG tablet, Take 20  mg by mouth at bedtime., Disp: , Rfl:    naproxen (NAPROSYN) 500 MG tablet, Take 500 mg by mouth 2 (two) times daily., Disp: , Rfl:    omeprazole (PRILOSEC) 20 MG capsule, Take 20 mg by mouth daily., Disp: , Rfl:    oxyCODONE-acetaminophen (PERCOCET) 5-325 MG tablet, Take 1 tablet by mouth every 4 (four) hours as needed for severe pain., Disp: 30 tablet, Rfl: 0   zolpidem (AMBIEN CR) 6.25 MG CR tablet, Take 1 tablet (6.25 mg total) by mouth at bedtime as needed. for sleep, Disp: 30 tablet, Rfl: 0  Observations/Objective: Patient is well-developed, well-nourished in no acute distress.  Resting comfortably  at home.  Head is  normocephalic, atraumatic.  No labored breathing.  Speech is clear and coherent with logical content.  Patient is alert and oriented at baseline.  Well appearing, breathing easily, no distress  Assessment and Plan: There are no diagnoses linked to this encounter. Tamiflu for presumed flu; denies kidney or liver problems. Tessalon perles for cough  Follow Up Instructions: I discussed the assessment and treatment plan with the patient. The patient was provided an opportunity to ask questions and all were answered. The patient agreed with the plan and demonstrated an understanding of the instructions.  A copy of instructions were sent to the patient via MyChart unless otherwise noted below.     The patient was advised to call back or seek an in-person evaluation if the symptoms worsen or if the condition fails to improve as anticipated.  Time:  I spent 8 minutes with the patient via telehealth technology discussing the above problems/concerns.    Montine Circle, PA-C

## 2021-08-09 ENCOUNTER — Telehealth: Payer: 59 | Admitting: Physician Assistant

## 2021-08-09 DIAGNOSIS — B9689 Other specified bacterial agents as the cause of diseases classified elsewhere: Secondary | ICD-10-CM

## 2021-08-09 DIAGNOSIS — J208 Acute bronchitis due to other specified organisms: Secondary | ICD-10-CM

## 2021-08-09 MED ORDER — DOXYCYCLINE HYCLATE 100 MG PO TABS: 100.0000 mg | ORAL_TABLET | Freq: Two times a day (BID) | ORAL | 0 refills | Status: DC

## 2021-08-09 MED ORDER — BENZONATATE 100 MG PO CAPS
100.0000 mg | ORAL_CAPSULE | Freq: Three times a day (TID) | ORAL | 0 refills | Status: DC | PRN
Start: 2021-08-09 — End: 2021-10-06

## 2021-08-09 NOTE — Progress Notes (Signed)
Virtual Visit Consent   Danielle Lewis, you are scheduled for a virtual visit with a [facility] provider today.     Just as with appointments in the office, your consent must be obtained to participate.  Your consent will be active for this visit and any virtual visit you may have with one of our providers in the next 365 days.     If you have a MyChart account, a copy of this consent can be sent to you electronically.  All virtual visits are billed to your insurance company just like a traditional visit in the office.    As this is a virtual visit, video technology does not allow for your provider to perform a traditional examination.  This may limit your provider's ability to fully assess your condition.  If your provider identifies any concerns that need to be evaluated in person or the need to arrange testing (such as labs, EKG, etc.), we will make arrangements to do so.     Although advances in technology are sophisticated, we cannot ensure that it will always work on either your end or our end.  If the connection with a video visit is poor, the visit may have to be switched to a telephone visit.  With either a video or telephone visit, we are not always able to ensure that we have a secure connection.     I need to obtain your verbal consent now.   Are you willing to proceed with your visit today?    Danielle Lewis has provided verbal consent on 08/09/2021 for a virtual visit (video or telephone).   Leeanne Rio, [place]   Date: 08/09/2021 10:16 AM   Virtual Visit via Video Note   I, Leeanne Rio, connected with  Danielle Lewis  ([id number], Oct 03, 1955) on 08/09/21 at 10:00 AM EST by a video-enabled telemedicine application and verified that I am speaking with the correct person using two identifiers.  Location: Patient: Virtual Visit Location Patient: Home Provider: Virtual Visit Location Provider: Home Office   I discussed the limitations of evaluation and management by  telemedicine and the availability of in person appointments. The patient expressed understanding and agreed to proceed.    History of Present Illness: Danielle Lewis is a 65 y.o. who identifies as a female who was assigned female at birth, and is being seen today for ongoing URI symptoms. Was evaluated last week and treated for suspected flu with Tamiflu, Tessalon and Supportive measures. Notes Tamiflu did not seem to help things. She has remained fever-free. Main issue is the persistent cough that has now become productive of thick green phlegm. Denies any chest pain or new onset SOB.  Denies chills, aches.   Has taken Tessalon and Aleve in the past couple of days for symptoms. Notes the Tessalon helps but does not last long enough (currently dosed at every 12 hours).   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[place]) 01/29/2020   Left bundle branch block (LBBB) on electrocardiogram 01/20/2020   Generalized enlarged lymph nodes 01/11/2020    Allergies:  Allergies  Allergen Reactions   Penicillins Rash   Medications:  Current Outpatient Medications:    benzonatate (TESSALON) 100 MG capsule, Take 1-2 capsules (100-200 mg total) by mouth 3 (  three) times daily as needed for cough., Disp: 30 capsule, Rfl: 0   doxycycline (VIBRA-TABS) 100 MG tablet, Take 1 tablet (100 mg total) by mouth 2 (two) times daily., Disp: 14 tablet, Rfl: 0   ALPRAZolam (XANAX) 0.5 MG tablet, Take 0.5 mg by mouth in the morning and at bedtime., Disp: , Rfl:    amLODipine (NORVASC) 5 MG tablet, Take 1 tablet (5 mg total) by mouth daily. (Patient taking differently: Take 5 mg by mouth at bedtime.), Disp: 30 tablet, Rfl: 0   cetirizine (ZYRTEC) 10 MG tablet, Take 10 mg by mouth daily as needed for allergies., Disp: , Rfl:     escitalopram (LEXAPRO) 20 MG tablet, Take 20 mg by mouth at bedtime., Disp: , Rfl:    naproxen (NAPROSYN) 500 MG tablet, Take 500 mg by mouth 2 (two) times daily., Disp: , Rfl:    omeprazole (PRILOSEC) 20 MG capsule, Take 20 mg by mouth daily., Disp: , Rfl:    oxyCODONE-acetaminophen (PERCOCET) 5-325 MG tablet, Take 1 tablet by mouth every 4 (four) hours as needed for severe pain., Disp: 30 tablet, Rfl: 0   zolpidem (AMBIEN CR) 6.25 MG CR tablet, Take 1 tablet (6.25 mg total) by mouth at bedtime as needed. for sleep, Disp: 30 tablet, Rfl: 0  Observations/Objective: Patient is well-developed, well-nourished in no acute distress.  Resting comfortably at home.  Head is normocephalic, atraumatic.  No labored breathing. Speech is clear and coherent with logical content.  Patient is alert and oriented at baseline.    Assessment and Plan: 1. Acute bacterial bronchitis - doxycycline (VIBRA-TABS) 100 MG tablet; Take 1 tablet (100 mg total) by mouth 2 (two) times daily.  Dispense: 14 tablet; Refill: 0 - benzonatate (TESSALON) 100 MG capsule; Take 1-2 capsules (100-200 mg total) by mouth 3 (three) times daily as needed for cough.  Dispense: 30 capsule; Refill: 0 Giving her history of lymphoma and current symptoms, concern for bacterial etiology. No alarm signs/symptoms present. Rx Doxycycline.  Increase fluids.  Rest.  Saline nasal spray.  Probiotic.  Mucinex as directed.  Humidifier in bedroom. Tessalon refilled with new dosing instructions. Strict UC/ER precautions reviewed.  Call or return to clinic if symptoms are not improving.   Follow Up Instructions: I discussed the assessment and treatment plan with the patient. The patient was provided an opportunity to ask questions and all were answered. The patient agreed with the plan and demonstrated an understanding of the instructions.  A copy of instructions were sent to the patient via MyChart unless otherwise noted below.   The patient was advised  to call back or seek an in-person evaluation if the symptoms worsen or if the condition fails to improve as anticipated.  Time:  I spent 15 minutes with the patient via telehealth technology discussing the above problems/concerns.    Leeanne Rio, PA-C

## 2021-08-09 NOTE — Patient Instructions (Signed)
Danielle Lewis, thank you for joining Leeanne Rio, PA-C for today's virtual visit.  While this provider is not your primary care provider (PCP), if your PCP is located in our provider database this encounter information will be shared with them immediately following your visit.  Consent: (Patient) Danielle Lewis provided verbal consent for this virtual visit at the beginning of the encounter.  Current Medications:  Current Outpatient Medications:    benzonatate (TESSALON) 100 MG capsule, Take 1-2 capsules (100-200 mg total) by mouth 3 (three) times daily as needed for cough., Disp: 30 capsule, Rfl: 0   doxycycline (VIBRA-TABS) 100 MG tablet, Take 1 tablet (100 mg total) by mouth 2 (two) times daily., Disp: 14 tablet, Rfl: 0   ALPRAZolam (XANAX) 0.5 MG tablet, Take 0.5 mg by mouth in the morning and at bedtime., Disp: , Rfl:    amLODipine (NORVASC) 5 MG tablet, Take 1 tablet (5 mg total) by mouth daily. (Patient taking differently: Take 5 mg by mouth at bedtime.), Disp: 30 tablet, Rfl: 0   cetirizine (ZYRTEC) 10 MG tablet, Take 10 mg by mouth daily as needed for allergies., Disp: , Rfl:    escitalopram (LEXAPRO) 20 MG tablet, Take 20 mg by mouth at bedtime., Disp: , Rfl:    naproxen (NAPROSYN) 500 MG tablet, Take 500 mg by mouth 2 (two) times daily., Disp: , Rfl:    omeprazole (PRILOSEC) 20 MG capsule, Take 20 mg by mouth daily., Disp: , Rfl:    oxyCODONE-acetaminophen (PERCOCET) 5-325 MG tablet, Take 1 tablet by mouth every 4 (four) hours as needed for severe pain., Disp: 30 tablet, Rfl: 0   zolpidem (AMBIEN CR) 6.25 MG CR tablet, Take 1 tablet (6.25 mg total) by mouth at bedtime as needed. for sleep, Disp: 30 tablet, Rfl: 0   Medications ordered in this encounter:  Meds ordered this encounter  Medications   doxycycline (VIBRA-TABS) 100 MG tablet    Sig: Take 1 tablet (100 mg total) by mouth 2 (two) times daily.    Dispense:  14 tablet    Refill:  0    Order Specific Question:    Supervising Provider    Answer:   MILLER, BRIAN [[id number]]   benzonatate (TESSALON) 100 MG capsule    Sig: Take 1-2 capsules (100-200 mg total) by mouth 3 (three) times daily as needed for cough.    Dispense:  30 capsule    Refill:  0    Order Specific Question:   Supervising Provider    Answer:   Sabra Heck, [place]     *If you need refills on other medications prior to your next appointment, please contact your pharmacy*  Follow-Up: Call back or seek an in-person evaluation if the symptoms worsen or if the condition fails to improve as anticipated.  Other Instructions Take antibiotic (Doxycycline) as directed.  Increase fluids.  Get plenty of rest. Use Mucinex for congestion. Take the Tessalon according to new dosing instructions -- a refill was sent in. Take a daily probiotic (I recommend Align or Culturelle, but even Activia Yogurt may be beneficial).  A humidifier placed in the bedroom may offer some relief for a dry, scratchy throat of nasal irritation.  Read information below on acute bronchitis. Please call or return to clinic if symptoms are not improving.  Acute Bronchitis Bronchitis is when the airways that extend from the windpipe into the lungs get red, puffy, and painful (inflamed). Bronchitis often causes thick spit (mucus) to develop. This leads to  a cough. A cough is the most common symptom of bronchitis. In acute bronchitis, the condition usually begins suddenly and goes away over time (usually in 2 weeks). Smoking, allergies, and asthma can make bronchitis worse. Repeated episodes of bronchitis may cause more lung problems.  HOME CARE Rest. Drink enough fluids to keep your pee (urine) clear or pale yellow (unless you need to limit fluids as told by your doctor). Only take over-the-counter or prescription medicines as told by your doctor. Avoid smoking and secondhand smoke. These can make bronchitis worse. If you are a smoker, think about using nicotine gum or skin patches.  Quitting smoking will help your lungs heal faster. Reduce the chance of getting bronchitis again by: Washing your hands often. Avoiding people with cold symptoms. Trying not to touch your hands to your mouth, nose, or eyes. Follow up with your doctor as told.  GET HELP IF: Your symptoms do not improve after 1 week of treatment. Symptoms include: Cough. Fever. Coughing up thick spit. Body aches. Chest congestion. Chills. Shortness of breath. Sore throat.  GET HELP RIGHT AWAY IF:  You have an increased fever. You have chills. You have severe shortness of breath. You have bloody thick spit (sputum). You throw up (vomit) often. You lose too much body fluid (dehydration). You have a severe headache. You faint.  MAKE SURE YOU:  Understand these instructions. Will watch your condition. Will get help right away if you are not doing well or get worse. Document Released: 01/30/2008 Document Revised: 04/15/2013 Document Reviewed: 02/03/2013 [facility] Patient Information 2015 [place], [place]. This information is not intended to replace advice given to you by your health care provider. Make sure you discuss any questions you have with your health care provider.    If you have been instructed to have an in-person evaluation today at a local Urgent Care facility, please use the link below. It will take you to a list of all of our available [facility], including address, phone number and hours of operation. Please do not delay care.  [facility]  If you or a family member do not have a primary care provider, use the link below to schedule a visit and establish care. When you choose a [facility] primary care physician or advanced practice provider, you gain a long-term partner in health. Find a Primary Care Provider  Learn more about 's in-office and virtual care options: [facility]

## 2021-08-16 ENCOUNTER — Other Ambulatory Visit: Payer: Self-pay

## 2021-08-16 ENCOUNTER — Encounter: Payer: Self-pay | Admitting: Obstetrics and Gynecology

## 2021-08-16 ENCOUNTER — Other Ambulatory Visit: Payer: Self-pay | Admitting: Internal Medicine

## 2021-08-16 ENCOUNTER — Ambulatory Visit (INDEPENDENT_AMBULATORY_CARE_PROVIDER_SITE_OTHER): Payer: 59 | Admitting: Obstetrics and Gynecology

## 2021-08-16 VITALS — BP 145/96 | Ht 63.0 in | Wt 198.0 lb

## 2021-08-16 DIAGNOSIS — Z4889 Encounter for other specified surgical aftercare: Secondary | ICD-10-CM

## 2021-08-16 NOTE — Progress Notes (Signed)
Postoperative Follow-up Patient presents post op from laparoscopic bilateral salpingectomy 6weeks ago for adnexal mass.  Subjective: Patient reports marked improvement in her preop symptoms. Eating a regular diet without difficulty. The patient is not having any pain.  Activity: normal activities of daily living.  Objective: Blood pressure (!) 145/96, height 5\' 3"  (1.6 m), weight 198 lb (89.8 kg).   General: NAD Pulmonary: no increased work of breathing Abdomen: soft, non-tender, non-distended, incision(s) D/C/I Extremities: no edema Neurologic: normal gait   Admission on 07/06/2021, Discharged on 07/06/2021  Component Date Value Ref Range Status   ABO/RH(D) 07/06/2021    Final                   Value:O POS Performed at [facility], [address] [place], [place] [address]    SURGICAL PATHOLOGY 07/06/2021    Final-Edited                   Value:SURGICAL PATHOLOGY CASE: [id number] PATIENT: [facility] Report     Specimen Submitted: A. Bilateral tubes and bilateral ovaries  Clinical History: Right ovarian cyst N83.201      DIAGNOSIS: A. BILATERAL OVARIES AND FALLOPIAN TUBES; BILATERAL SALPINGO-OOPHORECTOMY: - BILATERAL OVARIES WITH SEROUS CYSTADENOMA, MULTIFOCAL. - PORTIONS OF FALLOPIAN TUBE WITH FIMBRIATED END. - NEGATIVE FOR ATYPIA AND MALIGNANCY.  GROSS DESCRIPTION: A. Labeled: Bilateral tubes and ovaries Received: Fresh Collection time: 1:26 PM on 07/06/2021 Placed into formalin time: 1:48 PM on 07/06/2021 Type of procedure: Bilateral salpingo-oophorectomy Integrity: The ovaries each have focal areas of disruption. Weight of specimen: Adnexa A: 2.74 g; adnexa B: 4.47 g grams Size of specimen:           Ovary: Ovary A: 3.3 x 1.2 x 1 cm; ovary B: 3.7 x 2.2 x 1 cm           Fallopian tube: Fallopian tube A: 1.7 cm long by 0.3 cm in diameter; fallopian tube B: Aggregate,                          2 x 1.5 x 0.5  cm Ovarian external surface: Ovary A has a white-pink and cerebriform serosa with multiple unilocular cysts ranging from 0.1 to 0.7 cm in greatest dimension.  There is a 2.7 x 1 cm area of roughened and cauterized disruption (inked blue).  Ovary B has a white-pink and cerebriform serosa with multiple unilocular cysts ranging from 0.1 to 2.4 cm in greatest dimension.  There is a 2.5 x 1.6 cm area of roughened and cauterized disruption (inked black). Ovarian internal surface: Ovary A has a tan-purple cut surface with multiple additional unilocular cysts.  No distinct additional masses or lesions are grossly identified.  Ovary B has a tan-pink cut surface with multiple additional unilocular cysts and an additional white cystlike area, 0.5 x 0.4 x 0.4 cm.  No distinct additional masses or lesions are grossly identified. Fallopian tube lumen: Presumed fallopian tube A is received densely adherent to the ovary.  There is a portion of tan-pink potential fimbria a                         nd a potential lumen, dilated up to 0.2 cm in diameter.  There are 2 adjacent cysts ranging from 0.8 to 1 cm in greatest dimension.  Presumed fallopian tube B is received densely adherent to the ovary.  There  are 3 fragments of tan-pink potential fimbria surrounding the largest cyst.  A distinct portion of lumen is not grossly appreciated.  Block summary: 1 - 2 - representative ovary A 3 - potential fallopian tube A fimbria, longitudinally sectioned and submitted entirely 4 - potential fallopian tube A cross-sections, submitted entirely 5 - 7 - representative ovary B 8 - potential fallopian tube B fimbria, submitted entirely  RB 07/06/2021  Final Diagnosis performed by Quay Burow, MD.   Electronically signed 07/07/2021 4:24:05PM The electronic signature indicates that the named Attending Pathologist has evaluated the specimen Technical component performed at [facility], [address] [place], [place]  [address] Lab: [phone] Dir: Rush Farmer, MD, MMM  Pro[facility]                         nal component performed at [facility], [facility], [place], [place], [place] [address] Lab: [phone] Dir: Kathi Simpers, MD     Assessment: 65 y.o. s/p laparoscopic bilateral oophorectomy stable  Plan: Patient has done well after surgery with no apparent complications.  I have discussed the post-operative course to date, and the expected progress moving forward.  The patient understands what complications to be concerned about.  I will see the patient in routine follow up, or sooner if needed.    Activity plan: No restriction.   Malachy Mood, MD, [facility], [facility] 08/16/2021, 9:03 AM

## 2021-08-18 ENCOUNTER — Encounter: Payer: Self-pay | Admitting: Internal Medicine

## 2021-08-24 ENCOUNTER — Encounter: Payer: Self-pay | Admitting: Internal Medicine

## 2021-09-21 ENCOUNTER — Encounter: Payer: Self-pay | Admitting: Internal Medicine

## 2021-09-22 ENCOUNTER — Inpatient Hospital Stay: Payer: 59

## 2021-09-22 ENCOUNTER — Inpatient Hospital Stay: Payer: 59 | Admitting: Internal Medicine

## 2021-10-06 ENCOUNTER — Inpatient Hospital Stay: Payer: Medicare Other | Attending: Internal Medicine

## 2021-10-06 ENCOUNTER — Inpatient Hospital Stay (HOSPITAL_BASED_OUTPATIENT_CLINIC_OR_DEPARTMENT_OTHER): Payer: Medicare Other | Admitting: Internal Medicine

## 2021-10-06 ENCOUNTER — Encounter: Payer: Self-pay | Admitting: Internal Medicine

## 2021-10-06 ENCOUNTER — Other Ambulatory Visit: Payer: Self-pay

## 2021-10-06 DIAGNOSIS — Z803 Family history of malignant neoplasm of breast: Secondary | ICD-10-CM | POA: Insufficient documentation

## 2021-10-06 DIAGNOSIS — Z8616 Personal history of COVID-19: Secondary | ICD-10-CM | POA: Diagnosis not present

## 2021-10-06 DIAGNOSIS — Z8572 Personal history of non-Hodgkin lymphomas: Secondary | ICD-10-CM | POA: Diagnosis present

## 2021-10-06 DIAGNOSIS — M2550 Pain in unspecified joint: Secondary | ICD-10-CM | POA: Insufficient documentation

## 2021-10-06 DIAGNOSIS — F419 Anxiety disorder, unspecified: Secondary | ICD-10-CM | POA: Diagnosis not present

## 2021-10-06 DIAGNOSIS — K219 Gastro-esophageal reflux disease without esophagitis: Secondary | ICD-10-CM | POA: Insufficient documentation

## 2021-10-06 DIAGNOSIS — C8218 Follicular lymphoma grade II, lymph nodes of multiple sites: Secondary | ICD-10-CM

## 2021-10-06 DIAGNOSIS — R232 Flushing: Secondary | ICD-10-CM | POA: Insufficient documentation

## 2021-10-06 DIAGNOSIS — Z79899 Other long term (current) drug therapy: Secondary | ICD-10-CM | POA: Insufficient documentation

## 2021-10-06 DIAGNOSIS — Z90722 Acquired absence of ovaries, bilateral: Secondary | ICD-10-CM | POA: Diagnosis not present

## 2021-10-06 LAB — CBC WITH DIFFERENTIAL/PLATELET
Abs Immature Granulocytes: 0.02 10*3/uL (ref 0.00–0.07)
Basophils Absolute: 0.1 10*3/uL (ref 0.0–0.1)
Basophils Relative: 1 %
Eosinophils Absolute: 0.2 10*3/uL (ref 0.0–0.5)
Eosinophils Relative: 4 %
HCT: 41 % (ref 36.0–46.0)
Hemoglobin: 14.3 g/dL (ref 12.0–15.0)
Immature Granulocytes: 0 %
Lymphocytes Relative: 28 %
Lymphs Abs: 1.7 10*3/uL (ref 0.7–4.0)
MCH: 31.2 pg (ref 26.0–34.0)
MCHC: 34.9 g/dL (ref 30.0–36.0)
MCV: 89.3 fL (ref 80.0–100.0)
Monocytes Absolute: 0.7 10*3/uL (ref 0.1–1.0)
Monocytes Relative: 12 %
Neutro Abs: 3.4 10*3/uL (ref 1.7–7.7)
Neutrophils Relative %: 55 %
Platelets: 211 10*3/uL (ref 150–400)
RBC: 4.59 MIL/uL (ref 3.87–5.11)
RDW: 13.2 % (ref 11.5–15.5)
WBC: 6.1 10*3/uL (ref 4.0–10.5)
nRBC: 0 % (ref 0.0–0.2)

## 2021-10-06 LAB — COMPREHENSIVE METABOLIC PANEL
ALT: 39 U/L (ref 0–44)
AST: 38 U/L (ref 15–41)
Albumin: 4.3 g/dL (ref 3.5–5.0)
Alkaline Phosphatase: 95 U/L (ref 38–126)
Anion gap: 6 (ref 5–15)
BUN: 16 mg/dL (ref 8–23)
CO2: 22 mmol/L (ref 22–32)
Calcium: 9 mg/dL (ref 8.9–10.3)
Chloride: 108 mmol/L (ref 98–111)
Creatinine, Ser: 0.96 mg/dL (ref 0.44–1.00)
GFR, Estimated: >60 mL/min (ref >60)
Glucose, Bld: 132 mg/dL — ABNORMAL HIGH (ref 70–99)
Potassium: 3.7 mmol/L (ref 3.5–5.1)
Sodium: 136 mmol/L (ref 135–145)
Total Bilirubin: 0.7 mg/dL (ref 0.3–1.2)
Total Protein: 7.1 g/dL (ref 6.5–8.1)

## 2021-10-06 LAB — LACTATE DEHYDROGENASE: LDH: 143 U/L (ref 98–192)

## 2021-10-06 NOTE — Assessment & Plan Note (Addendum)
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  July 2022-CT scan neck chest abdomen pelvis-negative for any progressive adenopathy/or any concerns for new lymphadenopathy or lesions.  #Clinically evidence of recurrence.  Will plan to repeat CT scan in 3 months- N-CAP.   # port/IV access- explanted.   # DISPOSITION:   # follow up in 3 months;MD; labs- cbc/cmp/LDH; CT CAP-NECK- Dr.B    Cc; Dr.Linthavong/Ms. fields NP.

## 2021-10-06 NOTE — Progress Notes (Signed)
[facility] CONSULT NOTE  Patient Care Team: Peggye Form, NP as PCP - General (Family Medicine) Herbert Pun, MD as Consulting Physician (General Surgery) Cammie Sickle, MD as Consulting Physician (Internal Medicine) Herbert Pun, MD as Consulting Physician (General Surgery)  CHIEF COMPLAINTS/PURPOSE OF CONSULTATION:   Oncology History Overview Note  # MAY 2021- Bilateral neck adenopathy right more than left; highly concerning for lymphoproliferative disorder.  Excisional biopsy right neck- [Dr.Cintron];  A. 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   B. 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   # MAY 2021-CBC/CMP normal /  mildly elevated alkaline phosphatase. [PCP; [facility]].  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[place])   03/02/2020 -  Chemotherapy   The patient had dexamethasone (DECADRON) 4 MG tablet, 8 mg, Oral, Daily, 1 of 1 cycle, Start date: --, End date: -- palonosetron (ALOXI) injection 0.25 mg, 0.25 mg, Intravenous,  Once, 6 of 6 cycles Administration: 0.25 mg (03/02/2020), 0.25 mg (04/04/2020), 0.25 mg (05/03/2020), 0.25 mg (07/26/2020), 0.25 mg (06/28/2020), 0.25 mg (08/23/2020) pegfilgrastim-cbqv (UDENYCA) injection 6 mg, 6 mg, Subcutaneous, Once, 3 of 3 cycles Administration: 6 mg (06/30/2020), 6 mg (07/28/2020), 6 mg (08/25/2020) bendamustine (BENDEKA) 175 mg in sodium chloride 0.9 % 50 mL (3.0702 mg/mL) chemo infusion, 90 mg/m2 = 175 mg, Intravenous,  Once, 6 of 6 cycles Administration: 175 mg (03/02/2020), 175 mg  (03/03/2020), 175 mg (04/04/2020), 175 mg (04/05/2020), 175 mg (05/03/2020), 175 mg (05/04/2020), 175 mg (07/26/2020), 175 mg (07/27/2020), 175 mg (06/28/2020), 175 mg (06/29/2020), 175 mg (08/23/2020), 175 mg (08/24/2020) riTUXimab-pvvr (RUXIENCE) 700 mg in sodium chloride 0.9 % 250 mL (2.1875 mg/mL) infusion, 375 mg/m2 = 700 mg, Intravenous,  Once, 2 of 2 cycles Administration: 700 mg (03/02/2020)   for chemotherapy treatment.      HISTORY OF PRESENTING ILLNESS: Alone.  Ambulating dependently.  Danielle Lewis 66 y.o.  female  follicular lymphoma grade 1-2 currently on bendamustine Rituxan is here for follow-up.  In the interim patient evaluated by gynecology-underwent bilateral nephrectomy.  Negative for malignancy.  Patient also underwent evaluation with surgery s/p lumpectomy-no evidence of malignancy.  Patient denies any new lumps or bumps.  Denies any nausea vomiting abdominal pain.  Continues to have hot flashes.  Continues to have chronic joint pains.  Also chronic fatigue.  Review of Systems  Constitutional:  Positive for diaphoresis and malaise/fatigue. Negative for chills, fever and weight loss.  HENT:  Negative for nosebleeds and sore throat.   Eyes:  Negative for double vision.  Respiratory:  Negative for cough, hemoptysis, sputum production, shortness of breath and wheezing.   Cardiovascular:  Negative for chest pain, palpitations, orthopnea and leg swelling.  Gastrointestinal:  Negative for abdominal pain, blood in stool, constipation, diarrhea, heartburn, melena, nausea and vomiting.  Genitourinary:  Negative for dysuria, frequency and urgency.  Musculoskeletal:  Negative for back pain and joint pain.  Skin: Negative.  Negative for itching and rash.  Neurological:  Negative for dizziness, tingling, focal weakness, weakness and headaches.  Endo/Heme/Allergies:  Does  not bruise/bleed easily.  Psychiatric/Behavioral:  Negative for depression. The patient has insomnia. The patient is not  nervous/anxious.     MEDICAL HISTORY:  Past Medical History:  Diagnosis Date   Anxiety    Arthritis    Cancer ([place])    COVID-19 2021   Depression    GERD (gastroesophageal reflux disease)    Headache    Hypertension    Pneumonia     SURGICAL HISTORY: Past Surgical History:  Procedure Laterality Date   ABDOMINAL HYSTERECTOMY     APPENDECTOMY     BREAST BIOPSY Right 04/28/2021   U/s bx 7:00/2cmfn"heart" marker-path pending   CHOLECYSTECTOMY     28years ago   [place] OF BREAST BIOPSY Right 05/19/2021   Procedure: EXCISION OF BREAST BIOPSY w/ Radio frequency tag;  Surgeon: Herbert Pun, MD;  Location: [facility] ORS;  Service: General;  Laterality: Right;   LAPAROSCOPIC BILATERAL SALPINGO OOPHERECTOMY Bilateral 07/06/2021   Procedure: LAPAROSCOPIC BILATERAL SALPINGO OOPHORECTOMY WITH CYSTOSCOPY;  Surgeon: Malachy Mood, MD;  Location: [facility] OR[facility];  Service: Gynecology;  Laterality: Bilateral;   LYMPH GLAND EXCISION Right 01/22/2020   Procedure: CERVICAL LYMPH GLAND EXCISION;  Surgeon: Herbert Pun, MD;  Location: [facility] ORS;  Service: General;  Laterality: Right;   PORTACATH PLACEMENT N/A 02/24/2020   Procedure: INSERTION PORT-A-CATH;  Surgeon: Herbert Pun, MD;  Location: [facility] ORS;  Service: General;  Laterality: N/A;   TONSILLECTOMY      SOCIAL HISTORY: Social History   Socioeconomic History   Marital status: Married    Spouse name: Not on file   Number of children: Not on file   Years of education: Not on file   Highest education level: Not on file  Occupational History   Not on file  Tobacco Use   Smoking status: Never   Smokeless tobacco: Never  Vaping Use   Vaping Use: Never used  Substance and Sexual Activity   Alcohol use: Not Currently    Alcohol/week: 1.0 standard drink    Types: 1 Glasses of wine per week   Drug use: Not Currently   Sexual activity: Yes    Birth control/protection: Surgical     Comment: Tubal  Other Topics Concern   Not on file  Social History Narrative   Lives in [place]; [place]. Never smoked; wine rarely. Used in work in [employer]. With husband.    Social Determinants of Health   Financial Resource Strain: Not on file  Food Insecurity: Not on file  Transportation Needs: Not on file  Physical Activity: Not on file  Stress: Not on file  Social Connections: Not on file  Intimate Partner Violence: Not on file    FAMILY HISTORY: Family History  Problem Relation Age of Onset   Breast cancer Mother    Cancer Mother        unknown type    ALLERGIES:  is allergic to penicillins.  MEDICATIONS:  Current Outpatient Medications  Medication Sig Dispense Refill   ALPRAZolam (XANAX) 0.5 MG tablet Take 0.5 mg by mouth in the morning and at bedtime.     amLODipine (NORVASC) 5 MG tablet Take 1 tablet by mouth once daily 30 tablet 0   cetirizine (ZYRTEC) 10 MG tablet Take 10 mg by mouth daily as needed for allergies.     escitalopram (LEXAPRO) 20 MG tablet Take 20 mg by mouth at bedtime.     naproxen (NAPROSYN) 500 MG tablet Take 500 mg by mouth 2 (  two) times daily.     omeprazole (PRILOSEC) 20 MG capsule Take 20 mg by mouth daily.     zolpidem (AMBIEN CR) 6.25 MG CR tablet Take 1 tablet (6.25 mg total) by mouth at bedtime as needed. for sleep 30 tablet 0   No current facility-administered medications for this visit.      Marland Kitchen  PHYSICAL EXAMINATION: ECOG PERFORMANCE STATUS: 0 - Asymptomatic  Vitals:   10/06/21 1446  BP: (!) 148/89  Pulse: 89  Temp: 98.3 F (36.8 C)  SpO2: 98%    Filed Weights   10/06/21 1446  Weight: 203 lb 1.6 oz (92.1 kg)     Physical Exam Constitutional:      Comments: Walk independently.  Accompanied by family.  HENT:     Head: Normocephalic and atraumatic.     Mouth/Throat:     Pharynx: No oropharyngeal exudate.  Eyes:     Pupils: Pupils are equal, round, and reactive to light.  Neck:     Comments: Neck  lymphadenopathy improved. Cardiovascular:     Rate and Rhythm: Normal rate and regular rhythm.  Pulmonary:     Effort: Pulmonary effort is normal. No respiratory distress.     Breath sounds: Normal breath sounds. No wheezing.  Abdominal:     General: Bowel sounds are normal. There is no distension.     Palpations: Abdomen is soft. There is no mass.     Tenderness: There is no abdominal tenderness. There is no guarding or rebound.  Musculoskeletal:        General: No tenderness. Normal range of motion.     Cervical back: Normal range of motion and neck supple.  Skin:    General: Skin is warm.  Neurological:     Mental Status: She is alert and oriented to person, place, and time.  Psychiatric:        Mood and Affect: Affect normal.     LABORATORY DATA:  I have reviewed the data as listed Lab Results  Component Value Date   WBC 6.1 10/06/2021   HGB 14.3 10/06/2021   HCT 41.0 10/06/2021   MCV 89.3 10/06/2021   PLT 211 10/06/2021   Recent Labs    12/12/20 1330 03/22/21 1122 03/24/21 1435 07/03/21 1318 10/06/21 1435  NA 139  --  137 139 136  K 3.3*  --  4.0 3.3* 3.7  CL 107  --  107 98 108  CO2 22  --  21* 25 22  GLUCOSE 119*  --  76 107* 132*  BUN 17  --  17 17 16   CREATININE 0.85   < > 0.92 0.99 0.96  CALCIUM 9.4  --  9.5 8.6* 9.0  GFRNONAA >60  --  >60 >60 >60  PROT 6.5  --  7.1  --  7.1  ALBUMIN 4.2  --  4.6  --  4.3  AST 34  --  42*  --  38  ALT 26  --  46*  --  39  ALKPHOS 89  --  96  --  95  BILITOT 0.9  --  0.6  --  0.7   < > = values in this interval not displayed.    RADIOGRAPHIC STUDIES: I have personally reviewed the radiological images as listed and agreed with the findings in the report. No results found.  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  July 2022-CT scan neck chest abdomen pelvis-negative for any  progressive adenopathy/or any concerns for new lymphadenopathy or lesions.  #Clinically evidence of recurrence.  Will plan to repeat CT scan in 3 months- N-CAP.   # port/IV access- explanted.   # DISPOSITION:   # follow up in 3 months;MD; labs- cbc/cmp/LDH; CT CAP-NECK- Dr.B    Cc; Dr.Linthavong/Ms. fields NP.    All questions were answered. The patient knows to call the clinic with any problems, questions or concerns.    Cammie Sickle, MD 10/06/2021 4:12 PM

## 2021-10-06 NOTE — Progress Notes (Signed)
Pt states she is being seen for chest pain and having an u/s 11/02/21 of her heart.  Also, she is having some SOB.

## 2021-10-11 ENCOUNTER — Other Ambulatory Visit: Payer: Self-pay | Admitting: Family Medicine

## 2021-10-11 DIAGNOSIS — M545 Low back pain, unspecified: Secondary | ICD-10-CM

## 2021-10-13 ENCOUNTER — Other Ambulatory Visit: Payer: Self-pay | Admitting: Family Medicine

## 2021-10-13 DIAGNOSIS — M545 Low back pain, unspecified: Secondary | ICD-10-CM

## 2021-10-23 ENCOUNTER — Telehealth: Payer: 59 | Admitting: Physician Assistant

## 2021-10-23 DIAGNOSIS — R0602 Shortness of breath: Secondary | ICD-10-CM

## 2021-10-23 DIAGNOSIS — R490 Dysphonia: Secondary | ICD-10-CM

## 2021-10-23 DIAGNOSIS — R051 Acute cough: Secondary | ICD-10-CM

## 2021-10-23 NOTE — Patient Instructions (Signed)
Danielle Lewis, thank you for joining Mar Daring, PA-C for today's virtual visit.  While this provider is not your primary care provider (PCP), if your PCP is located in our provider database this encounter information will be shared with them immediately following your visit.  Consent: (Patient) Danielle Lewis provided verbal consent for this virtual visit at the beginning of the encounter.  Current Medications:  Current Outpatient Medications:    ALPRAZolam (XANAX) 0.5 MG tablet, Take 0.5 mg by mouth in the morning and at bedtime., Disp: , Rfl:    amLODipine (NORVASC) 5 MG tablet, Take 1 tablet by mouth once daily, Disp: 30 tablet, Rfl: 0   cetirizine (ZYRTEC) 10 MG tablet, Take 10 mg by mouth daily as needed for allergies., Disp: , Rfl:    escitalopram (LEXAPRO) 20 MG tablet, Take 20 mg by mouth at bedtime., Disp: , Rfl:    naproxen (NAPROSYN) 500 MG tablet, Take 500 mg by mouth 2 (two) times daily., Disp: , Rfl:    omeprazole (PRILOSEC) 20 MG capsule, Take 20 mg by mouth daily., Disp: , Rfl:    zolpidem (AMBIEN CR) 6.25 MG CR tablet, Take 1 tablet (6.25 mg total) by mouth at bedtime as needed. for sleep, Disp: 30 tablet, Rfl: 0   Medications ordered in this encounter:  No orders of the defined types were placed in this encounter.    *If you need refills on other medications prior to your next appointment, please contact your pharmacy*  Follow-Up: Call back or seek an in-person evaluation if the symptoms worsen or if the condition fails to improve as anticipated.  Other Instructions Based on what you shared with me, I feel your condition warrants further evaluation and I recommend that you be seen in a face to face visit.  If you are having a true medical emergency please call 911.      For an urgent face to face visit, [place] [facility] has six urgent care centers for your convenience:     [facility] [place] [facility] [place] Get Driving Directions [phone] [address] [place], [place] [address]    [facility]) Get Driving Directions [phone] [place], [place] [address]  [facility] [place] [facility]) Get Driving Directions [phone] [address] [place],  [place]  [address]  [facility] Get Driving Directions [phone] [address] [place], [place], [place] [address]   [facility] Get Driving Directions  [phone] [address], [place] [address]   [facility] [place] Get Driving Directions [phone] [address], [place], [place] [address]    If you have been instructed to have an in-person evaluation today at a local Urgent Care facility, please use the link below. It will take you to a list of all of our available [facility], including address, phone number and hours of operation. Please do not delay care.  [facility]  If you or a family member do not have a primary care provider, use the link below to schedule a visit and establish care. When you choose a [facility] primary care physician or advanced practice provider, you gain a long-term partner in health. Find a Primary Care Provider  Learn more about 's in-office and virtual care options: [facility]  Now

## 2021-10-23 NOTE — Progress Notes (Signed)
Virtual Visit Consent   Danielle Lewis, you are scheduled for a virtual visit with a [facility] provider today.     Just as with appointments in the office, your consent must be obtained to participate.  Your consent will be active for this visit and any virtual visit you may have with one of our providers in the next 365 days.     If you have a MyChart account, a copy of this consent can be sent to you electronically.  All virtual visits are billed to your insurance company just like a traditional visit in the office.    As this is a virtual visit, video technology does not allow for your provider to perform a traditional examination.  This may limit your provider's ability to fully assess your condition.  If your provider identifies any concerns that need to be evaluated in person or the need to arrange testing (such as labs, EKG, etc.), we will make arrangements to do so.     Although advances in technology are sophisticated, we cannot ensure that it will always work on either your end or our end.  If the connection with a video visit is poor, the visit may have to be switched to a telephone visit.  With either a video or telephone visit, we are not always able to ensure that we have a secure connection.     I need to obtain your verbal consent now.   Are you willing to proceed with your visit today?    Danielle Lewis has provided verbal consent on 10/23/2021 for a virtual visit (video or telephone).   Mar Daring, PA-C   Date: 10/23/2021 11:29 AM   Virtual Visit via Video Note   I, Mar Daring, connected with  Danielle Lewis  ([id number], Jul 11, 1956) on 10/23/21 at 11:30 AM EST by a video-enabled telemedicine application and verified that I am speaking with the correct person using two identifiers.  Location: Patient: Virtual Visit Location Patient: Home Provider: Virtual Visit Location Provider: Home Office   I discussed the limitations of evaluation and management by  telemedicine and the availability of in person appointments. The patient expressed understanding and agreed to proceed.    History of Present Illness: Danielle Lewis is a 66 y.o. who identifies as a female who was assigned female at birth, and is being seen today for cough and shortness of breath.  HPI: Cough This is a new problem. The current episode started in the past 7 days (2 days ago). The problem has been gradually worsening. The problem occurs constantly. The cough is Productive of sputum. Associated symptoms include headaches (one and took naproxen to help), nasal congestion, postnasal drip, shortness of breath (all the time; reports having significant SOB that effects her ambulating around her house) and wheezing. Pertinent negatives include no chills, ear congestion, ear pain, fever, myalgias, rhinorrhea or sore throat. Associated symptoms comments: Hoarse voice.     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[place]) 01/29/2020   Left bundle branch block (LBBB) on electrocardiogram 01/20/2020   Generalized enlarged lymph nodes 01/11/2020    Allergies:  Allergies  Allergen Reactions   Penicillins Rash   Medications:  Current Outpatient Medications:    ALPRAZolam (XANAX) 0.5 MG tablet, Take 0.5 mg by  mouth in the morning and at bedtime., Disp: , Rfl:    amLODipine (NORVASC) 5 MG tablet, Take 1 tablet by mouth once daily, Disp: 30 tablet, Rfl: 0   cetirizine (ZYRTEC) 10 MG tablet, Take 10 mg by mouth daily as needed for allergies., Disp: , Rfl:    escitalopram (LEXAPRO) 20 MG tablet, Take 20 mg by mouth at bedtime., Disp: , Rfl:    naproxen (NAPROSYN) 500 MG tablet, Take 500 mg by mouth 2 (two) times daily., Disp: , Rfl:    omeprazole (PRILOSEC) 20 MG capsule, Take 20 mg by mouth daily., Disp: ,  Rfl:    zolpidem (AMBIEN CR) 6.25 MG CR tablet, Take 1 tablet (6.25 mg total) by mouth at bedtime as needed. for sleep, Disp: 30 tablet, Rfl: 0  Observations/Objective: Patient is well-developed, well-nourished in no acute distress.  Resting comfortably at home.  Head is normocephalic, atraumatic.  No labored breathing noted, but patient reports significant SOB, even with little activity Hoarse voice Speech is clear and coherent with logical content.  Patient is alert and oriented at baseline.    Assessment and Plan: 1. Acute cough  2. Hoarseness of voice  3. SOB (shortness of breath)  - Patient advised to seek urgent evaluation at local urgent care due to reported significant SOB limiting activity; she agrees  Follow Up Instructions: I discussed the assessment and treatment plan with the patient. The patient was provided an opportunity to ask questions and all were answered. The patient agreed with the plan and demonstrated an understanding of the instructions.  A copy of instructions were sent to the patient via MyChart unless otherwise noted below.   The patient was advised to call back or seek an in-person evaluation if the symptoms worsen or if the condition fails to improve as anticipated.  Time:  I spent 10 minutes with the patient via telehealth technology discussing the above problems/concerns.    Mar Daring, PA-C

## 2021-10-25 ENCOUNTER — Other Ambulatory Visit: Payer: 59

## 2021-11-06 ENCOUNTER — Ambulatory Visit
Admission: RE | Admit: 2021-11-06 | Discharge: 2021-11-06 | Disposition: A | Discharge status: Home / Self Care | Payer: 59 | Source: Ambulatory Visit | Attending: Family Medicine | Admitting: Family Medicine

## 2021-11-06 ENCOUNTER — Other Ambulatory Visit: Payer: Self-pay

## 2021-11-06 DIAGNOSIS — M545 Low back pain, unspecified: Secondary | ICD-10-CM

## 2021-11-06 IMAGING — MR MR LUMBAR SPINE W/O CM
4 of 6 series · 22 of 48 positions shown · non-contrast
Comparison: None.

CLINICAL DATA: Acute midline low back pain without sciatica [Q4]
([Q4]-CM).

EXAM:
MRI LUMBAR SPINE WITHOUT CONTRAST
TECHNIQUE: Multiplanar, multisequence MR imaging of the lumbar spine was
performed. No intravenous contrast was administered.

[Series 6: T2 · sagittal · 4.0mm · 0.73mm/px · 5 of 17 slices shown (1 of 3)]
[im 1/17]
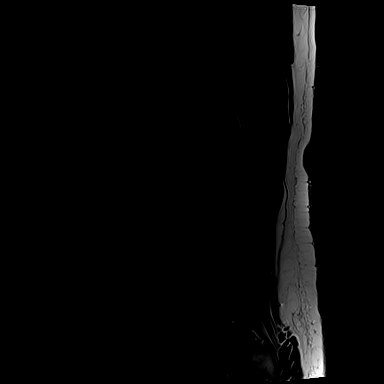
[im 5/17]
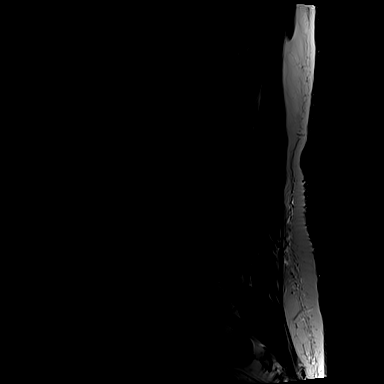
[im 9/17]
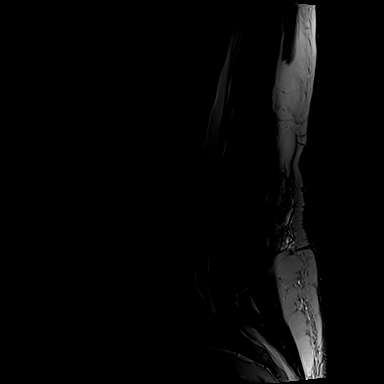
[im 13/17]
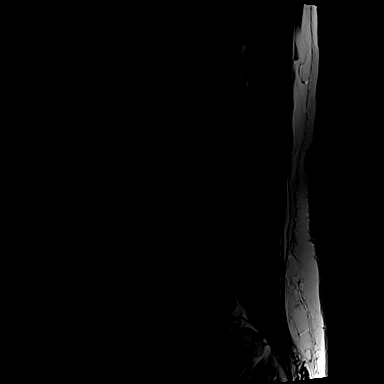
[im 17/17]
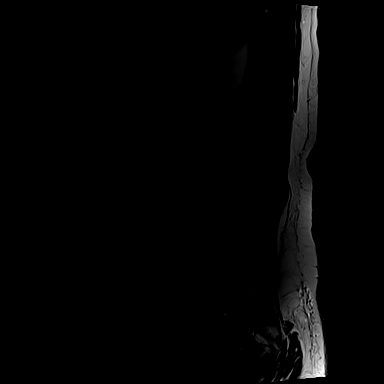

[Series 7: T1 · sagittal · 4.0mm · 0.73mm/px · 4 of 17 slices shown]
[im 1/17]
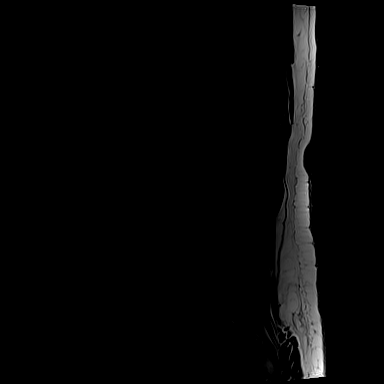
[im 5/17]
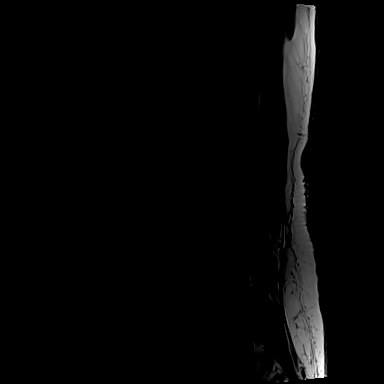
[im 9/17]
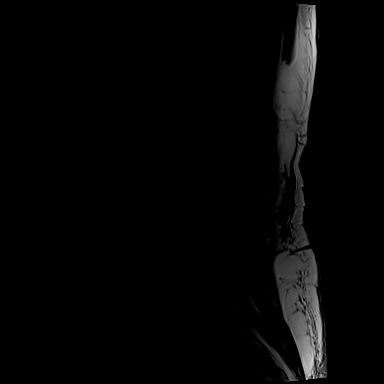
[im 17/17]
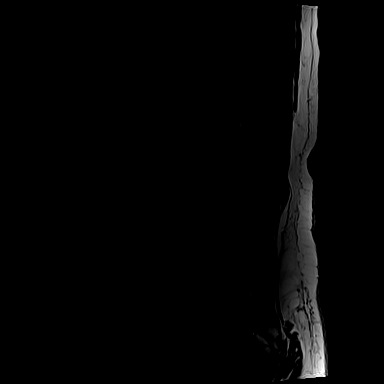

[Series 14: T2 · axial · 4.0mm · 0.28mm/px · z∈[-125,+111]mm · 8 of 46 slices shown (2 of 3)]
[im 1/46]
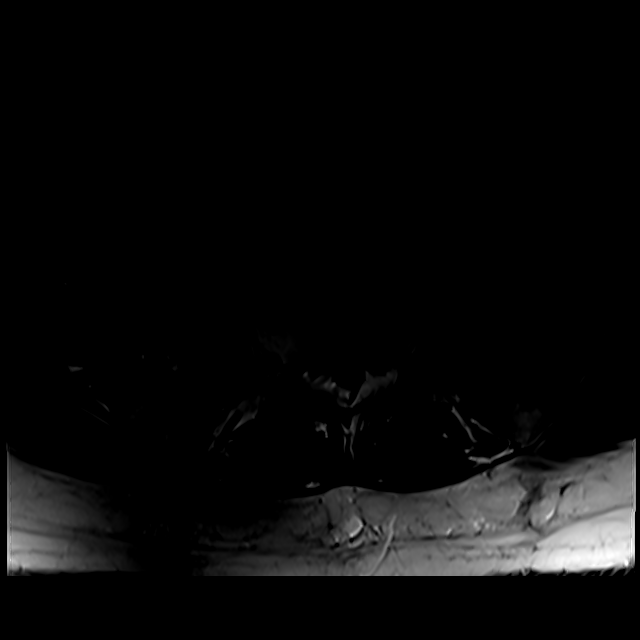
[im 7/46]
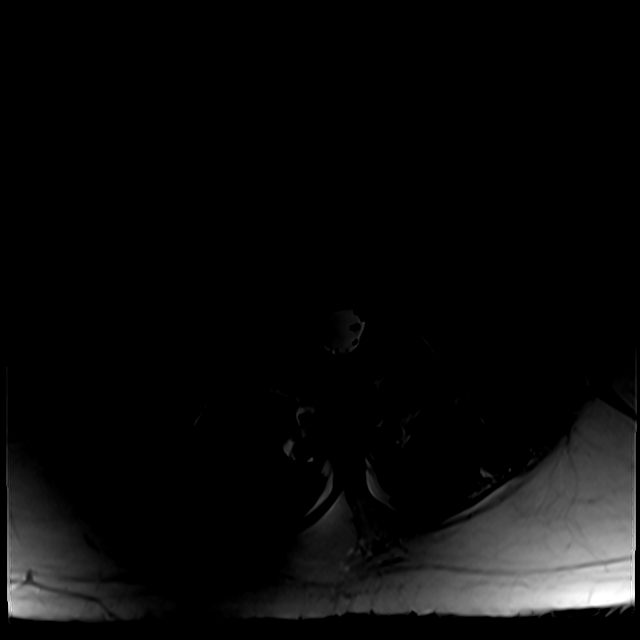
[im 14/46]
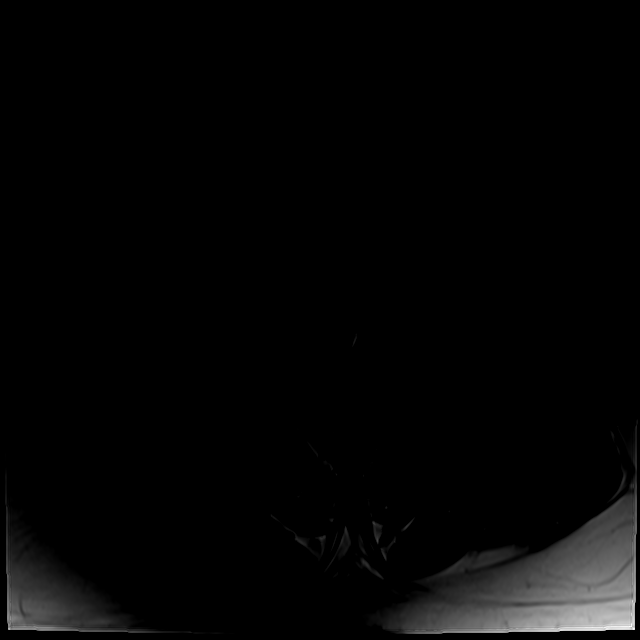
[im 21/46]
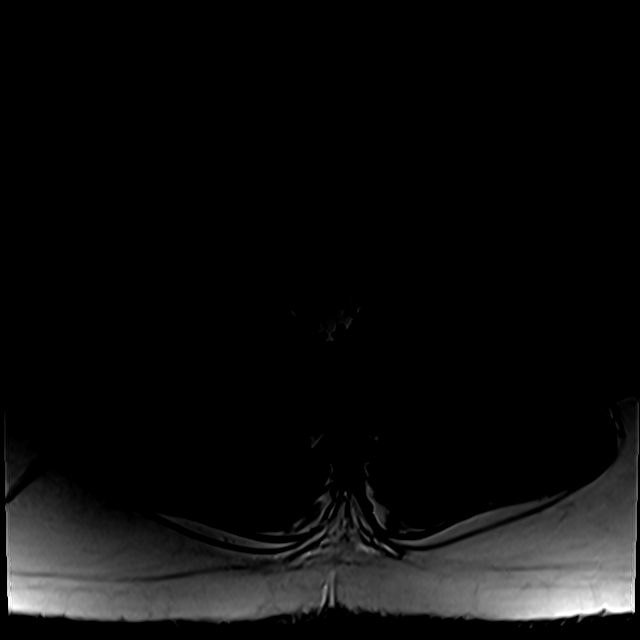
[im 25/46]
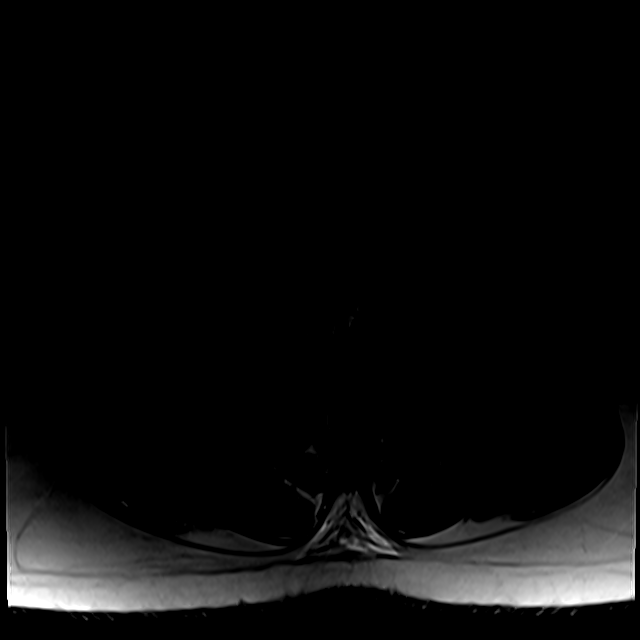
[im 32/46]
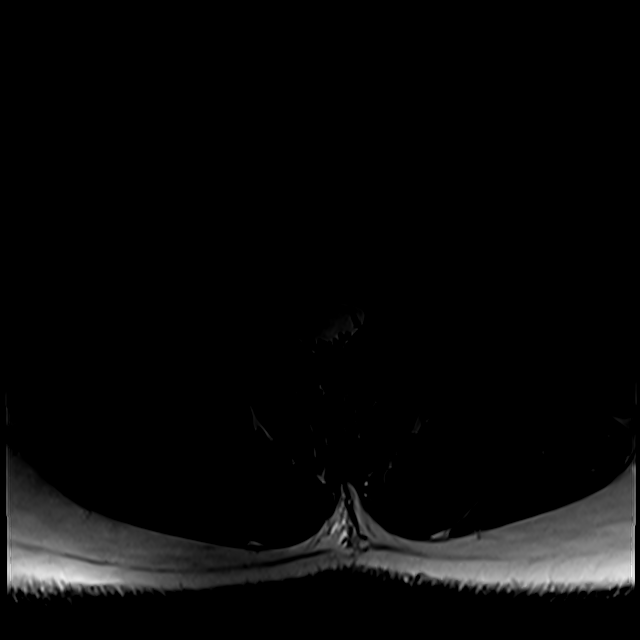
[im 39/46]
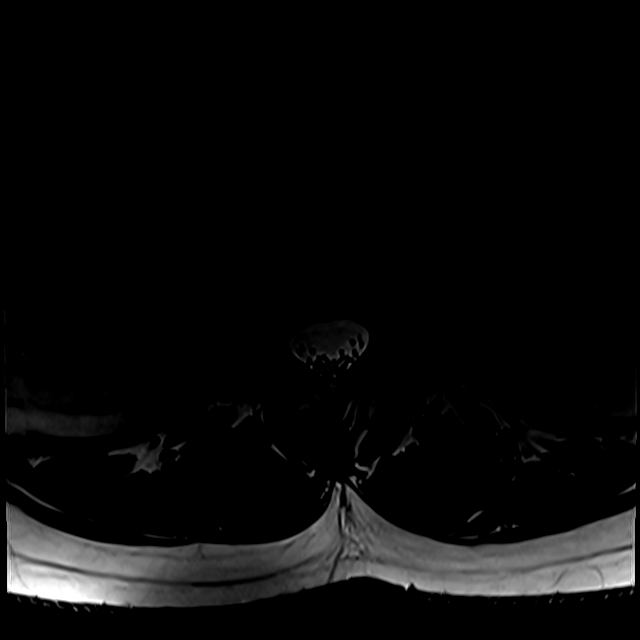
[im 46/46]
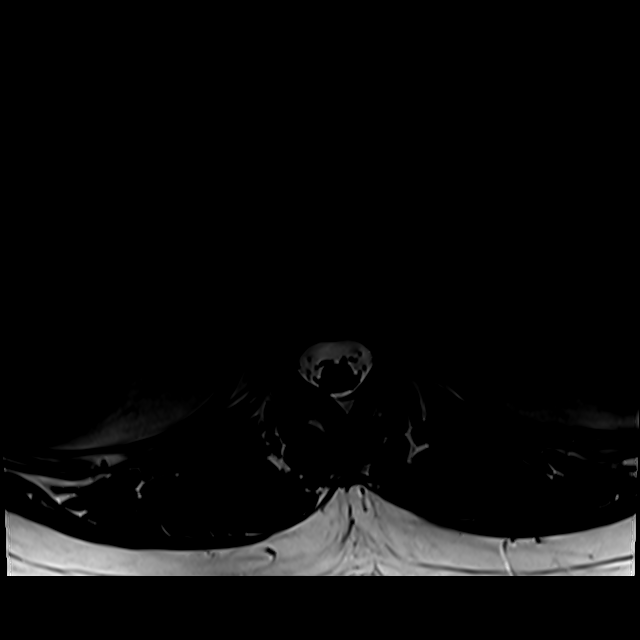

[Series 15: T2 · coronal · 5.0mm · 0.73mm/px · 5 of 17 slices shown (3 of 3)]
[im 1/17]
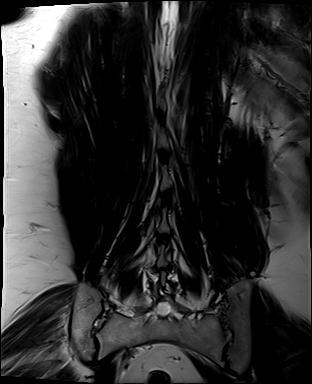
[im 5/17]
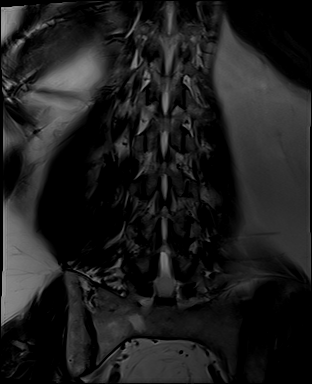
[im 9/17]
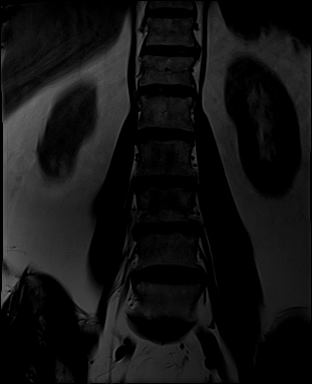
[im 13/17]
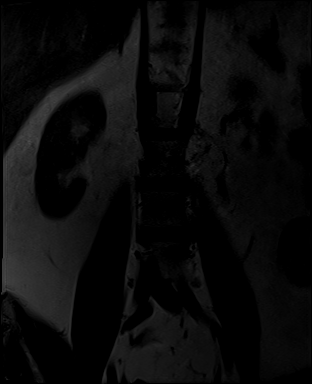
[im 17/17]
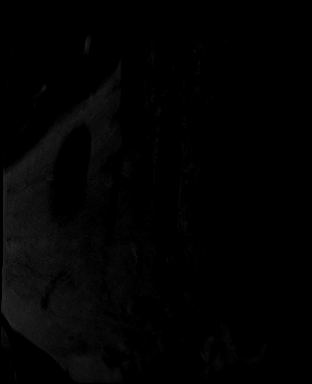

[22 of 48 positions shown; findings below may reference images not displayed]

FINDINGS: Segmentation:  Standard.

Alignment:  Physiologic.

Vertebrae: No fracture, evidence of discitis, or aggressive bone
lesion. Hemangioma in the L1 vertebral body.

Conus medullaris and cauda equina: Conus extends to the T12-L1
level. Conus and cauda equina appear normal.

Paraspinal and other soft tissues: Bilateral renal cysts.

Disc levels:

T12-L1: Tiny central disc protrusion. No significant spinal canal or
neural foraminal stenosis.

L1-2: No spinal canal or neural foraminal stenosis.

L2-3: Tiny right foraminal disc protrusion and mild facet
degenerative changes. No significant spinal canal or neural
foraminal stenosis.

L3-4: Shallow disc bulge and mild facet degenerative changes without
significant spinal canal or neural foraminal stenosis.

L4-5: Shallow disc bulge and mild facet degenerative changes without
significant spinal canal or neural foraminal stenosis.

L5-S1: Disc bulge with right foraminal/far lateral disc osteophyte
complex and mild facet degenerative changes resulting in moderate
right neural foraminal narrowing, impinging on the exiting right L5
nerve root. No significant spinal canal stenosis.
IMPRESSION: Mild degenerative changes of the lumbar spine, more pronounced at
L5-S1 where there is moderate right neural foraminal narrowing,
impinging on the exiting right L5 nerve root.

## 2021-12-06 DIAGNOSIS — R079 Chest pain, unspecified: Secondary | ICD-10-CM | POA: Insufficient documentation

## 2021-12-06 DIAGNOSIS — R0602 Shortness of breath: Secondary | ICD-10-CM | POA: Insufficient documentation

## 2022-01-03 ENCOUNTER — Ambulatory Visit: Admission: RE | Admit: 2022-01-03 | Payer: 59 | Source: Ambulatory Visit

## 2022-01-03 ENCOUNTER — Other Ambulatory Visit: Payer: Self-pay | Admitting: Family Medicine

## 2022-01-03 ENCOUNTER — Other Ambulatory Visit: Payer: 59

## 2022-01-03 DIAGNOSIS — N6002 Solitary cyst of left breast: Secondary | ICD-10-CM

## 2022-01-03 DIAGNOSIS — N83201 Unspecified ovarian cyst, right side: Secondary | ICD-10-CM

## 2022-01-05 ENCOUNTER — Inpatient Hospital Stay (HOSPITAL_BASED_OUTPATIENT_CLINIC_OR_DEPARTMENT_OTHER): Payer: Medicare PPO | Admitting: Internal Medicine

## 2022-01-05 ENCOUNTER — Inpatient Hospital Stay: Payer: Medicare PPO | Attending: Internal Medicine

## 2022-01-05 ENCOUNTER — Encounter: Payer: Self-pay | Admitting: Internal Medicine

## 2022-01-05 VITALS — BP 138/79 | HR 83 | Temp 97.9°F | Ht 63.0 in | Wt 195.4 lb

## 2022-01-05 DIAGNOSIS — Z923 Personal history of irradiation: Secondary | ICD-10-CM | POA: Insufficient documentation

## 2022-01-05 DIAGNOSIS — Z8616 Personal history of COVID-19: Secondary | ICD-10-CM | POA: Diagnosis not present

## 2022-01-05 DIAGNOSIS — R232 Flushing: Secondary | ICD-10-CM | POA: Diagnosis not present

## 2022-01-05 DIAGNOSIS — Z803 Family history of malignant neoplasm of breast: Secondary | ICD-10-CM | POA: Diagnosis not present

## 2022-01-05 DIAGNOSIS — Z79899 Other long term (current) drug therapy: Secondary | ICD-10-CM | POA: Diagnosis not present

## 2022-01-05 DIAGNOSIS — K219 Gastro-esophageal reflux disease without esophagitis: Secondary | ICD-10-CM | POA: Insufficient documentation

## 2022-01-05 DIAGNOSIS — R748 Abnormal levels of other serum enzymes: Secondary | ICD-10-CM | POA: Diagnosis not present

## 2022-01-05 DIAGNOSIS — I10 Essential (primary) hypertension: Secondary | ICD-10-CM | POA: Insufficient documentation

## 2022-01-05 DIAGNOSIS — M549 Dorsalgia, unspecified: Secondary | ICD-10-CM | POA: Insufficient documentation

## 2022-01-05 DIAGNOSIS — Z8572 Personal history of non-Hodgkin lymphomas: Secondary | ICD-10-CM | POA: Diagnosis present

## 2022-01-05 DIAGNOSIS — C8218 Follicular lymphoma grade II, lymph nodes of multiple sites: Secondary | ICD-10-CM

## 2022-01-05 LAB — CBC WITH DIFFERENTIAL/PLATELET
Abs Immature Granulocytes: 0.01 10*3/uL (ref 0.00–0.07)
Basophils Absolute: 0 10*3/uL (ref 0.0–0.1)
Basophils Relative: 1 %
Eosinophils Absolute: 0.2 10*3/uL (ref 0.0–0.5)
Eosinophils Relative: 3 %
HCT: 40.7 % (ref 36.0–46.0)
Hemoglobin: 14.1 g/dL (ref 12.0–15.0)
Immature Granulocytes: 0 %
Lymphocytes Relative: 29 %
Lymphs Abs: 1.8 10*3/uL (ref 0.7–4.0)
MCH: 31.1 pg (ref 26.0–34.0)
MCHC: 34.6 g/dL (ref 30.0–36.0)
MCV: 89.6 fL (ref 80.0–100.0)
Monocytes Absolute: 0.8 10*3/uL (ref 0.1–1.0)
Monocytes Relative: 13 %
Neutro Abs: 3.4 10*3/uL (ref 1.7–7.7)
Neutrophils Relative %: 54 %
Platelets: 213 10*3/uL (ref 150–400)
RBC: 4.54 MIL/uL (ref 3.87–5.11)
RDW: 13.6 % (ref 11.5–15.5)
WBC: 6.2 10*3/uL (ref 4.0–10.5)
nRBC: 0 % (ref 0.0–0.2)

## 2022-01-05 LAB — COMPREHENSIVE METABOLIC PANEL
ALT: 38 U/L (ref 0–44)
AST: 47 U/L — ABNORMAL HIGH (ref 15–41)
Albumin: 4 g/dL (ref 3.5–5.0)
Alkaline Phosphatase: 102 U/L (ref 38–126)
Anion gap: 9 (ref 5–15)
BUN: 18 mg/dL (ref 8–23)
CO2: 23 mmol/L (ref 22–32)
Calcium: 8.7 mg/dL — ABNORMAL LOW (ref 8.9–10.3)
Chloride: 107 mmol/L (ref 98–111)
Creatinine, Ser: 1.3 mg/dL — ABNORMAL HIGH (ref 0.44–1.00)
GFR, Estimated: 46 mL/min — ABNORMAL LOW (ref >60)
Glucose, Bld: 81 mg/dL (ref 70–99)
Potassium: 3.8 mmol/L (ref 3.5–5.1)
Sodium: 139 mmol/L (ref 135–145)
Total Bilirubin: 0.7 mg/dL (ref 0.3–1.2)
Total Protein: 6.8 g/dL (ref 6.5–8.1)

## 2022-01-05 LAB — LACTATE DEHYDROGENASE: LDH: 140 U/L (ref 98–192)

## 2022-01-05 NOTE — Progress Notes (Signed)
[name] ?CONSULT NOTE ? ?Patient Care Team: ?Peggye Form, NP as PCP - General (Family Medicine) ?Herbert Pun, MD as Consulting Physician (General Surgery) ?Cammie Sickle, MD as Consulting Physician (Internal Medicine) ?Herbert Pun, MD as Consulting Physician (General Surgery) ? ?CHIEF COMPLAINTS/PURPOSE OF CONSULTATION:  ? ?Oncology History Overview Note  ?# MAY 2021- Bilateral neck adenopathy right more than left; highly concerning for lymphoproliferative disorder.  Excisional biopsy right neck- [Dr.Cintron];  A. 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  ? ?B. 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  ? ?# MAY 2021-CBC/CMP normal /  mildly elevated alkaline phosphatase. [PCP; [facility]]. ? ?# july7th, 2021- Benda-Rituxa ? ?# SURVIVORSHIP:  ? ?# GENETICS:  ? ?DIAGNOSIS: Follicle lymphoma ? ?STAGE: III        ;  GOALS: Control ? ?CURRENT/MOST RECENT THERAPY : Bendamustine Rituxan [C] ? ? ?  ?Follicular lymphoma grade ii, lymph nodes of multiple sites ([place])  ?01/29/2020 Initial Diagnosis  ? Follicular lymphoma grade ii, lymph nodes of multiple sites [facility]) ?  ?03/02/2020 -  Chemotherapy  ? The patient had dexamethasone (DECADRON) 4 MG tablet, 8 mg, Oral, Daily, 1 of 1 cycle, Start date: --, End date: -- ?palonosetron (ALOXI) injection 0.25 mg, 0.25 mg, Intravenous,  Once, 6 of 6 cycles ?Administration: 0.25 mg (03/02/2020), 0.25 mg (04/04/2020), 0.25 mg (05/03/2020), 0.25 mg (07/26/2020), 0.25 mg (06/28/2020), 0.25 mg (08/23/2020) ?pegfilgrastim-cbqv (UDENYCA) injection 6 mg, 6 mg, Subcutaneous, Once, 3 of 3 cycles ?Administration: 6 mg (06/30/2020), 6 mg (07/28/2020), 6 mg (08/25/2020) ?bendamustine (BENDEKA) 175 mg in sodium chloride 0.9 % 50 mL (3.0702 mg/mL) chemo infusion, 90 mg/m2 = 175 mg, Intravenous,  Once, 6 of 6 cycles ?Administration: 175 mg (03/02/2020), 175 mg  (03/03/2020), 175 mg (04/04/2020), 175 mg (04/05/2020), 175 mg (05/03/2020), 175 mg (05/04/2020), 175 mg (07/26/2020), 175 mg (07/27/2020), 175 mg (06/28/2020), 175 mg (06/29/2020), 175 mg (08/23/2020), 175 mg (08/24/2020) ?riTUXimab-pvvr (RUXIENCE) 700 mg in sodium chloride 0.9 % 250 mL (2.1875 mg/mL) infusion, 375 mg/m2 = 700 mg, Intravenous,  Once, 2 of 2 cycles ?Administration: 700 mg (03/02/2020) ? ? for chemotherapy treatment.  ? ?  ? ?HISTORY OF PRESENTING ILLNESS: Alone.  Ambulating dependently. ? ?Danielle Lewis 66 y.o.  female  follicular lymphoma grade 1-2 currently on bendamustine Rituxan is here for follow-up. ? ?Patient did not get her scans done as she had multiple other appointments. ? ?Complains of chronic back pain.  S/p steroid injection.  Continues her chronic fatigue chronic. ? ?Continues to have hot flashes.  Continues to have chronic joint pains.  ? ?Review of Systems  ?Constitutional:  Positive for diaphoresis and malaise/fatigue. Negative for chills, fever and weight loss.  ?HENT:  Negative for nosebleeds and sore throat.   ?Eyes:  Negative for double vision.  ?Respiratory:  Negative for cough, hemoptysis, sputum production, shortness of breath and wheezing.   ?Cardiovascular:  Negative for chest pain, palpitations, orthopnea and leg swelling.  ?Gastrointestinal:  Negative for abdominal pain, blood in stool, constipation, diarrhea, heartburn, melena, nausea and vomiting.  ?Genitourinary:  Negative for dysuria, frequency and urgency.  ?Musculoskeletal:  Positive for back pain and joint pain.  ?Skin: Negative.  Negative for itching and rash.  ?Neurological:  Negative for dizziness, tingling, focal weakness, weakness and headaches.  ?Endo/Heme/Allergies:  Does not bruise/bleed easily.  ?Psychiatric/Behavioral:  Negative for depression. The patient has insomnia. The  patient is not nervous/anxious.    ? ?MEDICAL HISTORY:  ?Past Medical History:  ?Diagnosis Date  ? Anxiety   ? Arthritis   ? Cancer [facility])   ?  COVID-19 2021  ? Depression   ? GERD (gastroesophageal reflux disease)   ? Headache   ? Hypertension   ? Pneumonia   ? ? ?SURGICAL HISTORY: ?Past Surgical History:  ?Procedure Laterality Date  ? ABDOMINAL HYSTERECTOMY    ? APPENDECTOMY    ? BREAST BIOPSY Right 04/28/2021  ? U/s bx 7:00/2cmfn"heart" marker-path pending  ? CHOLECYSTECTOMY    ? 28years ago  ? DILATION AND CURETTAGE OF UTERUS    ? EXCISION OF BREAST BIOPSY Right 05/19/2021  ? Procedure: EXCISION OF BREAST BIOPSY w/ Radio frequency tag;  Surgeon: Herbert Pun, MD;  Location: [facility] ORS;  Service: General;  Laterality: Right;  ? LAPAROSCOPIC BILATERAL SALPINGO OOPHERECTOMY Bilateral 07/06/2021  ? Procedure: LAPAROSCOPIC BILATERAL SALPINGO OOPHORECTOMY WITH CYSTOSCOPY;  Surgeon: Malachy Mood, MD;  Location: [facility] ORS;  Service: Gynecology;  Laterality: Bilateral;  ? LYMPH GLAND EXCISION Right 01/22/2020  ? Procedure: CERVICAL LYMPH GLAND EXCISION;  Surgeon: Herbert Pun, MD;  Location: [facility] ORS;  Service: General;  Laterality: Right;  ? PORTACATH PLACEMENT N/A 02/24/2020  ? Procedure: INSERTION PORT-A-CATH;  Surgeon: Herbert Pun, MD;  Location: [facility] ORS;  Service: General;  Laterality: N/A;  ? TONSILLECTOMY    ? ? ?SOCIAL HISTORY: ?Social History  ? ?Socioeconomic History  ? Marital status: Married  ?  Spouse name: Not on file  ? Number of children: Not on file  ? Years of education: Not on file  ? Highest education level: Not on file  ?Occupational History  ? Not on file  ?Tobacco Use  ? Smoking status: Never  ? Smokeless tobacco: Never  ?Vaping Use  ? Vaping Use: Never used  ?Substance and Sexual Activity  ? Alcohol use: Not Currently  ?  Alcohol/week: 1.0 standard drink  ?  Types: 1 Glasses of wine per week  ? Drug use: Not Currently  ? Sexual activity: Yes  ?  Birth control/protection: Surgical  ?  Comment: Tubal  ?Other Topics Concern  ? Not on file  ?Social History Narrative  ? Lives in [place]; [place]. Never smoked; wine  rarely. Used in work in [employer]. With husband.   ? ?Social Determinants of Health  ? ?Financial Resource Strain: Not on file  ?Food Insecurity: Not on file  ?Transportation Needs: Not on file  ?Physical Activity: Not on file  ?Stress: Not on file  ?Social Connections: Not on file  ?Intimate Partner Violence: Not on file  ? ? ?FAMILY HISTORY: ?Family History  ?Problem Relation Age of Onset  ? Breast cancer Mother   ? Cancer Mother   ?     unknown type  ? ? ?ALLERGIES:  is allergic to penicillins. ? ?MEDICATIONS:  ?Current Outpatient Medications  ?Medication Sig Dispense Refill  ? ALPRAZolam (XANAX) 0.5 MG tablet Take 0.5 mg by mouth in the morning and at bedtime.    ? amLODipine (NORVASC) 5 MG tablet Take 1 tablet by mouth once daily 30 tablet 0  ? cetirizine (ZYRTEC) 10 MG tablet Take 10 mg by mouth daily as needed for allergies.    ? escitalopram (LEXAPRO) 20 MG tablet Take 20 mg by mouth at bedtime.    ? naproxen (NAPROSYN) 500 MG tablet Take 500 mg by mouth 2 (two) times daily.    ? omeprazole (PRILOSEC) 20 MG capsule Take 20  mg by mouth daily.    ? zolpidem (AMBIEN CR) 6.25 MG CR tablet Take 1 tablet (6.25 mg total) by mouth at bedtime as needed. for sleep 30 tablet 0  ? ?No current facility-administered medications for this visit.  ? ? ?  ?. ? ?PHYSICAL EXAMINATION: ?ECOG PERFORMANCE STATUS: 0 - Asymptomatic ? ?Vitals:  ? 01/05/22 1430  ?BP: 138/79  ?Pulse: 83  ?Temp: 97.9 ?F (36.6 ?C)  ?SpO2: 99%  ? ? ?Filed Weights  ? 01/05/22 1430  ?Weight: 195 lb 6.4 oz (88.6 kg)  ? ? ? ?Physical Exam ?Constitutional:   ?   Comments: Walk independently.  Accompanied by family.  ?HENT:  ?   Head: Normocephalic and atraumatic.  ?   Mouth/Throat:  ?   Pharynx: No oropharyngeal exudate.  ?Eyes:  ?   Pupils: Pupils are equal, round, and reactive to light.  ?Neck:  ?   Comments: Neck lymphadenopathy improved. ?Cardiovascular:  ?   Rate and Rhythm: Normal rate and regular rhythm.  ?Pulmonary:  ?   Effort: Pulmonary effort  is normal. No respiratory distress.  ?   Breath sounds: Normal breath sounds. No wheezing.  ?Abdominal:  ?   General: Bowel sounds are normal. There is no distension.  ?   Palpations: Abdomen is soft. There is no m

## 2022-01-05 NOTE — Assessment & Plan Note (Addendum)
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  July 2022-CT scan neck chest abdomen pelvis-negative for any progressive adenopathy/or any concerns for new lymphadenopathy or lesions. ? ?#Clinically evidence of recurrence.  Recommend proceeding with scans N-CAP 1 week/please schedule.  ? ?# Creatinine- 1.3/GFR-42- ? Etiology; await CT scans.  No clinical concerns for any ? ?# Chronic back pain- on Tramadol  Prn/PCP.  ? ?# port/IV access- explanted.  ? ?# DISPOSITION:  ?# please re-schedule the CT scan in next 1 week or so.  ? ?# follow up in 3 months;MD; labs- cbc/cmp/LDH- Dr.B ?  ?Cc; Dr.Linthavong/Ms. fields NP.  ? ?

## 2022-01-05 NOTE — Progress Notes (Signed)
On something for her back pain but can't remember the name. ?

## 2022-01-12 ENCOUNTER — Ambulatory Visit
Admission: RE | Admit: 2022-01-12 | Discharge: 2022-01-12 | Disposition: A | Discharge status: Home / Self Care | Payer: Medicare PPO | Source: Ambulatory Visit | Attending: Internal Medicine | Admitting: Internal Medicine

## 2022-01-12 DIAGNOSIS — C8218 Follicular lymphoma grade II, lymph nodes of multiple sites: Secondary | ICD-10-CM | POA: Diagnosis present

## 2022-01-12 IMAGING — CT CT CHEST-ABD-PELV W/ CM
3 of 5 series · 14 of 36 positions shown, 16 images · IV contrast (agent unspecified)
Comparison: Multiple priors including most recent CT [DATE]

CLINICAL DATA: History of follicular lymphoma, surveillance.

* Tracking Code: BO *
EXAM:
CT CHEST, ABDOMEN, AND PELVIS WITH CONTRAST
TECHNIQUE: Multidetector CT imaging of the chest, abdomen and pelvis was
performed following the standard protocol during bolus
administration of intravenous contrast.

[Series 3: cap with 5.00 ax · axial · 0.85mm/px · z∈[-1231,-716]mm · 9 of 129 slices shown, 11 images]
[im 13/129  mediastinal]
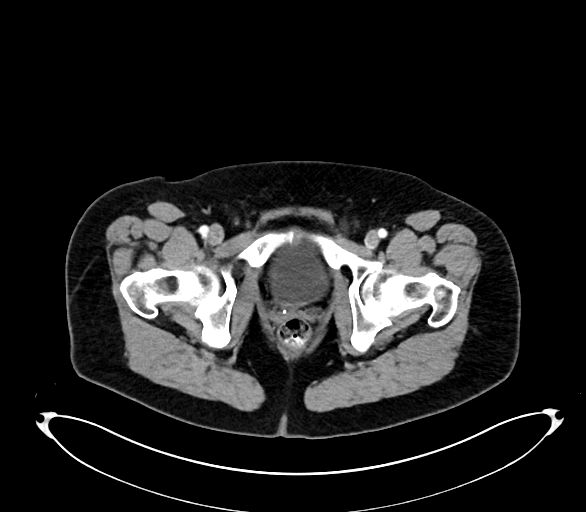
[im 13/129  bone]
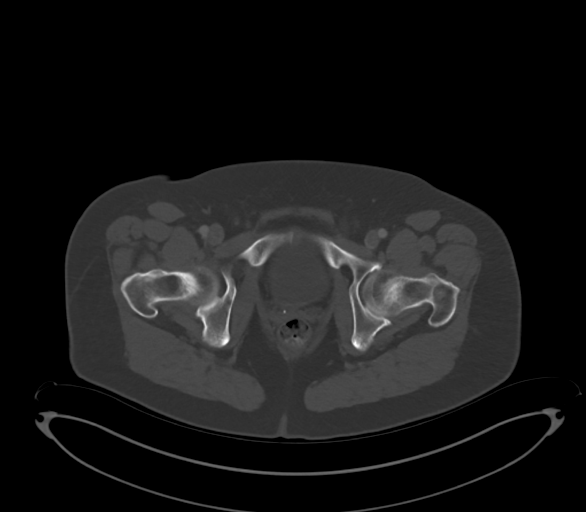
[im 26/129  mediastinal]
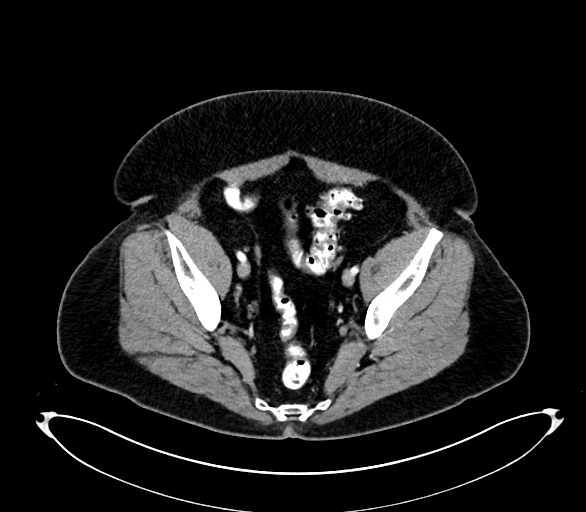
[im 39/129  mediastinal]
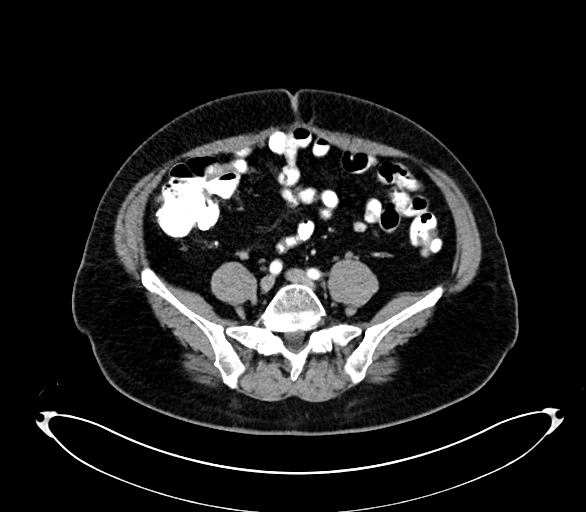
[im 52/129  mediastinal]
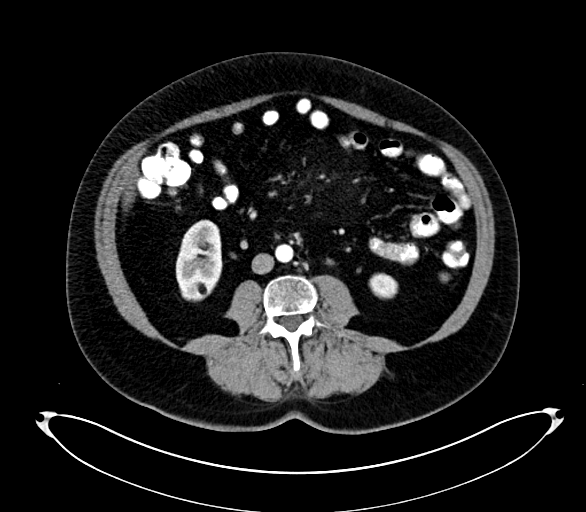
[im 65/129  mediastinal]
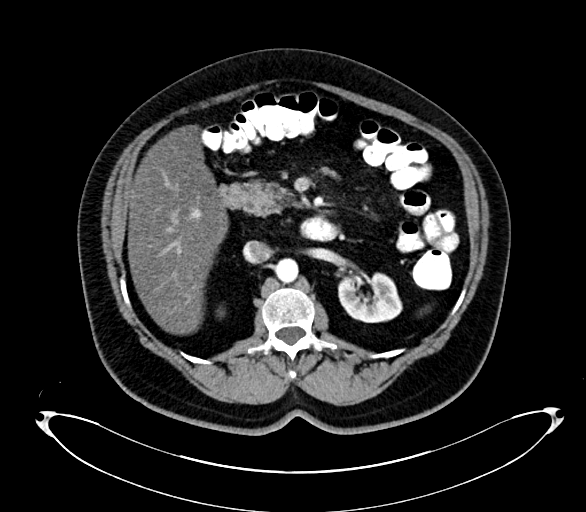
[im 77/129  mediastinal]
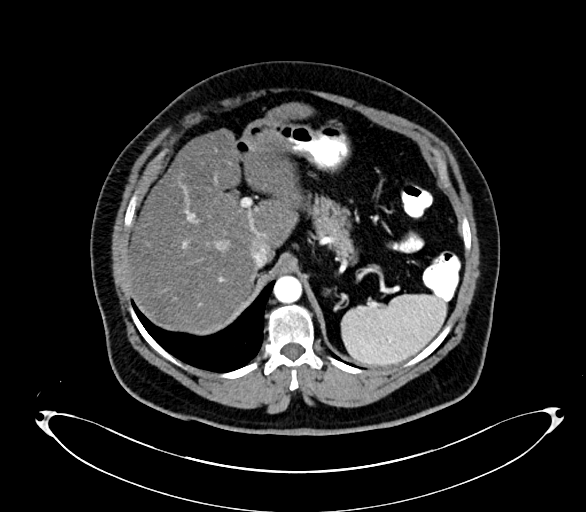
[im 90/129  mediastinal]
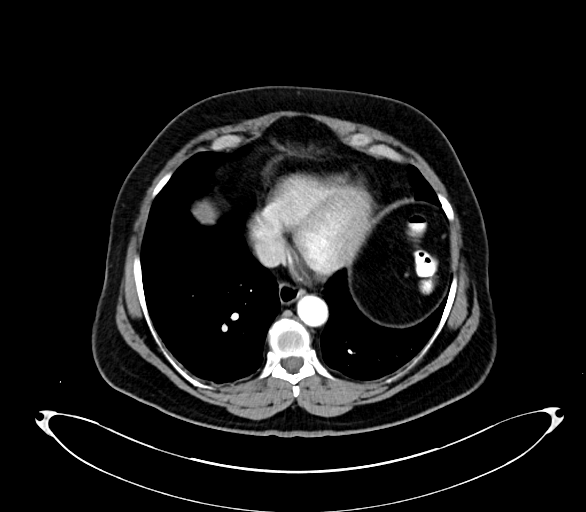
[im 103/129  mediastinal]
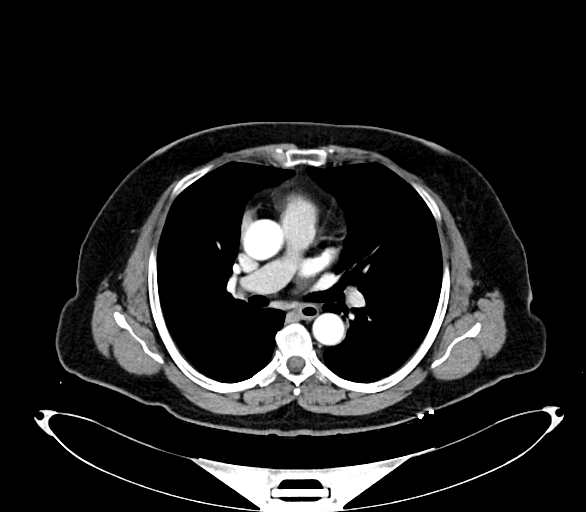
[im 116/129  mediastinal]
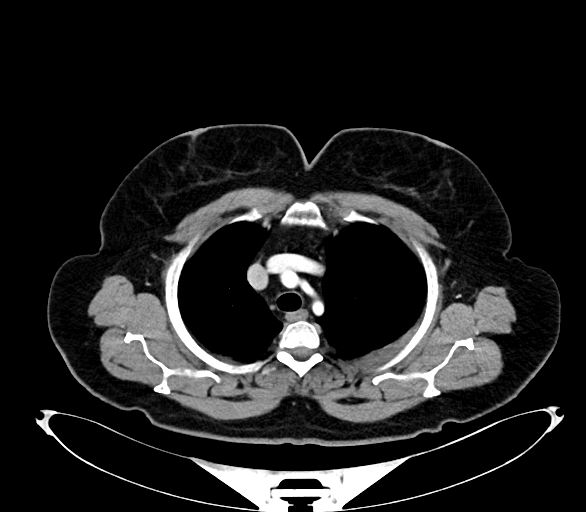
[im 116/129  bone]
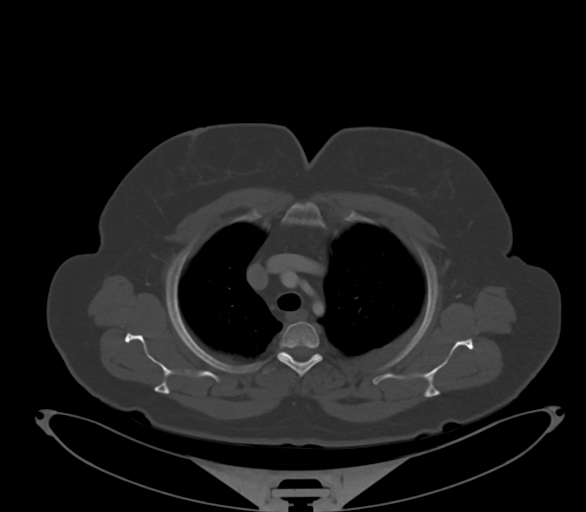

[Series 5: lungs cap with 2.00 ax · axial · 0.85mm/px · z∈[-944,-896]mm · 2 of 160 slices shown]
[im 13/160  mediastinal]
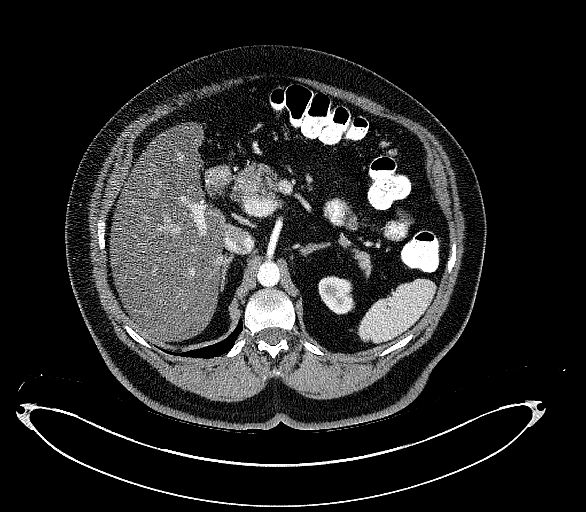
[im 37/160  mediastinal]
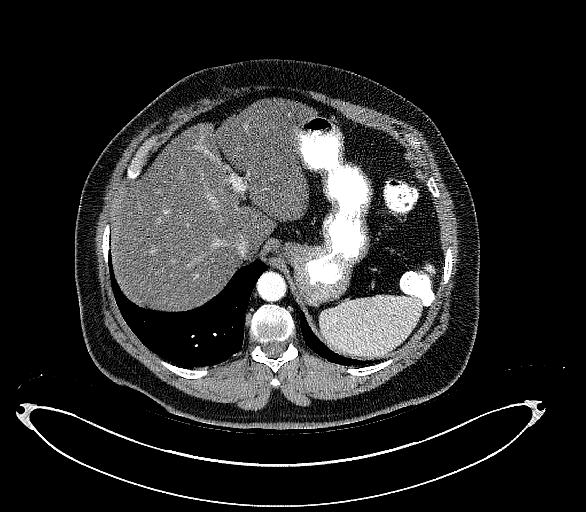

[Series 7: coronal cap with 2.00 cor · coronal · 0.97mm/px · 3 of 207 slices shown]
[im 42/207  mediastinal]
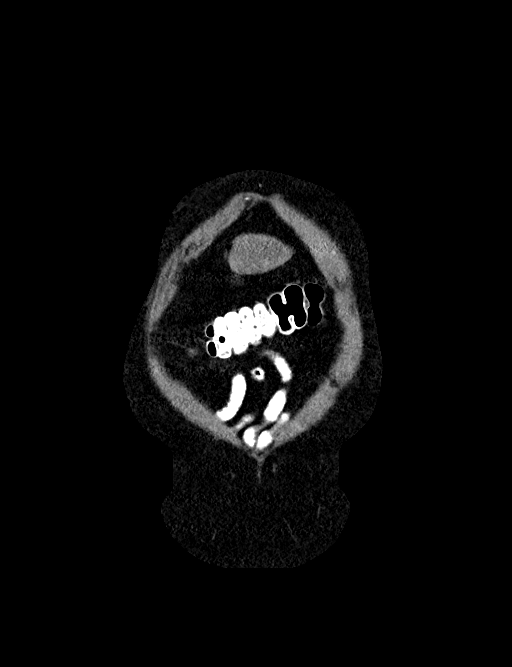
[im 83/207  mediastinal]
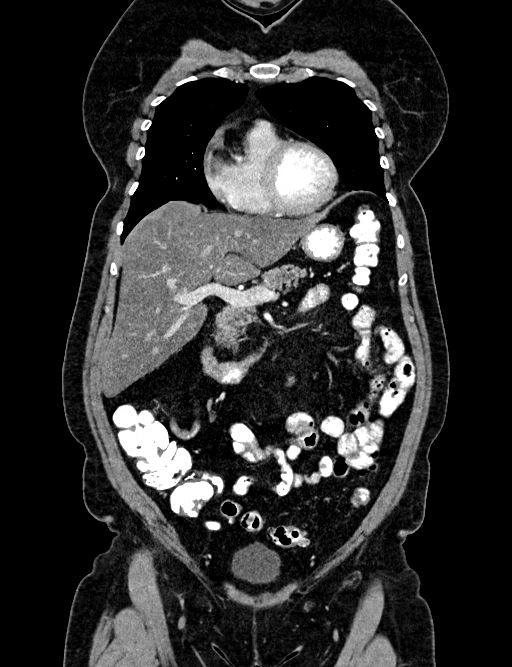
[im 124/207  mediastinal]
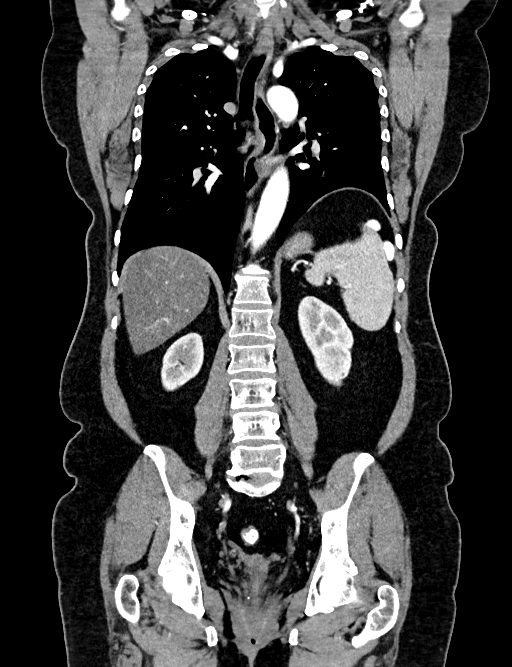

[14 of 36 positions shown; findings below may reference images not displayed]

RADIATION DOSE REDUCTION: This exam was performed according to the
departmental dose-optimization program which includes automated
exposure control, adjustment of the mA and/or kV according to
patient size and/or use of iterative reconstruction technique.

CONTRAST:  100mL OMNIPAQUE IOHEXOL 300 MG/ML  SOLN
FINDINGS: CT CHEST FINDINGS

Cardiovascular: Interval removal of the left-sided Port-A-Cath.
Normal caliber aorta. No central pulmonary embolus on this
nondedicated study. Normal size heart. No significant pericardial
effusion/thickening.

Mediastinum/Nodes: Discrete thyroid nodule. No pathologically
enlarged mediastinal, hilar or axillary lymph nodes. The esophagus
is grossly unremarkable.

Lungs/Pleura: No suspicious pulmonary nodules or masses. No pleural
effusion. No pneumothorax.

Musculoskeletal: Suspicious chest wall mass. No aggressive lytic or
blastic lesion of bone.

CT ABDOMEN PELVIS FINDINGS

Hepatobiliary: Hepatomegaly with the liver measuring 19.5 cm in
maximum craniocaudal dimension at the midclavicular line. Hepatic
steatosis. Gallbladder is surgically absent. No biliary ductal
dilation.

Pancreas: No pancreatic ductal dilation or evidence of acute
inflammation.

Spleen: No splenomegaly or suspicious splenic lesion.

Adrenals/Urinary Tract: Bilateral adrenal glands appear normal. No
hydronephrosis. Benign 9 mm right angiomyolipoma. No suspicious
renal mass. Urinary bladder is unremarkable for degree of
distension.

Stomach/Bowel: Radiopaque enteric contrast material traverses the
rectum. Stomach is unremarkable for degree of distension. No
pathologic dilation of small or large bowel. Terminal ileum appears
normal. Appendix is not confidently identified. No evidence of acute
bowel inflammation.

Vascular/Lymphatic: Normal caliber abdominal aorta.

Similar haziness of the small bowel mesentery with prominent
mesenteric lymph nodes measuring up to 5 mm in short axis,
unchanged.

Small left common iliac lymph node measures 6 mm in short axis on
image 87/3, unchanged.

Reproductive: Status post hysterectomy. No adnexal masses.

Other: No significant abdominopelvic free fluid.

Musculoskeletal: No acute or significant osseous findings.
IMPRESSION: 1. Stable examination without new or progressive adenopathy in the
chest, abdomen or pelvis.
2. Hepatomegaly and hepatic steatosis.

## 2022-01-12 IMAGING — CT CT NECK W/ CM
5 series · 16 of 33 positions shown, 18 images · IV contrast (agent unspecified)
Comparison: [DATE]

CLINICAL DATA: Head/neck cancer, monitor hx of lymphoma of neck

EXAM:
CT NECK WITH CONTRAST
TECHNIQUE: Multidetector CT imaging of the neck was performed using the
standard protocol following the bolus administration of intravenous
contrast.

[Series 11: axials neck 2.00 · axial · 0.45mm/px · z∈[-697,-615]mm · 2 of 124 slices shown]
[im 42/124  bone]
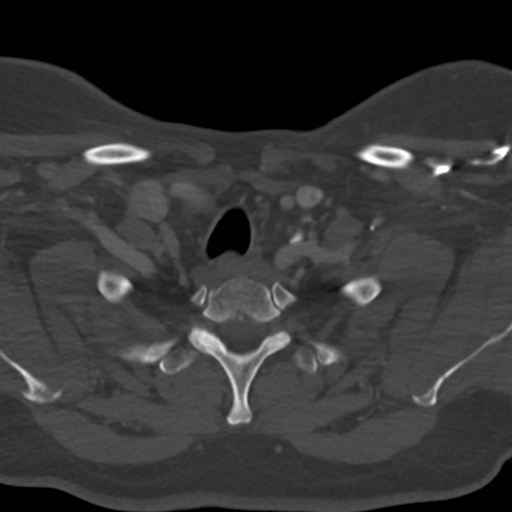
[im 83/124  bone]
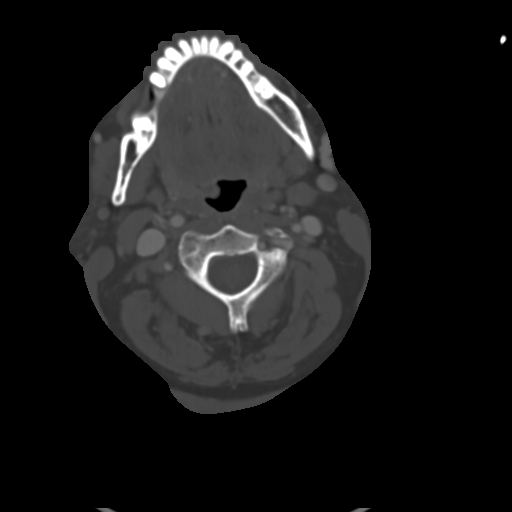

[Series 12: bone windows neck 2.00 · axial · 0.45mm/px · z∈[-719,-595]mm · 3 of 124 slices shown, 4 images]
[im 31/124  soft-tissue]
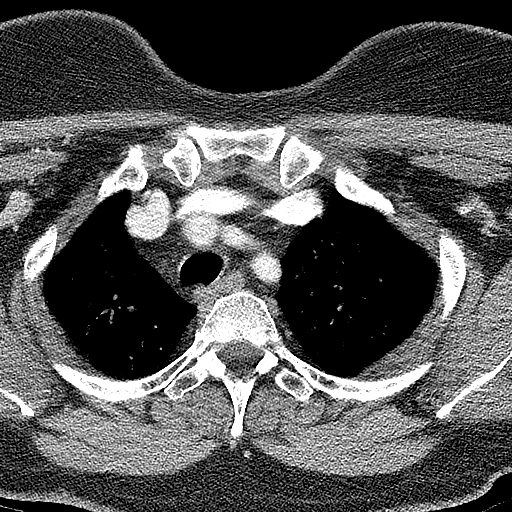
[im 31/124  bone]
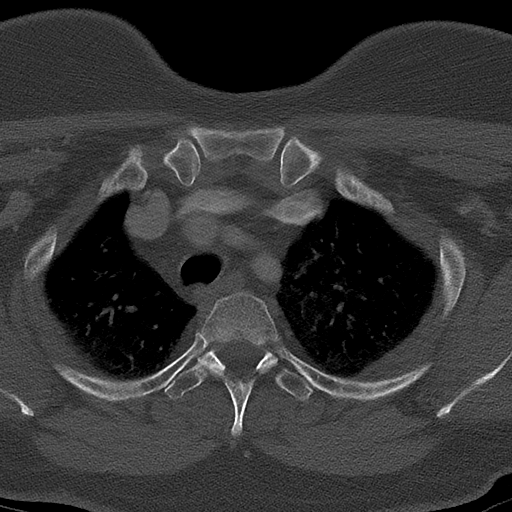
[im 62/124  bone]
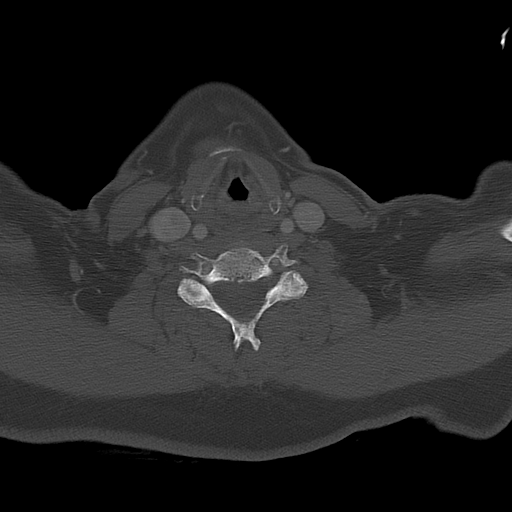
[im 93/124  bone]
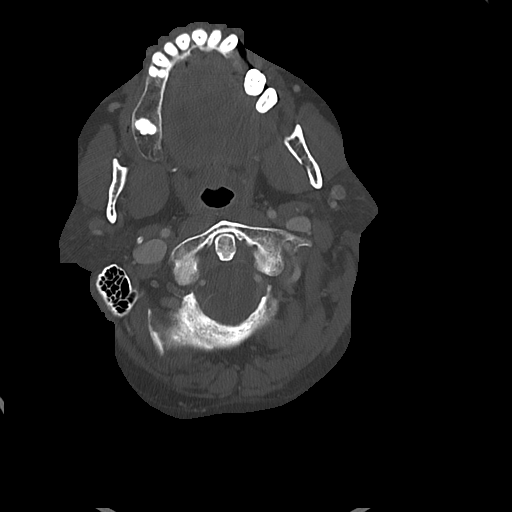

[Series 13: coronals neck 2.00 cor · coronal · 0.45mm/px · 3 of 114 slices shown]
[im 23/114  bone]
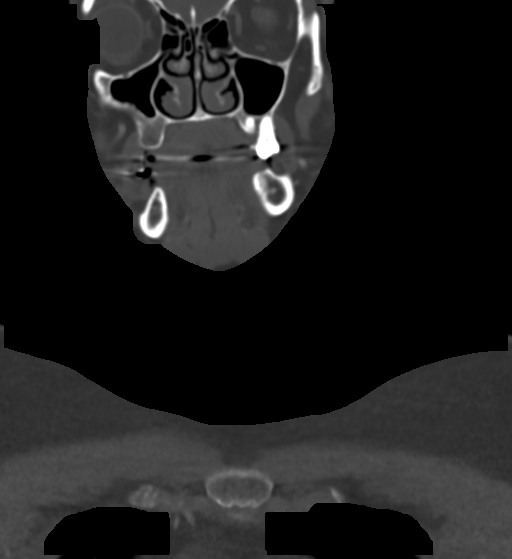
[im 46/114  bone]
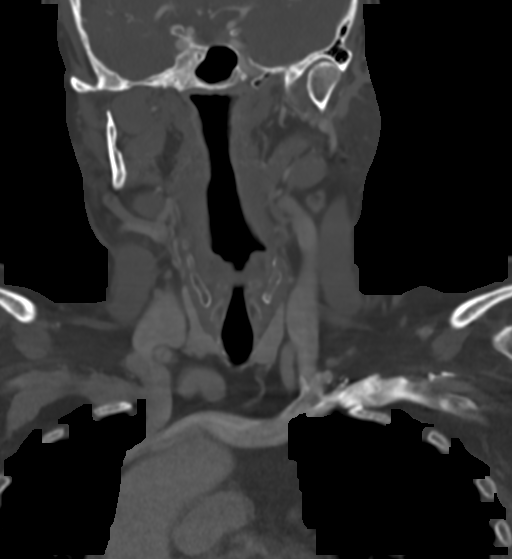
[im 68/114  bone]
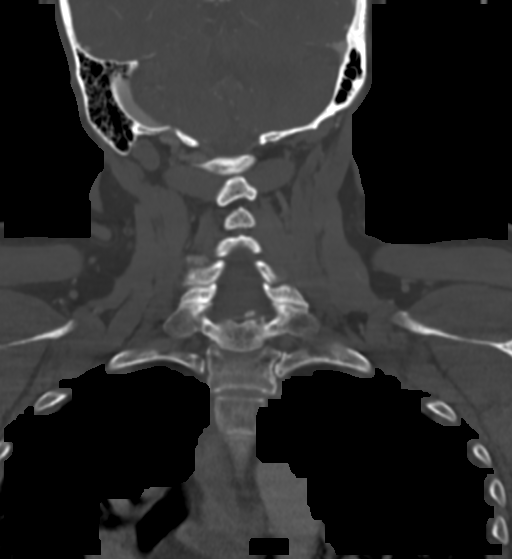

[Series 15: sagittals neck 2.00 sag · sagittal · 0.45mm/px · 5 of 114 slices shown, 6 images]
[im 38/114  bone]
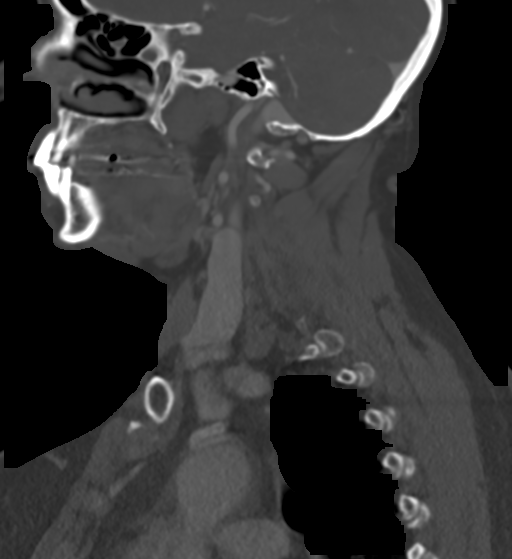
[im 48/114  bone]
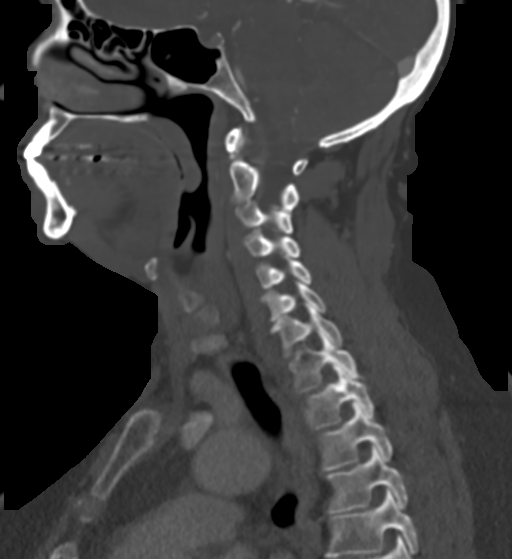
[im 57/114  soft-tissue]
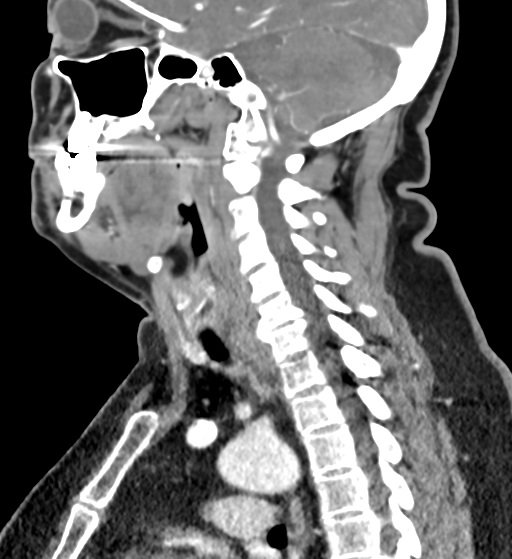
[im 57/114  bone]
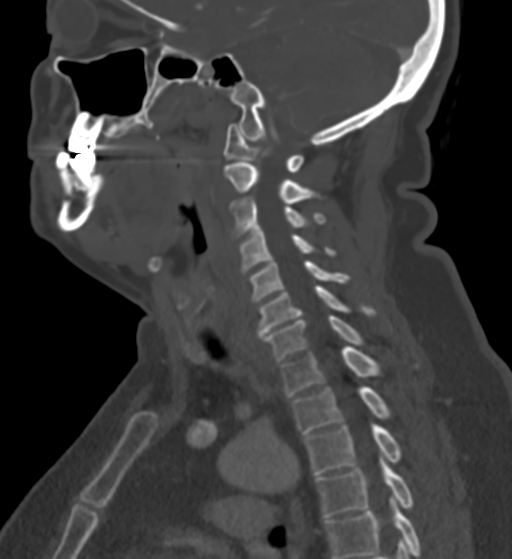
[im 66/114  bone]
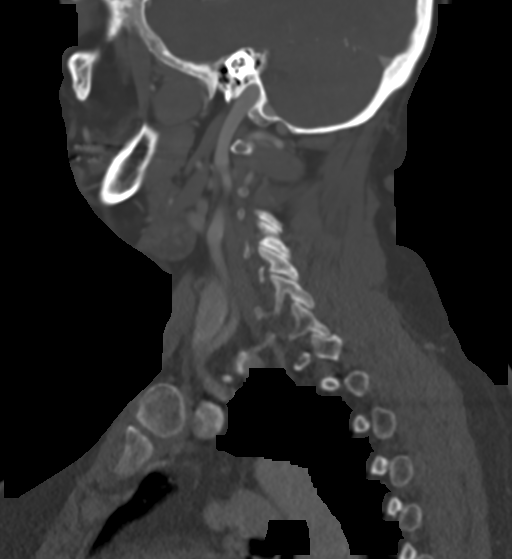
[im 76/114  bone]
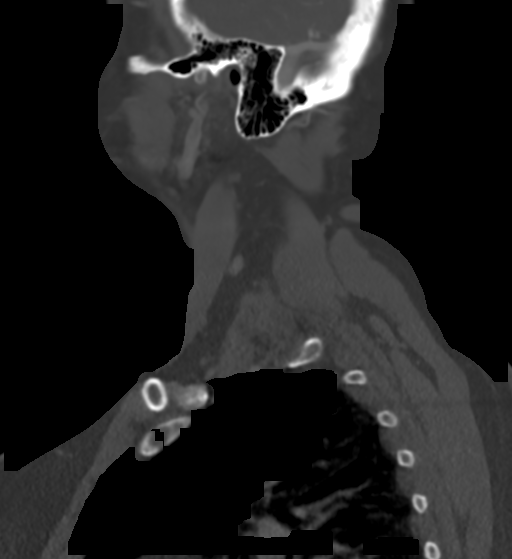

[Series 17: ax oropharynx neck 2.00 ax · axial · 0.45mm/px · z∈[-719,-595]mm · 3 of 124 slices shown]
[im 31/124  bone]
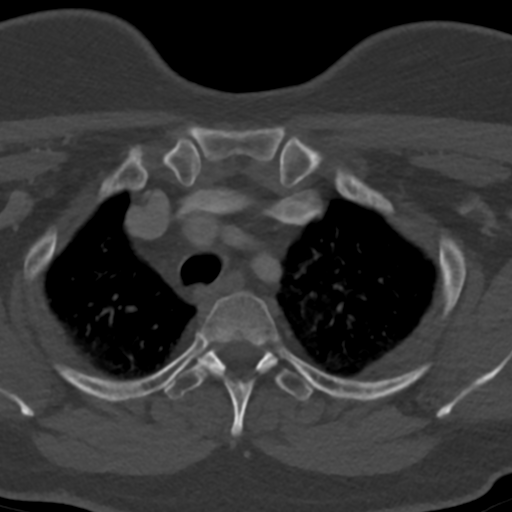
[im 62/124  bone]
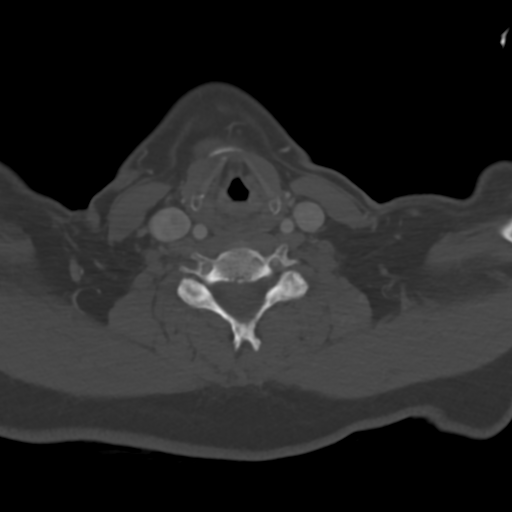
[im 93/124  bone]
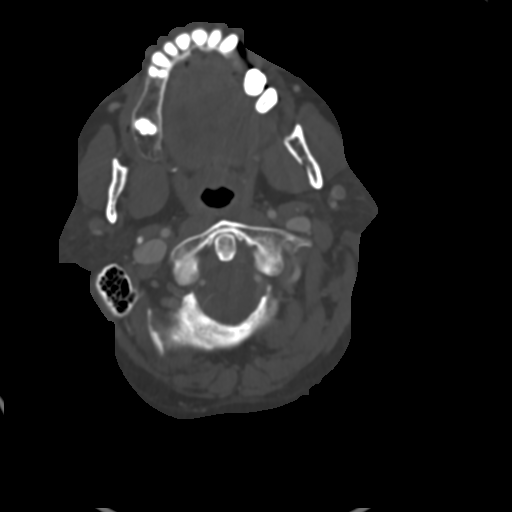

[16 of 33 positions shown; findings below may reference images not displayed]

RADIATION DOSE REDUCTION: This exam was performed according to the
departmental dose-optimization program which includes automated
exposure control, adjustment of the mA and/or kV according to
patient size and/or use of iterative reconstruction technique.

CONTRAST:  100mL OMNIPAQUE IOHEXOL 300 MG/ML  SOLN
FINDINGS: Pharynx and larynx: Unremarkable.  No mass or swelling.

Salivary glands: Parotid and submandibular glands are unremarkable.

Thyroid: Normal.

Lymph nodes: No enlarged nodes.

Vascular: Major neck vessels are patent.

Limited intracranial: No abnormal enhancement.

Visualized orbits: Unremarkable.

Mastoids and visualized paranasal sinuses: Chronic right sphenoid
sinusitis. Mastoid air cells are clear.

Skeleton: Cervical spine degenerative changes are similar in
appearance.

Upper chest: Dictated separately.
IMPRESSION: No recurrent adenopathy.

## 2022-01-12 MED ORDER — IOHEXOL 300 MG/ML  SOLN
100.0000 mL | Freq: Once | INTRAMUSCULAR | Status: AC | PRN
  Administered 2022-01-12: 100 mL via INTRAVENOUS

## 2022-01-24 ENCOUNTER — Ambulatory Visit
Admission: RE | Admit: 2022-01-24 | Discharge: 2022-01-24 | Disposition: A | Discharge status: Home / Self Care | Payer: Medicare PPO | Source: Ambulatory Visit | Attending: Family Medicine | Admitting: Family Medicine

## 2022-01-24 DIAGNOSIS — N6002 Solitary cyst of left breast: Secondary | ICD-10-CM | POA: Diagnosis present

## 2022-01-24 IMAGING — US US BREAST*L* LIMITED INC AXILLA
1 series · 2 of 2 positions shown · non-contrast
Comparison: Previous exam(s).

CLINICAL DATA: Here for follow-up of a probably benign mass in the
left breast.

EXAM:
DIGITAL DIAGNOSTIC UNILATERAL LEFT MAMMOGRAM WITH TOMOSYNTHESIS AND
CAD; ULTRASOUND LEFT BREAST LIMITED
TECHNIQUE: Left digital diagnostic mammography and breast tomosynthesis was
performed. The images were evaluated with computer-aided detection.;
Targeted ultrasound examination of the left breast was performed.

[Series 1: us breast*left* limited inc axilla · 0.06mm/px · 2 of 2 slices shown]
[im 1/2]
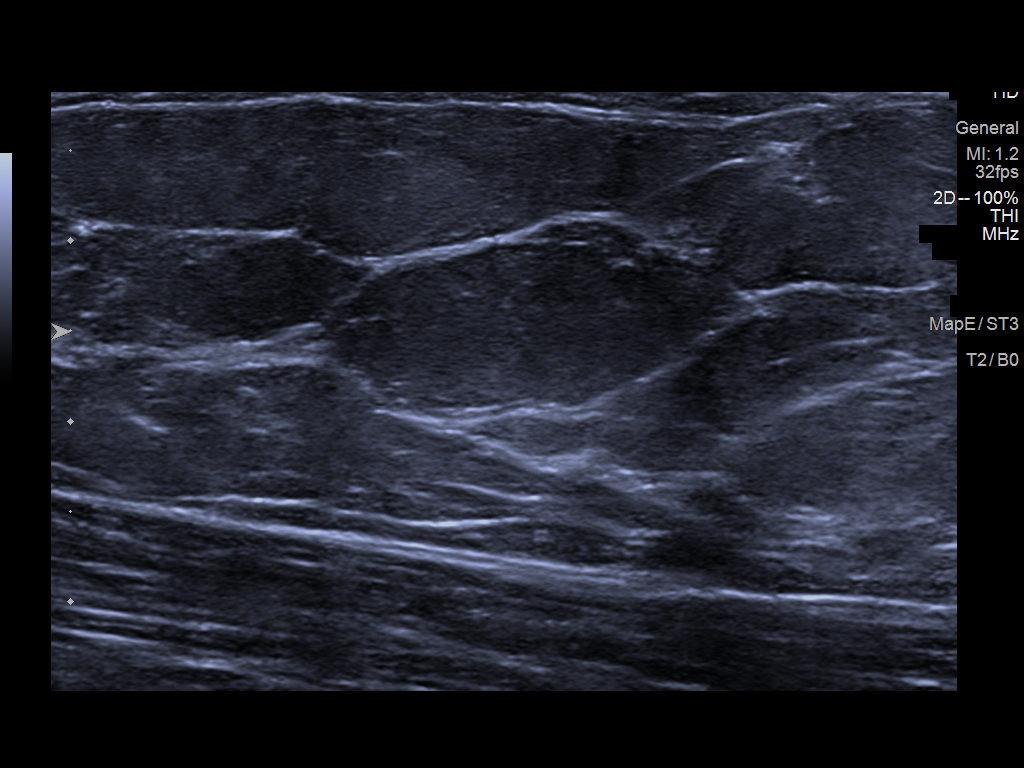
[im 2/2]
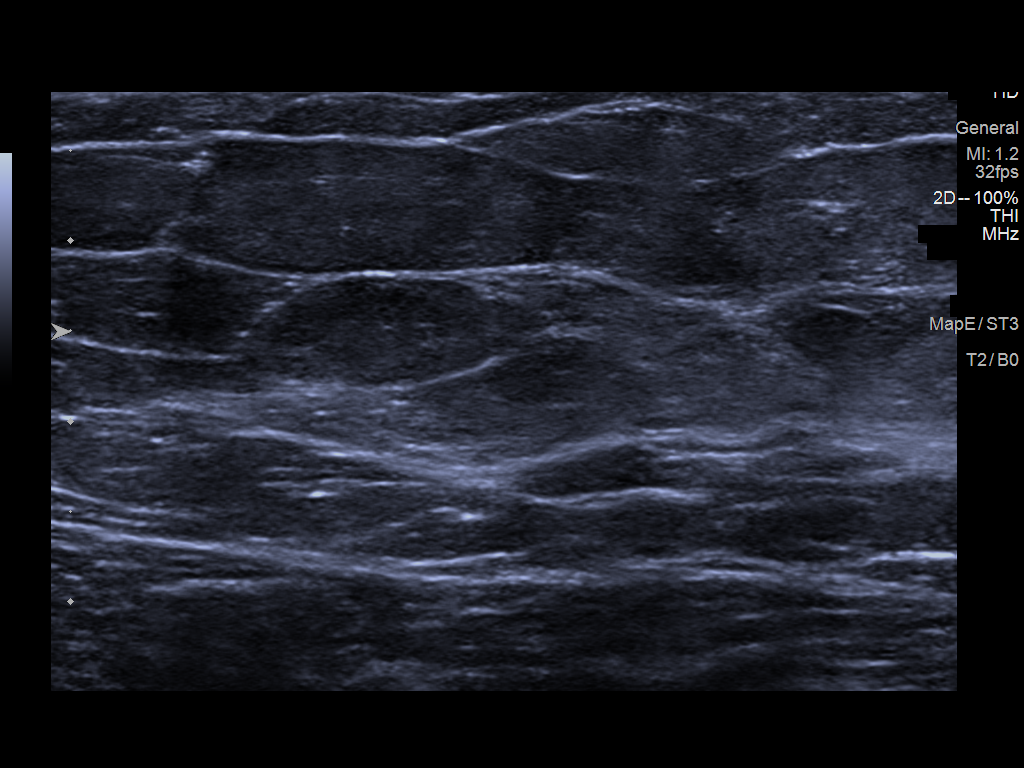

[2 of 2 positions shown; findings below may reference images not displayed]

ACR Breast Density Category b: There are scattered areas of
fibroglandular density.
FINDINGS: Additional views demonstrate interval resolution of the previously
seen left breast mass. No suspicious mass, microcalcification, or
other finding is identified.

Targeted left breast ultrasound was performed from the 9 o'clock to
the 11 o'clock position.

The previously seen mass at 10 o'clock 6 cm from the nipple
demonstrated on exam dated [DATE] is no longer identified. No
suspicious solid or cystic mass is identified.
IMPRESSION: Previously seen mass in the left breast is no longer identified and
likely represented a benign cluster of cysts which has since
resolved. No evidence of malignancy.

RECOMMENDATION:
Recommend routine annual screening mammogram in [DATE].

I have discussed the findings and recommendations with the patient.
If applicable, a reminder letter will be sent to the patient
regarding the next appointment.

BI-RADS CATEGORY  2: Benign.

## 2022-01-24 IMAGING — MG MM DIGITAL DIAGNOSTIC UNILAT*L* W/ TOMO W/ CAD
4 series · 4 of 12 positions shown · non-contrast
Comparison: Previous exam(s).

CLINICAL DATA: Here for follow-up of a probably benign mass in the
left breast.

EXAM:
DIGITAL DIAGNOSTIC UNILATERAL LEFT MAMMOGRAM WITH TOMOSYNTHESIS AND
CAD; ULTRASOUND LEFT BREAST LIMITED
TECHNIQUE: Left digital diagnostic mammography and breast tomosynthesis was
performed. The images were evaluated with computer-aided detection.;
Targeted ultrasound examination of the left breast was performed.

[L MLO synth-2D]
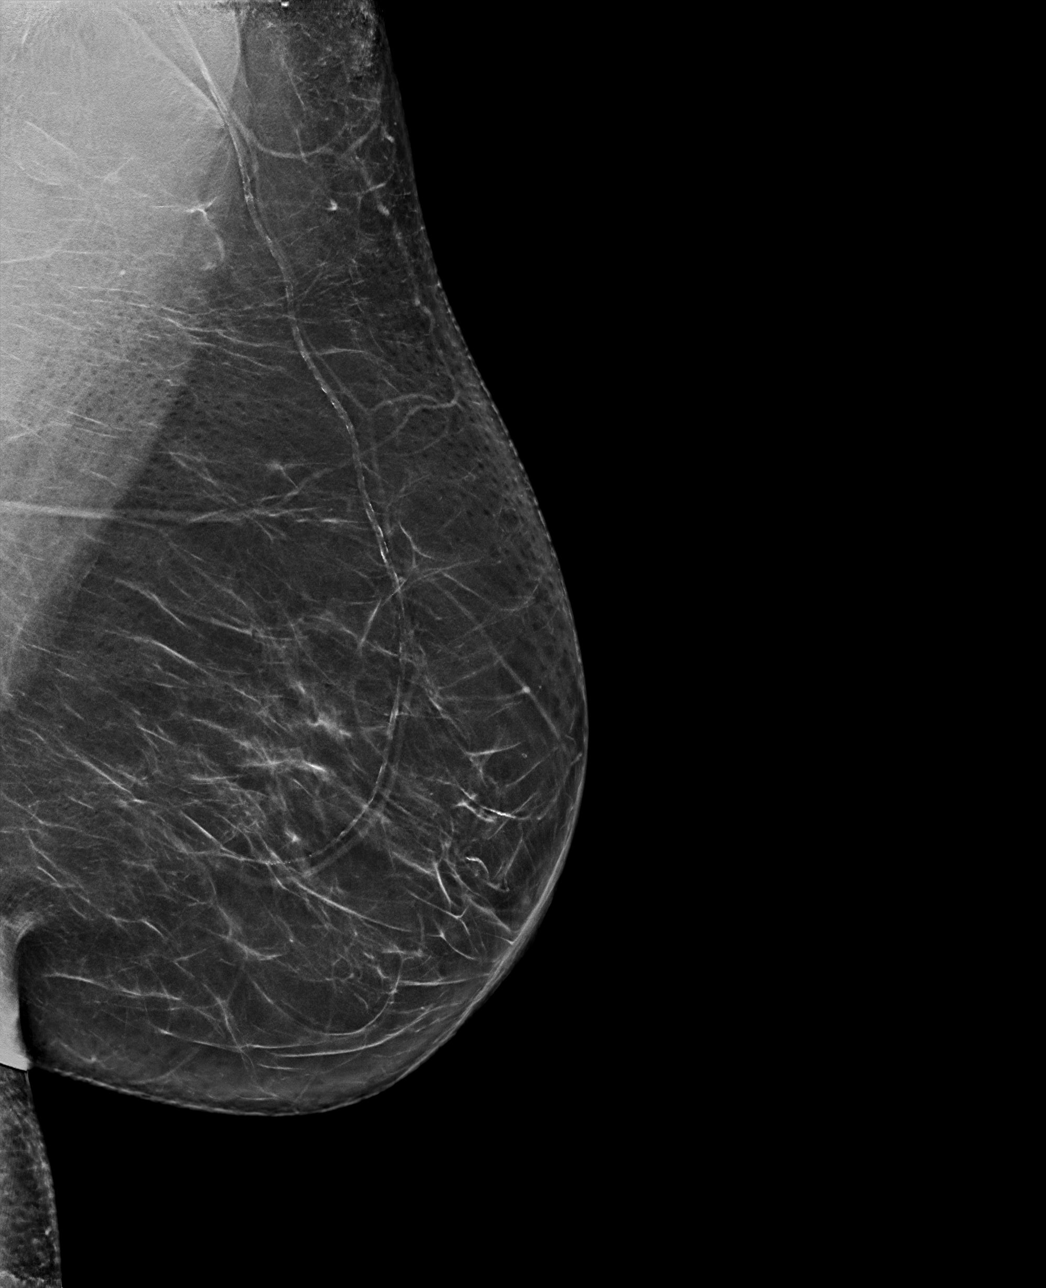

[L CC synth-2D]
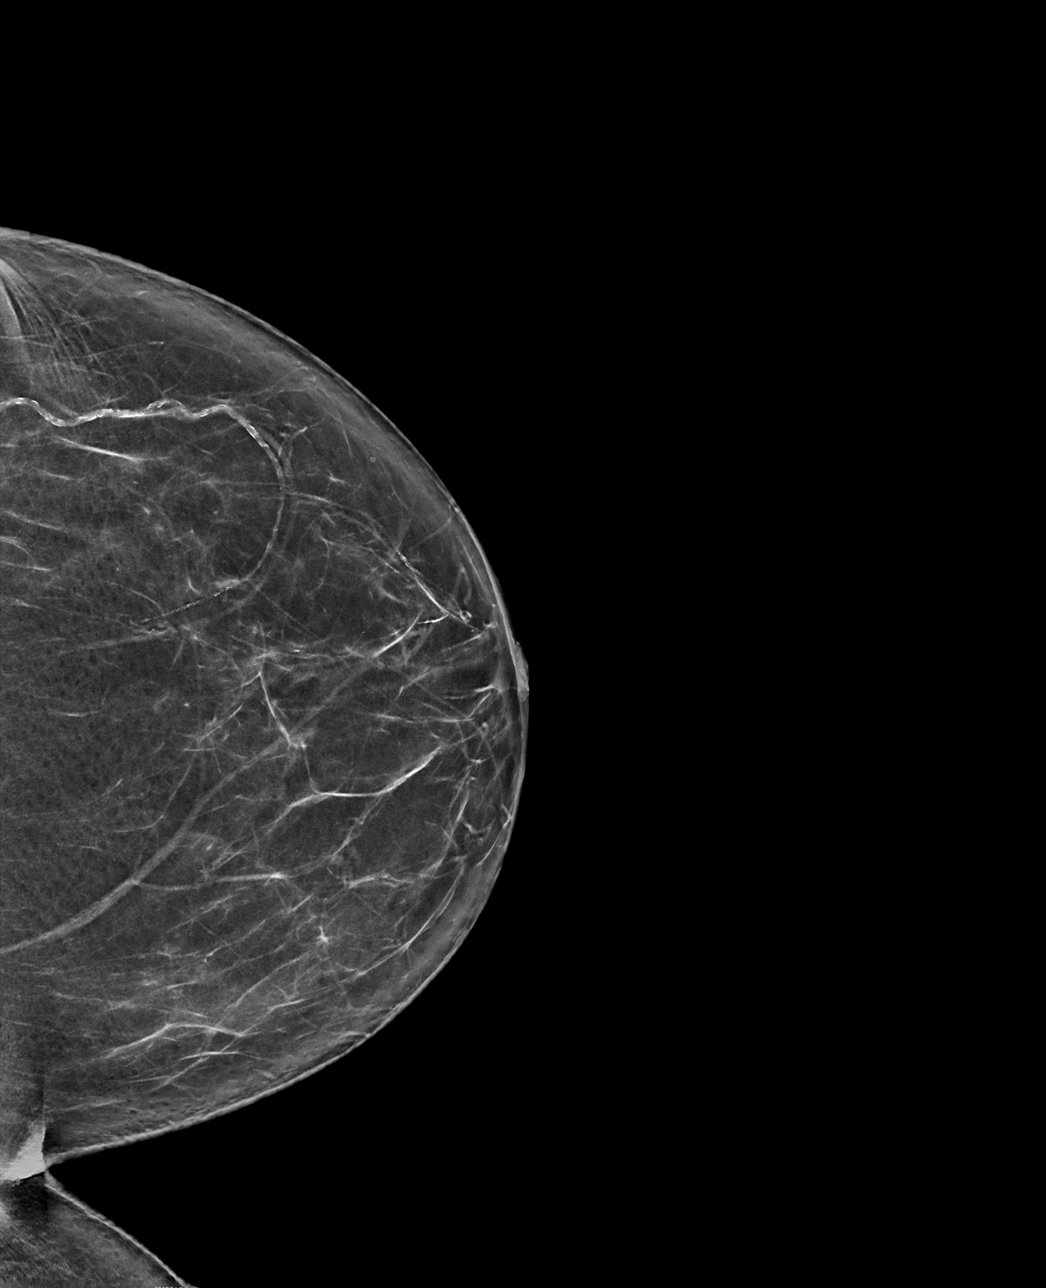

[L MLO tomo · tomo slice 39/78.0]
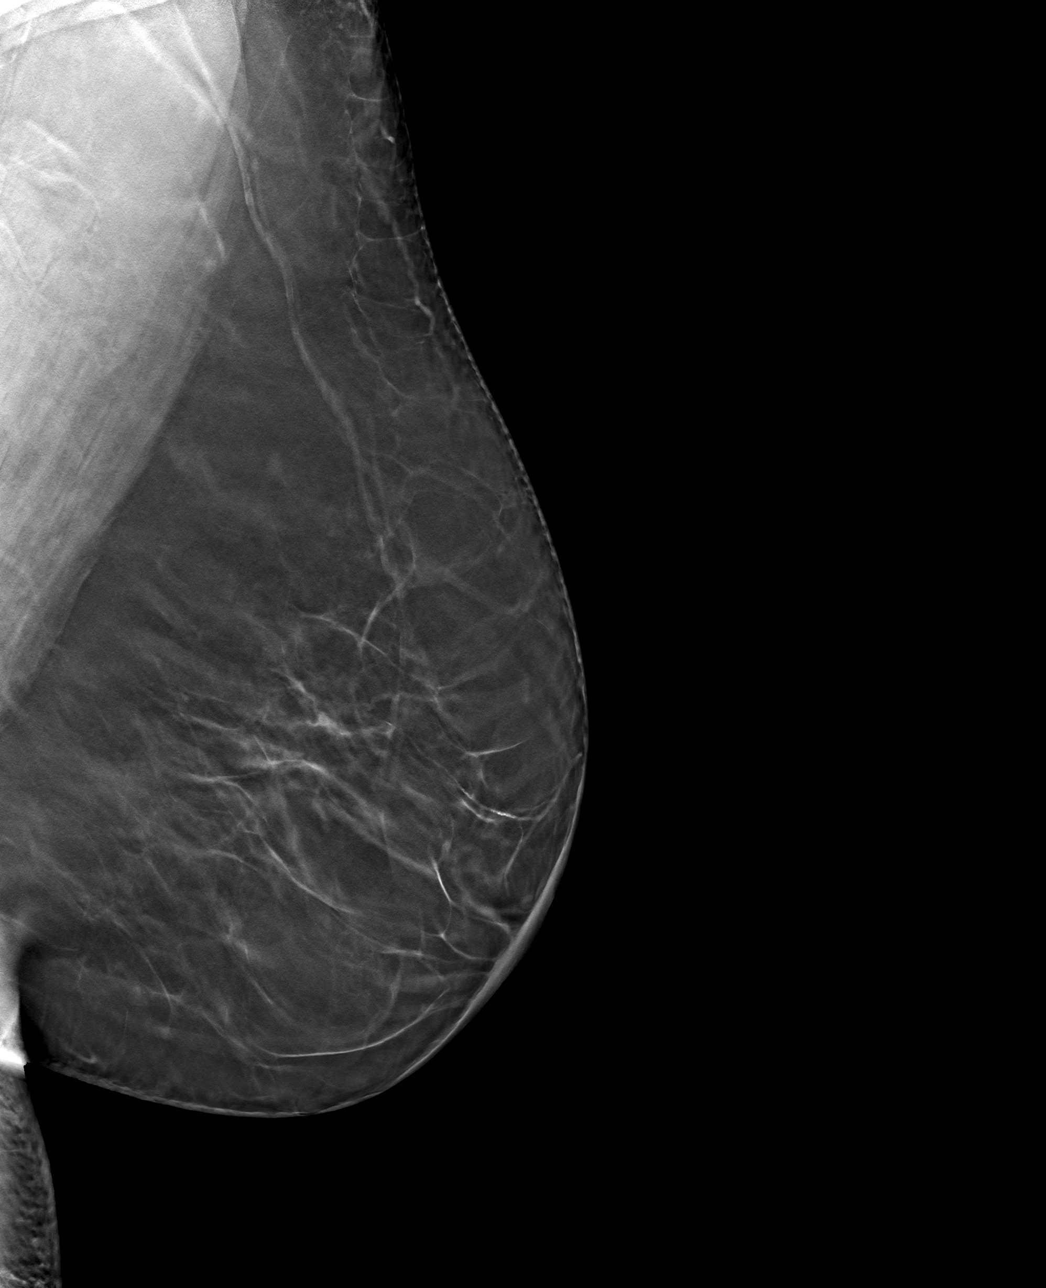

[L CC tomo · tomo slice 36/71.0]
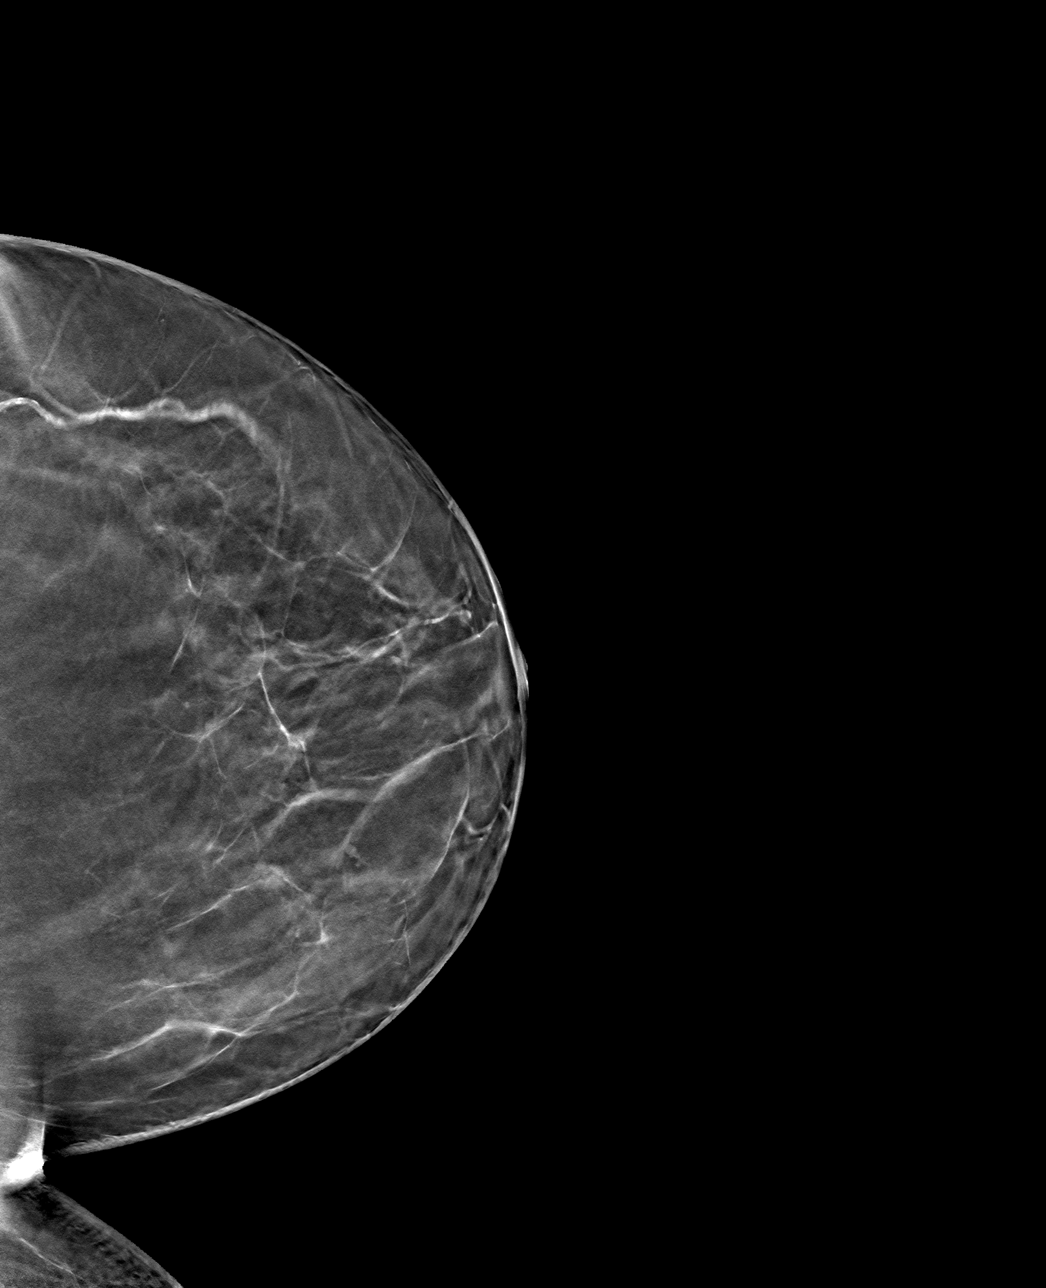

[4 of 12 positions shown; findings below may reference images not displayed]

ACR Breast Density Category b: There are scattered areas of
fibroglandular density.
FINDINGS: Additional views demonstrate interval resolution of the previously
seen left breast mass. No suspicious mass, microcalcification, or
other finding is identified.

Targeted left breast ultrasound was performed from the 9 o'clock to
the 11 o'clock position.

The previously seen mass at 10 o'clock 6 cm from the nipple
demonstrated on exam dated [DATE] is no longer identified. No
suspicious solid or cystic mass is identified.
IMPRESSION: Previously seen mass in the left breast is no longer identified and
likely represented a benign cluster of cysts which has since
resolved. No evidence of malignancy.

RECOMMENDATION:
Recommend routine annual screening mammogram in [DATE].

I have discussed the findings and recommendations with the patient.
If applicable, a reminder letter will be sent to the patient
regarding the next appointment.

BI-RADS CATEGORY  2: Benign.

## 2022-01-31 ENCOUNTER — Other Ambulatory Visit: Payer: Self-pay | Admitting: Family Medicine

## 2022-01-31 DIAGNOSIS — G43809 Other migraine, not intractable, without status migrainosus: Secondary | ICD-10-CM

## 2022-02-08 ENCOUNTER — Ambulatory Visit
Admission: RE | Admit: 2022-02-08 | Discharge: 2022-02-08 | Disposition: A | Discharge status: Home / Self Care | Payer: Medicare PPO | Source: Ambulatory Visit | Attending: Family Medicine | Admitting: Family Medicine

## 2022-02-08 DIAGNOSIS — G43809 Other migraine, not intractable, without status migrainosus: Secondary | ICD-10-CM

## 2022-02-08 IMAGING — MR MR HEAD WO/W CM
13 series · 48 of 48 positions shown · IV contrast (multihance)
Comparison: No prior MRI, correlation is made with CT head
[DATE]

CLINICAL DATA: Headaches with blurred vision, confusion, weakness

EXAM:
MRI HEAD WITHOUT AND WITH CONTRAST
TECHNIQUE: Multiplanar, multiecho pulse sequences of the brain and surrounding
structures were obtained without and with intravenous contrast.
CONTRAST:  18mL MULTIHANCE GADOBENATE DIMEGLUMINE 529 MG/ML IV SOLN

[Series 5: T1 · sagittal · 4.0mm · 0.75mm/px · 1 of 31 slices shown (1 of 3)]
[im 1/31]
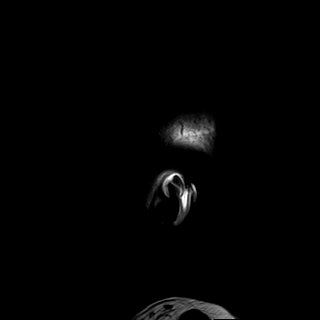

[Series 6: DWI · axial · 3.0mm · 0.94mm/px · z∈[-126,+29]mm · 9 of 176 slices shown (1 of 3)]
[im 1/176]
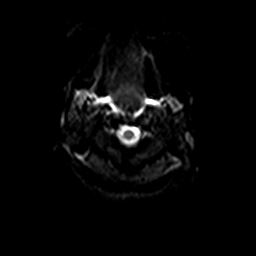
[im 22/176]
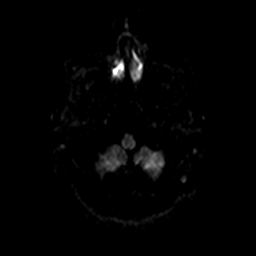
[im 44/176]
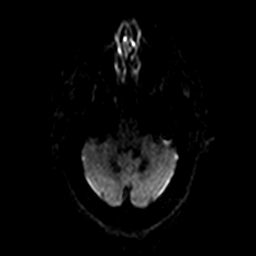
[im 66/176]
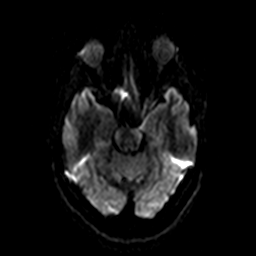
[im 88/176]
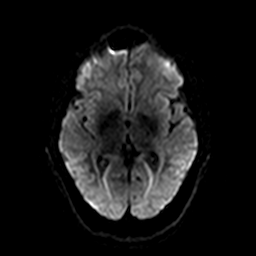
[im 110/176]
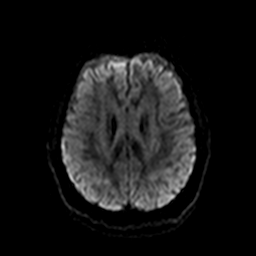
[im 132/176]
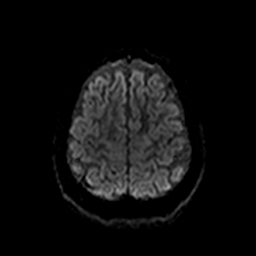
[im 154/176]
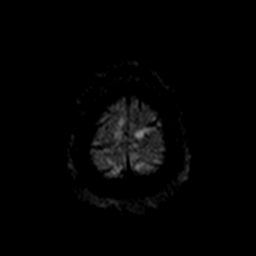
[im 176/176]
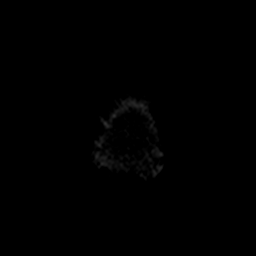

[Series 7: ax dwi_tracew · axial · 3.0mm · 0.94mm/px · z∈[-126,+29]mm · 5 of 88 slices shown]
[im 1/88]
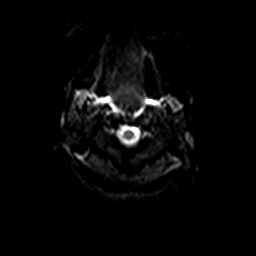
[im 22/88]
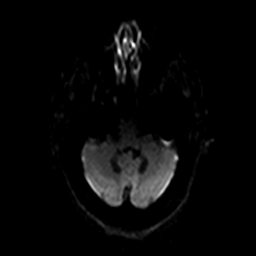
[im 44/88]
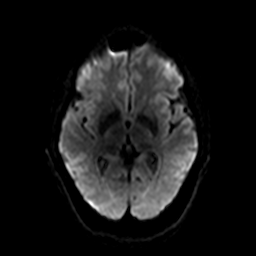
[im 66/88]
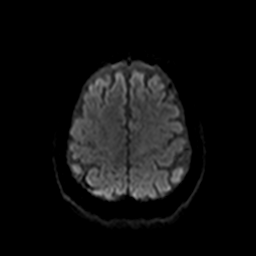
[im 88/88]
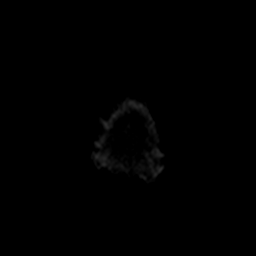

[Series 8: ax dwi_adc · axial · 3.0mm · 0.94mm/px · z∈[-126,+29]mm · 2 of 42 slices shown]
[im 1/42]
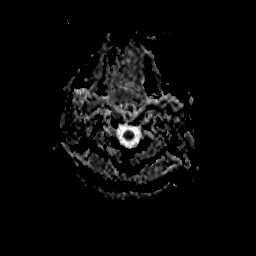
[im 42/42]
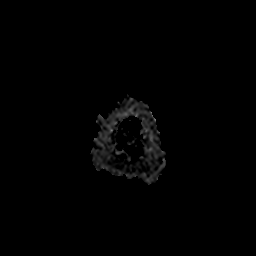

[Series 9: DWI · coronal · 5.0mm · 1.44mm/px · 3 of 66 slices shown (2 of 3)]
[im 1/66]
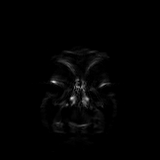
[im 33/66]
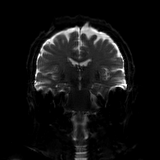
[im 66/66]
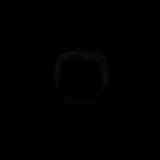

[Series 10: DWI · coronal · 5.0mm · 1.44mm/px · 2 of 33 slices shown (3 of 3)]
[im 1/33]
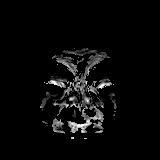
[im 33/33]
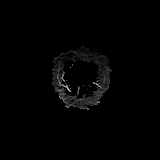

[Series 11: T2 · axial · 4.0mm · 0.36mm/px · z∈[-146,+14]mm · 2 of 32 slices shown]
[im 1/32]
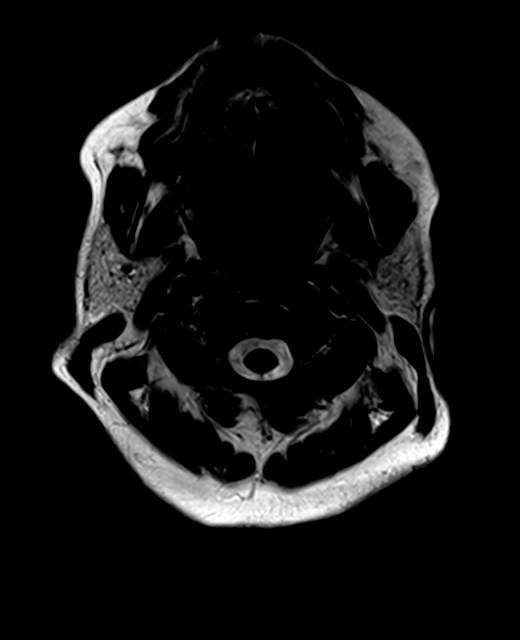
[im 32/32]
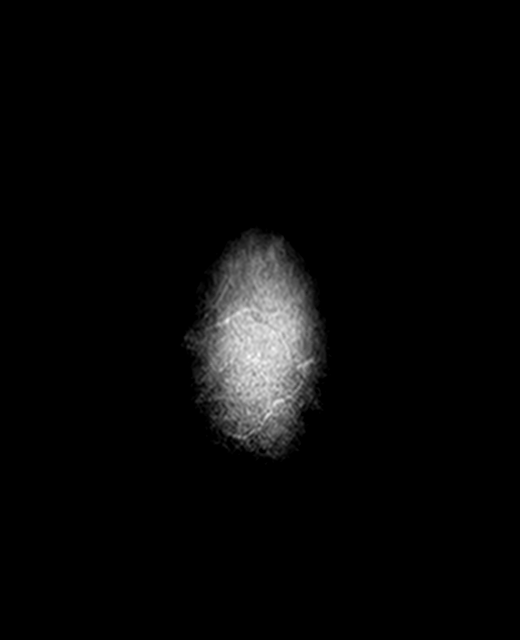

[Series 12: FLAIR · axial · 3.0mm · 0.72mm/px · 1 of 26 slices shown]
[im 1/26]
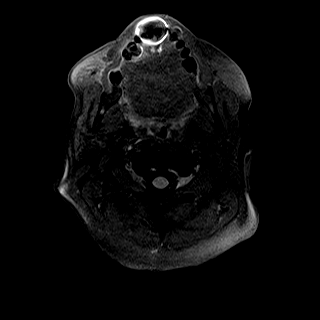

[Series 14: swi_images · axial · 3.0mm · 0.90mm/px · z∈[-148,+16]mm · 3 of 56 slices shown]
[im 1/56]
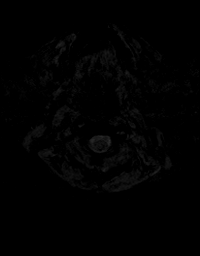
[im 28/56]
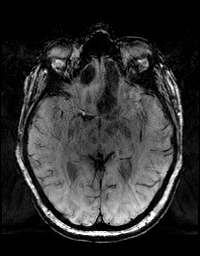
[im 56/56]
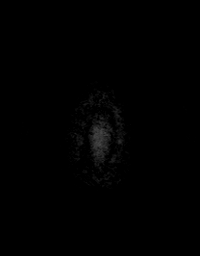

[Series 15: T1 · axial · 1.0mm · 0.90mm/px · z∈[-149,+7]mm · 8 of 160 slices shown (2 of 3)]
[im 1/160]
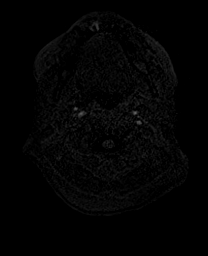
[im 23/160]
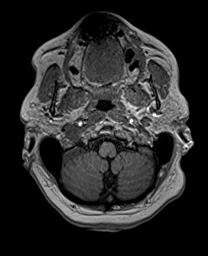
[im 46/160]
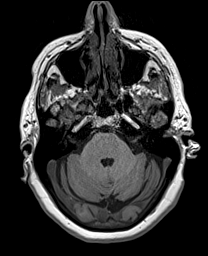
[im 69/160]
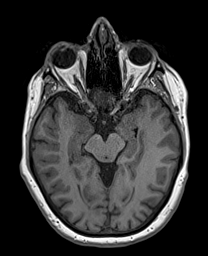
[im 91/160]
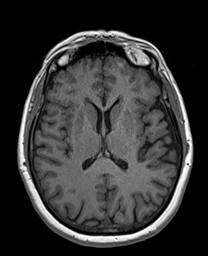
[im 114/160]
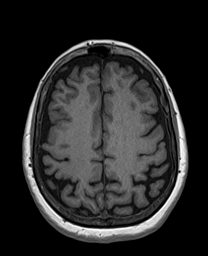
[im 137/160]
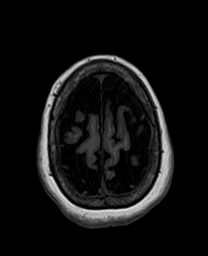
[im 160/160]
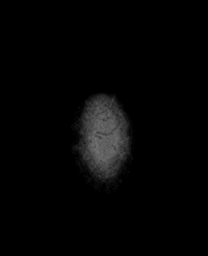

[Series 16: T2 post-contrast · coronal · 4.5mm · 0.36mm/px · 2 of 33 slices shown]
[im 1/33]
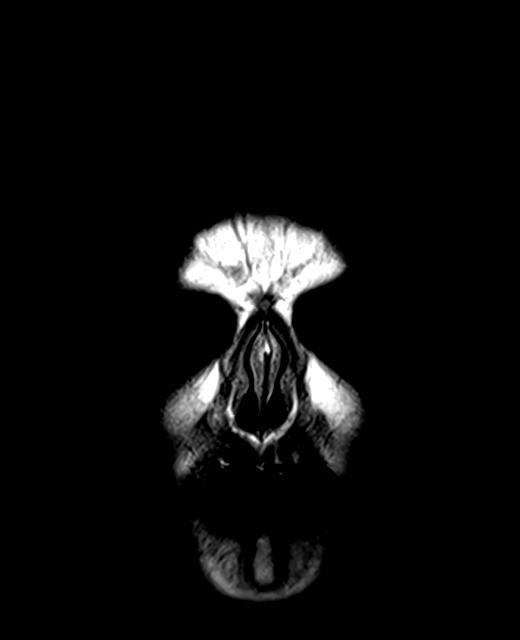
[im 33/33]
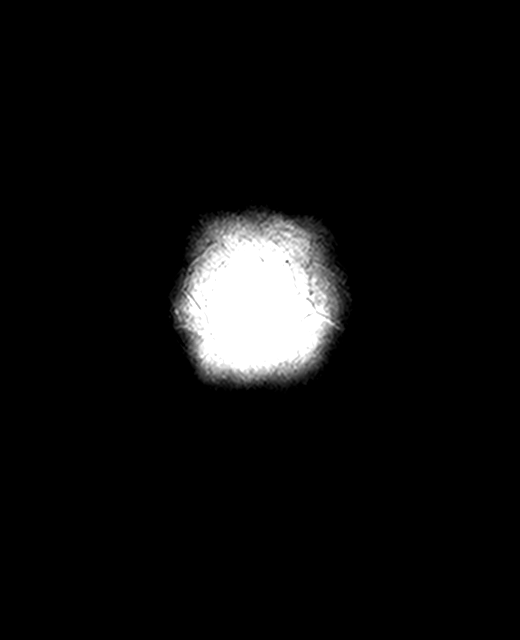

[Series 17: T1 · axial · 1.0mm · 0.94mm/px · z∈[-158,-2]mm · 8 of 160 slices shown (3 of 3)]
[im 1/160]
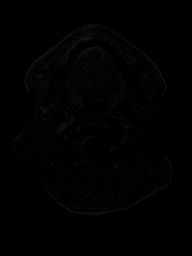
[im 23/160]
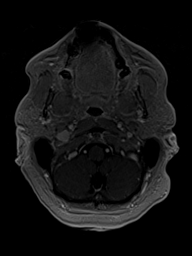
[im 46/160]
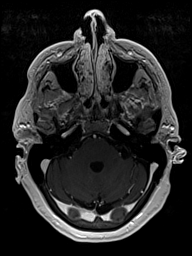
[im 69/160]
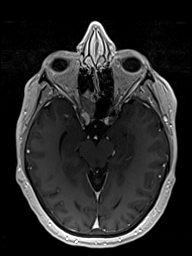
[im 91/160]
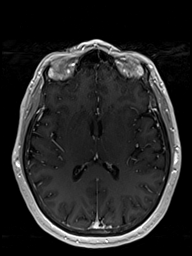
[im 114/160]
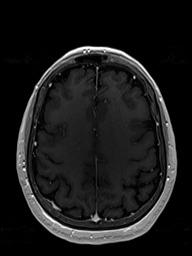
[im 137/160]
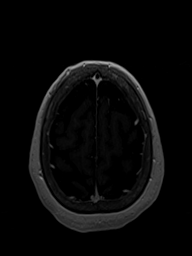
[im 160/160]
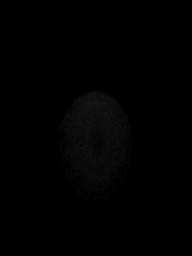

[Series 18: T1 post-contrast · coronal · 4.5mm · 0.72mm/px · 2 of 33 slices shown]
[im 1/33]
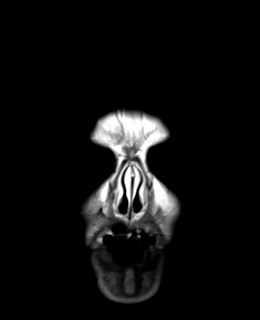
[im 33/33]
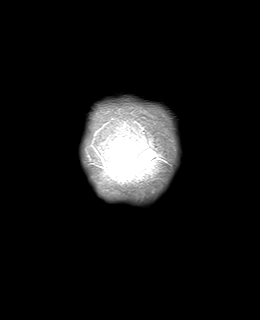

[48 of 48 positions shown; findings below may reference images not displayed]

FINDINGS: Brain: No restricted diffusion to suggest acute or subacute infarct.
No acute hemorrhage, mass, mass effect, or midline shift. No
hemosiderin deposition to suggest remote hemorrhage. No
hydrocephalus or extra-axial collection. Cerebral volume is within
normal limits for age. No abnormal parenchymal or meningeal
enhancement.

Vascular: Normal arterial flow voids. Normal arterial enhancement.
Venous sinuses are patent on postcontrast imaging.

Skull and upper cervical spine: Normal marrow signal. Redemonstrated
high right frontoparietal scalp lesion which is overall unchanged in
size from the prior CT.

Sinuses/Orbits: Chronic right maxillary sinusitis. Otherwise clear
paranasal sinuses. The orbits are unremarkable.

Other: The mastoids are well aerated.
IMPRESSION: No acute intracranial process. No abnormal enhancement. No evidence
of acute or subacute infarct.

## 2022-02-08 MED ORDER — GADOBENATE DIMEGLUMINE 529 MG/ML IV SOLN
18.0000 mL | Freq: Once | INTRAVENOUS | Status: AC | PRN
  Administered 2022-02-08: 18 mL via INTRAVENOUS

## 2022-02-20 ENCOUNTER — Other Ambulatory Visit: Payer: Self-pay | Admitting: General Surgery

## 2022-03-14 ENCOUNTER — Encounter: Payer: Self-pay | Admitting: Internal Medicine

## 2022-03-19 ENCOUNTER — Other Ambulatory Visit: Payer: Self-pay

## 2022-03-22 ENCOUNTER — Encounter: Payer: Self-pay | Admitting: Internal Medicine

## 2022-03-23 ENCOUNTER — Other Ambulatory Visit: Payer: Self-pay | Admitting: Family Medicine

## 2022-03-23 DIAGNOSIS — Z1231 Encounter for screening mammogram for malignant neoplasm of breast: Secondary | ICD-10-CM

## 2022-03-26 ENCOUNTER — Encounter: Payer: Self-pay | Admitting: Internal Medicine

## 2022-04-02 ENCOUNTER — Ambulatory Visit
Payer: Medicare Other | Attending: Student in an Organized Health Care Education/Training Program | Admitting: Student in an Organized Health Care Education/Training Program

## 2022-04-02 ENCOUNTER — Encounter: Payer: Self-pay | Admitting: Student in an Organized Health Care Education/Training Program

## 2022-04-02 ENCOUNTER — Other Ambulatory Visit: Payer: 59

## 2022-04-02 ENCOUNTER — Ambulatory Visit: Payer: 59 | Admitting: Internal Medicine

## 2022-04-02 ENCOUNTER — Ambulatory Visit
Admission: RE | Admit: 2022-04-02 | Discharge: 2022-04-02 | Disposition: A | Discharge status: Home / Self Care | Payer: Medicare Other | Source: Ambulatory Visit | Attending: Student in an Organized Health Care Education/Training Program | Admitting: Student in an Organized Health Care Education/Training Program

## 2022-04-02 VITALS — BP 143/97 | HR 97 | Temp 97.7°F | Resp 16 | Ht 61.0 in | Wt 200.5 lb

## 2022-04-02 DIAGNOSIS — G894 Chronic pain syndrome: Secondary | ICD-10-CM

## 2022-04-02 DIAGNOSIS — M5116 Intervertebral disc disorders with radiculopathy, lumbar region: Secondary | ICD-10-CM | POA: Diagnosis present

## 2022-04-02 DIAGNOSIS — M47816 Spondylosis without myelopathy or radiculopathy, lumbar region: Secondary | ICD-10-CM | POA: Diagnosis present

## 2022-04-02 DIAGNOSIS — M5136 Other intervertebral disc degeneration, lumbar region: Secondary | ICD-10-CM

## 2022-04-02 DIAGNOSIS — M51369 Other intervertebral disc degeneration, lumbar region without mention of lumbar back pain or lower extremity pain: Secondary | ICD-10-CM | POA: Insufficient documentation

## 2022-04-02 NOTE — Patient Instructions (Signed)

## 2022-04-02 NOTE — Progress Notes (Signed)
Patient: Danielle Lewis  Service Category: E/M  Provider: Gillis Santa, MD  DOB: Jan 17, 1956  DOS: 04/02/2022  Referring Provider: Doyle Askew, MD  MRN: [id number]  Setting: Ambulatory outpatient  PCP: Peggye Form, NP  Type: New Patient  Specialty: Interventional Pain Management    Location: Office  Delivery: Face-to-face     Primary Reason(s) for Visit: Encounter for initial evaluation of one or more chronic problems (new to examiner) potentially causing chronic pain, and posing a threat to normal musculoskeletal function. (Level of risk: High) CC: Back Pain (lower)  HPI  Danielle Lewis is a 66 y.o. year old, female patient, who comes for the first time to our practice referred by Girtha Hake I, MD for our initial evaluation of her chronic pain. She has Generalized enlarged lymph nodes; Follicular lymphoma grade ii, lymph nodes of multiple sites ([place]); Goals of care, counseling/discussion; Left bundle branch block (LBBB) on electrocardiogram; Essential hypertension; Right ovarian cyst; Lumbar spondylosis; Lumbar disc herniation with radiculopathy (L5/S1); Lumbar degenerative disc disease; and Chronic pain syndrome on their problem list. Today she comes in for evaluation of her Back Pain (lower)  Pain Assessment: Location: Lower Back Radiating: radiating spasms down backs of legs bilat to ankle Onset: More than a month ago Duration: Chronic pain Quality: Spasm, Aching, Burning Severity: 9 /10 (subjective, self-reported pain score)  Effect on ADL: limits daily activities Timing: Constant Modifying factors: floating in pool, ice packs BP: (!) 143/97  HR: 97  Onset and Duration: Present longer than 3 months Cause of pain: Unknown Severity: Getting worse, NAS-11 at its worse: 09/10, NAS-11 at its best: 07/10, NAS-11 now: 09/10, and NAS-11 on the average: 09/10 Timing: Morning, Afternoon, Night, Not influenced by the time of the day, During activity or exercise, After activity or  exercise, and After a period of immobility Aggravating Factors: Bending, Climbing, Intercourse (sex), Kneeling, Lifiting, Motion, Nerve blocks, Prolonged sitting, Prolonged standing, Squatting, Stooping , Twisting, Walking, Walking uphill, and Walking downhill Alleviating Factors: Bending, Stretching, Cold packs, Hot packs, Lying down, Medications, Nerve blocks, Resting, Sitting, Sleeping, Standing, and Warm showers or baths Associated Problems: Day-time cramps, Night-time cramps, Dizziness, Nausea, Numbness, Personality changes, Spasms, Swelling, Tingling, Weakness, Pain that wakes patient up, and Pain that does not allow patient to sleep Quality of Pain: Aching, Agonizing, Annoying, Burning, Cruel, Distressing, Dull, Exhausting, Feeling of weight, Getting longer, Heavy, Horrible, Hot, Nagging, Pressure-like, Punishing, Sharp, Splitting, Stabbing, Tender, Tingling, Toothache-like, and Uncomfortable Previous Examinations or Tests: Bone scan, CT scan, and MRI scan Previous Treatments: Epidural steroid injections, Narcotic medications, and Pool exercises  Danielle Lewis is a 66 year old female who is being referred from Dr. Alba Destine for evaluation of stimulator.  She has a history of primarily low back pain with occasional radiation into her bilateral hips.  Her procedural history with Dr. Alba Destine as below:   -s/p bilateral L5-S1 TF ESI 12/29/2021 with no significant relief -s/p bilateral L3, L4 medial branch and dorsal rami blocks 01/29/2022 with 90% relief during the anesthetic phase -s/p bilateral L3, L4 medial branch and dorsal rami blocks 02/23/2022 with no significant relief during the anesthetic phase  She is currently weaning down her gabapentin which she did not find helpful.  She has established with neurology.  She states that she has not had a nerve conduction velocity or EMG study done.  She denies any lumbar spine surgery.  She states that the majority of her pain is in her lower back and in her buttocks  but does radiate  at times into her legs.  Of note, she does have a family history of addiction, she has 10 children 6 of her own and 4 that are adopted.  I informed her that we will avoid opioid analgesics especially since she is on alprazolam and Ambien and usually opioid medications are not effective for axial low back pain.  Today we discussed an interlaminar L5-S1 ESI, peripheral nerve stimulation of the L4 medial branch, spinal cord stimulator trial.  I explained all of these procedures at length and in great detail while reviewing her MRI with her.  At this point, the patient would like to try an interlaminar L5-S1 ESI.  If this is not effective, we can consider Sprint peripheral nerve stimulation of the L4 medial branch nerves.  Patient endorsed understanding.   Historic Controlled Substance Pharmacotherapy Review  PMP and historical list of controlled substances:   Currently on alprazolam and Ambien.  Gets small quantity of tramadol, usually 20/month.  Historical Monitoring: The patient  reports that she does not currently use drugs. List of prior UDS Testing: No results found for: "MDMA", "COCAINSCRNUR", "PCPSCRNUR", "PCPQUANT", "CANNABQUANT", "THCU", "ETH", "CBDTHCR", "D8THCCBX", "D9THCCBX" Historical Background Evaluation: [place] PMP: PDMP not reviewed this encounter. Review of the past 53-month conducted.              [place] Department of public safety, offender search: (Editor, commissioningInformation) Non-contributory Risk Assessment Profile: Aberrant behavior: None observed or detected today Risk factors for fatal opioid overdose: family history of addiction Fatal overdose hazard ratio (HR): Calculation deferred Non-fatal overdose hazard ratio (HR): Calculation deferred Risk of opioid abuse or dependence: 0.7-3.0% with doses ? 36 MME/day and 6.1-26% with doses ? 120 MME/day. Substance use disorder (SUD) risk level: See below Personal History of Substance Abuse (SUD-Substance use disorder):   Alcohol: Negative  Illegal Drugs: Negative  Rx Drugs: Negative  ORT Risk Level calculation: Moderate Risk  Opioid Risk Tool - 04/02/22 1356       Family History of Substance Abuse   Alcohol Positive Female    Illegal Drugs Positive Female    Rx Drugs Negative      Personal History of Substance Abuse   Alcohol Negative    Illegal Drugs Negative    Rx Drugs Negative      Age   Age between 156-45years  No      Psychological Disease   Psychological Disease Negative    Depression Positive      Total Score   Opioid Risk Tool Scoring 4    Opioid Risk Interpretation Moderate Risk            ORT Scoring interpretation table:  Score <3 = Low Risk for SUD  Score between 4-7 = Moderate Risk for SUD  Score >8 = High Risk for Opioid Abuse   PHQ-2 Depression Scale:  Total score:    PHQ-2 Scoring interpretation table: (Score and probability of major depressive disorder)  Score 0 = No depression  Score 1 = 15.4% Probability  Score 2 = 21.1% Probability  Score 3 = 38.4% Probability  Score 4 = 45.5% Probability  Score 5 = 56.4% Probability  Score 6 = 78.6% Probability   PHQ-9 Depression Scale:  Total score:    PHQ-9 Scoring interpretation table:  Score 0-4 = No depression  Score 5-9 = Mild depression  Score 10-14 = Moderate depression  Score 15-19 = Moderately severe depression  Score 20-27 = Severe depression (2.4 times higher risk of SUD and 2.89 times  higher risk of overuse)   Pharmacologic Plan: No opioid analgesics.            Initial impression: Poor candidate for opioid analgesics.  We will focus on interventional based therapies.  Meds   Current Outpatient Medications:    ALBUTEROL IN, Inhale into the lungs., Disp: , Rfl:    ALPRAZolam (XANAX) 0.5 MG tablet, Take 0.5 mg by mouth in the morning and at bedtime., Disp: , Rfl:    amLODipine (NORVASC) 5 MG tablet, Take 1 tablet by mouth once daily, Disp: 30 tablet, Rfl: 0   cetirizine (ZYRTEC) 10 MG tablet, Take  10 mg by mouth daily as needed for allergies., Disp: , Rfl:    escitalopram (LEXAPRO) 20 MG tablet, Take 20 mg by mouth at bedtime., Disp: , Rfl:    gabapentin (NEURONTIN) 300 MG capsule, Take 300 mg by mouth 3 (three) times daily., Disp: , Rfl:    naproxen (NAPROSYN) 500 MG tablet, Take 500 mg by mouth 2 (two) times daily., Disp: , Rfl:    omeprazole (PRILOSEC) 20 MG capsule, Take 20 mg by mouth daily., Disp: , Rfl:    traZODone (DESYREL) 100 MG tablet, Take 100 mg by mouth at bedtime., Disp: , Rfl:    zolpidem (AMBIEN CR) 6.25 MG CR tablet, Take 1 tablet (6.25 mg total) by mouth at bedtime as needed. for sleep, Disp: 30 tablet, Rfl: 0  Imaging Review  Lumbosacral Imaging: Lumbar MR wo contrast: Results for orders placed during the hospital encounter of 11/06/21  MR LUMBAR SPINE WO CONTRAST  Narrative CLINICAL DATA:  Acute midline low back pain without sciatica M54.50 (ICD-10-CM).  EXAM: MRI LUMBAR SPINE WITHOUT CONTRAST  TECHNIQUE: Multiplanar, multisequence MR imaging of the lumbar spine was performed. No intravenous contrast was administered.  COMPARISON:  None.  FINDINGS: Segmentation:  Standard.  Alignment:  Physiologic.  Vertebrae: No fracture, evidence of discitis, or aggressive bone lesion. Hemangioma in the L1 vertebral body.  Conus medullaris and cauda equina: Conus extends to the T12-L1 level. Conus and cauda equina appear normal.  Paraspinal and other soft tissues: Bilateral renal cysts.  Disc levels:  T12-L1: Tiny central disc protrusion. No significant spinal canal or neural foraminal stenosis.  L1-2: No spinal canal or neural foraminal stenosis.  L2-3: Tiny right foraminal disc protrusion and mild facet degenerative changes. No significant spinal canal or neural foraminal stenosis.  L3-4: Shallow disc bulge and mild facet degenerative changes without significant spinal canal or neural foraminal stenosis.  L4-5: Shallow disc bulge and mild facet  degenerative changes without significant spinal canal or neural foraminal stenosis.  L5-S1: Disc bulge with right foraminal/far lateral disc osteophyte complex and mild facet degenerative changes resulting in moderate right neural foraminal narrowing, impinging on the exiting right L5 nerve root. No significant spinal canal stenosis.  IMPRESSION: Mild degenerative changes of the lumbar spine, more pronounced at L5-S1 where there is moderate right neural foraminal narrowing, impinging on the exiting right L5 nerve root.   Electronically Signed By: Pedro Earls M.D. On: 11/06/2021 09:14   Complexity Note: Imaging results reviewed.                         ROS  Cardiovascular: High blood pressure, chest pain Pulmonary or Respiratory: Shortness of breath, Snoring , and Temporary stoppage of breathing during sleep Neurological: No reported neurological signs or symptoms such as seizures, abnormal skin sensations, urinary and/or fecal incontinence, being born with an abnormal  open spine and/or a tethered spinal cord Psychological-Psychiatric: Difficulty sleeping and or falling asleep Gastrointestinal: No reported gastrointestinal signs or symptoms such as vomiting or evacuating blood, reflux, heartburn, alternating episodes of diarrhea and constipation, inflamed or scarred liver, or pancreas or irrregular and/or infrequent bowel movements Genitourinary: No reported renal or genitourinary signs or symptoms such as difficulty voiding or producing urine, peeing blood, non-functioning kidney, kidney stones, difficulty emptying the bladder, difficulty controlling the flow of urine, or chronic kidney disease Hematological: Bleeding easily Endocrine: No reported endocrine signs or symptoms such as high or low blood sugar, rapid heart rate due to high thyroid levels, obesity or weight gain due to slow thyroid or thyroid disease Rheumatologic: No reported rheumatological signs and  symptoms such as fatigue, joint pain, tenderness, swelling, redness, heat, stiffness, decreased range of motion, with or without associated rash Musculoskeletal: Negative for myasthenia gravis, muscular dystrophy, multiple sclerosis or malignant hyperthermia Work History: Quit going to work on his/her own  Allergies  Ms. Ellner is allergic to penicillins.  Laboratory Chemistry Profile   Renal Lab Results  Component Value Date   BUN 18 01/05/2022   CREATININE 1.30 (H) 01/05/2022   BCR 15 05/09/2015   GFRAA >60 05/03/2020   GFRNONAA 46 (L) 01/05/2022     Electrolytes Lab Results  Component Value Date   NA 139 01/05/2022   K 3.8 01/05/2022   CL 107 01/05/2022   CALCIUM 8.7 (L) 01/05/2022     Hepatic Lab Results  Component Value Date   AST 47 (H) 01/05/2022   ALT 38 01/05/2022   ALBUMIN 4.0 01/05/2022   ALKPHOS 102 01/05/2022     ID Lab Results  Component Value Date   HIV Non Reactive 01/11/2020   SARSCOV2NAA NEGATIVE 02/22/2020   HCVAB NON REACTIVE 01/11/2020     Bone No results found for: "VD25OH", "VD125OH2TOT", "MW1027OZ3", "GU4403KV4", "25OHVITD1", "25OHVITD2", "25OHVITD3", "TESTOFREE", "TESTOSTERONE"   Endocrine Lab Results  Component Value Date   GLUCOSE 81 01/05/2022   TSH 3.370 05/09/2015     Neuropathy Lab Results  Component Value Date   HIV Non Reactive 01/11/2020     CNS No results found for: "COLORCSF", "APPEARCSF", "RBCCOUNTCSF", "WBCCSF", "POLYSCSF", "LYMPHSCSF", "EOSCSF", "PROTEINCSF", "GLUCCSF", "JCVIRUS", "CSFOLI", "IGGCSF", "LABACHR", "ACETBL"   Inflammation (CRP: Acute  ESR: Chronic) No results found for: "CRP", "ESRSEDRATE", "LATICACIDVEN"   Rheumatology No results found for: "RF", "ANA", "LABURIC", "URICUR", "LYMEIGGIGMAB", "LYMEABIGMQN", "HLAB27"   Coagulation Lab Results  Component Value Date   PLT 213 01/05/2022     Cardiovascular Lab Results  Component Value Date   HGB 14.1 01/05/2022   HCT 40.7 01/05/2022      Screening Lab Results  Component Value Date   SARSCOV2NAA NEGATIVE 02/22/2020   HCVAB NON REACTIVE 01/11/2020   HIV Non Reactive 01/11/2020     Cancer No results found for: "CEA", "CA125", "LABCA2"   Allergens No results found for: "ALMOND", "APPLE", "ASPARAGUS", "AVOCADO", "BANANA", "BARLEY", "BASIL", "BAYLEAF", "GREENBEAN", "LIMABEAN", "WHITEBEAN", "BEEFIGE", "REDBEET", "BLUEBERRY", "BROCCOLI", "CABBAGE", "MELON", "CARROT", "CASEIN", "CASHEWNUT", "CAULIFLOWER", "CELERY"     Note: Lab results reviewed.  [place]  Drug: Ms. Sisler  reports that she does not currently use drugs. Alcohol:  reports that she does not currently use alcohol after a past usage of about 1.0 standard drink of alcohol per week. Tobacco:  reports that she has never smoked. She has never used smokeless tobacco. Medical:  has a past medical history of Anxiety, Arthritis, Cancer ([place]), COVID-19 (2021), Depression, GERD (gastroesophageal reflux disease), Headache, Hypertension,  and Pneumonia. Family: family history includes Breast cancer in her mother; Cancer in her mother.  Past Surgical History:  Procedure Laterality Date   ABDOMINAL HYSTERECTOMY     APPENDECTOMY     BREAST BIOPSY Right 04/28/2021   U/s bx 7:00/2cmfn"heart" marker-INTRADUCTAL PAPILLOMA WITH APOCRINE METAPLASIA   CHOLECYSTECTOMY     28years ago   DILATION AND CURETTAGE OF UTERUS     EXCISION OF BREAST BIOPSY Right 05/19/2021   Procedure: EXCISION OF BREAST BIOPSY w/ Radio frequency tag;  Surgeon: Herbert Pun, MD;  Location: [facility];  Service: General;  Laterality: Right;   LAPAROSCOPIC BILATERAL SALPINGO OOPHERECTOMY Bilateral 07/06/2021   Procedure: LAPAROSCOPIC BILATERAL SALPINGO OOPHORECTOMY WITH CYSTOSCOPY;  Surgeon: Malachy Mood, MD;  Location: [facility];  Service: Gynecology;  Laterality: Bilateral;   LYMPH GLAND EXCISION Right 01/22/2020   Procedure: CERVICAL LYMPH GLAND EXCISION;  Surgeon: Herbert Pun, MD;   Location: [facility];  Service: General;  Laterality: Right;   PORTACATH PLACEMENT N/A 02/24/2020   Procedure: INSERTION PORT-A-CATH;  Surgeon: Herbert Pun, MD;  Location: [facility];  Service: General;  Laterality: N/A;   TONSILLECTOMY     Active Ambulatory Problems    Diagnosis Date Noted   Generalized enlarged lymph nodes 16/05/9603   Follicular lymphoma grade ii, lymph nodes of multiple sites ([place]) 01/29/2020   Goals of care, counseling/discussion 02/12/2020   Left bundle branch block (LBBB) on electrocardiogram 01/20/2020   Essential hypertension 02/03/2020   Right ovarian cyst    Lumbar spondylosis 04/02/2022   Lumbar disc herniation with radiculopathy (L5/S1) 04/02/2022   Lumbar degenerative disc disease 04/02/2022   Chronic pain syndrome 04/02/2022   Resolved Ambulatory Problems    Diagnosis Date Noted   No Resolved Ambulatory Problems   Past Medical History:  Diagnosis Date   Anxiety    Arthritis    Cancer ([place])    COVID-19 2021   Depression    GERD (gastroesophageal reflux disease)    Headache    Hypertension    Pneumonia    Constitutional Exam  General appearance: Well nourished, well developed, and well hydrated. In no apparent acute distress Vitals:   04/02/22 1347  BP: (!) 143/97  Pulse: 97  Resp: 16  Temp: 97.7 F (36.5 C)  TempSrc: Temporal  SpO2: 97%  Weight: 200 lb 8 oz (90.9 kg)  Height: '5\' 1"'  (1.549 m)   BMI Assessment: Estimated body mass index is 37.88 kg/m as calculated from the following:   Height as of this encounter: '5\' 1"'  (1.549 m).   Weight as of this encounter: 200 lb 8 oz (90.9 kg).  BMI interpretation table: BMI level Category Range association with higher incidence of chronic pain  <18 kg/m2 Underweight   18.5-24.9 kg/m2 Ideal body weight   25-29.9 kg/m2 Overweight Increased incidence by 20%  30-34.9 kg/m2 Obese (Class I) Increased incidence by 68%  35-39.9 kg/m2 Severe obesity (Class II) Increased incidence by 136%   >40 kg/m2 Extreme obesity (Class III) Increased incidence by 254%   Patient's current BMI Ideal Body weight  Body mass index is 37.88 kg/m. Ideal body weight: 47.8 kg (105 lb 6.1 oz) Adjusted ideal body weight: 65.1 kg (143 lb 6.8 oz)   BMI Readings from Last 4 Encounters:  04/02/22 37.88 kg/m  01/05/22 34.61 kg/m  10/06/21 35.98 kg/m  08/16/21 35.07 kg/m   Wt Readings from Last 4 Encounters:  04/02/22 200 lb 8 oz (90.9 kg)  01/05/22 195 lb 6.4 oz (88.6 kg)  10/06/21 203 lb  1.6 oz (92.1 kg)  08/16/21 198 lb (89.8 kg)    Psych/Mental status: Alert, oriented x 3 (person, place, & time)       Eyes: PERLA Respiratory: No evidence of acute respiratory distress  Lumbar Spine Area Exam  Skin & Axial Inspection: No masses, redness, or swelling Alignment: Symmetrical Functional ROM: Pain restricted ROM       Stability: No instability detected Muscle Tone/Strength: Functionally intact. No obvious neuro-muscular anomalies detected. Sensory (Neurological): Dermatomal pain pattern and facet mediated Palpation: No palpable anomalies        Gait & Posture Assessment  Ambulation: Unassisted Gait: Relatively normal for age and body habitus Posture: WNL  Lower Extremity Exam    Side: Right lower extremity  Side: Left lower extremity  Stability: No instability observed          Stability: No instability observed          Skin & Extremity Inspection: Skin color, temperature, and hair growth are WNL. No peripheral edema or cyanosis. No masses, redness, swelling, asymmetry, or associated skin lesions. No contractures.  Skin & Extremity Inspection: Skin color, temperature, and hair growth are WNL. No peripheral edema or cyanosis. No masses, redness, swelling, asymmetry, or associated skin lesions. No contractures.  Functional ROM: Pain restricted ROM for hip and knee joints          Functional ROM: Pain restricted ROM for hip and knee joints          Muscle Tone/Strength: Functionally  intact. No obvious neuro-muscular anomalies detected.  Muscle Tone/Strength: Functionally intact. No obvious neuro-muscular anomalies detected.  Sensory (Neurological): Neurogenic pain pattern        Sensory (Neurological): Neurogenic pain pattern        DTR: Patellar: deferred today Achilles: deferred today Plantar: deferred today  DTR: Patellar: deferred today Achilles: deferred today Plantar: deferred today  Palpation: No palpable anomalies  Palpation: No palpable anomalies   5 out of 5 strength bilateral lower extremity: Plantar flexion, dorsiflexion, knee flexion, knee extension.   Assessment  Primary Diagnosis & Pertinent Problem List: The primary encounter diagnosis was Lumbar disc herniation with radiculopathy (L5/S1). Diagnoses of Lumbar facet arthropathy, Lumbar spondylosis, Lumbar degenerative disc disease, and Chronic pain syndrome were also pertinent to this visit.  Visit Diagnosis (New problems to examiner): 1. Lumbar disc herniation with radiculopathy (L5/S1)   2. Lumbar facet arthropathy   3. Lumbar spondylosis   4. Lumbar degenerative disc disease   5. Chronic pain syndrome    Plan of Care (Initial workup plan)   I informed her that we will avoid opioid analgesics especially since she is on alprazolam and Ambien and usually opioid medications are not effective for axial low back pain.  Today we discussed an interlaminar L5-S1 ESI, peripheral nerve stimulation of the L4 medial branch, spinal cord stimulator trial.  I explained all of these procedures at length and in great detail while reviewing her MRI with her.  At this point, the patient would like to try an interlaminar L5-S1 ESI.  If this is not effective, we can consider Sprint peripheral nerve stimulation of the L4 medial branch nerves.  Patient endorsed understanding.  Lab Orders         Compliance Drug Analysis, Ur     Imaging Orders         DG PAIN CLINIC C-ARM 1-60 MIN NO REPORT     Procedure Orders          Lumbar Epidural Injection  Provider-requested follow-up: Return in 2 days (on 04/04/2022) for L5/S1 ESI , in clinic NS.  I spent a total of 60 minutes reviewing chart data, face-to-face evaluation with the patient, counseling and coordination of care as detailed above.   Future Appointments  Date Time Provider [place]  04/26/2022 10:00 AM [facility] MM DEXA [facility]  07/18/2022  2:15 PM [facility] None  07/18/2022  2:30 PM Cammie Sickle, MD [facility] None    Note by: Gillis Santa, MD Date: 04/02/2022; Time: 2:32 PM

## 2022-04-02 NOTE — Progress Notes (Signed)
Safety precautions to be maintained throughout the outpatient stay will include: orient to surroundings, keep bed in low position, maintain call bell within reach at all times, provide assistance with transfer out of bed and ambulation.  

## 2022-04-05 LAB — COMPLIANCE DRUG ANALYSIS, UR

## 2022-04-11 ENCOUNTER — Ambulatory Visit
Payer: Medicare Other | Attending: Student in an Organized Health Care Education/Training Program | Admitting: Student in an Organized Health Care Education/Training Program

## 2022-04-11 ENCOUNTER — Encounter: Payer: Self-pay | Admitting: Student in an Organized Health Care Education/Training Program

## 2022-04-11 ENCOUNTER — Ambulatory Visit
Admission: RE | Admit: 2022-04-11 | Discharge: 2022-04-11 | Disposition: A | Discharge status: Home / Self Care | Payer: Medicare Other | Source: Ambulatory Visit | Attending: Student in an Organized Health Care Education/Training Program | Admitting: Student in an Organized Health Care Education/Training Program

## 2022-04-11 DIAGNOSIS — M5116 Intervertebral disc disorders with radiculopathy, lumbar region: Secondary | ICD-10-CM | POA: Diagnosis present

## 2022-04-11 DIAGNOSIS — G894 Chronic pain syndrome: Secondary | ICD-10-CM | POA: Diagnosis present

## 2022-04-11 MED ORDER — LIDOCAINE HCL 2 % IJ SOLN
20.0000 mL | Freq: Once | INTRAMUSCULAR | Status: AC
  Administered 2022-04-11: 400 mg
  Filled 2022-04-11: qty 20

## 2022-04-11 MED ORDER — IOHEXOL 180 MG/ML  SOLN
10.0000 mL | Freq: Once | INTRAMUSCULAR | Status: AC
  Administered 2022-04-11: 10 mL via EPIDURAL
  Filled 2022-04-11: qty 20

## 2022-04-11 MED ORDER — ROPIVACAINE HCL 2 MG/ML IJ SOLN
2.0000 mL | Freq: Once | INTRAMUSCULAR | Status: AC
  Administered 2022-04-11: 2 mL via EPIDURAL
  Filled 2022-04-11: qty 20

## 2022-04-11 MED ORDER — DEXAMETHASONE SODIUM PHOSPHATE 10 MG/ML IJ SOLN
10.0000 mg | Freq: Once | INTRAMUSCULAR | Status: AC
  Administered 2022-04-11: 10 mg
  Filled 2022-04-11: qty 1

## 2022-04-11 MED ORDER — SODIUM CHLORIDE 0.9% FLUSH
2.0000 mL | Freq: Once | INTRAVENOUS | Status: AC
  Administered 2022-04-11: 2 mL

## 2022-04-11 MED ORDER — SODIUM CHLORIDE (PF) 0.9 % IJ SOLN
INTRAMUSCULAR | Status: AC
  Filled 2022-04-11: qty 10

## 2022-04-11 NOTE — Progress Notes (Signed)
Safety precautions to be maintained throughout the outpatient stay will include: orient to surroundings, keep bed in low position, maintain call bell within reach at all times, provide assistance with transfer out of bed and ambulation.  

## 2022-04-11 NOTE — Patient Instructions (Signed)

## 2022-04-11 NOTE — Progress Notes (Signed)
PROVIDER NOTE: Interpretation of information contained herein should be left to medically-trained personnel. Specific patient instructions are provided elsewhere under "Patient Instructions" section of medical record. This document was created in part using STT-dictation technology, any transcriptional errors that may result from this process are unintentional.  Patient: Danielle Lewis Type: Established DOB: [birth year] MRN: [id number] PCP: Peggye Form, NP  Service: Procedure DOS: 04/11/2022 Setting: Ambulatory Location: Ambulatory outpatient facility Delivery: Face-to-face Provider: Gillis Santa, MD Specialty: Interventional Pain Management Specialty designation: 09 Location: Outpatient facility Ref. Prov.: Gillis Santa, MD    Primary Reason for Visit: Interventional Pain Management Treatment. CC: Back Pain   Procedure:           Type: Lumbar epidural steroid injection (LESI) (interlaminar) #1    Laterality: Left   Level:  L5-S1 Level.  Imaging: Fluoroscopic guidance         Anesthesia: Local anesthesia (1-2% Lidocaine) DOS: 04/11/2022  Performed by: Gillis Santa, MD  Purpose: Diagnostic/Therapeutic Indications: Lumbar radicular pain of intraspinal etiology of more than 4 weeks that has failed to respond to conservative therapy and is severe enough to impact quality of life or function. 1. Lumbar disc herniation with radiculopathy (L5/S1)   2. Chronic pain syndrome    NAS-11 Pain score:   Pre-procedure: 6 /10   Post-procedure: 3 /10      Position / Prep / Materials:  Position: Prone w/ head of the table raised (slight reverse trendelenburg) to facilitate breathing.  Prep solution: DuraPrep (Iodine Povacrylex [0.7% available iodine] and Isopropyl Alcohol, 74% w/w) Prep Area: Entire Posterior Lumbar Region from lower scapular tip down to mid buttocks area and from flank to flank. Materials:  Tray: Epidural tray Needle(s):  Type: Epidural needle (Tuohy) Gauge (G):   22 Length: Regular (3.5-in) Qty: 1  Pre-op H&P Assessment:  Danielle Lewis is a 66 y.o. (year old), female patient, seen today for interventional treatment. She  has a past surgical history that includes Cholecystectomy; Appendectomy; Tonsillectomy; Abdominal hysterectomy; Lymph gland excision (Right, 01/22/2020); Portacath placement (N/A, 02/24/2020); Breast biopsy (Right, 04/28/2021); Dilation and curettage of uterus; Excision of breast biopsy (Right, 05/19/2021); and Laparoscopic bilateral salpingo oophorectomy (Bilateral, 07/06/2021). Danielle Lewis has a current medication list which includes the following prescription(s): albuterol, alprazolam, amlodipine, cetirizine, escitalopram, gabapentin, naproxen, omeprazole, trazodone, and zolpidem. Her primarily concern today is the Back Pain  Initial Vital Signs:  Pulse/HCG Rate: 89  Temp:  (!) 97.2 F (36.2 C) Resp: 16 BP: 134/81 SpO2: 95 %  BMI: Estimated body mass index is 35.43 kg/m as calculated from the following:   Height as of this encounter: '5\' 3"'$  (1.6 m).   Weight as of this encounter: 200 lb (90.7 kg).  Risk Assessment: Allergies: Reviewed. She is allergic to penicillins.  Allergy Precautions: None required Coagulopathies: Reviewed. None identified.  Blood-thinner therapy: None at this time Active Infection(s): Reviewed. None identified. Danielle Lewis is afebrile  Site Confirmation: Danielle Lewis was asked to confirm the procedure and laterality before marking the site Procedure checklist: Completed Consent: Before the procedure and under the influence of no sedative(s), amnesic(s), or anxiolytics, the patient was informed of the treatment options, risks and possible complications. To fulfill our ethical and legal obligations, as recommended by the American Medical Association's Code of Ethics, I have informed the patient of my clinical impression; the nature and purpose of the treatment or procedure; the risks, benefits, and possible complications  of the intervention; the alternatives, including doing nothing; the risk(s) and benefit(s) of the  alternative treatment(s) or procedure(s); and the risk(s) and benefit(s) of doing nothing. The patient was provided information about the general risks and possible complications associated with the procedure. These may include, but are not limited to: failure to achieve desired goals, infection, bleeding, organ or nerve damage, allergic reactions, paralysis, and death. In addition, the patient was informed of those risks and complications associated to Spine-related procedures, such as failure to decrease pain; infection (i.e.: Meningitis, epidural or intraspinal abscess); bleeding (i.e.: epidural hematoma, subarachnoid hemorrhage, or any other type of intraspinal or peri-dural bleeding); organ or nerve damage (i.e.: Any type of peripheral nerve, nerve root, or spinal cord injury) with subsequent damage to sensory, motor, and/or autonomic systems, resulting in permanent pain, numbness, and/or weakness of one or several areas of the body; allergic reactions; (i.e.: anaphylactic reaction); and/or death. Furthermore, the patient was informed of those risks and complications associated with the medications. These include, but are not limited to: allergic reactions (i.e.: anaphylactic or anaphylactoid reaction(s)); adrenal axis suppression; blood sugar elevation that in diabetics may result in ketoacidosis or comma; water retention that in patients with history of congestive heart failure may result in shortness of breath, pulmonary edema, and decompensation with resultant heart failure; weight gain; swelling or edema; medication-induced neural toxicity; particulate matter embolism and blood vessel occlusion with resultant organ, and/or nervous system infarction; and/or aseptic necrosis of one or more joints. Finally, the patient was informed that Medicine is not an exact science; therefore, there is also the  possibility of unforeseen or unpredictable risks and/or possible complications that may result in a catastrophic outcome. The patient indicated having understood very clearly. We have given the patient no guarantees and we have made no promises. Enough time was given to the patient to ask questions, all of which were answered to the patient's satisfaction. Ms. Dillon has indicated that she wanted to continue with the procedure. Attestation: I, the ordering provider, attest that I have discussed with the patient the benefits, risks, side-effects, alternatives, likelihood of achieving goals, and potential problems during recovery for the procedure that I have provided informed consent. Date  Time: 04/11/2022 10:21 AM  Pre-Procedure Preparation:  Monitoring: As per clinic protocol. Respiration, ETCO2, SpO2, BP, heart rate and rhythm monitor placed and checked for adequate function Safety Precautions: Patient was assessed for positional comfort and pressure points before starting the procedure. Time-out: I initiated and conducted the "Time-out" before starting the procedure, as per protocol. The patient was asked to participate by confirming the accuracy of the "Time Out" information. Verification of the correct person, site, and procedure were performed and confirmed by me, the nursing staff, and the patient. "Time-out" conducted as per Joint Commission's Universal Protocol (UP.01.01.01). Time: 1151  Description/Narrative of Procedure:          Target: Epidural space via interlaminar opening, initially targeting the lower laminar border of the superior vertebral body. Region: Lumbar Approach: Percutaneous paravertebral  Rationale (medical necessity): procedure needed and proper for the diagnosis and/or treatment of the patient's medical symptoms and needs. Procedural Technique Safety Precautions: Aspiration looking for blood return was conducted prior to all injections. At no point did we inject any  substances, as a needle was being advanced. No attempts were made at seeking any paresthesias. Safe injection practices and needle disposal techniques used. Medications properly checked for expiration dates. SDV (single dose vial) medications used. Description of the Procedure: Protocol guidelines were followed. The procedure needle was introduced through the skin, ipsilateral to the reported pain,  and advanced to the target area. Bone was contacted and the needle walked caudad, until the lamina was cleared. The epidural space was identified using "loss-of-resistance technique" with 2-3 ml of PF-NaCl (0.9% NSS), in a 5cc LOR glass syringe.  5cc solution made of 2 cc of preservative-free saline, 2 cc of 0.2% ropivacaine, 1 cc of Decadron 10 mg/cc.   Vitals:   04/11/22 1032 04/11/22 1150 04/11/22 1155  BP: 134/81 (!) 148/108 (!) 146/103  Pulse: 89 85 86  Resp: '16 18 16  '$ Temp: (!) 97.2 F (36.2 C)    TempSrc: Temporal    SpO2: 95% 96% 97%  Weight: 200 lb (90.7 kg)    Height: '5\' 3"'$  (1.6 m)      Start Time: 1151 hrs. End Time: 1154 hrs.  Imaging Guidance (Spinal):          Type of Imaging Technique: Fluoroscopy Guidance (Spinal) Indication(s): Assistance in needle guidance and placement for procedures requiring needle placement in or near specific anatomical locations not easily accessible without such assistance. Exposure Time: Please see nurses notes. Contrast: Before injecting any contrast, we confirmed that the patient did not have an allergy to iodine, shellfish, or radiological contrast. Once satisfactory needle placement was completed at the desired level, radiological contrast was injected. Contrast injected under live fluoroscopy. No contrast complications. See chart for type and volume of contrast used. Fluoroscopic Guidance: I was personally present during the use of fluoroscopy. "Tunnel Vision Technique" used to obtain the best possible view of the target area. Parallax error  corrected before commencing the procedure. "Direction-depth-direction" technique used to introduce the needle under continuous pulsed fluoroscopy. Once target was reached, antero-posterior, oblique, and lateral fluoroscopic projection used confirm needle placement in all planes. Images permanently stored in EMR. Interpretation: I personally interpreted the imaging intraoperatively. Adequate needle placement confirmed in multiple planes. Appropriate spread of contrast into desired area was observed. No evidence of afferent or efferent intravascular uptake. No intrathecal or subarachnoid spread observed. Permanent images saved into the patient's record.  Antibiotic Prophylaxis:   Anti-infectives (From admission, onward)    None      Indication(s): None identified  Post-operative Assessment:  Post-procedure Vital Signs:  Pulse/HCG Rate: 86  Temp:  (!) 97.2 F (36.2 C) Resp: 16 BP:  (!) 146/103 SpO2: 97 %  EBL: None  Complications: No immediate post-treatment complications observed by team, or reported by patient.  Note: The patient tolerated the entire procedure well. A repeat set of vitals were taken after the procedure and the patient was kept under observation following institutional policy, for this type of procedure. Post-procedural neurological assessment was performed, showing return to baseline, prior to discharge. The patient was provided with post-procedure discharge instructions, including a section on how to identify potential problems. Should any problems arise concerning this procedure, the patient was given instructions to immediately contact us, at any time, without hesitation. In any case, we plan to contact the patient by telephone for a follow-up status report regarding this interventional procedure.  Comments:  No additional relevant information.  5 out of 5 strength bilateral lower extremity: Plantar flexion, dorsiflexion, knee flexion, knee extension.   Plan of  Care  Orders:  Orders Placed This Encounter  Procedures   DG PAIN [facility] C-ARM 1-60 MIN NO REPORT    Intraoperative interpretation by procedural physician at [facility].    Standing Status:   Standing    Number of Occurrences:   1    Order Specific Question:  Reason for exam:    Answer:   Assistance in needle guidance and placement for procedures requiring needle placement in or near specific anatomical locations not easily accessible without such assistance.     Medications ordered for procedure: Meds ordered this encounter  Medications   iohexol (OMNIPAQUE) 180 MG/ML injection 10 mL    Must be Myelogram-compatible. If not available, you may substitute with a water-soluble, non-ionic, hypoallergenic, myelogram-compatible radiological contrast medium.   lidocaine (XYLOCAINE) 2 % (with pres) injection 400 mg   sodium chloride flush (NS) 0.9 % injection 2 mL   ropivacaine (PF) 2 mg/mL (0.2%) (NAROPIN) injection 2 mL   dexamethasone (DECADRON) injection 10 mg   Medications administered: We administered iohexol, lidocaine, sodium chloride flush, ropivacaine (PF) 2 mg/mL (0.2%), and dexamethasone.  See the medical record for exact dosing, route, and time of administration.  Follow-up plan:   Return in about 4 weeks (around 05/09/2022) for Post Procedure Evaluation.       Left L5/S1 ESI 04/11/22   Recent Visits Date Type Provider Dept  04/02/22 Office Visit Gillis Santa, MD [facility]  Showing recent visits within past 90 days and meeting all other requirements Today's Visits Date Type Provider Dept  04/11/22 Procedure visit Gillis Santa, MD [facility]  Showing today's visits and meeting all other requirements Future Appointments Date Type Provider Dept  05/09/22 Appointment Gillis Santa, MD [facility]  Showing future appointments within next 90 days and meeting all other requirements  Disposition: Discharge home  Discharge (Date   Time): 04/11/2022; 1200 hrs.   Primary Care Physician: Peggye Form, NP Location: [facility] Note by: Gillis Santa, MD Date: 04/11/2022; Time: 12:05 PM  Disclaimer:  Medicine is not an exact science. The only guarantee in medicine is that nothing is guaranteed. It is important to note that the decision to proceed with this intervention was based on the information collected from the patient. The Data and conclusions were drawn from the patient's questionnaire, the interview, and the physical examination. Because the information was provided in large part by the patient, it cannot be guaranteed that it has not been purposely or unconsciously manipulated. Every effort has been made to obtain as much relevant data as possible for this evaluation. It is important to note that the conclusions that lead to this procedure are derived in large part from the available data. Always take into account that the treatment will also be dependent on availability of resources and existing treatment guidelines, considered by other Pain Management Practitioners as being common knowledge and practice, at the time of the intervention. For Medico-Legal purposes, it is also important to point out that variation in procedural techniques and pharmacological choices are the acceptable norm. The indications, contraindications, technique, and results of the above procedure should only be interpreted and judged by a Board-Certified Interventional Pain Specialist with extensive familiarity and expertise in the same exact procedure and technique.

## 2022-04-22 ENCOUNTER — Other Ambulatory Visit: Payer: Self-pay | Admitting: Internal Medicine

## 2022-04-23 ENCOUNTER — Encounter: Payer: Self-pay | Admitting: Internal Medicine

## 2022-04-23 MED ORDER — ZOLPIDEM TARTRATE ER 6.25 MG PO TBCR: 6.2500 mg | EXTENDED_RELEASE_TABLET | Freq: Every evening | ORAL | 0 refills | Status: AC | PRN

## 2022-04-26 ENCOUNTER — Ambulatory Visit
Admission: RE | Admit: 2022-04-26 | Discharge: 2022-04-26 | Disposition: A | Discharge status: Home / Self Care | Payer: Medicare Other | Source: Ambulatory Visit | Attending: Family Medicine | Admitting: Family Medicine

## 2022-04-26 DIAGNOSIS — Z1231 Encounter for screening mammogram for malignant neoplasm of breast: Secondary | ICD-10-CM | POA: Insufficient documentation

## 2022-04-26 DIAGNOSIS — R7303 Prediabetes: Secondary | ICD-10-CM | POA: Insufficient documentation

## 2022-05-09 ENCOUNTER — Ambulatory Visit
Payer: Medicare Other | Attending: Student in an Organized Health Care Education/Training Program | Admitting: Student in an Organized Health Care Education/Training Program

## 2022-05-09 ENCOUNTER — Encounter: Payer: Self-pay | Admitting: Student in an Organized Health Care Education/Training Program

## 2022-05-09 VITALS — BP 139/85 | HR 84 | Temp 97.2°F | Ht 63.0 in | Wt 200.0 lb

## 2022-05-09 DIAGNOSIS — M5136 Other intervertebral disc degeneration, lumbar region: Secondary | ICD-10-CM | POA: Insufficient documentation

## 2022-05-09 DIAGNOSIS — G894 Chronic pain syndrome: Secondary | ICD-10-CM | POA: Diagnosis not present

## 2022-05-09 DIAGNOSIS — M47816 Spondylosis without myelopathy or radiculopathy, lumbar region: Secondary | ICD-10-CM | POA: Diagnosis present

## 2022-05-09 MED ORDER — TIZANIDINE HCL 4 MG PO TABS: 4.0000 mg | ORAL_TABLET | Freq: Two times a day (BID) | ORAL | 1 refills | Status: AC | PRN

## 2022-05-09 NOTE — Progress Notes (Signed)
Safety precautions to be maintained throughout the outpatient stay will include: orient to surroundings, keep bed in low position, maintain call bell within reach at all times, provide assistance with transfer out of bed and ambulation.  

## 2022-05-09 NOTE — Progress Notes (Signed)
PROVIDER NOTE: Information contained herein reflects review and annotations entered in association with encounter. Interpretation of such information and data should be left to medically-trained personnel. Information provided to patient can be located elsewhere in the medical record under "Patient Instructions". Document created using STT-dictation technology, any transcriptional errors that may result from process are unintentional.    Patient: Danielle Lewis  Service Category: E/M  Provider: Gillis Santa, MD  DOB: 10/13/1955  DOS: 05/09/2022  Referring Provider: Peggye Form, NP  MRN: [id number]  Specialty: Interventional Pain Management  PCP: Danielle Form, NP  Type: Established Patient  Setting: Ambulatory outpatient    Location: Office  Delivery: Face-to-face     HPI  [name], a 66 y.o. year old female, is here today because of her Lumbar facet arthropathy [M47.816]. [name] primary complain today is Back Pain (lower) Last encounter: My last encounter with her was on 04/11/2022. Pertinent problems: [name] has Lumbar spondylosis; Lumbar disc herniation with radiculopathy (L5/S1); Lumbar degenerative disc disease; and Chronic pain syndrome on their pertinent problem list. Pain Assessment: Severity of Chronic pain is reported as a 8 /10. Location: Back Left, Right/pain radiaties down both leg to her ankle. Onset: More than a month ago. Quality: Aching, Throbbing, Sharp, Stabbing. Timing: Constant. Modifying factor(s): laying down. Vitals:  height is _0  (1.6 m) and weight is 200 lb (90.7 kg). Her temperature is 97.2 F (36.2 C) (abnormal). Her blood pressure is 139/85 and her pulse is 84. Her oxygen saturation is 98%.   Reason for encounter: Postprocedure evaluation after L5-S1 ESI   Post-procedure evaluation   Type: Lumbar epidural steroid injection (LESI) (interlaminar) #1    Laterality: Left   Level:  L5-S1 Level.  Imaging: Fluoroscopic guidance         Anesthesia:  Local anesthesia (1-2% Lidocaine) DOS: 04/11/2022  Performed by: Danielle Santa, MD  Purpose: Diagnostic/Therapeutic Indications: Lumbar radicular pain of intraspinal etiology of more than 4 weeks that has failed to respond to conservative therapy and is severe enough to impact quality of life or function. 1. Lumbar disc herniation with radiculopathy (L5/S1)   2. Chronic pain syndrome    NAS-11 Pain score:   Pre-procedure: 6 /10   Post-procedure: 3 /10      Effectiveness:  Initial hour after procedure: 100 %  Subsequent 4-6 hours post-procedure: 100 %  Analgesia past initial 6 hours: 30 % (lasted only 2 days)  Ongoing improvement:  Analgesic:  20% Function: Back to baseline ROM: Back to baseline   HPI from initial clinic visit 04/02/22 : Danielle Lewis is a 66 year old female who is being referred from Dr. Alba Lewis for evaluation of stimulator.  She has a history of primarily low back pain with occasional radiation into her bilateral hips.  Her procedural history with Dr. Alba Lewis as below:    -s/p bilateral L5-S1 TF ESI 12/29/2021 with no significant relief -s/p bilateral L3, L4 medial branch and dorsal rami blocks 01/29/2022 with 90% relief during the anesthetic phase -s/p bilateral L3, L4 medial branch and dorsal rami blocks 02/23/2022 with no significant relief during the anesthetic phase   She is currently weaning down her gabapentin which she did not find helpful.  She has established with neurology.  She states that she has not had a nerve conduction velocity or EMG study done.  She denies any lumbar spine surgery.  She states that the majority of her pain is in her lower back and in her buttocks but does  radiate at times into her legs.   Of note, she does have a family history of addiction, she has 10 children 6 of her own and 4 that are adopted.   I informed her that we will avoid opioid analgesics especially since she is on alprazolam and Ambien and usually opioid medications are not  effective for axial low back pain.   Today we discussed an interlaminar L5-S1 ESI, peripheral nerve stimulation of the L4 medial branch, spinal cord stimulator trial.  I explained all of these procedures at length and in great detail while reviewing her MRI with her.  At this point, the patient would like to try an interlaminar L5-S1 ESI.  If this is not effective, we can consider Sprint peripheral nerve stimulation of the L4 medial branch nerves.  Patient endorsed understanding.    ROS  Constitutional: Denies any fever or chills Gastrointestinal: No reported hemesis, hematochezia, vomiting, or acute GI distress Musculoskeletal:  Bilateral low back and buttock pain with radiation to bilateral ankles Neurological: No reported episodes of acute onset apraxia, aphasia, dysarthria, agnosia, amnesia, paralysis, loss of coordination, or loss of consciousness  Medication Review  ALPRAZolam, Albuterol, amLODipine, cetirizine, escitalopram, gabapentin, naproxen, omeprazole, tiZANidine, traZODone, and zolpidem  History Review  Allergy: [name] is allergic to penicillins. Drug: [name]  reports that she does not currently use drugs. Alcohol:  reports that she does not currently use alcohol after a past usage of about 1.0 standard drink of alcohol per week. Tobacco:  reports that she has never smoked. She has never used smokeless tobacco. Social: [name]  reports that she has never smoked. She has never used smokeless tobacco. She reports that she does not currently use alcohol after a past usage of about 1.0 standard drink of alcohol per week. She reports that she does not currently use drugs. Medical:  has a past medical history of Anxiety, Arthritis, Cancer ([place]), COVID-19 (2021), Depression, GERD (gastroesophageal reflux disease), Headache, Hypertension, and Pneumonia. Surgical: [name]  has a past surgical history that includes Cholecystectomy; Appendectomy; Tonsillectomy; Abdominal hysterectomy;  Lymph gland excision (Right, 01/22/2020); Portacath placement (N/A, 02/24/2020); Breast biopsy (Right, 04/28/2021); Dilation and curettage of uterus; Excision of breast biopsy (Right, 05/19/2021); and Laparoscopic bilateral salpingo oophorectomy (Bilateral, 07/06/2021). Family: family history includes Breast cancer in her mother; Cancer in her mother.  Laboratory Chemistry Profile   Renal Lab Results  Component Value Date   BUN 18 01/05/2022   CREATININE 1.30 (H) 01/05/2022   BCR 15 05/09/2015   GFRAA >60 05/03/2020   GFRNONAA 46 (L) 01/05/2022    Hepatic Lab Results  Component Value Date   AST 47 (H) 01/05/2022   ALT 38 01/05/2022   ALBUMIN 4.0 01/05/2022   ALKPHOS 102 01/05/2022   HCVAB NON REACTIVE 01/11/2020    Electrolytes Lab Results  Component Value Date   NA 139 01/05/2022   K 3.8 01/05/2022   CL 107 01/05/2022   CALCIUM 8.7 (L) 01/05/2022    Bone No results found for: "VD25OH", "VD125OH2TOT", "YY5035WS5", "KC1275TZ0", "25OHVITD1", "25OHVITD2", "25OHVITD3", "TESTOFREE", "TESTOSTERONE"  Inflammation (CRP: Acute Phase) (ESR: Chronic Phase) No results found for: "CRP", "ESRSEDRATE", "LATICACIDVEN"       Note: Above Lab results reviewed.  Recent Imaging Review  MM 3D SCREEN BREAST BILATERAL CLINICAL DATA:  Screening.  EXAM: DIGITAL SCREENING BILATERAL MAMMOGRAM WITH TOMOSYNTHESIS AND CAD  TECHNIQUE: Bilateral screening digital craniocaudal and mediolateral oblique mammograms were obtained. Bilateral screening digital breast tomosynthesis was performed. The images were evaluated with  computer-aided detection.  COMPARISON:  Previous exam(s).  ACR Breast Density Category b: There are scattered areas of fibroglandular density.  FINDINGS: There are no findings suspicious for malignancy.  IMPRESSION: No mammographic evidence of malignancy. A result letter of this screening mammogram will be mailed directly to the patient.  RECOMMENDATION: Screening  mammogram in one year. (Code:SM-B-01Y)  BI-RADS CATEGORY  1: Negative.  Electronically Signed   By: Ammie Ferrier M.D.   On: 04/27/2022 11:57   Lumbosacral Imaging: Lumbar MR wo contrast: Results for orders placed during the hospital encounter of 11/06/21   MR LUMBAR SPINE WO CONTRAST   Narrative CLINICAL DATA:  Acute midline low back pain without sciatica M54.50 (ICD-10-CM).   EXAM: MRI LUMBAR SPINE WITHOUT CONTRAST   TECHNIQUE: Multiplanar, multisequence MR imaging of the lumbar spine was performed. No intravenous contrast was administered.   COMPARISON:  None.   FINDINGS: Segmentation:  Standard.   Alignment:  Physiologic.   Vertebrae: No fracture, evidence of discitis, or aggressive bone lesion. Hemangioma in the L1 vertebral body.   Conus medullaris and cauda equina: Conus extends to the T12-L1 level. Conus and cauda equina appear normal.   Paraspinal and other soft tissues: Bilateral renal cysts.   Disc levels:   T12-L1: Tiny central disc protrusion. No significant spinal canal or neural foraminal stenosis.   L1-2: No spinal canal or neural foraminal stenosis.   L2-3: Tiny right foraminal disc protrusion and mild facet degenerative changes. No significant spinal canal or neural foraminal stenosis.   L3-4: Shallow disc bulge and mild facet degenerative changes without significant spinal canal or neural foraminal stenosis.   L4-5: Shallow disc bulge and mild facet degenerative changes without significant spinal canal or neural foraminal stenosis.   L5-S1: Disc bulge with right foraminal/far lateral disc osteophyte complex and mild facet degenerative changes resulting in moderate right neural foraminal narrowing, impinging on the exiting right L5 nerve root. No significant spinal canal stenosis.   IMPRESSION: Mild degenerative changes of the lumbar spine, more pronounced at L5-S1 where there is moderate right neural foraminal  narrowing, impinging on the exiting right L5 nerve root.     Electronically Signed By: Pedro Earls M.D. On: 11/06/2021 09:14     Complexity Note: Imaging results reviewed.                          Note: Reviewed        Physical Exam  General appearance: Well nourished, well developed, and well hydrated. In no apparent acute distress Mental status: Alert, oriented x 3 (person, place, & time)       Respiratory: No evidence of acute respiratory distress Eyes: PERLA Vitals: BP 139/85   Pulse 84   Temp (!) 97.2 F (36.2 C)   Ht _0  (1.6 m)   Wt 200 lb (90.7 kg)   SpO2 98%   BMI 35.43 kg/m  BMI: Estimated body mass index is 35.43 kg/m as calculated from the following:   Height as of this encounter: _1  (1.6 m).   Weight as of this encounter: 200 lb (90.7 kg). Ideal: Ideal body weight: 52.4 kg (115 lb 8.3 oz) Adjusted ideal body weight: 67.7 kg (149 lb 5 oz)    Lumbar Spine Area Exam  Skin & Axial Inspection: No masses, redness, or swelling Alignment: Symmetrical Functional ROM: Pain restricted ROM       Stability: No instability detected Muscle Tone/Strength: Functionally intact. No obvious  neuro-muscular anomalies detected. Sensory (Neurological): Dermatomal pain pattern and facet mediated Palpation: No palpable anomalies         Gait & Posture Assessment  Ambulation: Unassisted Gait: Relatively normal for age and body habitus Posture: WNL  Lower Extremity Exam      Side: Right lower extremity   Side: Left lower extremity  Stability: No instability observed           Stability: No instability observed          Skin & Extremity Inspection: Skin color, temperature, and hair growth are WNL. No peripheral edema or cyanosis. No masses, redness, swelling, asymmetry, or associated skin lesions. No contractures.   Skin & Extremity Inspection: Skin color, temperature, and hair growth are WNL. No peripheral edema or cyanosis. No masses, redness, swelling,  asymmetry, or associated skin lesions. No contractures.  Functional ROM: Pain restricted ROM for hip and knee joints           Functional ROM: Pain restricted ROM for hip and knee joints          Muscle Tone/Strength: Functionally intact. No obvious neuro-muscular anomalies detected.   Muscle Tone/Strength: Functionally intact. No obvious neuro-muscular anomalies detected.  Sensory (Neurological): Neurogenic pain pattern         Sensory (Neurological): Neurogenic pain pattern        DTR: Patellar: deferred today Achilles: deferred today Plantar: deferred today   DTR: Patellar: deferred today Achilles: deferred today Plantar: deferred today  Palpation: No palpable anomalies   Palpation: No palpable anomalies    5 out of 5 strength bilateral lower extremity: Plantar flexion, dorsiflexion, knee flexion, knee extension.    Assessment   Diagnosis Status  1. Lumbar facet arthropathy   2. Lumbar spondylosis   3. Lumbar degenerative disc disease   4. Chronic pain syndrome    Controlled Controlled Controlled   Updated Problems: Problem  Lumbar Spondylosis  Lumbar disc herniation with radiculopathy (L5/S1)  Lumbar Degenerative Disc Disease  Chronic Pain Syndrome    Plan of Care   1. Lumbar facet arthropathy - Implantable Peripheral Nerve Stimulator; Future - Implantable Peripheral Nerve Stimulator; Future  2. Lumbar spondylosis - Implantable Peripheral Nerve Stimulator; Future - Implantable Peripheral Nerve Stimulator; Future  3. Lumbar degenerative disc disease  4. Chronic pain syndrome - Implantable Peripheral Nerve Stimulator; Future - Implantable Peripheral Nerve Stimulator; Future  Had a long discussion with patient regarding available therapies which include peripheral nerve stimulation of lumbar medial branch.  Discussed procedure and technology with patient.  Answered all of her questions.  She would like to proceed with Sprint L4 medial branch peripheral nerve  stimulation.  Pharmacotherapy (Medications Ordered): Meds ordered this encounter  Medications   tiZANidine (ZANAFLEX) 4 MG tablet    Sig: Take 1 tablet (4 mg total) by mouth every 12 (twelve) hours as needed for muscle spasms.    Dispense:  60 tablet    Refill:  1    Do not place this medication, or any other prescription from our practice, on "Automatic Refill". Patient may have prescription filled one day early if pharmacy is closed on scheduled refill date.   Orders:  Orders Placed This Encounter  Procedures   Implantable Peripheral Nerve Stimulator    Standing Status:   Future    Standing Expiration Date:   08/08/2022    Scheduling Instructions:     Procedure: Temporary implantable peripheral nerve stimulator     Side:  LEFT     Nerve site:  L4     Timeframe: ASAA    Order Specific Question:   Location of Procedure    Answer:   [facility]   Implantable Peripheral Nerve Stimulator    Standing Status:   Future    Standing Expiration Date:   08/08/2022    Scheduling Instructions:     Procedure: Temporary implantable peripheral nerve stimulator     Side:  RIGHT 2 weeks after left     Nerve site: L4     Timeframe: ASAA    Order Specific Question:   Location of Procedure    Answer:   [facility]   Follow-up plan:   Return in about 2 weeks (around 05/23/2022) for Left Sprint PNS , in clinic NS.     Left L5/S1 ESI 04/11/22    Recent Visits Date Type Provider Dept  04/11/22 Procedure visit Danielle Santa, MD [facility]  04/02/22 Office Visit Danielle Santa, MD [facility]  Showing recent visits within past 90 days and meeting all other requirements Today's Visits Date Type Provider Dept  05/09/22 Office Visit Danielle Santa, MD [facility]  Showing today's visits and meeting all other requirements Future Appointments No visits were found meeting these conditions. Showing future appointments within next 90 days and  meeting all other requirements  I discussed the assessment and treatment plan with the patient. The patient was provided an opportunity to ask questions and all were answered. The patient agreed with the plan and demonstrated an understanding of the instructions.  Patient advised to call back or seek an in-person evaluation if the symptoms or condition worsens.  Duration of encounter: 57mnutes.  Total time on encounter, as per AMA guidelines included both the face-to-face and non-face-to-face time personally spent by the physician and/or other qualified health care professional(s) on the day of the encounter (includes time in activities that require the physician or other qualified health care professional and does not include time in activities normally performed by clinical staff). Physician's time may include the following activities when performed: preparing to see the patient (eg, review of tests, pre-charting review of records) obtaining and/or reviewing separately obtained history performing a medically appropriate examination and/or evaluation counseling and educating the patient/family/caregiver ordering medications, tests, or procedures referring and communicating with other health care professionals (when not separately reported) documenting clinical information in the electronic or other health record independently interpreting results (not separately reported) and communicating results to the patient/ family/caregiver care coordination (not separately reported)  Note by: BGillis Santa MD Date: 05/09/2022; Time: 3:07 PM

## 2022-05-09 NOTE — Progress Notes (Signed)
Pt was given information on sprint implant and chlorhexidine gluconate to use at night before and the next morning.

## 2022-05-14 DIAGNOSIS — R002 Palpitations: Secondary | ICD-10-CM | POA: Insufficient documentation

## 2022-05-21 ENCOUNTER — Ambulatory Visit
Admission: RE | Admit: 2022-05-21 | Discharge: 2022-05-21 | Disposition: A | Discharge status: Home / Self Care | Payer: Medicare Other | Source: Ambulatory Visit | Attending: Student in an Organized Health Care Education/Training Program | Admitting: Student in an Organized Health Care Education/Training Program

## 2022-05-21 ENCOUNTER — Ambulatory Visit
Payer: Medicare Other | Attending: Student in an Organized Health Care Education/Training Program | Admitting: Student in an Organized Health Care Education/Training Program

## 2022-05-21 ENCOUNTER — Encounter: Payer: Self-pay | Admitting: Student in an Organized Health Care Education/Training Program

## 2022-05-21 DIAGNOSIS — M47816 Spondylosis without myelopathy or radiculopathy, lumbar region: Secondary | ICD-10-CM | POA: Diagnosis not present

## 2022-05-21 DIAGNOSIS — G894 Chronic pain syndrome: Secondary | ICD-10-CM | POA: Insufficient documentation

## 2022-05-21 MED ORDER — LIDOCAINE HCL 2 % IJ SOLN
INTRAMUSCULAR | Status: AC
  Filled 2022-05-21: qty 20

## 2022-05-21 MED ORDER — ROPIVACAINE HCL 2 MG/ML IJ SOLN
INTRAMUSCULAR | Status: AC
  Filled 2022-05-21: qty 20

## 2022-05-21 MED ORDER — ROPIVACAINE HCL 2 MG/ML IJ SOLN
9.0000 mL | Freq: Once | INTRAMUSCULAR | Status: AC
  Administered 2022-05-21: 9 mL via PERINEURAL

## 2022-05-21 MED ORDER — LIDOCAINE HCL 2 % IJ SOLN
20.0000 mL | Freq: Once | INTRAMUSCULAR | Status: AC
  Administered 2022-05-21: 400 mg

## 2022-05-21 NOTE — Progress Notes (Signed)
PROVIDER NOTE: Interpretation of information contained herein should be left to medically-trained personnel. Specific patient instructions are provided elsewhere under "Patient Instructions" section of medical record. This document was created in part using STT-dictation technology, any transcriptional errors that may result from this process are unintentional.  Patient: Danielle Lewis Type: Established DOB: 09/12/1955 MRN: [id number] PCP: Peggye Form, NP  Service: Procedure DOS: 05/21/2022 Setting: Ambulatory Location: Ambulatory outpatient facility Delivery: Face-to-face Provider: Gillis Santa, MD Specialty: Interventional Pain Management Specialty designation: 09 Location: Outpatient facility Ref. Prov.: Gillis Santa, MD    Primary Reason for Admission: Surgical management of chronic pain condition.  Procedure:  Anesthesia, Analgesia, Anxiolysis:  Type: SPRINTTM Peripheral Nerve Field Stimulator (PNS) MicroLeadTM Implant Purpose: Therapeutic Region: Lumbar Level: L4-5 Facet Medial Branch Nerve Laterality: Left           Anesthesia: Local (1-2% Lidocaine)  Anxiolysis: None  Sedation: None  Guidance: Fluoroscopy & [place] CPT (37106)    1. Lumbar facet arthropathy   2. Lumbar spondylosis   3. Chronic pain syndrome    NAS-11 Pain score:   Pre-procedure: 7 /10   Post-procedure: 0-No pain/10   Pre-op H&P Assessment:  Danielle Lewis is a 66 y.o. (year old), female patient, seen today for interventional treatment. She  has a past surgical history that includes Cholecystectomy; Appendectomy; Tonsillectomy; Abdominal hysterectomy; Lymph gland excision (Right, 01/22/2020); Portacath placement (N/A, 02/24/2020); Breast biopsy (Right, 04/28/2021); Dilation and curettage of uterus; Excision of breast biopsy (Right, 05/19/2021); and Laparoscopic bilateral salpingo oophorectomy (Bilateral, 07/06/2021).  Initial Vital Signs:  Pulse/EKG Rate: 60ECG Heart Rate: 60 Temp: (!) 97.3 F (36.3 C) Resp:  16 BP: (!) 144/76 SpO2: 94 %  BMI: Estimated body mass index is 35.48 kg/m as calculated from the following:   Height as of this encounter: '5\' 2"'$  (1.575 m).   Weight as of this encounter: 194 lb (88 kg).  Risk Assessment: Allergies: Reviewed. She is allergic to penicillins.  Allergy Precautions: None required Coagulopathies: Reviewed. None identified.  Blood-thinner therapy: None at this time Active Infection(s): Reviewed. None identified. Danielle Lewis is afebrile  Site Confirmation: Danielle Lewis was asked to confirm the procedure and laterality before marking the site, which she did. Procedure checklist: Completed Consent: Before the procedure and under the influence of no sedative(s), amnesic(s), or anxiolytics, the patient was informed of the treatment options, risks and possible complications. To fulfill our ethical and legal obligations, as recommended by the American Medical Association's Code of Ethics, I have informed the patient of my clinical impression; the nature and purpose of the treatment or procedure; the risks, benefits, and possible complications of the intervention; the alternatives, including doing nothing; the risk(s) and benefit(s) of the alternative treatment(s) or procedure(s); and the risk(s) and benefit(s) of doing nothing.  Danielle Lewis was provided with information about the general risks and possible complications associated with most interventional procedures. These include, but are not limited to: failure to achieve desired goals, infection, bleeding, organ or nerve damage, allergic reactions, paralysis, and/or death.  In addition, she was informed of those risks and possible complications associated to this particular procedure, which include, but are not limited to: damage to the implant; failure to decrease pain; local, systemic, or serious CNS infections, intraspinal abscess with possible cord compression and paralysis, or life-threatening such as meningitis; intrathecal  and/or epidural bleeding with formation of hematoma with possible spinal cord compression and permanent paralysis; organ damage; nerve injury or damage with subsequent sensory, motor, and/or autonomic system dysfunction, resulting  in transient or permanent pain, numbness, and/or weakness of one or several areas of the body; allergic reactions, either minor or major life-threatening, such as anaphylactic or anaphylactoid reactions.  Furthermore, Danielle Lewis was informed of those risks and complications associated with the medications. These include, but are not limited to: allergic reactions (i.e.: anaphylactic or anaphylactoid reactions); arrhythmia;  Hypotension/hypertension; cardiovascular collapse; respiratory depression and/or shortness of breath; swelling or edema; medication-induced neural toxicity; particulate matter embolism and blood vessel occlusion with resultant organ, and/or nervous system infarction and permanent paralysis.  Finally, she was informed that Medicine is not an exact science; therefore, there is also the possibility of unforeseen or unpredictable risks and/or possible complications that may result in a catastrophic outcome. The patient indicated having understood very clearly. We have given the patient no guarantees and we have made no promises. Enough time was given to the patient to ask questions, all of which were answered to the patient's satisfaction. Danielle Lewis has indicated that she wanted to continue with the procedure. Attestation: I, the ordering provider, attest that I have discussed with the patient the benefits, risks, side-effects, alternatives, likelihood of achieving goals, and potential problems during recovery for the procedure that I have provided informed consent. Date  Time: 05/21/2022  7:50 AM  Pre-Procedure Preparation:  Monitoring: As per clinic protocol. Respiration, ETCO2, SpO2, BP, heart rate and rhythm monitor placed and checked for adequate function Safety  Precautions: Patient was assessed for positional comfort and pressure points before starting the procedure. Time-out: I initiated and conducted the "Time-out" before starting the procedure, as per protocol. The patient was asked to participate by confirming the accuracy of the "Time Out" information. Verification of the correct person, site, and procedure were performed and confirmed by me, the nursing staff, and the patient. "Time-out" conducted as per Joint Commission's Universal Protocol (UP.01.01.01). Time: 0827  Description of Procedure Process:   Position: Prone Target Area: <1 cm from targeted nerve (Medial Branch Nerve) Approach: Posterior percutaneous, paramedial, interlaminar approach Area Prepped: Entire Lumbar Region Prepping solution: ChloraPrep (2% chlorhexidine gluconate and 70% isopropyl alcohol) Safety Precautions: Safe injection practices and needle disposal techniques used. Medications properly checked for expiration dates. SDV (single dose vial) medications used. Aspiration looking for blood return and/or CSF was conducted prior to all injections. At no point did I inject any substances, as a needle was being advanced. No attempts were made at seeking any paresthesias.  Description of the Procedure (Lumbar Medial Branch):  Availability of a responsible, adult driver, and NPO status confirmed. Informed consent was obtained after having discussed risks and possible complications. An IV was started. The patient was then taken to the fluoroscopy suite, where the patient was placed in position for the procedure, over the fluoroscopy table. The patient was then monitored in the usual manner. Fluoroscopy was manipulated to obtain the best possible view of the target. Parallex error was corrected before commencing the procedure. Once a clear view of the target had been obtained, the skin and subcutaneous tissue over the procedure site were infiltrated using lidocaine, loaded in a 10 cc  luer-loc syringe with a 0.5 inch, 25-G needle. Care was taken to avoid numbing deeper tissues The introducer needle(s) was/were then inserted through the skin and deeper tissues, specifically the multifidus muscle.  An introducer needle and stimulating probe were  assembled, inserted and advanced along the intended course of the medial branch nerve as it traverses the lamina medial and inferior to the zygapophyseal joint, taking care to  maintain the proper depth of insertion as the introducer is advanced under fluoroscopic. The introducer needle was delivered to a location in proximity to the nerve. Multiple stimulation parameters were used to deliver stimulation to the medial branch nerve in concert with stimulating at multiple positions around the nerve. Nerve target acquisition was confirmed noting generation of paresthesias in the paravertebral regions corresponding to the level being stimulated as well as rhythmic thumping within the multifidi, the latter being further corroborated via palpation.  Various electrical parameter combinations were tested, and the lead location was adjusted (physically relocated) until the patient indicated paresthesia or muscle tension overlapping the distribution of the patient's typical region of pain.  The stimulating probe was removed from the introducer and a percutaneous lead was guided through the needle and delivered to a location in similar proximity to the nerve. Final  location was verified with electrical stimulation and documented with fluoroscopy & ultrasound.  The introducer needle was removed, and the exposed end of the percutaneous lead was attached to an external stimulator unit. Various electrical parameter combinations were again  tested until the patient indicated paresthesia or muscle tension  overlapping the distribution of the patient's typical region of pain.  After confirming that lead impedance was in the normal range, the external unit was  detached, the needle was removed, and the lead was anchored at the skin. The lead was threaded into the connector block and electrical continuity and desired patient response was confirmed. The connector block was attached to the external  stimulator unit.The site was covered with a sterile occlusive dressing and  a fluoroscopic and ultrasound image was taken to document final placement. The patient was observed for stability of vital signs and comfort.  Vitals:   05/21/22 0828 05/21/22 0832 05/21/22 0837 05/21/22 0840  BP: (!) 138/93 138/89 (!) 128/96 131/86  Pulse:      Resp: (!) '22 20 14 18  '$ Temp:      TempSrc:      SpO2: 96% 96% 97% 96%  Weight:      Height:       Start Time: 0827 hrs. End Time: 0839 hrs.  Imaging Guidance (Spinal):          Type of Imaging Technique: Fluoroscopy Guidance (Spinal) and ultrasound guidance to confirm multifidus activation after micro lead placement Indication(s): Assistance in needle guidance and placement for procedures requiring needle placement in or near specific anatomical locations not easily accessible without such assistance. Exposure Time: Please see nurses notes. Contrast: None used. Fluoroscopic Guidance: I was personally present during the use of fluoroscopy. "Tunnel Vision Technique" used to obtain the best possible view of the target area. Parallax error corrected before commencing the procedure. "Direction-depth-direction" technique used to introduce the needle under continuous pulsed fluoroscopy. Once target was reached, antero-posterior, oblique, and lateral fluoroscopic projection used confirm needle placement in all planes. Images permanently stored in EMR. Interpretation: No contrast injected. I personally interpreted the imaging intraoperatively. Adequate needle placement confirmed in multiple planes. Permanent images saved into the patient's record.  Antibiotic Prophylaxis:   Anti-infectives (From admission, onward)    None       Indication(s): None identified  Post-operative Assessment:  Post-procedure Vital Signs:  Pulse/HCG Rate: 6060 Temp: (!) 97.3 F (36.3 C) Resp: 18 BP: 131/86 SpO2: 96 %  Complications: No immediate post-treatment complications observed by team, or reported by patient.  Note: The patient tolerated the entire procedure well. A repeat set of vitals were taken after the procedure  and the patient was kept under observation following institutional policy, for this type of procedure. Post-procedural neurological assessment was performed, showing return to baseline, prior to discharge. The patient was provided with post-procedure discharge instructions, including a section on how to identify potential problems. Should any problems arise concerning this procedure, the patient was given instructions to immediately contact us, at any time, without hesitation. In any case, we plan to contact the patient by telephone for a follow-up status report regarding this interventional procedure.  Comments:  No additional relevant information.  Plan of Care  Orders:  Orders Placed This Encounter  Procedures   DG PAIN CLIN[facility] C-ARM 1-60 MIN NO REPORT    Intraoperative interpretation by procedural physician at [facility].    Standing Status:   Standing    Number of Occurrences:   1    Order Specific Question:   Reason for exam:    Answer:   Assistance in needle guidance and placement for procedures requiring needle placement in or near specific anatomical locations not easily accessible without such assistance.     Medications administered: We administered lidocaine and ropivacaine (PF) 2 mg/mL (0.2%).  See the medical record for exact dosing, route, and time of administration.  Follow-up plan:   Return in about 2 weeks (around 06/04/2022) for R L4 PNS.     Left L5/S1 ESI 04/11/22, Left L4 PNS 05/21/22    Recent Visits Date Type Provider Dept  05/09/22 Office Visit Gillis Santa, MD  [facility]  04/11/22 Procedure visit Gillis Santa, MD [facility]  04/02/22 Office Visit Gillis Santa, MD [facility]  Showing recent visits within past 90 days and meeting all other requirements Today's Visits Date Type Provider Dept  05/21/22 Procedure visit Gillis Santa, MD [facility]  Showing today's visits and meeting all other requirements Future Appointments Date Type Provider Dept  06/27/22 Appointment Gillis Santa, MD [facility]  Showing future appointments within next 90 days and meeting all other requirements  Disposition: Discharge home  Discharge (Date  Time): 05/21/2022; 0847 (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   Primary Care Physician: Peggye Form, NP Location: [facility] Note by: Gillis Santa, MD Date: 05/21/2022; Time: 8:53 AM

## 2022-05-21 NOTE — Patient Instructions (Signed)

## 2022-05-22 ENCOUNTER — Telehealth: Payer: Self-pay | Admitting: *Deleted

## 2022-05-22 NOTE — Telephone Encounter (Signed)
Post procedure call;  patient states she is sore.  Educated to use ice or heat which ever is tolerated.  She had questions about programming the device.  I told her that we have no knowledge of programming the device and if she doesn't hear from her representative she can reach out to them for advice.  Patient verbalizes u/o information.

## 2022-06-27 ENCOUNTER — Ambulatory Visit
Admission: RE | Admit: 2022-06-27 | Discharge: 2022-06-27 | Disposition: A | Discharge status: Home / Self Care | Payer: Medicare Other | Source: Ambulatory Visit | Attending: Student in an Organized Health Care Education/Training Program | Admitting: Student in an Organized Health Care Education/Training Program

## 2022-06-27 ENCOUNTER — Ambulatory Visit
Payer: Medicare Other | Attending: Student in an Organized Health Care Education/Training Program | Admitting: Student in an Organized Health Care Education/Training Program

## 2022-06-27 ENCOUNTER — Encounter: Payer: Self-pay | Admitting: Student in an Organized Health Care Education/Training Program

## 2022-06-27 DIAGNOSIS — M47816 Spondylosis without myelopathy or radiculopathy, lumbar region: Secondary | ICD-10-CM | POA: Insufficient documentation

## 2022-06-27 DIAGNOSIS — G894 Chronic pain syndrome: Secondary | ICD-10-CM | POA: Insufficient documentation

## 2022-06-27 MED ORDER — LIDOCAINE HCL 2 % IJ SOLN
INTRAMUSCULAR | Status: AC
  Filled 2022-06-27: qty 20

## 2022-06-27 MED ORDER — LIDOCAINE HCL 2 % IJ SOLN
20.0000 mL | Freq: Once | INTRAMUSCULAR | Status: AC
  Administered 2022-06-27: 400 mg

## 2022-06-27 MED ORDER — ROPIVACAINE HCL 2 MG/ML IJ SOLN
9.0000 mL | Freq: Once | INTRAMUSCULAR | Status: AC
  Administered 2022-06-27: 9 mL via PERINEURAL
  Filled 2022-06-27: qty 20

## 2022-06-27 NOTE — Patient Instructions (Signed)

## 2022-06-27 NOTE — Progress Notes (Signed)
PROVIDER NOTE: Interpretation of information contained herein should be left to medically-trained personnel. Specific patient instructions are provided elsewhere under "Patient Instructions" section of medical record. This document was created in part using STT-dictation technology, any transcriptional errors that may result from this process are unintentional.  Patient: Danielle Lewis Type: Established DOB: Feb 14, 1956 MRN: [id number] PCP: Peggye Form, NP  Service: Procedure DOS: 06/27/2022 Setting: Ambulatory Location: Ambulatory outpatient facility Delivery: Face-to-face Provider: Gillis Santa, MD Specialty: Interventional Pain Management Specialty designation: 09 Location: Outpatient facility Ref. Prov.: Gillis Santa, MD    Primary Reason for Admission: Surgical management of chronic pain condition.  Procedure:  Anesthesia, Analgesia, Anxiolysis:  Type: SPRINTTM Peripheral Nerve Field Stimulator (PNS) MicroLeadTM Implant Purpose: Therapeutic Region: Lumbar Level: L4-5 Facet Medial Branch Nerve Laterality: Right           Anesthesia: Local (1-2% Lidocaine)  Anxiolysis: None  Sedation: None  Guidance: Fluoroscopy & [place] CPT (93716)    1. Lumbar facet arthropathy   2. Lumbar spondylosis   3. Chronic pain syndrome    NAS-11 Pain score:   Pre-procedure: 6 /10   Post-procedure: 0-No pain/10   Pre-op H&P Assessment:  Ms. Manner is a 66 y.o. (year old), female patient, seen today for interventional treatment. She  has a past surgical history that includes Cholecystectomy; Appendectomy; Tonsillectomy; Abdominal hysterectomy; Lymph gland excision (Right, 01/22/2020); Portacath placement (N/A, 02/24/2020); Breast biopsy (Right, 04/28/2021); Dilation and curettage of uterus; Excision of breast biopsy (Right, 05/19/2021); and Laparoscopic bilateral salpingo oophorectomy (Bilateral, 07/06/2021).  Initial Vital Signs:  Pulse/EKG Rate: 66ECG Heart Rate: 66 Temp: (!) 97 F (36.1 C) Resp:  18 BP: (!) 153/82 SpO2: 97 %  BMI: Estimated body mass index is 33.3 kg/m as calculated from the following:   Height as of this encounter: '5\' 4"'$  (1.626 m).   Weight as of this encounter: 194 lb (88 kg).  Risk Assessment: Allergies: Reviewed. She is allergic to penicillins.  Allergy Precautions: None required Coagulopathies: Reviewed. None identified.  Blood-thinner therapy: None at this time Active Infection(s): Reviewed. None identified. Ms. Radebaugh is afebrile  Site Confirmation: Ms. Crocket was asked to confirm the procedure and laterality before marking the site, which she did. Procedure checklist: Completed Consent: Before the procedure and under the influence of no sedative(s), amnesic(s), or anxiolytics, the patient was informed of the treatment options, risks and possible complications. To fulfill our ethical and legal obligations, as recommended by the American Medical Association's Code of Ethics, I have informed the patient of my clinical impression; the nature and purpose of the treatment or procedure; the risks, benefits, and possible complications of the intervention; the alternatives, including doing nothing; the risk(s) and benefit(s) of the alternative treatment(s) or procedure(s); and the risk(s) and benefit(s) of doing nothing.  Ms. Szuch was provided with information about the general risks and possible complications associated with most interventional procedures. These include, but are not limited to: failure to achieve desired goals, infection, bleeding, organ or nerve damage, allergic reactions, paralysis, and/or death.  In addition, she was informed of those risks and possible complications associated to this particular procedure, which include, but are not limited to: damage to the implant; failure to decrease pain; local, systemic, or serious CNS infections, intraspinal abscess with possible cord compression and paralysis, or life-threatening such as meningitis; intrathecal  and/or epidural bleeding with formation of hematoma with possible spinal cord compression and permanent paralysis; organ damage; nerve injury or damage with subsequent sensory, motor, and/or autonomic system dysfunction, resulting  in transient or permanent pain, numbness, and/or weakness of one or several areas of the body; allergic reactions, either minor or major life-threatening, such as anaphylactic or anaphylactoid reactions.  Furthermore, Ms. Yott was informed of those risks and complications associated with the medications. These include, but are not limited to: allergic reactions (i.e.: anaphylactic or anaphylactoid reactions); arrhythmia;  Hypotension/hypertension; cardiovascular collapse; respiratory depression and/or shortness of breath; swelling or edema; medication-induced neural toxicity; particulate matter embolism and blood vessel occlusion with resultant organ, and/or nervous system infarction and permanent paralysis.  Finally, she was informed that Medicine is not an exact science; therefore, there is also the possibility of unforeseen or unpredictable risks and/or possible complications that may result in a catastrophic outcome. The patient indicated having understood very clearly. We have given the patient no guarantees and we have made no promises. Enough time was given to the patient to ask questions, all of which were answered to the patient's satisfaction. Ms. Wanat has indicated that she wanted to continue with the procedure. Attestation: I, the ordering provider, attest that I have discussed with the patient the benefits, risks, side-effects, alternatives, likelihood of achieving goals, and potential problems during recovery for the procedure that I have provided informed consent. Date  Time: 06/27/2022  7:50 AM  Pre-Procedure Preparation:  Monitoring: As per clinic protocol. Respiration, ETCO2, SpO2, BP, heart rate and rhythm monitor placed and checked for adequate function Safety  Precautions: Patient was assessed for positional comfort and pressure points before starting the procedure. Time-out: I initiated and conducted the "Time-out" before starting the procedure, as per protocol. The patient was asked to participate by confirming the accuracy of the "Time Out" information. Verification of the correct person, site, and procedure were performed and confirmed by me, the nursing staff, and the patient. "Time-out" conducted as per Joint Commission's Universal Protocol (UP.01.01.01). Time: 0837  Description of Procedure Process:   Position: Prone Target Area: <1 cm from targeted nerve (Medial Branch Nerve) Approach: Posterior percutaneous, paramedial, interlaminar approach Area Prepped: Entire Lumbar Region Prepping solution: ChloraPrep (2% chlorhexidine gluconate and 70% isopropyl alcohol) Safety Precautions: Safe injection practices and needle disposal techniques used. Medications properly checked for expiration dates. SDV (single dose vial) medications used. Aspiration looking for blood return and/or CSF was conducted prior to all injections. At no point did I inject any substances, as a needle was being advanced. No attempts were made at seeking any paresthesias.  Description of the Procedure (Lumbar Medial Branch):  Availability of a responsible, adult driver, and NPO status confirmed. Informed consent was obtained after having discussed risks and possible complications. An IV was started. The patient was then taken to the fluoroscopy suite, where the patient was placed in position for the procedure, over the fluoroscopy table. The patient was then monitored in the usual manner. Fluoroscopy was manipulated to obtain the best possible view of the target. Parallex error was corrected before commencing the procedure. Once a clear view of the target had been obtained, the skin and subcutaneous tissue over the procedure site were infiltrated using lidocaine, loaded in a 10 cc  luer-loc syringe with a 0.5 inch, 25-G needle. Care was taken to avoid numbing deeper tissues The introducer needle(s) was/were then inserted through the skin and deeper tissues, specifically the multifidus muscle.  An introducer needle and stimulating probe were  assembled, inserted and advanced along the intended course of the medial branch nerve as it traverses the lamina medial and inferior to the zygapophyseal joint, taking care to  maintain the proper depth of insertion as the introducer is advanced under fluoroscopic. The introducer needle was delivered to a location in proximity to the nerve. Multiple stimulation parameters were used to deliver stimulation to the medial branch nerve in concert with stimulating at multiple positions around the nerve. Nerve target acquisition was confirmed noting generation of paresthesias in the paravertebral regions corresponding to the level being stimulated as well as rhythmic thumping within the multifidi, the latter being further corroborated via palpation.  Various electrical parameter combinations were tested, and the lead location was adjusted (physically relocated) until the patient indicated paresthesia or muscle tension overlapping the distribution of the patient's typical region of pain.  The stimulating probe was removed from the introducer and a percutaneous lead was guided through the needle and delivered to a location in similar proximity to the nerve. Final  location was verified with electrical stimulation and documented with fluoroscopy & ultrasound.  The introducer needle was removed, and the exposed end of the percutaneous lead was attached to an external stimulator unit. Various electrical parameter combinations were again  tested until the patient indicated paresthesia or muscle tension  overlapping the distribution of the patient's typical region of pain.  After confirming that lead impedance was in the normal range, the external unit was  detached, the needle was removed, and the lead was anchored at the skin. The lead was threaded into the connector block and electrical continuity and desired patient response was confirmed. The connector block was attached to the external  stimulator unit.The site was covered with a sterile occlusive dressing and  a fluoroscopic and ultrasound image was taken to document final placement. The patient was observed for stability of vital signs and comfort.  Vitals:   06/27/22 0837 06/27/22 0843 06/27/22 0847 06/27/22 0855  BP: 133/82 130/85 131/85 (!) 136/90  Pulse:      Resp: (!) '21 20 20 16  '$ Temp:      SpO2: 95% 97% 96% 96%  Weight:      Height:       Start Time: 0837 hrs. End Time: 0847 hrs.  Imaging Guidance (Spinal):          Type of Imaging Technique: Fluoroscopy Guidance (Spinal) and ultrasound guidance to confirm multifidus activation after micro lead placement Indication(s): Assistance in needle guidance and placement for procedures requiring needle placement in or near specific anatomical locations not easily accessible without such assistance. Exposure Time: Please see nurses notes. Contrast: None used. Fluoroscopic Guidance: I was personally present during the use of fluoroscopy. "Tunnel Vision Technique" used to obtain the best possible view of the target area. Parallax error corrected before commencing the procedure. "Direction-depth-direction" technique used to introduce the needle under continuous pulsed fluoroscopy. Once target was reached, antero-posterior, oblique, and lateral fluoroscopic projection used confirm needle placement in all planes. Images permanently stored in EMR. Interpretation: No contrast injected. I personally interpreted the imaging intraoperatively. Adequate needle placement confirmed in multiple planes. Permanent images saved into the patient's record.  Antibiotic Prophylaxis:   Anti-infectives (From admission, onward)    None      Indication(s):  None identified  Post-operative Assessment:  Post-procedure Vital Signs:  Pulse/HCG Rate: 6667 Temp: (!) 97 F (36.1 C) Resp: 16 BP: (!) 136/90 SpO2: 96 %  Complications: No immediate post-treatment complications observed by team, or reported by patient.  Note: The patient tolerated the entire procedure well. A repeat set of vitals were taken after the procedure and the patient was kept under  observation following institutional policy, for this type of procedure. Post-procedural neurological assessment was performed, showing return to baseline, prior to discharge. The patient was provided with post-procedure discharge instructions, including a section on how to identify potential problems. Should any problems arise concerning this procedure, the patient was given instructions to immediately contact us, at any time, without hesitation. In any case, we plan to contact the patient by telephone for a follow-up status report regarding this interventional procedure.  Comments:  No additional relevant information.  Plan of Care  Orders:  Orders Placed This Encounter  Procedures   DG PAIN CLINIC C-ARM 1-60 MIN NO REPORT    Intraoperative interpretation by procedural physician at [facility].    Standing Status:   Standing    Number of Occurrences:   1    Order Specific Question:   Reason for exam:    Answer:   Assistance in needle guidance and placement for procedures requiring needle placement in or near specific anatomical locations not easily accessible without such assistance.     Medications administered: We administered lidocaine and ropivacaine (PF) 2 mg/mL (0.2%).  See the medical record for exact dosing, route, and time of administration.  Follow-up plan:   Return in about 3 weeks (around 07/18/2022).     Left L5/S1 ESI 04/11/22, Left L4 PNS 05/21/22, Right L5 PNS 06/27/22    Recent Visits Date Type Provider Dept  05/21/22 Procedure visit Gillis Santa, MD [facility]  05/09/22 Office Visit Gillis Santa, MD [facility]  04/11/22 Procedure visit Gillis Santa, MD [facility]  04/02/22 Office Visit Gillis Santa, MD [facility]  Showing recent visits within past 90 days and meeting all other requirements Today's Visits Date Type Provider Dept  06/27/22 Procedure visit Gillis Santa, MD [facility]  Showing today's visits and meeting all other requirements Future Appointments Date Type Provider Dept  07/23/22 Appointment Gillis Santa, MD [facility]  Showing future appointments within next 90 days and meeting all other requirements  Disposition: Discharge home  Discharge (Date  Time): 06/27/2022; 0914 hrs.   Primary Care Physician: Peggye Form, NP Location: [facility] Note by: Gillis Santa, MD Date: 06/27/2022; Time: 9:16 AM

## 2022-06-27 NOTE — Progress Notes (Signed)
Safety precautions to be maintained throughout the outpatient stay will include: orient to surroundings, keep bed in low position, maintain call bell within reach at all times, provide assistance with transfer out of bed and ambulation.  

## 2022-06-28 ENCOUNTER — Telehealth: Payer: Self-pay

## 2022-06-28 NOTE — Telephone Encounter (Signed)
Post procedure phone call.  Patient states she is doing fine.  

## 2022-07-18 ENCOUNTER — Ambulatory Visit: Payer: 59 | Admitting: Internal Medicine

## 2022-07-18 ENCOUNTER — Other Ambulatory Visit: Payer: 59

## 2022-07-23 ENCOUNTER — Ambulatory Visit
Payer: Medicare Other | Attending: Student in an Organized Health Care Education/Training Program | Admitting: Student in an Organized Health Care Education/Training Program

## 2022-07-23 ENCOUNTER — Encounter: Payer: Self-pay | Admitting: Student in an Organized Health Care Education/Training Program

## 2022-07-23 VITALS — BP 141/91 | HR 83 | Temp 97.3°F | Resp 16 | Ht 62.0 in | Wt 200.0 lb

## 2022-07-23 DIAGNOSIS — M47816 Spondylosis without myelopathy or radiculopathy, lumbar region: Secondary | ICD-10-CM | POA: Insufficient documentation

## 2022-07-23 DIAGNOSIS — G894 Chronic pain syndrome: Secondary | ICD-10-CM | POA: Diagnosis present

## 2022-07-23 DIAGNOSIS — M5136 Other intervertebral disc degeneration, lumbar region: Secondary | ICD-10-CM | POA: Insufficient documentation

## 2022-07-23 NOTE — Progress Notes (Signed)
Safety precautions to be maintained throughout the outpatient stay will include: orient to surroundings, keep bed in low position, maintain call bell within reach at all times, provide assistance with transfer out of bed and ambulation.  

## 2022-07-23 NOTE — Progress Notes (Signed)
PROVIDER NOTE: Information contained herein reflects review and annotations entered in association with encounter. Interpretation of such information and data should be left to medically-trained personnel. Information provided to patient can be located elsewhere in the medical record under "Patient Instructions". Document created using STT-dictation technology, any transcriptional errors that may result from process are unintentional.    Patient: Danielle Lewis  Service Category: E/M  Provider: Gillis Santa, MD  DOB: Jul 13, 1956  DOS: 07/23/2022  Referring Provider: Peggye Form, NP  MRN: [id number]  Specialty: Interventional Pain Management  PCP: Peggye Form, NP  Type: Established Patient  Setting: Ambulatory outpatient    Location: Office  Delivery: Face-to-face     HPI  [name], a 66 y.o. year old female, is here today because of her Lumbar facet arthropathy [M47.816]. Ms. Goracke primary complain today is Back Pain Last encounter: My last encounter with her was on 06/27/2022. Pertinent problems: Danielle Lewis has Lumbar spondylosis; Lumbar disc herniation with radiculopathy (L5/S1); Lumbar degenerative disc disease; and Chronic pain syndrome on their pertinent problem list. Pain Assessment: Severity of Chronic pain is reported as a 5 /10. Location: Back Lower/radiates back of legs bilateral below knees. Onset: More than a month ago. Quality: Aching, Constant ("toothache"). Timing: Constant. Modifying factor(s): rest. Vitals:  height is _0  (1.575 m) and weight is 200 lb (90.7 kg). Her temperature is 97.3 F (36.3 C) (abnormal). Her blood pressure is 141/91 (abnormal) and her pulse is 83. Her respiration is 16 and oxygen saturation is 97%.   Reason for encounter: post-procedure evaluation and assessment.   Patient presents today for removal of her [employer] peripheral nerve stimulator leads for L4 medial branch.  Her last 1 was placed 05/21/2022, her right one was placed 06/27/2022.   Unfortunately, she has not experienced significant pain relief or functional benefit while having her peripheral nerve stimulators in place.  She states that she has had trouble with adherence and that the tape kept coming off.  She also had difficulty with Wi-Fi connectivity of the battery pack.  She states that she did contact [employer] and did troubleshooting with them but was unsuccessful.  Peripheral nerve stimulator leads removed with tips intact.  Nursing staff present to validate.  ROS  Constitutional: Denies any fever or chills Gastrointestinal: No reported hemesis, hematochezia, vomiting, or acute GI distress Musculoskeletal:  Positive low back pain Neurological: No reported episodes of acute onset apraxia, aphasia, dysarthria, agnosia, amnesia, paralysis, loss of coordination, or loss of consciousness  Medication Review  ALPRAZolam, Albuterol, amLODipine, cetirizine, escitalopram, gabapentin, naproxen, omeprazole, traZODone, and zolpidem  History Review  Allergy: Danielle Lewis is allergic to penicillins. Drug: Danielle Lewis  reports that she does not currently use drugs. Alcohol:  reports that she does not currently use alcohol after a past usage of about 1.0 standard drink of alcohol per week. Tobacco:  reports that she has never smoked. She has never used smokeless tobacco. Social: Ms. Pixley  reports that she has never smoked. She has never used smokeless tobacco. She reports that she does not currently use alcohol after a past usage of about 1.0 standard drink of alcohol per week. She reports that she does not currently use drugs. Medical:  has a past medical history of Anxiety, Arthritis, Cancer ([place]), COVID-19 (2021), Depression, GERD (gastroesophageal reflux disease), Headache, Hypertension, and Pneumonia. Surgical: Danielle Lewis  has a past surgical history that includes Cholecystectomy; Appendectomy; Tonsillectomy; Abdominal hysterectomy; Lymph gland excision (Right, 01/22/2020); Portacath  placement (N/A,  02/24/2020); Breast biopsy (Right, 04/28/2021); Dilation and curettage of uterus; Excision of breast biopsy (Right, 05/19/2021); and Laparoscopic bilateral salpingo oophorectomy (Bilateral, 07/06/2021). Family: family history includes Breast cancer in her mother; Cancer in her mother.  Laboratory Chemistry Profile   Renal Lab Results  Component Value Date   BUN 18 01/05/2022   CREATININE 1.30 (H) 01/05/2022   BCR 15 05/09/2015   GFRAA >60 05/03/2020   GFRNONAA 46 (L) 01/05/2022    Hepatic Lab Results  Component Value Date   AST 47 (H) 01/05/2022   ALT 38 01/05/2022   ALBUMIN 4.0 01/05/2022   ALKPHOS 102 01/05/2022   HCVAB NON REACTIVE 01/11/2020    Electrolytes Lab Results  Component Value Date   NA 139 01/05/2022   K 3.8 01/05/2022   CL 107 01/05/2022   CALCIUM 8.7 (L) 01/05/2022    Bone No results found for: "VD25OH", "VD125OH2TOT", "JQ3009QZ3", "AQ7622QJ3", "25OHVITD1", "25OHVITD2", "25OHVITD3", "TESTOFREE", "TESTOSTERONE"  Inflammation (CRP: Acute Phase) (ESR: Chronic Phase) No results found for: "CRP", "ESRSEDRATE", "LATICACIDVEN"       Note: Above Lab results reviewed.  Recent Imaging Review  DG PAIN CLINIC C-ARM 1-60 MIN NO REPORT Fluoro was used, but no Radiologist interpretation will be provided.  Please refer to "NOTES" tab for provider progress note. Note: Reviewed        Physical Exam  General appearance: Well nourished, well developed, and well hydrated. In no apparent acute distress Mental status: Alert, oriented x 3 (person, place, & time)       Respiratory: No evidence of acute respiratory distress Eyes: PERLA Vitals: BP (!) 141/91   Pulse 83   Temp (!) 97.3 F (36.3 C)   Resp 16   Ht _0  (1.575 m)   Wt 200 lb (90.7 kg)   SpO2 97%   BMI 36.58 kg/m  BMI: Estimated body mass index is 36.58 kg/m as calculated from the following:   Height as of this encounter: _1  (1.575 m).   Weight as of this encounter: 200 lb (90.7  kg). Ideal: Ideal body weight: 50.1 kg (110 lb 7.2 oz) Adjusted ideal body weight: 66.3 kg (146 lb 4.3 oz)  Low back pain, worse with facet loading  Peripheral nerve stimulator leads removed with tips intact.  Insertion site clean, dry, not erythematous, nontender.  Assessment   Diagnosis Status  1. Lumbar facet arthropathy   2. Lumbar spondylosis   3. Lumbar degenerative disc disease   4. Chronic pain syndrome    Controlled Controlled Controlled    Plan of Care  Peripheral nerve stimulator leads removed with tips intact.  Continue to monitor symptoms.  Follow-up as needed.   Follow-up plan:   No follow-ups on file.     Left L5/S1 ESI 04/11/22, Left L4 PNS 05/21/22, Right L5 PNS 06/27/22     Recent Visits Date Type Provider Dept  06/27/22 Procedure visit Danielle Santa, MD [facility]  05/21/22 Procedure visit Danielle Santa, MD [facility]  05/09/22 Office Visit Danielle Santa, MD [facility]  Showing recent visits within past 90 days and meeting all other requirements Today's Visits Date Type Provider Dept  07/23/22 Office Visit Danielle Santa, MD [facility]  Showing today's visits and meeting all other requirements Future Appointments No visits were found meeting these conditions. Showing future appointments within next 90 days and meeting all other requirements  I discussed the assessment and treatment plan with the patient. The patient was provided an opportunity to ask questions  and all were answered. The patient agreed with the plan and demonstrated an understanding of the instructions.  Patient advised to call back or seek an in-person evaluation if the symptoms or condition worsens.  Duration of encounter: 22mnutes.  Total time on encounter, as per AMA guidelines included both the face-to-face and non-face-to-face time personally spent by the physician and/or other qualified health care professional(s) on the day of the  encounter (includes time in activities that require the physician or other qualified health care professional and does not include time in activities normally performed by clinical staff). Physician's time may include the following activities when performed: preparing to see the patient (eg, review of tests, pre-charting review of records) obtaining and/or reviewing separately obtained history performing a medically appropriate examination and/or evaluation counseling and educating the patient/family/caregiver ordering medications, tests, or procedures referring and communicating with other health care professionals (when not separately reported) documenting clinical information in the electronic or other health record independently interpreting results (not separately reported) and communicating results to the patient/ family/caregiver care coordination (not separately reported)  Note by: BGillis Santa MD Date: 07/23/2022; Time: 2:40 PM

## 2022-07-23 NOTE — Progress Notes (Signed)
PNS LEAD REMOVAL PER DR LATEEF. SITE CLEAR AND LEADS INTACT. SITE CLEANED PRIOR TO REMOVAL.

## 2022-07-27 ENCOUNTER — Other Ambulatory Visit: Payer: Self-pay

## 2022-07-27 ENCOUNTER — Ambulatory Visit: Payer: Self-pay | Admitting: Internal Medicine

## 2022-07-31 ENCOUNTER — Encounter: Payer: Self-pay | Admitting: Internal Medicine

## 2022-07-31 ENCOUNTER — Inpatient Hospital Stay: Payer: Medicare Other | Attending: Internal Medicine

## 2022-07-31 ENCOUNTER — Inpatient Hospital Stay (HOSPITAL_BASED_OUTPATIENT_CLINIC_OR_DEPARTMENT_OTHER): Payer: Medicare Other | Admitting: Internal Medicine

## 2022-07-31 VITALS — BP 138/83 | HR 73 | Temp 98.8°F | Resp 18 | Wt 200.2 lb

## 2022-07-31 DIAGNOSIS — Z8616 Personal history of COVID-19: Secondary | ICD-10-CM | POA: Diagnosis not present

## 2022-07-31 DIAGNOSIS — R232 Flushing: Secondary | ICD-10-CM | POA: Insufficient documentation

## 2022-07-31 DIAGNOSIS — Z79899 Other long term (current) drug therapy: Secondary | ICD-10-CM | POA: Diagnosis not present

## 2022-07-31 DIAGNOSIS — Z803 Family history of malignant neoplasm of breast: Secondary | ICD-10-CM | POA: Diagnosis not present

## 2022-07-31 DIAGNOSIS — M545 Low back pain, unspecified: Secondary | ICD-10-CM | POA: Diagnosis not present

## 2022-07-31 DIAGNOSIS — K219 Gastro-esophageal reflux disease without esophagitis: Secondary | ICD-10-CM | POA: Insufficient documentation

## 2022-07-31 DIAGNOSIS — I10 Essential (primary) hypertension: Secondary | ICD-10-CM | POA: Insufficient documentation

## 2022-07-31 DIAGNOSIS — R748 Abnormal levels of other serum enzymes: Secondary | ICD-10-CM | POA: Insufficient documentation

## 2022-07-31 DIAGNOSIS — C8218 Follicular lymphoma grade II, lymph nodes of multiple sites: Secondary | ICD-10-CM | POA: Insufficient documentation

## 2022-07-31 LAB — COMPREHENSIVE METABOLIC PANEL
ALT: 48 U/L — ABNORMAL HIGH (ref 0–44)
AST: 53 U/L — ABNORMAL HIGH (ref 15–41)
Albumin: 4.3 g/dL (ref 3.5–5.0)
Alkaline Phosphatase: 69 U/L (ref 38–126)
Anion gap: 8 (ref 5–15)
BUN: 18 mg/dL (ref 8–23)
CO2: 26 mmol/L (ref 22–32)
Calcium: 9.5 mg/dL (ref 8.9–10.3)
Chloride: 105 mmol/L (ref 98–111)
Creatinine, Ser: 1.29 mg/dL — ABNORMAL HIGH (ref 0.44–1.00)
GFR, Estimated: 46 mL/min — ABNORMAL LOW (ref >60)
Glucose, Bld: 93 mg/dL (ref 70–99)
Potassium: 4.1 mmol/L (ref 3.5–5.1)
Sodium: 139 mmol/L (ref 135–145)
Total Bilirubin: 0.8 mg/dL (ref 0.3–1.2)
Total Protein: 7.4 g/dL (ref 6.5–8.1)

## 2022-07-31 LAB — CBC WITH DIFFERENTIAL/PLATELET
Abs Immature Granulocytes: 0.02 10*3/uL (ref 0.00–0.07)
Basophils Absolute: 0.1 10*3/uL (ref 0.0–0.1)
Basophils Relative: 1 %
Eosinophils Absolute: 0.2 10*3/uL (ref 0.0–0.5)
Eosinophils Relative: 3 %
HCT: 43.9 % (ref 36.0–46.0)
Hemoglobin: 15.4 g/dL — ABNORMAL HIGH (ref 12.0–15.0)
Immature Granulocytes: 0 %
Lymphocytes Relative: 31 %
Lymphs Abs: 2.1 10*3/uL (ref 0.7–4.0)
MCH: 31.7 pg (ref 26.0–34.0)
MCHC: 35.1 g/dL (ref 30.0–36.0)
MCV: 90.3 fL (ref 80.0–100.0)
Monocytes Absolute: 0.8 10*3/uL (ref 0.1–1.0)
Monocytes Relative: 12 %
Neutro Abs: 3.7 10*3/uL (ref 1.7–7.7)
Neutrophils Relative %: 53 %
Platelets: 214 10*3/uL (ref 150–400)
RBC: 4.86 MIL/uL (ref 3.87–5.11)
RDW: 13.2 % (ref 11.5–15.5)
WBC: 6.9 10*3/uL (ref 4.0–10.5)
nRBC: 0 % (ref 0.0–0.2)

## 2022-07-31 LAB — LACTATE DEHYDROGENASE: LDH: 156 U/L (ref 98–192)

## 2022-07-31 NOTE — Assessment & Plan Note (Addendum)
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  MAY 2023-CT scan neck chest abdomen pelvis-negative for any progressive adenopathy/or any concerns for new lymphadenopathy or lesions; Clinically evidence of recurrence.  Monitor for now clinically.  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  Recommend weight loss.   # back pain/slipped disk s/p pain evaluation-awaiting surgical evaluation.  # Creatinine- 1.3/GFR-42- [MAY 2023 ]; repeat GFR- 42. Defer further evaluation with PCP/Nephrology.   # port/IV access- explanted.   # DISPOSITION:  # refer to dermatology re: skin tags.  # follow up in 6 months;MD; labs- cbc/cmp/LDH- Dr.B   Cc; Dr.Linthavong/Ms. fields NP.

## 2022-07-31 NOTE — Progress Notes (Signed)
[facility] CONSULT NOTE  Patient Care Team: Peggye Form, NP as PCP - General (Family Medicine) Herbert Pun, MD as Consulting Physician (General Surgery) Cammie Sickle, MD as Consulting Physician (Internal Medicine) Herbert Pun, MD as Consulting Physician (General Surgery)  CHIEF COMPLAINTS/PURPOSE OF CONSULTATION:   Oncology History Overview Note  # MAY 2021- Bilateral neck adenopathy right more than left; highly concerning for lymphoproliferative disorder.  Excisional biopsy right neck- [Dr.Cintron];  A. 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   B. 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   # MAY 2021-CBC/CMP normal /  mildly elevated alkaline phosphatase. [PCP; [facility]].  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  Ambulating dependently.  [name] 66 y.o.  female  follicular lymphoma grade 1-2 currently s/p bendamustine Rituxan-currently on surveillance is here for follow-up.  Currently under evaluation for low back pain.  Status post evaluation/treatment at the pain clinic.  Patient is currently in the process of being referred to surgery for consideration of surgical options.  Continues to have hot flashes-however overall stable.   Review of Systems  Constitutional:  Positive for diaphoresis and malaise/fatigue. Negative for chills,  fever and weight loss.  HENT:  Negative for nosebleeds and sore throat.   Eyes:  Negative for double vision.  Respiratory:  Negative for cough, hemoptysis, sputum production, shortness of breath and wheezing.   Cardiovascular:  Negative for chest pain, palpitations, orthopnea and leg swelling.  Gastrointestinal:  Negative for abdominal pain, blood in stool, constipation, diarrhea, heartburn, melena, nausea and vomiting.  Genitourinary:  Negative for dysuria, frequency and urgency.  Musculoskeletal:  Positive for back pain and joint pain.  Skin: Negative.  Negative for itching and rash.  Neurological:  Negative for dizziness, tingling, focal weakness, weakness and headaches.  Endo/Heme/Allergies:  Does not bruise/bleed easily.  Psychiatric/Behavioral:  Negative for depression. The patient has insomnia. The patient is not nervous/anxious.      MEDICAL HISTORY:  Past Medical History:  Diagnosis Date   Anxiety    Arthritis    Cancer ([place])    COVID-19 2021   Depression    GERD (gastroesophageal reflux disease)    Headache    Hypertension    Pneumonia     SURGICAL HISTORY: Past Surgical History:  Procedure Laterality Date   ABDOMINAL HYSTERECTOMY     APPENDECTOMY     BREAST BIOPSY Right 04/28/2021   U/s bx 7:00/2cmfn"heart" marker-INTRADUCTAL PAPILLOMA WITH APOCRINE METAPLASIA   CHOLECYSTECTOMY     28years ago   DILATION AND CURETTAGE OF UTERUS     EXCISION OF BREAST BIOPSY Right 05/19/2021   Procedure: EXCISION OF BREAST BIOPSY w/ Radio frequency tag;  Surgeon: Herbert Pun, MD;  Location: [facility];  Service: General;  Laterality: Right;   LAPAROSCOPIC BILATERAL SALPINGO OOPHERECTOMY Bilateral 07/06/2021   Procedure: LAPAROSCOPIC BILATERAL SALPINGO  OOPHORECTOMY WITH CYSTOSCOPY;  Surgeon: Malachy Mood, MD;  Location: [facility];  Service: Gynecology;  Laterality: Bilateral;   LYMPH GLAND EXCISION Right 01/22/2020   Procedure: CERVICAL LYMPH GLAND EXCISION;  Surgeon:  Herbert Pun, MD;  Location: [facility];  Service: General;  Laterality: Right;   PORTACATH PLACEMENT N/A 02/24/2020   Procedure: INSERTION PORT-A-CATH;  Surgeon: Herbert Pun, MD;  Location: [facility];  Service: General;  Laterality: N/A;   TONSILLECTOMY      SOCIAL HISTORY: Social History   Socioeconomic History   Marital status: Married    Spouse name: Not on file   Number of children: Not on file   Years of education: Not on file   Highest education level: Not on file  Occupational History   Not on file  Tobacco Use   Smoking status: Never   Smokeless tobacco: Never  Vaping Use   Vaping Use: Never used  Substance and Sexual Activity   Alcohol use: Not Currently    Alcohol/week: 1.0 standard drink of alcohol    Types: 1 Glasses of wine per week   Drug use: Not Currently   Sexual activity: Yes    Birth control/protection: Surgical    Comment: Tubal  Other Topics Concern   Not on file  Social History Narrative   Lives in [place]; [place]. Never smoked; wine rarely. Used in work in [employer]. With husband.    Social Determinants of Health   Financial Resource Strain: Not on file  Food Insecurity: Not on file  Transportation Needs: Not on file  Physical Activity: Not on file  Stress: Not on file  Social Connections: Not on file  Intimate Partner Violence: Not on file    FAMILY HISTORY: Family History  Problem Relation Age of Onset   Breast cancer Mother    Cancer Mother        unknown type    ALLERGIES:  is allergic to penicillins.  MEDICATIONS:  Current Outpatient Medications  Medication Sig Dispense Refill   ALBUTEROL IN Inhale into the lungs.     ALPRAZolam (XANAX) 0.5 MG tablet Take 0.5 mg by mouth in the morning and at bedtime.     amLODipine (NORVASC) 5 MG tablet Take 1 tablet by mouth once daily 30 tablet 0   cetirizine (ZYRTEC) 10 MG tablet Take 10 mg by mouth daily as needed for allergies.     escitalopram (LEXAPRO) 20 MG  tablet Take 20 mg by mouth at bedtime.     gabapentin (NEURONTIN) 300 MG capsule Take 300 mg by mouth 3 (three) times daily.     metoprolol succinate (TOPROL-XL) 25 MG 24 hr tablet Take 12.5 mg by mouth daily.     naproxen (NAPROSYN) 500 MG tablet Take 500 mg by mouth 2 (two) times daily.     omeprazole (PRILOSEC) 20 MG capsule Take 20 mg by mouth daily.     traZODone (DESYREL) 100 MG tablet Take 100 mg by mouth at bedtime.     zolpidem (AMBIEN CR) 6.25 MG CR tablet Take 1 tablet (6.25 mg total) by mouth at bedtime as needed. for sleep 30 tablet 0   No current facility-administered medications for this visit.      Marland Kitchen  PHYSICAL EXAMINATION: ECOG PERFORMANCE STATUS: 0 - Asymptomatic  Vitals:   07/31/22 1400  BP: 138/83  Pulse: 73  Resp: 18  Temp: 98.8 F (37.1 C)    Filed Weights   07/31/22 1400  Weight: 200 lb 3.2 oz (90.8 kg)  Physical Exam Constitutional:      Comments: Walk independently.  Accompanied by family.  HENT:     Head: Normocephalic and atraumatic.     Mouth/Throat:     Pharynx: No oropharyngeal exudate.  Eyes:     Pupils: Pupils are equal, round, and reactive to light.  Neck:     Comments: Neck lymphadenopathy improved. Cardiovascular:     Rate and Rhythm: Normal rate and regular rhythm.  Pulmonary:     Effort: Pulmonary effort is normal. No respiratory distress.     Breath sounds: Normal breath sounds. No wheezing.  Abdominal:     General: Bowel sounds are normal. There is no distension.     Palpations: Abdomen is soft. There is no mass.     Tenderness: There is no abdominal tenderness. There is no guarding or rebound.  Musculoskeletal:        General: No tenderness. Normal range of motion.     Cervical back: Normal range of motion and neck supple.  Skin:    General: Skin is warm.  Neurological:     Mental Status: She is alert and oriented to person, place, and time.  Psychiatric:        Mood and Affect: Affect normal.      LABORATORY  DATA:  I have reviewed the data as listed Lab Results  Component Value Date   WBC 6.9 07/31/2022   HGB 15.4 (H) 07/31/2022   HCT 43.9 07/31/2022   MCV 90.3 07/31/2022   PLT 214 07/31/2022   Recent Labs    10/06/21 1435 01/05/22 1345 07/31/22 1412  NA 136 139 139  K 3.7 3.8 4.1  CL 108 107 105  CO2 '22 23 26  '$ GLUCOSE 132* 81 93  BUN '16 18 18  '$ CREATININE 0.96 1.30* 1.29*  CALCIUM 9.0 8.7* 9.5  GFRNONAA >60 46* 46*  PROT 7.1 6.8 7.4  ALBUMIN 4.3 4.0 4.3  AST 38 47* 53*  ALT 39 38 48*  ALKPHOS 95 102 69  BILITOT 0.7 0.7 0.8    RADIOGRAPHIC STUDIES: I have personally reviewed the radiological images as listed and agreed with the findings in the report. No results found.  ASSESSMENT & PLAN:   Follicular lymphoma grade ii, lymph nodes of multiple sites ([place]) # Follicular lymphoma grade 1-2; at least stage III; s/p 6 cycles of Bendamustine Rituxan.  MAY 2023-CT scan neck chest abdomen pelvis-negative for any progressive adenopathy/or any concerns for new lymphadenopathy or lesions; Clinically evidence of recurrence.  Monitor for now clinically.  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  Recommend weight loss.   # back pain/slipped disk s/p pain evaluation-awaiting surgical evaluation.  # Creatinine- 1.3/GFR-42- [MAY 2023 ]; repeat GFR- 42. Defer further evaluation with PCP/Nephrology.   # port/IV access- explanted.   # DISPOSITION:  # refer to dermatology re: skin tags.  # follow up in 6 months;MD; labs- cbc/cmp/LDH- Dr.B   Cc; Dr.Linthavong/Ms. fields NP.    All questions were answered. The patient knows to call the clinic with any problems, questions or concerns.    Cammie Sickle, MD 07/31/2022 3:25 PM

## 2022-07-31 NOTE — Progress Notes (Signed)
Currently under evaluation for low back pain.

## 2022-08-22 ENCOUNTER — Telehealth: Payer: Medicare Other | Admitting: Physician Assistant

## 2022-08-22 DIAGNOSIS — J069 Acute upper respiratory infection, unspecified: Secondary | ICD-10-CM | POA: Diagnosis not present

## 2022-08-22 MED ORDER — FLUTICASONE PROPIONATE 50 MCG/ACT NA SUSP: 2.0000 | Freq: Every day | NASAL | 0 refills | Status: AC

## 2022-08-22 MED ORDER — BENZONATATE 100 MG PO CAPS: 100.0000 mg | ORAL_CAPSULE | Freq: Three times a day (TID) | ORAL | 0 refills | Status: DC | PRN

## 2022-08-22 NOTE — Progress Notes (Signed)
I have spent 5 minutes in review of e-visit questionnaire, review and updating patient chart, medical decision making and response to patient.   Jaleya Pebley Cody Valli Randol, PA-C    

## 2022-08-22 NOTE — Progress Notes (Signed)

## 2022-08-29 ENCOUNTER — Other Ambulatory Visit: Payer: Self-pay | Admitting: Student in an Organized Health Care Education/Training Program

## 2022-08-29 ENCOUNTER — Encounter: Payer: Self-pay | Admitting: Internal Medicine

## 2022-09-04 HISTORY — PX: OTHER SURGICAL HISTORY: SHX169

## 2022-09-24 ENCOUNTER — Ambulatory Visit
Admission: RE | Admit: 2022-09-24 | Discharge: 2022-09-24 | Disposition: A | Discharge status: Home / Self Care | Payer: Medicare (Managed Care) | Source: Ambulatory Visit | Attending: Student in an Organized Health Care Education/Training Program | Admitting: Student in an Organized Health Care Education/Training Program

## 2022-09-24 ENCOUNTER — Other Ambulatory Visit: Payer: Self-pay | Admitting: Internal Medicine

## 2022-09-24 ENCOUNTER — Encounter: Payer: Self-pay | Admitting: Student in an Organized Health Care Education/Training Program

## 2022-09-24 ENCOUNTER — Ambulatory Visit
Payer: Medicare (Managed Care) | Attending: Student in an Organized Health Care Education/Training Program | Admitting: Student in an Organized Health Care Education/Training Program

## 2022-09-24 VITALS — BP 128/72 | HR 79 | Temp 97.5°F | Ht 63.0 in | Wt 200.0 lb

## 2022-09-24 DIAGNOSIS — M5416 Radiculopathy, lumbar region: Secondary | ICD-10-CM | POA: Insufficient documentation

## 2022-09-24 DIAGNOSIS — G894 Chronic pain syndrome: Secondary | ICD-10-CM | POA: Diagnosis not present

## 2022-09-24 DIAGNOSIS — M5116 Intervertebral disc disorders with radiculopathy, lumbar region: Secondary | ICD-10-CM | POA: Diagnosis not present

## 2022-09-24 DIAGNOSIS — M792 Neuralgia and neuritis, unspecified: Secondary | ICD-10-CM

## 2022-09-24 DIAGNOSIS — G8929 Other chronic pain: Secondary | ICD-10-CM | POA: Insufficient documentation

## 2022-09-24 NOTE — Progress Notes (Signed)
PROVIDER NOTE: Information contained herein reflects review and annotations entered in association with encounter. Interpretation of such information and data should be left to medically-trained personnel. Information provided to patient can be located elsewhere in the medical record under "Patient Instructions". Document created using STT-dictation technology, any transcriptional errors that may result from process are unintentional.    Patient: Danielle Lewis  Service Category: E/M  Provider: Gillis Santa, MD  DOB: 1955/08/28  DOS: 09/24/2022  Referring Provider: Peggye Form, NP  MRN: [id number]  Specialty: Interventional Pain Management  PCP: Danielle Form, NP  Type: Established Patient  Setting: Ambulatory outpatient    Location: Office  Delivery: Face-to-face     HPI  [name], a 67 y.o. year old female, is here today because of her Chronic radicular lumbar pain [M54.16, G89.29]. Danielle Lewis primary complain today is Back Pain (lower) Last encounter: My last encounter with her was on 06/27/2022. Pertinent problems: Danielle Lewis has Lumbar spondylosis; Lumbar disc herniation with radiculopathy (L5/S1); Lumbar degenerative disc disease; and Chronic pain syndrome on their pertinent problem list. Pain Assessment: Severity of Chronic pain is reported as a 6 /10. Location: Back Lower/radiates to right hip. Onset: More than a month ago. Quality: Constant, Tingling, Stabbing, Throbbing, Numbness. Timing: Constant. Modifying factor(s): denies. Vitals:  height is '5\' 3"'$  (1.6 m) and weight is 200 lb (90.7 kg). Her temporal temperature is 97.5 F (36.4 C) (abnormal). Her blood pressure is 128/72 and her pulse is 79. Her oxygen saturation is 96%.   Reason for encounter: patient-requested evaluation.  Patient presents today for increased lumbar spine pain with radiation into bilateral legs as well as associated weakness.  She states that the pain is very debilitating for her and results in disability.   She was previously seen by Dr. Alba Lewis.  She has had a bilateral L5-S1 transforaminal epidural steroid injections as well as bilateral L3, L4, L5 facet medial branch nerve blocks without any benefit.  She also had a [employer] peripheral nerve stimulation procedure with me done for bilateral L4 medial branches which was not helpful and were removed in early November.  Today we discussed spinal cord stimulation as a potential option for her low back and bilateral leg pain..  I have provided her resources to get her psychological clearance.  After that, I will attempt to place an order for spinal cord stimulator trial.  07/23/22 Patient presents today for removal of her [employer] peripheral nerve stimulator leads for L4 medial branch.  Her last 1 was placed 05/21/2022, her right one was placed 06/27/2022.  Unfortunately, she has not experienced significant pain relief or functional benefit while having her peripheral nerve stimulators in place.  She states that she has had trouble with adherence and that the tape kept coming off.  She also had difficulty with Wi-Fi connectivity of the battery pack.  She states that she did contact [employer] and did troubleshooting with them but was unsuccessful.  Peripheral nerve stimulator leads removed with tips intact.  Nursing staff present to validate.  ROS  Constitutional: Denies any fever or chills Gastrointestinal: No reported hemesis, hematochezia, vomiting, or acute GI distress Musculoskeletal:  Positive low back pain, bilateral leg pain right greater than left Neurological: No reported episodes of acute onset apraxia, aphasia, dysarthria, agnosia, amnesia, paralysis, loss of coordination, or loss of consciousness  Medication Review  ALPRAZolam, Albuterol, amLODipine, benzonatate, cetirizine, escitalopram, fluticasone, gabapentin, metoprolol succinate, naproxen, omeprazole, traZODone, and zolpidem  History Review  Allergy: Danielle Lewis  is allergic to penicillins. Drug: Ms.  Lewis  reports that she does not currently use drugs. Alcohol:  reports that she does not currently use alcohol after a past usage of about 1.0 standard drink of alcohol per week. Tobacco:  reports that she has never smoked. She has never used smokeless tobacco. Social: Danielle Lewis  reports that she has never smoked. She has never used smokeless tobacco. She reports that she does not currently use alcohol after a past usage of about 1.0 standard drink of alcohol per week. She reports that she does not currently use drugs. Medical:  has a past medical history of Anxiety, Arthritis, Cancer ([place]), COVID-19 (2021), Depression, GERD (gastroesophageal reflux disease), Headache, Hypertension, and Pneumonia. Surgical: Danielle Lewis  has a past surgical history that includes Cholecystectomy; Appendectomy; Tonsillectomy; Abdominal hysterectomy; Lymph gland excision (Right, 01/22/2020); Portacath placement (N/A, 02/24/2020); Breast biopsy (Right, 04/28/2021); Dilation and curettage of uterus; Excision of breast biopsy (Right, 05/19/2021); Laparoscopic bilateral salpingo oophorectomy (Bilateral, 07/06/2021); and cyst removal from head (Right, 09/04/2022). Family: family history includes Breast cancer in her mother; Cancer in her mother.  Laboratory Chemistry Profile   Renal Lab Results  Component Value Date   BUN 18 07/31/2022   CREATININE 1.29 (H) 07/31/2022   BCR 15 05/09/2015   GFRAA >60 05/03/2020   GFRNONAA 46 (L) 07/31/2022    Hepatic Lab Results  Component Value Date   AST 53 (H) 07/31/2022   ALT 48 (H) 07/31/2022   ALBUMIN 4.3 07/31/2022   ALKPHOS 69 07/31/2022   HCVAB NON REACTIVE 01/11/2020    Electrolytes Lab Results  Component Value Date   NA 139 07/31/2022   K 4.1 07/31/2022   CL 105 07/31/2022   CALCIUM 9.5 07/31/2022    Bone No results found for: "VD25OH", "VD125OH2TOT", "GE9528UX3", "KG4010UV2", "25OHVITD1", "25OHVITD2", "25OHVITD3", "TESTOFREE", "TESTOSTERONE"  Inflammation (CRP:  Acute Phase) (ESR: Chronic Phase) No results found for: "CRP", "ESRSEDRATE", "LATICACIDVEN"       Note: Above Lab results reviewed.  Recent Imaging Review  CLINICAL DATA:  Acute midline low back pain without sciatica M54.50 (ICD-10-CM).   EXAM: MRI LUMBAR SPINE WITHOUT CONTRAST   TECHNIQUE: Multiplanar, multisequence MR imaging of the lumbar spine was performed. No intravenous contrast was administered.   COMPARISON:  None.   FINDINGS: Segmentation:  Standard.   Alignment:  Physiologic.   Vertebrae: No fracture, evidence of discitis, or aggressive bone lesion. Hemangioma in the L1 vertebral body.   Conus medullaris and cauda equina: Conus extends to the T12-L1 level. Conus and cauda equina appear normal.   Paraspinal and other soft tissues: Bilateral renal cysts.   Disc levels:   T12-L1: Tiny central disc protrusion. No significant spinal canal or neural foraminal stenosis.   L1-2: No spinal canal or neural foraminal stenosis.   L2-3: Tiny right foraminal disc protrusion and mild facet degenerative changes. No significant spinal canal or neural foraminal stenosis.   L3-4: Shallow disc bulge and mild facet degenerative changes without significant spinal canal or neural foraminal stenosis.   L4-5: Shallow disc bulge and mild facet degenerative changes without significant spinal canal or neural foraminal stenosis.   L5-S1: Disc bulge with right foraminal/far lateral disc osteophyte complex and mild facet degenerative changes resulting in moderate right neural foraminal narrowing, impinging on the exiting right L5 nerve root. No significant spinal canal stenosis.   IMPRESSION: Mild degenerative changes of the lumbar spine, more pronounced at L5-S1 where there is moderate right neural foraminal narrowing, impinging on the exiting right  L5 nerve root.     Electronically Signed   By: Pedro Earls M.D.   On: 11/06/2021 09:14  Physical Exam   General appearance: Well nourished, well developed, and well hydrated. In no apparent acute distress Mental status: Alert, oriented x 3 (person, place, & time)       Respiratory: No evidence of acute respiratory distress Eyes: PERLA Vitals: BP 128/72   Pulse 79   Temp (!) 97.5 F (36.4 C) (Temporal)   Ht '5\' 3"'$  (1.6 m)   Wt 200 lb (90.7 kg)   SpO2 96%   BMI 35.43 kg/m  BMI: Estimated body mass index is 35.43 kg/m as calculated from the following:   Height as of this encounter: '5\' 3"'$  (1.6 m).   Weight as of this encounter: 200 lb (90.7 kg). Ideal: Ideal body weight: 52.4 kg (115 lb 8.3 oz) Adjusted ideal body weight: 67.7 kg (149 lb 5 oz)  Low back pain, worse with facet loading  Bilateral lower extremity pain, neuropathic  Assessment   Diagnosis Status  1. Chronic radicular lumbar pain   2. Intractable neuropathic pain of lower extremity, left   3. Intractable neuropathic pain of right lower extremity   4. Lumbar disc herniation with radiculopathy (L5/S1)   5. Chronic pain syndrome    Persistent Worsening Worsening    Plan of Care  Discussed spinal cord stimulation at length with patient.  I also evaluated her interlaminar spaces under live fluoroscopy.  Recommend that she complete a psychological clearance evaluation for SCS trial.  After that has been done, she is instructed to contact our clinic at which point I will review psych eval and place order for SCS trial.   Follow-up plan:   Return for After psych eval.     Left L5/S1 ESI 04/11/22, Left L4 PNS 05/21/22, Right L5 PNS 06/27/22     Recent Visits Date Type Provider Dept  07/23/22 Office Visit Danielle Santa, MD [facility]  06/27/22 Procedure visit Danielle Santa, MD [facility]  Showing recent visits within past 90 days and meeting all other requirements Today's Visits Date Type Provider Dept  09/24/22 Office Visit Danielle Santa, MD [facility]  Showing today's visits and  meeting all other requirements Future Appointments No visits were found meeting these conditions. Showing future appointments within next 90 days and meeting all other requirements  I discussed the assessment and treatment plan with the patient. The patient was provided an opportunity to ask questions and all were answered. The patient agreed with the plan and demonstrated an understanding of the instructions.  Patient advised to call back or seek an in-person evaluation if the symptoms or condition worsens.  Duration of encounter: 74mnutes.  Total time on encounter, as per AMA guidelines included both the face-to-face and non-face-to-face time personally spent by the physician and/or other qualified health care professional(s) on the day of the encounter (includes time in activities that require the physician or other qualified health care professional and does not include time in activities normally performed by clinical staff). Physician's time may include the following activities when performed: preparing to see the patient (eg, review of tests, pre-charting review of records) obtaining and/or reviewing separately obtained history performing a medically appropriate examination and/or evaluation counseling and educating the patient/family/caregiver ordering medications, tests, or procedures referring and communicating with other health care professionals (when not separately reported) documenting clinical information in the electronic or other health record independently interpreting results (not separately  reported) and communicating results to the patient/ family/caregiver care coordination (not separately reported)  Note by: Danielle Santa, MD Date: 09/24/2022; Time: 3:05 PM

## 2022-09-24 NOTE — Progress Notes (Signed)
Safety precautions to be maintained throughout the outpatient stay will include: orient to surroundings, keep bed in low position, maintain call bell within reach at all times, provide assistance with transfer out of bed and ambulation.  

## 2022-10-01 ENCOUNTER — Encounter: Payer: Self-pay | Admitting: Internal Medicine

## 2022-10-23 ENCOUNTER — Ambulatory Visit
Payer: Medicare (Managed Care) | Attending: Student in an Organized Health Care Education/Training Program | Admitting: Student in an Organized Health Care Education/Training Program

## 2022-10-23 ENCOUNTER — Encounter: Payer: Self-pay | Admitting: Student in an Organized Health Care Education/Training Program

## 2022-10-23 VITALS — BP 99/82 | HR 73 | Temp 97.2°F | Resp 16 | Ht 62.0 in | Wt 200.0 lb

## 2022-10-23 DIAGNOSIS — M5416 Radiculopathy, lumbar region: Secondary | ICD-10-CM | POA: Diagnosis present

## 2022-10-23 DIAGNOSIS — G894 Chronic pain syndrome: Secondary | ICD-10-CM | POA: Diagnosis present

## 2022-10-23 DIAGNOSIS — G8929 Other chronic pain: Secondary | ICD-10-CM | POA: Insufficient documentation

## 2022-10-23 DIAGNOSIS — M792 Neuralgia and neuritis, unspecified: Secondary | ICD-10-CM

## 2022-10-23 DIAGNOSIS — M5116 Intervertebral disc disorders with radiculopathy, lumbar region: Secondary | ICD-10-CM | POA: Diagnosis not present

## 2022-10-23 MED ORDER — PREGABALIN 50 MG PO CAPS: 50.0000 mg | ORAL_CAPSULE | Freq: Three times a day (TID) | ORAL | 2 refills | Status: DC

## 2022-10-23 MED ORDER — ACETAMINOPHEN 500 MG PO TABS: 500.0000 mg | ORAL_TABLET | Freq: Four times a day (QID) | ORAL | 0 refills | Status: DC | PRN

## 2022-10-23 NOTE — Progress Notes (Signed)
Safety precautions to be maintained throughout the outpatient stay will include: orient to surroundings, keep bed in low position, maintain call bell within reach at all times, provide assistance with transfer out of bed and ambulation.  

## 2022-10-23 NOTE — Progress Notes (Signed)
PROVIDER NOTE: Information contained herein reflects review and annotations entered in association with encounter. Interpretation of such information and data should be left to medically-trained personnel. Information provided to patient can be located elsewhere in the medical record under "Patient Instructions". Document created using STT-dictation technology, any transcriptional errors that may result from process are unintentional.    Patient: Danielle Lewis  Service Category: E/M  Provider: Gillis Santa, MD  DOB: May 19, 1956  DOS: 10/23/2022  Referring Provider: Peggye Form, NP  MRN: [id number]  Specialty: Interventional Pain Management  PCP: Danielle Form, NP  Type: Established Patient  Setting: Ambulatory outpatient    Location: Office  Delivery: Face-to-face     HPI  [name], a 67 y.o. year old female, is here today because of her Intractable neuropathic pain of lower extremity, left [M79.2]. [name] primary complain today is Back Pain (low) Last encounter: My last encounter with her was on 09/24/2022. Pertinent problems: [name] has Lumbar spondylosis; Lumbar disc herniation with radiculopathy (L5/S1); Lumbar degenerative disc disease; and Chronic pain syndrome on their pertinent problem list. Pain Assessment: Severity of Chronic pain is reported as a 8 /10. Location: Back Lower/radiates down the back of both legs to the ankle. Onset: More than a month ago. Quality: Cramping, Sore. Timing: Constant. Modifying factor(s): nothing. Vitals:  height is '5\' 2"'$  (1.575 m) and weight is 200 lb (90.7 kg). Her temperature is 97.2 F (36.2 C) (abnormal). Her blood pressure is 99/82 and her pulse is 73. Her respiration is 16 and oxygen saturation is 99%.  BMI: Estimated body mass index is 36.58 kg/m as calculated from the following:   Height as of this encounter: '5\' 2"'$  (1.575 m).   Weight as of this encounter: 200 lb (90.7 kg).  Reason for encounter: evaluation for possible  interventional PM therapy/treatment.    Patient continues to endorse severe and debilitating low back pain with radiation down the back of both legs to her ankle in a dermatomal fashion.  She has completed her psych evaluation and is ready to move forward with spinal cord stimulator trial which we have discussed in the past.  She has done physical therapy in the past with limited response.  She tries to perform lumbar spinal exercises that she has learned in physical therapy at home but she states that has no impact on her pain.  09/14/2022: patient-requested evaluation.  Patient presents today for increased lumbar spine pain with radiation into bilateral legs as well as associated weakness.  She states that the pain is very debilitating for her and results in disability.  She was previously seen by Dr. Alba Lewis.  She has had a bilateral L5-S1 transforaminal epidural steroid injections as well as bilateral L3, L4, L5 facet medial branch nerve blocks without any benefit.  She also had a [employer] peripheral nerve stimulation procedure with me done for bilateral L4 medial branches which was not helpful and were removed in early November.  Today we discussed spinal cord stimulation as a potential option for her low back and bilateral leg pain..  I have provided her resources to get her psychological clearance.  After that, I will attempt to place an order for spinal cord stimulator trial.   07/23/22 Patient presents today for removal of her [employer] peripheral nerve stimulator leads for L4 medial branch.  Her last 1 was placed 05/21/2022, her right one was placed 06/27/2022.  Unfortunately, she has not experienced significant pain relief or functional benefit while having  her peripheral nerve stimulators in place.  She states that she has had trouble with adherence and that the tape kept coming off.  She also had difficulty with Wi-Fi connectivity of the battery pack.  She states that she did contact [employer] and did  troubleshooting with them but was unsuccessful.  Peripheral nerve stimulator leads removed with tips intact.  Nursing staff present to validate.   ROS  Constitutional: Denies any fever or chills Gastrointestinal: No reported hemesis, hematochezia, vomiting, or acute GI distress Musculoskeletal:  Positive low back pain, bilateral leg pain right greater than left Neurological: No reported episodes of acute onset apraxia, aphasia, dysarthria, agnosia, amnesia, paralysis, loss of coordination, or loss of consciousnes  Medication Review  ALPRAZolam, Albuterol, acetaminophen, albuterol, amLODipine, benzonatate, cetirizine, escitalopram, fluticasone, gabapentin, metoprolol succinate, omeprazole, pregabalin, traMADol, traZODone, and zolpidem  History Review  Allergy: [name] is allergic to penicillins. Drug: [name]  reports that she does not currently use drugs. Alcohol:  reports that she does not currently use alcohol after a past usage of about 1.0 standard drink of alcohol per week. Tobacco:  reports that she has never smoked. She has never used smokeless tobacco. Social: [name]  reports that she has never smoked. She has never used smokeless tobacco. She reports that she does not currently use alcohol after a past usage of about 1.0 standard drink of alcohol per week. She reports that she does not currently use drugs. Medical:  has a past medical history of Anxiety, Arthritis, Cancer ([place]), COVID-19 (2021), Depression, GERD (gastroesophageal reflux disease), Headache, Hypertension, and Pneumonia. Surgical: [name]  has a past surgical history that includes Cholecystectomy; Appendectomy; Tonsillectomy; Abdominal hysterectomy; Lymph gland excision (Right, 01/22/2020); Portacath placement (N/A, 02/24/2020); Breast biopsy (Right, 04/28/2021); Dilation and curettage of uterus; Excision of breast biopsy (Right, 05/19/2021); Laparoscopic bilateral salpingo oophorectomy (Bilateral, 07/06/2021); and cyst  removal from head (Right, 09/04/2022). Family: family history includes Breast cancer in her mother; Cancer in her mother.  Laboratory Chemistry Profile   Renal Lab Results  Component Value Date   BUN 18 07/31/2022   CREATININE 1.29 (H) 07/31/2022   BCR 15 05/09/2015   GFRAA >60 05/03/2020   GFRNONAA 46 (L) 07/31/2022    Hepatic Lab Results  Component Value Date   AST 53 (H) 07/31/2022   ALT 48 (H) 07/31/2022   ALBUMIN 4.3 07/31/2022   ALKPHOS 69 07/31/2022   HCVAB NON REACTIVE 01/11/2020    Electrolytes Lab Results  Component Value Date   NA 139 07/31/2022   K 4.1 07/31/2022   CL 105 07/31/2022   CALCIUM 9.5 07/31/2022    Bone No results found for: "VD25OH", "VD125OH2TOT", "[id number]", "[id number]", "25OHVITD1", "25OHVITD2", "25OHVITD3", "TESTOFREE", "TESTOSTERONE"  Inflammation (CRP: Acute Phase) (ESR: Chronic Phase) No results found for: "CRP", "ESRSEDRATE", "LATICACIDVEN"       Note: Above Lab results reviewed.   Recent Imaging Review  CLINICAL DATA:  Acute midline low back pain without sciatica M54.50 (ICD-10-CM).   EXAM: MRI LUMBAR SPINE WITHOUT CONTRAST   TECHNIQUE: Multiplanar, multisequence MR imaging of the lumbar spine was performed. No intravenous contrast was administered.   COMPARISON:  None.   FINDINGS: Segmentation:  Standard.   Alignment:  Physiologic.   Vertebrae: No fracture, evidence of discitis, or aggressive bone lesion. Hemangioma in the L1 vertebral body.   Conus medullaris and cauda equina: Conus extends to the T12-L1 level. Conus and cauda equina appear normal.   Paraspinal and other soft tissues: Bilateral renal cysts.   Disc  levels:   T12-L1: Tiny central disc protrusion. No significant spinal canal or neural foraminal stenosis.   L1-2: No spinal canal or neural foraminal stenosis.   L2-3: Tiny right foraminal disc protrusion and mild facet degenerative changes. No significant spinal canal or neural foraminal  stenosis.   L3-4: Shallow disc bulge and mild facet degenerative changes without significant spinal canal or neural foraminal stenosis.   L4-5: Shallow disc bulge and mild facet degenerative changes without significant spinal canal or neural foraminal stenosis.   L5-S1: Disc bulge with right foraminal/far lateral disc osteophyte complex and mild facet degenerative changes resulting in moderate right neural foraminal narrowing, impinging on the exiting right L5 nerve root. No significant spinal canal stenosis.   IMPRESSION: Mild degenerative changes of the lumbar spine, more pronounced at L5-S1 where there is moderate right neural foraminal narrowing, impinging on the exiting right L5 nerve root.     Electronically Signed   By: Pedro Earls M.D.   On: 11/06/2021 09:14  Physical Exam  General appearance: Well nourished, well developed, and well hydrated. In no apparent acute distress Mental status: Alert, oriented x 3 (person, place, & time)       Respiratory: No evidence of acute respiratory distress Eyes: PERLA Vitals: BP 99/82   Pulse 73   Temp (!) 97.2 F (36.2 C)   Resp 16   Ht '5\' 2"'$  (1.575 m)   Wt 200 lb (90.7 kg)   SpO2 99%   BMI 36.58 kg/m  BMI: Estimated body mass index is 36.58 kg/m as calculated from the following:   Height as of this encounter: '5\' 2"'$  (1.575 m).   Weight as of this encounter: 200 lb (90.7 kg). Ideal: Ideal body weight: 50.1 kg (110 lb 7.2 oz) Adjusted ideal body weight: 66.3 kg (146 lb 4.3 oz)  Lumbar Spine Area Exam  Skin & Axial Inspection: No masses, redness, or swelling Alignment: Symmetrical Functional ROM: Pain restricted ROM affecting both sides Stability: No instability detected Muscle Tone/Strength: Functionally intact. No obvious neuro-muscular anomalies detected. Sensory (Neurological): Neurogenic pain pattern  Gait & Posture Assessment  Ambulation: Unassisted Gait: Relatively normal for age and body  habitus Posture: WNL  Lower Extremity Exam    Side: Right lower extremity  Side: Left lower extremity  Stability: No instability observed          Stability: No instability observed          Skin & Extremity Inspection: Skin color, temperature, and hair growth are WNL. No peripheral edema or cyanosis. No masses, redness, swelling, asymmetry, or associated skin lesions. No contractures.  Skin & Extremity Inspection: Skin color, temperature, and hair growth are WNL. No peripheral edema or cyanosis. No masses, redness, swelling, asymmetry, or associated skin lesions. No contractures.  Functional ROM: Pain restricted ROM for hip and knee joints          Functional ROM: Pain restricted ROM for hip and knee joints          Muscle Tone/Strength: Functionally intact. No obvious neuro-muscular anomalies detected.  Muscle Tone/Strength: Functionally intact. No obvious neuro-muscular anomalies detected.  Sensory (Neurological): Neurogenic pain pattern        Sensory (Neurological): Neurogenic pain pattern        DTR: Patellar: deferred today Achilles: deferred today Plantar: deferred today  DTR: Patellar: deferred today Achilles: deferred today Plantar: deferred today  Palpation: No palpable anomalies  Palpation: No palpable anomalies    Assessment   Diagnosis Status  1.  Intractable neuropathic pain of lower extremity, left   2. 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 Lumbar disc herniation with radiculopathy (L5/S1)   4. Chronic radicular lumbar pain   5. Chronic pain syndrome    Persistent Persistent Persistent   Updated Problems: Problem  Intractable Neuropathic Pain of Lower Extremity, Left    Plan of Care  I discussed  percutaneous spinal cord stimulator trial with the patient in detail. I explained to the patient that they will have an external power source and programmer which the patient will use for 7 days. There will be daily communication with the stimulator  company and the patient. A possible need for a mid-trial clinic visit to give the patient the best chance of success.   Patient has completed psychosocial behavioral evaluation.   Some of patient's pain does seem to be mechanical in nature, with some component of neurogenic pain as well. We discussed the indications for spinal cord stimulation, specifically stating that it is typically better for neuropathic and appendicular pain, but that we have had some success in the treatment of low back and hip pain.   Patient is interested in proceeding with spinal cord stimulation trial.  She understands that this may not be successful, and that spinal cord stimulation in general is not a "magic bullet."   We had a lengthy and very detailed discussion of all the risks, benefits, alternatives, and rationale of surgery as well as the option of continuing nonsurgical therapies. We specifically discussed the risks of temporary or permanent worsened neurologic injury, no symptomatic relief or pain made worse after procedure, and also the need for future surgery (due to infection, CSF leak, bleeding, adjacent segment issues, bone-healing difficulties, and other related issues). No guarantees of outcome were made or implied and he is eager to proceed and presents for definitive treatment.  Ms. Gallegos told me that all of her questions were answered thoroughly and to her satisfaction. Confidence and understanding of the discussed risks and consequences of  treatment was expressed and he accepted these risks and was eager to proceed with procedure.   Issues concerning treatment and diagnosis were discussed with the patient. There are no barriers to understanding the plan of treatment. Explanation was well received by patient and/or family who then verbalized understanding.    Pharmacotherapy (Medications Ordered): Meds ordered this encounter  Medications   pregabalin (LYRICA) 50 MG capsule    Sig: Take 1 capsule (50 mg  total) by mouth 3 (three) times daily.    Dispense:  90 capsule    Refill:  2   acetaminophen (TYLENOL) 500 MG tablet    Sig: Take 1 tablet (500 mg total) by mouth every 6 (six) hours as needed.    Dispense:  30 tablet    Refill:  0   Orders:  Orders Placed This Encounter  Procedures   [facility] representative to notify them of the scheduled case and to make sure they will be available to provide required equipment.    Standing Status:   Future    Standing Expiration Date:   01/21/2023    Scheduling Instructions:     Side: Bilateral     Level: Lumbar     Device: [employer]     Sedation: With sedation     Timeframe: As soon as Emergency planning/management officer Question:   Where will this procedure be performed?    Answer:  [facility]   Follow-up plan:   Return in about 1 month (around 11/21/2022) for [facility] trial, ECT.      Left L5/S1 ESI 04/11/22, Left L4 PNS 05/21/22, Right L5 PNS 06/27/22       Recent Visits Date Type Provider Dept  09/24/22 Office Visit Danielle Santa, MD [facility]  Showing recent visits within past 90 days and meeting all other requirements Today's Visits Date Type Provider Dept  10/23/22 Office Visit Danielle Santa, MD [facility]  Showing today's visits and meeting all other requirements Future Appointments No visits were found meeting these conditions. Showing future appointments within next 90 days and meeting all other requirements  I discussed the assessment and treatment plan with the patient. The patient was provided an opportunity to ask questions and all were answered. The patient agreed with the plan and demonstrated an understanding of the instructions.  Patient advised to call back or seek an in-person evaluation if the symptoms or condition worsens.  Duration of encounter: 3mnutes.  Total time on encounter, as per AMA guidelines included both the face-to-face and  non-face-to-face time personally spent by the physician and/or other qualified health care professional(s) on the day of the encounter (includes time in activities that require the physician or other qualified health care professional and does not include time in activities normally performed by clinical staff). Physician's time may include the following activities when performed: Preparing to see the patient (e.g., pre-charting review of records, searching for previously ordered imaging, lab work, and nerve conduction tests) Review of prior analgesic pharmacotherapies. Reviewing PMP Interpreting ordered tests (e.g., lab work, imaging, nerve conduction tests) Performing post-procedure evaluations, including interpretation of diagnostic procedures Obtaining and/or reviewing separately obtained history Performing a medically appropriate examination and/or evaluation Counseling and educating the patient/family/caregiver Ordering medications, tests, or procedures Referring and communicating with other health care professionals (when not separately reported) Documenting clinical information in the electronic or other health record Independently interpreting results (not separately reported) and communicating results to the patient/ family/caregiver Care coordination (not separately reported)  Note by: BGillis Santa MD Date: 10/23/2022; Time: 1:52 PM

## 2022-10-23 NOTE — Patient Instructions (Signed)
____________________________________________________________________________________________  General Risks and Possible Complications  Patient Responsibilities: It is important that you read this as it is part of your informed consent. It is our duty to inform you of the risks and possible complications associated with treatments offered to you. It is your responsibility as a patient to read this and to ask questions about anything that is not clear or that you believe was not covered in this document.  Patient's Rights: You have the right to refuse treatment. You also have the right to change your mind, even after initially having agreed to have the treatment done. However, under this last option, if you wait until the last second to change your mind, you may be charged for the materials used up to that point.  Introduction: Medicine is not an Chief Strategy Officer. Everything in Medicine, including the lack of treatment(s), carries the potential for danger, harm, or loss (which is by definition: Risk). In Medicine, a complication is a secondary problem, condition, or disease that can aggravate an already existing one. All treatments carry the risk of possible complications. The fact that a side effects or complications occurs, does not imply that the treatment was conducted incorrectly. It must be clearly understood that these can happen even when everything is done following the highest safety standards.  No treatment: You can choose not to proceed with the proposed treatment alternative. The "PRO(s)" would include: avoiding the risk of complications associated with the therapy. The "CON(s)" would include: not getting any of the treatment benefits. These benefits fall under one of three categories: diagnostic; therapeutic; and/or palliative. Diagnostic benefits include: getting information which can ultimately lead to improvement of the disease or symptom(s). Therapeutic benefits are those associated with  the successful treatment of the disease. Finally, palliative benefits are those related to the decrease of the primary symptoms, without necessarily curing the condition (example: decreasing the pain from a flare-up of a chronic condition, such as incurable terminal cancer).  General Risks and Complications: These are associated to most interventional treatments. They can occur alone, or in combination. They fall under one of the following six (6) categories: no benefit or worsening of symptoms; bleeding; infection; nerve damage; allergic reactions; and/or death. No benefits or worsening of symptoms: In Medicine there are no guarantees, only probabilities. No healthcare provider can ever guarantee that a medical treatment will work, they can only state the probability that it may. Furthermore, there is always the possibility that the condition may worsen, either directly, or indirectly, as a consequence of the treatment. Bleeding: This is more common if the patient is taking a blood thinner, either prescription or over the counter (example: Goody Powders, Fish oil, Aspirin, Garlic, etc.), or if suffering a condition associated with impaired coagulation (example: Hemophilia, cirrhosis of the liver, low platelet counts, etc.). However, even if you do not have one on these, it can still happen. If you have any of these conditions, or take one of these drugs, make sure to notify your treating physician. Infection: This is more common in patients with a compromised immune system, either due to disease (example: diabetes, cancer, human immunodeficiency virus [HIV], etc.), or due to medications or treatments (example: therapies used to treat cancer and rheumatological diseases). However, even if you do not have one on these, it can still happen. If you have any of these conditions, or take one of these drugs, make sure to notify your treating physician. Nerve Damage: This is more common when the treatment is an  invasive one, but it can also happen with the use of medications, such as those used in the treatment of cancer. The damage can occur to small secondary nerves, or to large primary ones, such as those in the spinal cord and brain. This damage may be temporary or permanent and it may lead to impairments that can range from temporary numbness to permanent paralysis and/or brain death. Allergic Reactions: Any time a substance or material comes in contact with our body, there is the possibility of an allergic reaction. These can range from a mild skin rash (contact dermatitis) to a severe systemic reaction (anaphylactic reaction), which can result in death. Death: In general, any medical intervention can result in death, most of the time due to an unforeseen complication. ____________________________________________________________________________________________  [employer] given re: SCS Trial

## 2022-10-25 ENCOUNTER — Ambulatory Visit: Payer: Medicare (Managed Care) | Admitting: Student in an Organized Health Care Education/Training Program

## 2022-11-20 DIAGNOSIS — Z90710 Acquired absence of both cervix and uterus: Secondary | ICD-10-CM | POA: Insufficient documentation

## 2022-11-20 DIAGNOSIS — J4520 Mild intermittent asthma, uncomplicated: Secondary | ICD-10-CM | POA: Insufficient documentation

## 2022-11-20 DIAGNOSIS — I10 Essential (primary) hypertension: Secondary | ICD-10-CM | POA: Insufficient documentation

## 2022-11-20 DIAGNOSIS — G4700 Insomnia, unspecified: Secondary | ICD-10-CM | POA: Insufficient documentation

## 2022-11-20 DIAGNOSIS — F411 Generalized anxiety disorder: Secondary | ICD-10-CM | POA: Insufficient documentation

## 2022-11-20 DIAGNOSIS — F32A Depression, unspecified: Secondary | ICD-10-CM | POA: Insufficient documentation

## 2022-11-20 DIAGNOSIS — K219 Gastro-esophageal reflux disease without esophagitis: Secondary | ICD-10-CM | POA: Insufficient documentation

## 2022-11-28 ENCOUNTER — Ambulatory Visit
Payer: Medicare (Managed Care) | Attending: Student in an Organized Health Care Education/Training Program | Admitting: Student in an Organized Health Care Education/Training Program

## 2022-11-28 ENCOUNTER — Encounter: Payer: Self-pay | Admitting: Student in an Organized Health Care Education/Training Program

## 2022-11-28 ENCOUNTER — Ambulatory Visit
Admission: RE | Admit: 2022-11-28 | Discharge: 2022-11-28 | Disposition: A | Discharge status: Home / Self Care | Payer: Medicare (Managed Care) | Source: Ambulatory Visit | Attending: Student in an Organized Health Care Education/Training Program | Admitting: Student in an Organized Health Care Education/Training Program

## 2022-11-28 DIAGNOSIS — M792 Neuralgia and neuritis, unspecified: Secondary | ICD-10-CM | POA: Diagnosis not present

## 2022-11-28 DIAGNOSIS — G894 Chronic pain syndrome: Secondary | ICD-10-CM | POA: Diagnosis not present

## 2022-11-28 DIAGNOSIS — G8929 Other chronic pain: Secondary | ICD-10-CM

## 2022-11-28 DIAGNOSIS — M5416 Radiculopathy, lumbar region: Secondary | ICD-10-CM | POA: Diagnosis present

## 2022-11-28 DIAGNOSIS — M5116 Intervertebral disc disorders with radiculopathy, lumbar region: Secondary | ICD-10-CM | POA: Diagnosis present

## 2022-11-28 MED ORDER — LIDOCAINE HCL 2 % IJ SOLN
INTRAMUSCULAR | Status: AC
  Filled 2022-11-28: qty 20

## 2022-11-28 MED ORDER — FENTANYL CITRATE (PF) 100 MCG/2ML IJ SOLN
INTRAMUSCULAR | Status: AC
  Filled 2022-11-28: qty 2

## 2022-11-28 MED ORDER — CEFAZOLIN SODIUM 1 G IJ SOLR
INTRAMUSCULAR | Status: AC
  Filled 2022-11-28: qty 20

## 2022-11-28 MED ORDER — CEPHALEXIN 500 MG PO CAPS: 500.0000 mg | ORAL_CAPSULE | Freq: Four times a day (QID) | ORAL | 0 refills | Status: AC

## 2022-11-28 MED ORDER — LIDOCAINE HCL 2 % IJ SOLN
20.0000 mL | Freq: Once | INTRAMUSCULAR | Status: AC
  Administered 2022-11-28: 400 mg

## 2022-11-28 MED ORDER — ROPIVACAINE HCL 2 MG/ML IJ SOLN
20.0000 mL | Freq: Once | INTRAMUSCULAR | Status: AC
  Administered 2022-11-28: 20 mL

## 2022-11-28 MED ORDER — MIDAZOLAM HCL 5 MG/5ML IJ SOLN
0.5000 mg | Freq: Once | INTRAMUSCULAR | Status: AC
  Administered 2022-11-28: 2 mg via INTRAVENOUS

## 2022-11-28 MED ORDER — MIDAZOLAM HCL 5 MG/5ML IJ SOLN
INTRAMUSCULAR | Status: AC
  Filled 2022-11-28: qty 5

## 2022-11-28 MED ORDER — ROPIVACAINE HCL 2 MG/ML IJ SOLN
INTRAMUSCULAR | Status: AC
  Filled 2022-11-28: qty 20

## 2022-11-28 MED ORDER — CEFAZOLIN SODIUM-DEXTROSE 2-4 GM/100ML-% IV SOLN
2.0000 g | Freq: Once | INTRAVENOUS | Status: AC
  Administered 2022-11-28: 2 g via INTRAVENOUS

## 2022-11-28 MED ORDER — FENTANYL CITRATE (PF) 100 MCG/2ML IJ SOLN
25.0000 ug | INTRAMUSCULAR | Status: DC | PRN
  Administered 2022-11-28: 50 ug via INTRAVENOUS

## 2022-11-28 NOTE — Progress Notes (Signed)
Safety precautions to be maintained throughout the outpatient stay will include: orient to surroundings, keep bed in low position, maintain call bell within reach at all times, provide assistance with transfer out of bed and ambulation.  

## 2022-11-28 NOTE — Progress Notes (Signed)
PROVIDER NOTE: Interpretation of information contained herein should be left to medically-trained personnel. Specific patient instructions are provided elsewhere under "Patient Instructions" section of medical record. This document was created in part using STT-dictation technology, any transcriptional errors that may result from this process are unintentional.  Patient: Danielle Lewis Type: Established DOB: 1956/03/07 MRN: [id number] PCP: Peggye Form, NP  Service: Procedure DOS: 11/28/2022 Setting: Ambulatory Location: Ambulatory outpatient facility Delivery: Face-to-face Provider: Gillis Santa, MD Specialty: Interventional Pain Management Specialty designation: 09 Location: Outpatient facility Ref. Prov.: Gillis Santa, MD       Interventional Therapy   Primary Reason for Admission: Surgical management of chronic pain condition.   Procedure:              Type: [employer] Trial Spinal Cord Neurostimulator Implant (Percutaneous, interlaminar, posterior epidural placement) Laterality: Bilateral (-50)  Level: Lumbar  Imaging: Fluoroscopic guidance Anesthesia: Local anesthesia (1-2% Lidocaine) Sedation: Moderate Sedation                       DOS: 11/28/2022  Performed by: Gillis Santa, MD  Purpose: Diagnostic. To determine if a permanent implant may be effective in controlling some or all of Danielle Lewis's chronic pain symptoms.  Rationale (medical necessity): procedure needed and proper for the diagnosis and/or treatment of Danielle Lewis's medical symptoms and needs. 1. Intractable neuropathic pain of lower extremity, left   2. 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 Lumbar disc herniation with radiculopathy (L5/S1)   4. Chronic radicular lumbar pain   5. Chronic pain syndrome    NAS-11 Pain score:   Pre-procedure: 8 /10   Post-procedure: 0-No pain/10     Target: Posterior epidural space over the dorsal columns of the spinal cord. Location: Posterior  intraspinal canal Region: Thoracolumbar  Approach: Translaminar percutaneous  Type of procedure: Surgical   Position / Prep / Materials:  Position: Prone  Prep solution: DuraPrep (Iodine Povacrylex [0.7% available iodine] and Isopropyl Alcohol, 74% w/w) Prep Area: Entire  Posterior  Thoracolumbar   Materials:  Tray: Implant tray Needle(s):  Type: Epidural  Gauge (G):  14   Length: Regular (10cm)  Qty: 2  Pre-op H&P Assessment:  Danielle Lewis is a 67 y.o. (year old), female patient, seen today for interventional treatment. She  has a past surgical history that includes Cholecystectomy; Appendectomy; Tonsillectomy; Abdominal hysterectomy; Lymph gland excision (Right, 01/22/2020); Portacath placement (N/A, 02/24/2020); Breast biopsy (Right, 04/28/2021); Dilation and curettage of uterus; Excision of breast biopsy (Right, 05/19/2021); Laparoscopic bilateral salpingo oophorectomy (Bilateral, 07/06/2021); and cyst removal from head (Right, 09/04/2022).  Initial Vital Signs:  Pulse/EKG Rate: 72ECG Heart Rate: 68 (TRIGEMENY) Temp: (!) 97.3 F (36.3 C) Resp: 18 BP: 115/75 SpO2: 95 %  BMI: Estimated body mass index is 35.43 kg/m as calculated from the following:   Height as of this encounter: 5\' 3"  (1.6 m).   Weight as of this encounter: 200 lb (90.7 kg).  Risk Assessment: Allergies: Reviewed. She is allergic to penicillins.  Allergy Precautions: None required Coagulopathies: Reviewed. None identified.  Blood-thinner therapy: None at this time Active Infection(s): Reviewed. None identified. Danielle Lewis is afebrile  Site Confirmation: Danielle Lewis was asked to confirm the procedure and laterality before marking the site, which she did. Procedure checklist: Completed Consent: Before the procedure and under the influence of no sedative(s), amnesic(s), or anxiolytics, the patient was informed of the treatment options, risks and possible complications. To fulfill our ethical and legal obligations,  as  recommended by the American Medical Association's Code of Ethics, I have informed the patient of my clinical impression; the nature and purpose of the treatment or procedure; the risks, benefits, and possible complications of the intervention; the alternatives, including doing nothing; the risk(s) and benefit(s) of the alternative treatment(s) or procedure(s); and the risk(s) and benefit(s) of doing nothing.  Danielle Lewis was provided with information about the general risks and possible complications associated with most interventional procedures. These include, but are not limited to: failure to achieve desired goals, infection, bleeding, organ or nerve damage, allergic reactions, paralysis, and/or death.  In addition, she was informed of those risks and possible complications associated to this particular procedure, which include, but are not limited to: damage to the implant; failure to decrease pain; local, systemic, or serious CNS infections, intraspinal abscess with possible cord compression and paralysis, or life-threatening such as meningitis; intrathecal and/or epidural bleeding with formation of hematoma with possible spinal cord compression and permanent paralysis; organ damage; nerve injury or damage with subsequent sensory, motor, and/or autonomic system dysfunction, resulting in transient or permanent pain, numbness, and/or weakness of one or several areas of the body; allergic reactions, either minor or major life-threatening, such as anaphylactic or anaphylactoid reactions.  Furthermore, Danielle Lewis was informed of those risks and complications associated with the medications. These include, but are not limited to: allergic reactions (i.e.: anaphylactic or anaphylactoid reactions); arrhythmia;  Hypotension/hypertension; cardiovascular collapse; respiratory depression and/or shortness of breath; swelling or edema; medication-induced neural toxicity; particulate matter embolism and blood vessel  occlusion with resultant organ, and/or nervous system infarction and permanent paralysis.  Finally, she was informed that Medicine is not an exact science; therefore, there is also the possibility of unforeseen or unpredictable risks and/or possible complications that may result in a catastrophic outcome. The patient indicated having understood very clearly. We have given the patient no guarantees and we have made no promises. Enough time was given to the patient to ask questions, all of which were answered to the patient's satisfaction. Danielle Lewis has indicated that she wanted to continue with the procedure. Attestation: I, the ordering provider, attest that I have discussed with the patient the benefits, risks, side-effects, alternatives, likelihood of achieving goals, and potential problems during recovery for the procedure that I have provided informed consent. Date  Time: 11/28/2022  7:50 AM  Pre-Procedure Preparation:  Monitoring: As per clinic protocol. Respiration, ETCO2, SpO2, BP, heart rate and rhythm monitor placed and checked for adequate function Safety Precautions: Patient was assessed for positional comfort and pressure points before starting the procedure. Time-out: I initiated and conducted the "Time-out" before starting the procedure, as per protocol. The patient was asked to participate by confirming the accuracy of the "Time Out" information. Verification of the correct person, site, and procedure were performed and confirmed by me, the nursing staff, and the patient. "Time-out" conducted as per Joint Commission's Universal Protocol (UP.01.01.01). Time:   Start Time: 0840 hrs.  Description/Narrative of Procedure:          Rationale (medical necessity): procedure needed and proper for the diagnosis and/or treatment of the patient's medical symptoms and needs. Procedural Technique Safety Precautions: Aspiration looking for blood return was conducted prior to all injections. At no point  did we inject any substances, as a needle was being advanced. No attempts were made at seeking any paresthesias. Safe injection practices and needle disposal techniques used. Medications properly checked for expiration dates. SDV (single dose vial) medications used. Description  of the Procedure: Protocol guidelines were followed. The patient was assisted into a comfortable position. The target area was identified and the area prepped in the usual manner. Skin & deeper tissues infiltrated with local anesthetic. Appropriate amount of time allowed to pass for local anesthetics to take effect. The procedure needles were then advanced to the target area. Proper needle placement secured. Negative aspiration confirmed. Solution injected in intermittent fashion, asking for systemic symptoms every 0.5cc of injectate. The needles were then removed and the area cleansed, making sure to leave some of the prepping solution back to take advantage of its long term bactericidal properties.  Technical description of procedure: Availability of a responsible, adult driver, and NPO status confirmed. Informed consent was obtained after having discussed risks and possible complications. An IV was started. The patient was then taken to the fluoroscopy suite, where the patient was placed in position for the procedure, over the fluoroscopy table. The patient was then monitored in the usual manner. Fluoroscopy was manipulated to obtain the best possible view of the target. Parallex error was corrected before commencing the procedure. Once a clear view of the target had been obtained, the skin and deeper tissues over the procedure site were infiltrated using lidocaine, loaded in a 10 cc luer-loc syringe with a 0.5 inch, 25-G needle. The introducer needle(s) was/were then inserted through the skin and deeper tissues. A paramidline approach was used to enter the posterior epidural space at a 30 angle, using "Loss-of-resistance Technique"  with 3 ml of PF-NaCl (0.9% NSS). Correct needle placement was confirmed in the antero-posterior and lateral fluoroscopic views. The lead was gently introduced and manipulated under real-time fluoroscopy, constantly assessing for pain, discomfort, or paresthesias, until the tip rested at the desired level. Both sides were done in identical fashion. Electrode placement was tested until appropriate coverage was attained. Once the patient confirmed that the stimulation was over the desired area, the lead(s) was/were secured in place and the introducer needles removed. This was done under real-time fluoroscopy while observing the electrode tip to avoid unintended migration. The area was covered with a non-occlusive dressing and the patient transported to recovery for further programming.  Vitals:   11/28/22 0947 11/28/22 0957 11/28/22 1007 11/28/22 1018  BP: (!) 128/58 107/86 (!) 120/59 112/65  Pulse:      Resp: 16 16 16 16   Temp:      TempSrc:      SpO2:   96% 96%  Weight:      Height:        Start Time: 0840 hrs. End Time: 0936 hrs.  Neurostimulator Details:   Lead(s):  Brand: [employer]         Epidural Access Level:  T12-L1 T11-12  Lead implant:  Bilateral   No. of Electrodes/Lead:  16 16  Laterality:  Left Right  Top electrode location:  T6 T6  Model No.: [id number] Same  Length: 50cm Same  Lot No.: [id number]  MRI compatibility:  Yes                             Imaging Guidance (Spinal):          Type of Imaging Technique: Fluoroscopy Guidance (Spinal) Indication(s): Assistance in needle guidance and placement for procedures requiring needle placement in or near specific anatomical locations not easily accessible without such assistance. Exposure Time: Please see nurses notes. Contrast: None used. Fluoroscopic Guidance: I was personally present during the  use of fluoroscopy. "Tunnel Vision Technique" used to obtain the best possible view of the target area.  Parallax error corrected before commencing the procedure. "Direction-depth-direction" technique used to introduce the needle under continuous pulsed fluoroscopy. Once target was reached, antero-posterior, oblique, and lateral fluoroscopic projection used confirm needle placement in all planes. Images permanently stored in EMR. Interpretation: No contrast injected. I personally interpreted the imaging intraoperatively. Adequate needle placement confirmed in multiple planes. Permanent images saved into the patient's record.  Antibiotic Prophylaxis:   Anti-infectives (From admission, onward)    Start     Dose/Rate Route Frequency Ordered Stop   11/28/22 0830  ceFAZolin (ANCEF) IVPB 2g/100 mL premix        2 g 200 mL/hr over 30 Minutes Intravenous  Once 11/28/22 0818 11/28/22 0849   11/28/22 0000  cephALEXin (KEFLEX) 500 MG capsule        500 mg Oral 4 times daily 11/28/22 1034 12/05/22 2359      Indication(s): Implant Prophylaxis.  Post-operative Assessment:  Post-procedure Vital Signs:  Pulse/HCG Rate: 7270 Temp: (!) 97.3 F (36.3 C) Resp: 16 BP: 112/65 SpO2: 96 %  Complications: No immediate post-treatment complications observed by team, or reported by patient.  Note: The patient tolerated the entire procedure well. A repeat set of vitals were taken after the procedure and the patient was kept under observation following institutional policy, for this type of procedure. Post-procedural neurological assessment was performed, showing return to baseline, prior to discharge. The patient was provided with post-procedure discharge instructions, including a section on how to identify potential problems. Should any problems arise concerning this procedure, the patient was given instructions to immediately contact us, at any time, without hesitation. In any case, we plan to contact the patient by telephone for a follow-up status report regarding this interventional procedure.  Comments:  No  additional relevant information.  Plan of Care  Orders:  Orders Placed This Encounter  Procedures   DG PAIN CLINIC C-ARM 1-60 MIN NO REPORT    Intraoperative interpretation by procedural physician at [place].    Standing Status:   Standing    Number of Occurrences:   1    Order Specific Question:   Reason for exam:    Answer:   Assistance in needle guidance and placement for procedures requiring needle placement in or near specific anatomical locations not easily accessible without such assistance.     Medications administered: We administered lidocaine, ceFAZolin, fentaNYL, ropivacaine (PF) 2 mg/mL (0.2%), and midazolam.  See the medical record for exact dosing, route, and time of administration.  Follow-up plan:   Return in about 6 days (around 12/04/2022) for SCS lead pull 10 am.       Left L5/S1 ESI 04/11/22, Left L4 PNS 05/21/22, Right L5 PNS 06/27/22, SCS trial [employer]        Recent Visits Date Type Provider Dept  10/23/22 Office Visit Gillis Santa, MD [facility]  09/24/22 Office Visit Gillis Santa, MD [facility]  Showing recent visits within past 90 days and meeting all other requirements Today's Visits Date Type Provider Dept  11/28/22 Procedure visit Gillis Santa, MD [facility]  Showing today's visits and meeting all other requirements Future Appointments Date Type Provider Dept  12/04/22 Appointment Gillis Santa, MD [facility]  Showing future appointments within next 90 days and meeting all other requirements  Disposition: Discharge home  Discharge (Date  Time): 11/28/2022; 1030 hrs.   Primary Care Physician: Oneida Alar,  Colan Neptune, NP Location: [facility] Note by: Gillis Santa, MD (TTS technology used. I apologize for any typographical errors that were not detected and corrected.) Date: 11/28/2022; Time: 10:36 AM

## 2022-11-28 NOTE — Patient Instructions (Signed)
Today we did the following -We have done a Spinal Cord Stimulator Trial with [employer]  -As long as the leads are in place, do not bathe or shower. You may sponge bathe.  -While the lead is in place, please limit the bending, lifting, or twisting because the lead can move.  -The things we want to see is if your pain improves (and by what percentage), if you can do more activity (don't overdo it), and if you can use less of your "as needed" medicine. Do not stop long acting medicines like methadone, oxycontin, MS Contin, etc without checking with [place].  -It is VERY important that you pick up the antibiotics we prescribed, Keflex, on your way home from the trial and take them as prescribed(4 times a day), starting today, for as long as the lead is in place.  -The Spina Cord Stimulator Representative will be in contact with you while the lead is in place to make sure the trial goes as well as possible.  -Please contact us with any questions or concerns at any time during the trial.   -If you start running a fever over 100 degrees, have severe back pain, or new pain running down the legs, or drainage coming from the lead site, contact us immediately and/or go to the emergency room.  -Please do not restart any sort of medication that can thin your blood such as Aspirin, ibuprofen, motrin, aleve, plavix, coumadin, etc. If you aren't sure, call and ask.  -We will have you return Tuesday to have the lead removed. If this is successful, at that point we can go over the details about the permanent implant.

## 2022-11-29 ENCOUNTER — Telehealth: Payer: Self-pay

## 2022-11-29 NOTE — Telephone Encounter (Signed)
Post procedure follow up. Patient states she is doing fine.  

## 2022-12-04 ENCOUNTER — Ambulatory Visit
Admission: RE | Admit: 2022-12-04 | Discharge: 2022-12-04 | Disposition: A | Discharge status: Home / Self Care | Payer: Medicare (Managed Care) | Source: Ambulatory Visit | Attending: Student in an Organized Health Care Education/Training Program | Admitting: Student in an Organized Health Care Education/Training Program

## 2022-12-04 ENCOUNTER — Ambulatory Visit
Payer: Medicare (Managed Care) | Attending: Student in an Organized Health Care Education/Training Program | Admitting: Student in an Organized Health Care Education/Training Program

## 2022-12-04 ENCOUNTER — Ambulatory Visit: Payer: Medicare (Managed Care) | Admitting: Student in an Organized Health Care Education/Training Program

## 2022-12-04 ENCOUNTER — Encounter: Payer: Self-pay | Admitting: Student in an Organized Health Care Education/Training Program

## 2022-12-04 VITALS — BP 111/70 | HR 65 | Temp 97.3°F | Resp 16 | Ht 62.0 in | Wt 200.0 lb

## 2022-12-04 DIAGNOSIS — M5116 Intervertebral disc disorders with radiculopathy, lumbar region: Secondary | ICD-10-CM

## 2022-12-04 DIAGNOSIS — M792 Neuralgia and neuritis, unspecified: Secondary | ICD-10-CM

## 2022-12-04 DIAGNOSIS — G894 Chronic pain syndrome: Secondary | ICD-10-CM

## 2022-12-04 DIAGNOSIS — G8929 Other chronic pain: Secondary | ICD-10-CM

## 2022-12-04 NOTE — Progress Notes (Signed)
PROVIDER NOTE: Information contained herein reflects review and annotations entered in association with encounter. Interpretation of such information and data should be left to medically-trained personnel. Information provided to patient can be located elsewhere in the medical record under "Patient Instructions". Document created using STT-dictation technology, any transcriptional errors that may result from process are unintentional.    Patient: Danielle Lewis  Service Category: E/M  Provider: Edward JollyBilal Neeley Sedivy, MD  DOB: 11-Oct-1955  DOS: 12/04/2022  Referring Provider: Alm BustardFields, Glenda L, NP  MRN: [id number]  Specialty: Interventional Pain Management  PCP: Alm BustardFields, Glenda L, NP  Type: Established Patient  Setting: Ambulatory outpatient    Location: Office  Delivery: Face-to-face     HPI  Danielle Lewis, a 67 y.o. year old female, is here today because of her Intractable neuropathic pain of lower extremity, left [M79.2]. Danielle Lewis's primary complain today is Back Pain (Lumbar bilateral )  Pertinent problems: Danielle Lewis has Lumbar spondylosis; Lumbar disc herniation with radiculopathy (L5/S1); Lumbar degenerative disc disease; and Chronic pain syndrome on their pertinent problem list. Pain Assessment: Severity of Chronic pain is reported as a 3 /10. Location: Back Left, Right, Lower/radiates down legs to the ankles.. Onset: More than a month ago. Quality: Aching, Constant, Discomfort (all of these s/s were relieved with stim trial). Timing: Constant. Modifying factor(s): SCS relieved pain. Vitals:  height is 5\' 2"  (1.575 m) and weight is 200 lb (90.7 kg). Her temporal temperature is 97.3 F (36.3 C) (abnormal). Her blood pressure is 111/70 and her pulse is 65. Her respiration is 16 and oxygen saturation is 94%.  BMI: Estimated body mass index is 36.58 kg/m as calculated from the following:   Height as of this encounter: 5\' 2"  (1.575 m).   Weight as of this encounter: 200 lb (90.7 kg). Last encounter:  10/23/2022. Last procedure: 11/28/2022.  Reason for encounter: post-procedure evaluation and assessment.    Post-procedure evaluation   Type: [employer] Trial Spinal Cord Neurostimulator Implant (Percutaneous, interlaminar, posterior epidural placement) Laterality: Bilateral (-50)  Level: Lumbar  Imaging: Fluoroscopic guidance Anesthesia: Local anesthesia (1-2% Lidocaine) Sedation: Moderate Sedation                       DOS: 11/28/2022  Performed by: Edward JollyBilal Tallulah Hosman, MD  60% pain relief during trial duration, also improvement in ADLs, patient would like to move forward with SCS implant    ROS  Constitutional: Denies any fever or chills Gastrointestinal: No reported hemesis, hematochezia, vomiting, or acute GI distress Musculoskeletal:  improvement in low back pain Neurological: No reported episodes of acute onset apraxia, aphasia, dysarthria, agnosia, amnesia, paralysis, loss of coordination, or loss of consciousness  Medication Review  ALPRAZolam, Albuterol, Cholecalciferol, acetaminophen, albuterol, amLODipine, benzonatate, cephALEXin, cetirizine, escitalopram, fluticasone, gabapentin, hydrochlorothiazide, metoprolol succinate, omeprazole, pregabalin, traMADol, traZODone, and zolpidem  History Review  Allergy: Danielle Lewis is allergic to penicillins. Drug: Danielle Lewis  reports that she does not currently use drugs. Alcohol:  reports that she does not currently use alcohol after a past usage of about 1.0 standard drink of alcohol per week. Tobacco:  reports that she has never smoked. She has never used smokeless tobacco. Social: Danielle Lewis  reports that she has never smoked. She has never used smokeless tobacco. She reports that she does not currently use alcohol after a past usage of about 1.0 standard drink of alcohol per week. She reports that she does not currently use drugs. Medical:  has a past medical history  of Anxiety, Arthritis, Cancer, COVID-19 (2021), Depression, GERD  (gastroesophageal reflux disease), Headache, Hypertension, and Pneumonia. Surgical: Danielle Lewis  has a past surgical history that includes Cholecystectomy; Appendectomy; Tonsillectomy; Abdominal hysterectomy; Lymph gland excision (Right, 01/22/2020); Portacath placement (N/A, 02/24/2020); Breast biopsy (Right, 04/28/2021); Dilation and curettage of uterus; Excision of breast biopsy (Right, 05/19/2021); Laparoscopic bilateral salpingo oophorectomy (Bilateral, 07/06/2021); and cyst removal from head (Right, 09/04/2022). Family: family history includes Breast cancer in her mother; Cancer in her mother.  Laboratory Chemistry Profile   Renal Lab Results  Component Value Date   BUN 18 07/31/2022   CREATININE 1.29 (H) 07/31/2022   BCR 15 05/09/2015   GFRAA >60 05/03/2020   GFRNONAA 46 (L) 07/31/2022    Hepatic Lab Results  Component Value Date   AST 53 (H) 07/31/2022   ALT 48 (H) 07/31/2022   ALBUMIN 4.3 07/31/2022   ALKPHOS 69 07/31/2022   HCVAB NON REACTIVE 01/11/2020    Electrolytes Lab Results  Component Value Date   NA 139 07/31/2022   K 4.1 07/31/2022   CL 105 07/31/2022   CALCIUM 9.5 07/31/2022    Bone No results found for: "VD25OH", "VD125OH2TOT", "PF7902IO9", "BD5329JM4", "25OHVITD1", "25OHVITD2", "25OHVITD3", "TESTOFREE", "TESTOSTERONE"  Inflammation (CRP: Acute Phase) (ESR: Chronic Phase) No results found for: "CRP", "ESRSEDRATE", "LATICACIDVEN"       Note: Above Lab results reviewed.   Physical Exam  General appearance: Well nourished, well developed, and well hydrated. In no apparent acute distress Mental status: Alert, oriented x 3 (person, place, & time)       Respiratory: No evidence of acute respiratory distress Eyes: PERLA Vitals: BP 111/70 (BP Location: Right Arm, Patient Position: Sitting, Cuff Size: Normal)   Pulse 65   Temp (!) 97.3 F (36.3 C) (Temporal)   Resp 16   Ht 5\' 2"  (1.575 m)   Wt 200 lb (90.7 kg)   SpO2 94%   BMI 36.58 kg/m  BMI: Estimated  body mass index is 36.58 kg/m as calculated from the following:   Height as of this encounter: 5\' 2"  (1.575 m).   Weight as of this encounter: 200 lb (90.7 kg). Ideal: Ideal body weight: 50.1 kg (110 lb 7.2 oz) Adjusted ideal body weight: 66.3 kg (146 lb 4.3 oz)  SCS trial leads removed under live fluoroscopy with tips intac Insertion site C/D, non erythematous   Assessment   Diagnosis Status  1. Intractable neuropathic pain of lower extremity, left   2. 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 Lumbar disc herniation with radiculopathy (L5/S1)   4. Chronic radicular lumbar pain   5. Chronic pain syndrome    Controlled Controlled Controlled    Plan of Care  Referral to Dr Marcell Barlow for [facility] Scientific SCS implant    Orders:  Orders Placed This Encounter  Procedures   DG PAIN CLINIC C-ARM 1-60 MIN NO REPORT    Intraoperative interpretation by procedural physician at [facility].    Standing Status:   Standing    Number of Occurrences:   1    Order Specific Question:   Reason for exam:    Answer:   Assistance in needle guidance and placement for procedures requiring needle placement in or near specific anatomical locations not easily accessible without such assistance.   Ambulatory referral to Neurosurgery    Referral Priority:   Routine    Referral Type:   Surgical    Referral Reason:   Specialty Services Required    Referred to  Provider:   Venetia Night, MD    Requested Specialty:   Neurosurgery    Number of Visits Requested:   1   Follow-up plan:   Return for patient will call to schedule F2F appt prn.      Left L5/S1 ESI 04/11/22, Left L4 PNS 05/21/22, Right L5 PNS 06/27/22, SCS trial [employer]         Recent Visits Date Type Provider Dept  11/28/22 Procedure visit Edward Jolly, MD [facility]  10/23/22 Office Visit Edward Jolly, MD [facility]  09/24/22 Office Visit Edward Jolly, MD  [facility]  Showing recent visits within past 90 days and meeting all other requirements Today's Visits Date Type Provider Dept  12/04/22 Procedure visit Edward Jolly, MD [facility]  Showing today's visits and meeting all other requirements Future Appointments No visits were found meeting these conditions. Showing future appointments within next 90 days and meeting all other requirements  I discussed the assessment and treatment plan with the patient. The patient was provided an opportunity to ask questions and all were answered. The patient agreed with the plan and demonstrated an understanding of the instructions.  Patient advised to call back or seek an in-person evaluation if the symptoms or condition worsens.  Duration of encounter: 15 minutes.  Total time on encounter, as per AMA guidelines included both the face-to-face and non-face-to-face time personally spent by the physician and/or other qualified health care professional(s) on the day of the encounter (includes time in activities that require the physician or other qualified health care professional and does not include time in activities normally performed by clinical staff). Physician's time may include the following activities when performed: Preparing to see the patient (e.g., pre-charting review of records, searching for previously ordered imaging, lab work, and nerve conduction tests) Review of prior analgesic pharmacotherapies. Reviewing PMP Interpreting ordered tests (e.g., lab work, imaging, nerve conduction tests) Performing post-procedure evaluations, including interpretation of diagnostic procedures Obtaining and/or reviewing separately obtained history Performing a medically appropriate examination and/or evaluation Counseling and educating the patient/family/caregiver Ordering medications, tests, or procedures Referring and communicating with other health care professionals (when not  separately reported) Documenting clinical information in the electronic or other health record Independently interpreting results (not separately reported) and communicating results to the patient/ family/caregiver Care coordination (not separately reported)  Note by: Edward Jolly, MD Date: 12/04/2022; Time: 10:49 AM

## 2022-12-04 NOTE — Patient Instructions (Signed)

## 2022-12-17 NOTE — Progress Notes (Unsigned)
Referring Physician:  Edward Jolly, MD [address] [place],  [place] [address]  Primary Physician:  McCain, [place] Steven, DO  History of Present Illness: 12/18/2022 [name] is here today with a chief complaint of low back pain that radiates into the bilateral legs.  She developed severe neuropathic pain down her legs after undergoing chemotherapy for cancer.  She has not had any improvement in her symptoms over time.  She underwent spinal cord stimulator evaluation earlier this month.  This significantly helped her.  Her pain was approximately 60% better with her trial.  She has seen a pain psychologist and has been cleared for placement of a permanent device.  Past Surgery:  Removal of Peripheral Nerve Stimulator on 07/23/22 by Dr. Jackie Plum has no symptoms of cervical myelopathy.  The symptoms are causing a significant impact on the patient's life.   I have utilized the care everywhere function in epic to review the outside records available from external health systems.  Review of Systems:  A 10 point review of systems is negative, except for the pertinent positives and negatives detailed in the HPI.  Past Medical History: Past Medical History:  Diagnosis Date   Anxiety    Arthritis    Cancer    COVID-19 2021   Depression    GERD (gastroesophageal reflux disease)    Headache    Hypertension    Pneumonia     Past Surgical History: Past Surgical History:  Procedure Laterality Date   ABDOMINAL HYSTERECTOMY     APPENDECTOMY     BREAST BIOPSY Right 04/28/2021   U/s bx 7:00/2cmfn"heart" marker-INTRADUCTAL PAPILLOMA WITH APOCRINE METAPLASIA   CHOLECYSTECTOMY     28years ago   cyst removal from head Right 09/04/2022   DILATION AND CURETTAGE OF UTERUS     EXCISION OF BREAST BIOPSY Right 05/19/2021   Procedure: EXCISION OF BREAST BIOPSY w/ Radio frequency tag;  Surgeon: Carolan Shiver, MD;  Location: [facility];  Service: General;   Laterality: Right;   LAPAROSCOPIC BILATERAL SALPINGO OOPHERECTOMY Bilateral 07/06/2021   Procedure: LAPAROSCOPIC BILATERAL SALPINGO OOPHORECTOMY WITH CYSTOSCOPY;  Surgeon: Vena Austria, MD;  Location: [facility];  Service: Gynecology;  Laterality: Bilateral;   LYMPH GLAND EXCISION Right 01/22/2020   Procedure: CERVICAL LYMPH GLAND EXCISION;  Surgeon: Carolan Shiver, MD;  Location: [facility];  Service: General;  Laterality: Right;   PORTACATH PLACEMENT N/A 02/24/2020   Procedure: INSERTION PORT-A-CATH;  Surgeon: Carolan Shiver, MD;  Location: [facility];  Service: General;  Laterality: N/A;   TONSILLECTOMY      Allergies: Allergies as of 12/18/2022 - Review Complete 12/18/2022  Allergen Reaction Noted   Penicillins Rash 07/06/2015    Medications:  Current Outpatient Medications:    acetaminophen (TYLENOL) 500 MG tablet, Take 1 tablet (500 mg total) by mouth every 6 (six) hours as needed., Disp: 30 tablet, Rfl: 0   albuterol (VENTOLIN HFA) 108 (90 Base) MCG/ACT inhaler, Inhale into the lungs., Disp: , Rfl:    ALBUTEROL IN, Inhale into the lungs., Disp: , Rfl:    ALPRAZolam (XANAX) 0.5 MG tablet, Take 0.5 mg by mouth in the morning and at bedtime., Disp: , Rfl:    amLODipine (NORVASC) 5 MG tablet, Take 1 tablet by mouth once daily, Disp: 30 tablet, Rfl: 0   benzonatate (TESSALON) 100 MG capsule, Take 1 capsule (100 mg total) by mouth 3 (three) times daily as needed for cough., Disp: 30 capsule, Rfl: 0   cetirizine (ZYRTEC)  10 MG tablet, Take 10 mg by mouth daily as needed for allergies., Disp: , Rfl:    Cholecalciferol 50 MCG (2000 UT) TABS, Take 1 tablet by mouth daily., Disp: , Rfl:    escitalopram (LEXAPRO) 20 MG tablet, Take 20 mg by mouth at bedtime., Disp: , Rfl:    fluticasone (FLONASE) 50 MCG/ACT nasal spray, Place 2 sprays into both nostrils daily., Disp: 16 g, Rfl: 0   gabapentin (NEURONTIN) 300 MG capsule, Take 300 mg by mouth 3 (three) times daily., Disp: , Rfl:     hydrochlorothiazide (HYDRODIURIL) 25 MG tablet, Take 25 mg by mouth daily., Disp: , Rfl:    metoprolol succinate (TOPROL-XL) 25 MG 24 hr tablet, Take 12.5 mg by mouth daily., Disp: , Rfl:    omeprazole (PRILOSEC) 20 MG capsule, Take 20 mg by mouth daily., Disp: , Rfl:    pregabalin (LYRICA) 50 MG capsule, Take 1 capsule (50 mg total) by mouth 3 (three) times daily., Disp: 90 capsule, Rfl: 2   traMADol (ULTRAM) 50 MG tablet, Take 50 mg by mouth every 8 (eight) hours as needed., Disp: , Rfl:    traZODone (DESYREL) 100 MG tablet, Take 100 mg by mouth at bedtime., Disp: , Rfl:    zolpidem (AMBIEN CR) 6.25 MG CR tablet, Take 1 tablet (6.25 mg total) by mouth at bedtime as needed. for sleep, Disp: 30 tablet, Rfl: 0  Social History: Social History   Tobacco Use   Smoking status: Never   Smokeless tobacco: Never  Vaping Use   Vaping Use: Never used  Substance Use Topics   Alcohol use: Not Currently    Alcohol/week: 1.0 standard drink of alcohol    Types: 1 Glasses of wine per week   Drug use: Not Currently    Family Medical History: Family History  Problem Relation Age of Onset   Breast cancer Mother    Cancer Mother        unknown type    Physical Examination: There were no vitals filed for this visit.  General: Patient is well developed, well nourished, calm, collected, and in no apparent distress. Attention to examination is appropriate.  Neck:   Supple.  Full range of motion.  Respiratory: Patient is breathing without any difficulty.   NEUROLOGICAL:     Awake, alert, oriented to person, place, and time.  Speech is clear and fluent.   Cranial Nerves: Pupils equal round and reactive to light.  Facial tone is symmetric.  Facial sensation is symmetric. Shoulder shrug is symmetric. Tongue protrusion is midline.  There is no pronator drift.  ROM of spine: full.    Strength: Side Biceps Triceps Deltoid Interossei Grip Wrist Ext. Wrist Flex.  R 5 5 5 5 5 5 5   L 5 5 5 5 5 5 5     Side Iliopsoas Quads Hamstring PF DF EHL  R 5 5 5 5 5 5   L 5 5 5 5 5 5    Reflexes are 1+ and symmetric at the biceps, triceps, brachioradialis, patella and achilles.   Hoffman's is absent.   Bilateral upper and lower extremity sensation is intact to light touch.    No evidence of dysmetria noted.  Gait is normal.     Medical Decision Making  Imaging: MRI lumbar spine from March 2023 reviewed. IMPRESSION: Mild degenerative changes of the lumbar spine, more pronounced at L5-S1 where there is moderate right neural foraminal narrowing, impinging on the exiting right L5 nerve root.     Electronically  Signed   By: Baldemar Lenis M.D.   On: 11/06/2021 09:14 I have personally reviewed the images and agree with the above interpretation.  Assessment and Plan: Ms. Carr is a pleasant 67 y.o. female with intractable neuropathic pain in her legs as well as a chronic pain syndrome.  She has had a successful trial of spinal cord stimulation with 60% improvement in her pain.  I think she is an appropriate candidate for spinal cord stimulator placement.  2 ensure that there is no anatomic area of compression that would preclude placement of a permanent lead, I will obtain MRI scan of the thoracic spine before surgical intervention.  Then we will proceed with permanent implantation of a [employer] spinal cord stimulator.  I discussed the planned procedure at length with the patient, including the risks, benefits, alternatives, and indications. The risks discussed include but are not limited to bleeding, infection, need for reoperation, spinal fluid leak, stroke, vision loss, anesthetic complication, coma, paralysis, and even death. I also described in detail that improvement was not guaranteed.  The patient expressed understanding of these risks, and asked that we proceed with surgery. I described the surgery in layman's terms, and gave ample opportunity for questions, which  were answered to the best of my ability.     Thank you for involving me in the care of this patient.      Collins Dimaria K. Myer Haff MD, [facility]

## 2022-12-17 NOTE — H&P (View-Only) (Signed)
Referring Physician:  Edward Jolly, MD [address] [place],  [place] [address]  Primary Physician:  Lewis, Danielle Steven, DO  History of Present Illness: 12/18/2022 [name] is here today with a chief complaint of low back pain that radiates into the bilateral legs.  She developed severe neuropathic pain down her legs after undergoing chemotherapy for cancer.  She has not had any improvement in her symptoms over time.  She underwent spinal cord stimulator evaluation earlier this month.  This significantly helped her.  Her pain was approximately 60% better with her trial.  She has seen a pain psychologist and has been cleared for placement of a permanent device.  Past Surgery:  Removal of Peripheral Nerve Stimulator on 07/23/22 by Danielle Lewis has no symptoms of cervical myelopathy.  The symptoms are causing a significant impact on the patient's life.   I have utilized the care everywhere function in epic to review the outside records available from external health systems.  Review of Systems:  A 10 point review of systems is negative, except for the pertinent positives and negatives detailed in the HPI.  Past Medical History: Past Medical History:  Diagnosis Date   Anxiety    Arthritis    Cancer    COVID-19 2021   Depression    GERD (gastroesophageal reflux disease)    Headache    Hypertension    Pneumonia     Past Surgical History: Past Surgical History:  Procedure Laterality Date   ABDOMINAL HYSTERECTOMY     APPENDECTOMY     BREAST BIOPSY Right 04/28/2021   U/s bx 7:00/2cmfn"heart" marker-INTRADUCTAL PAPILLOMA WITH APOCRINE METAPLASIA   CHOLECYSTECTOMY     28years ago   cyst removal from head Right 09/04/2022   DILATION AND CURETTAGE OF UTERUS     EXCISION OF BREAST BIOPSY Right 05/19/2021   Procedure: EXCISION OF BREAST BIOPSY w/ Radio frequency tag;  Surgeon: Carolan Shiver, MD;  Location: [facility] OR[facility];  Service: General;   Laterality: Right;   LAPAROSCOPIC BILATERAL SALPINGO OOPHERECTOMY Bilateral 07/06/2021   Procedure: LAPAROSCOPIC BILATERAL SALPINGO OOPHORECTOMY WITH CYSTOSCOPY;  Surgeon: Danielle Austria, MD;  Location: [facility];  Service: Gynecology;  Laterality: Bilateral;   LYMPH GLAND EXCISION Right 01/22/2020   Procedure: CERVICAL LYMPH GLAND EXCISION;  Surgeon: Carolan Shiver, MD;  Location: [facility];  Service: General;  Laterality: Right;   PORTACATH PLACEMENT N/A 02/24/2020   Procedure: INSERTION PORT-A-CATH;  Surgeon: Carolan Shiver, MD;  Location: [facility];  Service: General;  Laterality: N/A;   TONSILLECTOMY      Allergies: Allergies as of 12/18/2022 - Review Complete 12/18/2022  Allergen Reaction Noted   Penicillins Rash 07/06/2015    Medications:  Current Outpatient Medications:    acetaminophen (TYLENOL) 500 MG tablet, Take 1 tablet (500 mg total) by mouth every 6 (six) hours as needed., Disp: 30 tablet, Rfl: 0   albuterol (VENTOLIN HFA) 108 (90 Base) MCG/ACT inhaler, Inhale into the lungs., Disp: , Rfl:    ALBUTEROL IN, Inhale into the lungs., Disp: , Rfl:    ALPRAZolam (XANAX) 0.5 MG tablet, Take 0.5 mg by mouth in the morning and at bedtime., Disp: , Rfl:    amLODipine (NORVASC) 5 MG tablet, Take 1 tablet by mouth once daily, Disp: 30 tablet, Rfl: 0   benzonatate (TESSALON) 100 MG capsule, Take 1 capsule (100 mg total) by mouth 3 (three) times daily as needed for cough., Disp: 30 capsule, Rfl: 0   cetirizine (ZYRTEC)  10 MG tablet, Take 10 mg by mouth daily as needed for allergies., Disp: , Rfl:    Cholecalciferol 50 MCG (2000 UT) TABS, Take 1 tablet by mouth daily., Disp: , Rfl:    escitalopram (LEXAPRO) 20 MG tablet, Take 20 mg by mouth at bedtime., Disp: , Rfl:    fluticasone (FLONASE) 50 MCG/ACT nasal spray, Place 2 sprays into both nostrils daily., Disp: 16 g, Rfl: 0   gabapentin (NEURONTIN) 300 MG capsule, Take 300 mg by mouth 3 (three) times daily., Disp: , Rfl:     hydrochlorothiazide (HYDRODIURIL) 25 MG tablet, Take 25 mg by mouth daily., Disp: , Rfl:    metoprolol succinate (TOPROL-XL) 25 MG 24 hr tablet, Take 12.5 mg by mouth daily., Disp: , Rfl:    omeprazole (PRILOSEC) 20 MG capsule, Take 20 mg by mouth daily., Disp: , Rfl:    pregabalin (LYRICA) 50 MG capsule, Take 1 capsule (50 mg total) by mouth 3 (three) times daily., Disp: 90 capsule, Rfl: 2   traMADol (ULTRAM) 50 MG tablet, Take 50 mg by mouth every 8 (eight) hours as needed., Disp: , Rfl:    traZODone (DESYREL) 100 MG tablet, Take 100 mg by mouth at bedtime., Disp: , Rfl:    zolpidem (AMBIEN CR) 6.25 MG CR tablet, Take 1 tablet (6.25 mg total) by mouth at bedtime as needed. for sleep, Disp: 30 tablet, Rfl: 0  Social History: Social History   Tobacco Use   Smoking status: Never   Smokeless tobacco: Never  Vaping Use   Vaping Use: Never used  Substance Use Topics   Alcohol use: Not Currently    Alcohol/week: 1.0 standard drink of alcohol    Types: 1 Glasses of wine per week   Drug use: Not Currently    Family Medical History: Family History  Problem Relation Age of Onset   Breast cancer Mother    Cancer Mother        unknown type    Physical Examination: There were no vitals filed for this visit.  General: Patient is well developed, well nourished, calm, collected, and in no apparent distress. Attention to examination is appropriate.  Neck:   Supple.  Full range of motion.  Respiratory: Patient is breathing without any difficulty.   NEUROLOGICAL:     Awake, alert, oriented to person, place, and time.  Speech is clear and fluent.   Cranial Nerves: Pupils equal round and reactive to light.  Facial tone is symmetric.  Facial sensation is symmetric. Shoulder shrug is symmetric. Tongue protrusion is midline.  There is no pronator drift.  ROM of spine: full.    Strength: Side Biceps Triceps Deltoid Interossei Grip Wrist Ext. Wrist Flex.  R 5 5 5 5 5 5 5   L 5 5 5 5 5 5 5     Side Iliopsoas Quads Hamstring PF DF EHL  R 5 5 5 5 5 5   L 5 5 5 5 5 5    Reflexes are 1+ and symmetric at the biceps, triceps, brachioradialis, patella and achilles.   Hoffman's is absent.   Bilateral upper and lower extremity sensation is intact to light touch.    No evidence of dysmetria noted.  Gait is normal.     Medical Decision Making  Imaging: MRI lumbar spine from March 2023 reviewed. IMPRESSION: Mild degenerative changes of the lumbar spine, more pronounced at L5-S1 where there is moderate right neural foraminal narrowing, impinging on the exiting right L5 nerve root.     Electronically  Signed   By: Baldemar Lenis M.D.   On: 11/06/2021 09:14 I have personally reviewed the images and agree with the above interpretation.  Assessment and Plan: Ms. Carr is a pleasant 67 y.o. female with intractable neuropathic pain in her legs as well as a chronic pain syndrome.  She has had a successful trial of spinal cord stimulation with 60% improvement in her pain.  I think she is an appropriate candidate for spinal cord stimulator placement.  2 ensure that there is no anatomic area of compression that would preclude placement of a permanent lead, I will obtain MRI scan of the thoracic spine before surgical intervention.  Then we will proceed with permanent implantation of a [employer] spinal cord stimulator.  I discussed the planned procedure at length with the patient, including the risks, benefits, alternatives, and indications. The risks discussed include but are not limited to bleeding, infection, need for reoperation, spinal fluid leak, stroke, vision loss, anesthetic complication, coma, paralysis, and even death. I also described in detail that improvement was not guaranteed.  The patient expressed understanding of these risks, and asked that we proceed with surgery. I described the surgery in layman's terms, and gave ample opportunity for questions, which  were answered to the best of my ability.     Thank you for involving me in the care of this patient.      Bria Portales K. Myer Haff MD, [facility] Neurosurgery

## 2022-12-18 ENCOUNTER — Encounter: Payer: Self-pay | Admitting: Neurosurgery

## 2022-12-18 ENCOUNTER — Other Ambulatory Visit: Payer: Self-pay

## 2022-12-18 ENCOUNTER — Ambulatory Visit (INDEPENDENT_AMBULATORY_CARE_PROVIDER_SITE_OTHER): Payer: Medicare (Managed Care) | Admitting: Neurosurgery

## 2022-12-18 VITALS — BP 128/80 | Ht 62.0 in | Wt 206.8 lb

## 2022-12-18 DIAGNOSIS — M792 Neuralgia and neuritis, unspecified: Secondary | ICD-10-CM

## 2022-12-18 DIAGNOSIS — G894 Chronic pain syndrome: Secondary | ICD-10-CM

## 2022-12-18 DIAGNOSIS — Z01818 Encounter for other preprocedural examination: Secondary | ICD-10-CM

## 2022-12-18 NOTE — Patient Instructions (Signed)
Please see below for information in regards to your upcoming surgery:   Planned surgery: thoracic laminectomy for spinal cord stimulator placement   Surgery date: 01/02/23 - you will find out your arrival time the business day before your surgery.   Pre-op appointment at [facility] Pre-admit Testing: we will call you with a date/time for this. Pre-admit testing is located on the first floor of the [facility] building, [address] [facility] [address]. Please bring all prescriptions in the original prescription bottles to your appointment, even if you have reviewed medications by phone with a pharmacy representative. Pre-op labs may be done at your pre-op appointment. You are not required to fast for these labs. Should you need to change your pre-op appointment, please call Pre-admit testing at [phone].     We can be reached by phone or mychart 8am-4pm, Monday-Friday. If you have any questions/concerns before or after surgery, you can reach [place] at [phone], or you can send a mychart message. If you have a concern after hours that cannot wait until normal business hours, you can call [phone] and ask to page the neurosurgeon on call for Lee.    Appointments/FMLA & disability paperwork: [employer]  Nurse: Royston Cowper  Medical assistants: Laurann Montana Physician Assistant's: Manning Charity & Drake Leach Surgeon: Venetia Night, MD

## 2022-12-20 ENCOUNTER — Encounter
Admission: RE | Admit: 2022-12-20 | Discharge: 2022-12-20 | Disposition: A | Discharge status: Home / Self Care | Payer: Medicare (Managed Care) | Source: Ambulatory Visit | Attending: Neurosurgery | Admitting: Neurosurgery

## 2022-12-20 ENCOUNTER — Other Ambulatory Visit: Payer: Self-pay

## 2022-12-20 VITALS — BP 126/55 | HR 58 | Resp 16 | Ht 62.0 in | Wt 205.2 lb

## 2022-12-20 DIAGNOSIS — Z01818 Encounter for other preprocedural examination: Secondary | ICD-10-CM | POA: Insufficient documentation

## 2022-12-20 DIAGNOSIS — I10 Essential (primary) hypertension: Secondary | ICD-10-CM | POA: Diagnosis not present

## 2022-12-20 DIAGNOSIS — Z79899 Other long term (current) drug therapy: Secondary | ICD-10-CM | POA: Insufficient documentation

## 2022-12-20 DIAGNOSIS — Z01812 Encounter for preprocedural laboratory examination: Secondary | ICD-10-CM

## 2022-12-20 DIAGNOSIS — E876 Hypokalemia: Secondary | ICD-10-CM | POA: Diagnosis not present

## 2022-12-20 HISTORY — DX: Left bundle-branch block, unspecified: I44.7

## 2022-12-20 HISTORY — DX: Anemia, unspecified: D64.9

## 2022-12-20 HISTORY — DX: Cerebral infarction, unspecified: I63.9

## 2022-12-20 HISTORY — DX: Dyspnea, unspecified: R06.00

## 2022-12-20 HISTORY — DX: Angina pectoris, unspecified: I20.9

## 2022-12-20 LAB — BASIC METABOLIC PANEL WITH GFR
Anion gap: 9 (ref 5–15)
BUN: 17 mg/dL (ref 8–23)
CO2: 25 mmol/L (ref 22–32)
Calcium: 9.4 mg/dL (ref 8.9–10.3)
Chloride: 104 mmol/L (ref 98–111)
Creatinine, Ser: 1.32 mg/dL — ABNORMAL HIGH (ref 0.44–1.00)
GFR, Estimated: 45 mL/min — ABNORMAL LOW (ref >60)
Glucose, Bld: 200 mg/dL — ABNORMAL HIGH (ref 70–99)
Potassium: 3 mmol/L — ABNORMAL LOW (ref 3.5–5.1)
Sodium: 138 mmol/L (ref 135–145)

## 2022-12-20 LAB — URINALYSIS, ROUTINE W REFLEX MICROSCOPIC
Bacteria, UA: NONE SEEN
Bilirubin Urine: NEGATIVE
Glucose, UA: NEGATIVE mg/dL
Ketones, ur: NEGATIVE mg/dL
Leukocytes,Ua: NEGATIVE
Nitrite: NEGATIVE
Protein, ur: NEGATIVE mg/dL
Specific Gravity, Urine: 1.017 (ref 1.005–1.030)
pH: 6 (ref 5.0–8.0)

## 2022-12-20 LAB — CBC
HCT: 41 % (ref 36.0–46.0)
Hemoglobin: 14.2 g/dL (ref 12.0–15.0)
MCH: 31.1 pg (ref 26.0–34.0)
MCHC: 34.6 g/dL (ref 30.0–36.0)
MCV: 89.7 fL (ref 80.0–100.0)
Platelets: 220 10*3/uL (ref 150–400)
RBC: 4.57 MIL/uL (ref 3.87–5.11)
RDW: 13.1 % (ref 11.5–15.5)
WBC: 6.7 10*3/uL (ref 4.0–10.5)
nRBC: 0 % (ref 0.0–0.2)

## 2022-12-20 LAB — TYPE AND SCREEN
ABO/RH(D): O POS
Antibody Screen: NEGATIVE

## 2022-12-20 LAB — SURGICAL PCR SCREEN
MRSA, PCR: NEGATIVE
Staphylococcus aureus: NEGATIVE

## 2022-12-20 MED ORDER — POTASSIUM CHLORIDE ER 10 MEQ PO TBCR
EXTENDED_RELEASE_TABLET | ORAL | 0 refills | Status: DC
Start: 2022-12-20 — End: 2023-02-14

## 2022-12-20 NOTE — Patient Instructions (Addendum)
Your procedure is scheduled on: 01/02/23 - Wednesday Report to the Registration Desk on the 1st floor of the [facility]. To find out your arrival time, please call [phone] between 1PM - 3PM on: 01/01/23 - Tuesday If your arrival time is 6:00 am, do not arrive before that time as the [facility] entrance doors do not open until 6:00 am.  REMEMBER: Instructions that are not followed completely may result in serious medical risk, up to and including death; or upon the discretion of your surgeon and anesthesiologist your surgery may need to be rescheduled.  Do not eat food after midnight the night before surgery.  No gum chewing or hard candies.  You may however, drink CLEAR liquids up to 2 hours before you are scheduled to arrive for your surgery. Do not drink anything within 2 hours of your scheduled arrival time.  Clear liquids include: - water  - apple juice without pulp - gatorade (not RED colors) - black coffee or tea (Do NOT add milk or creamers to the coffee or tea) Do NOT drink anything that is not on this list.   One week prior to surgery: Stop Anti-inflammatories (NSAIDS) such as Advil, Aleve, Ibuprofen, Motrin, Naproxen, Naprosyn and Aspirin based products such as Excedrin, Goody's Powder, BC Powder.  Stop ANY OVER THE COUNTER supplements until after surgery. 12/26/22.  You may take Tylenol if needed for pain up until the day of surgery.   TAKE ONLY THESE MEDICATIONS THE MORNING OF SURGERY WITH A SIP OF WATER:  omeprazole (PRILOSEC) - (take one the night before and one on the morning of surgery - helps to prevent nausea after surgery.) pregabalin (LYRICA)  ALPRAZolam (XANAX) if needed  Use albuterol (VENTOLIN HFA)  on the day of surgery and bring to the hospital.  No Alcohol for 24 hours before or after surgery.  No Smoking including e-cigarettes for 24 hours before surgery.  No chewable tobacco products for at least 6 hours before surgery.  No nicotine  patches on the day of surgery.  Do not use any "recreational" drugs for at least a week (preferably 2 weeks) before your surgery.  Please be advised that the combination of cocaine and anesthesia may have negative outcomes, up to and including death. If you test positive for cocaine, your surgery will be cancelled.  On the morning of surgery brush your teeth with toothpaste and water, you may rinse your mouth with mouthwash if you wish. Do not swallow any toothpaste or mouthwash.  Use CHG Soap or wipes as directed on instruction sheet.  Do not wear jewelry, make-up, hairpins, clips or nail polish.  Do not wear lotions, powders, or perfumes.   Do not shave body hair from the neck down 48 hours before surgery.  Contact lenses, hearing aids and dentures may not be worn into surgery.  Do not bring valuables to the hospital. [facility] is not responsible for any missing/lost belongings or valuables.   Notify your doctor if there is any change in your medical condition (cold, fever, infection).  Wear comfortable clothing (specific to your surgery type) to the hospital.  After surgery, you can help prevent lung complications by doing breathing exercises.  Take deep breaths and cough every 1-2 hours. Your doctor may order a device called an Incentive Spirometer to help you take deep breaths. When coughing or sneezing, hold a pillow firmly against your incision with both hands. This is called "splinting." Doing this helps protect your incision. It also decreases  belly discomfort.  If you are being admitted to the hospital overnight, leave your suitcase in the car. After surgery it may be brought to your room.  In case of increased patient census, it may be necessary for you, the patient, to continue your postoperative care in the Same Day Surgery department.  If you are being discharged the day of surgery, you will not be allowed to drive home. You will need a responsible individual to drive  you home and stay with you for 24 hours after surgery.   If you are taking public transportation, you will need to have a responsible individual with you.  Please call the Pre-admissions Testing Dept. at [phone] if you have any questions about these instructions.  Surgery Visitation Policy:  Patients having surgery or a procedure may have two visitors.  Children under the age of 16 must have an adult with them who is not the patient.  Inpatient Visitation:    Visiting hours are 7 a.m. to 8 p.m. Up to four visitors are allowed at one time in a patient room. The visitors may rotate out with other people during the day.  One visitor age 31 or older may stay with the patient overnight and must be in the room by 8 p.m.

## 2022-12-20 NOTE — Progress Notes (Addendum)
[facility] Perioperative Services: Pre-Admission/Anesthesia Testing  Abnormal Lab Notification and Treatment Plan of Care   Date: 12/20/22  Name: Danielle Lewis MRN:   [id number]  Re: Abnormal labs noted during PAT appointment   Notified:  Provider Name Provider Role Notification Mode  Venetia Night, MD Neurosurgery (Surgeon) Routed and/or faxed via Alger Simons, [facility] Primary Care Provider Routed and/or faxed via [facility]   Clinical Information and Notes:  ABNORMAL LAB VALUE(S): Lab Results  Component Value Date   K 3.0 (L) 12/20/2022   Danielle Lewis is scheduled for an elective THORACIC LAMINECTOMY FOR SPINAL CORD STIMULATOR PLACEMENT ([employer]) on 01/02/2023. In review of her medication reconciliation, it is noted that the patient is not taking prescribed diuretic medications..   Please note, in efforts to promote a safe and effective anesthetic course, per current guidelines/standards set by the [facility] anesthesia team, the minimal acceptable K+ level for the patient to proceed with general anesthesia is 3.0 mmol/L. With that being said, if the patient drops any lower, her elective procedure will need to be postponed until K+ is better optimized. In efforts to prevent case cancellation, will make efforts to optimize pre-surgical K+ level so that patient can safely undergo the planned surgical intervention.   Impression and Plan:  Danielle Lewis found to be HYPOkalemic at 3.0 mmol/L on preoperative labs. She is on not diuretic therapy or supplemental K+. Called patient to discuss results and plans for correction of noted electrolyte derangement. No answer; LMOM. Assume nutrition mediated issue, as patient does not take diuretics. Umsure about GI loss (diarrhea), as I have not been able to speak with patient, however she did not report this symptoms to the nursing staff. We have approximately 2 weeks before surgery. Given that patient is not on a  diuretic medication, will plan on slow replacement of her potassium as follows:  Meds ordered this encounter  Medications   potassium chloride (KLOR-CON) 10 MEQ tablet    Sig: Take 2 tablets (20 mEq) today, then 1 tablet (10 mEq) daily until surgery. Be sure to take dose on day of surgery. Follow up with PCP for repeat labs.    Dispense:  15 tablet    Refill:  0   Encouraged patient to follow up with PCP about 2-3 weeks postoperatively to have labs rechecked to ensure that levels are remaining within normal range. Discussed nutritional intake of K+ rich foods as an adjunctive way to keep her K+ levels normal; list of K+ rich foods verbally provided. Also mentioned ORS, however advised her not to rely solely on these drinks, as they are high in Na+, and she has a HTN diagnosis.   Will send copy of this note to surgeon and PCP to make them aware of K+ level and plans for correction. Order entered to recheck K+ on the day of her surgery to ensure optimization. Wished patient the best of luck with her upcoming surgery and subsequent recovery. She was encouraged to return call to the PAT clinic, or to her surgeon's office, should any questions or concerns arise between now and the time of her surgery. Patient was appreciative of the care/concern expressed by PAT staff.   Encounter Diagnoses: Pre-operative laboratory examination Hypokalemia  Quentin Mulling, MSN, APRN, FNP-C, CEN [facility]  Peri-operative Services Nurse Practitioner Phone: [phone] 12/20/22 3:20 PM  NOTE: This note has been prepared using Dragon dictation software. Despite my best ability to proofread, there is  always the potential that unintentional transcriptional errors may still occur from this process.

## 2022-12-25 ENCOUNTER — Encounter: Payer: Self-pay | Admitting: Family Medicine

## 2022-12-25 DIAGNOSIS — Z1231 Encounter for screening mammogram for malignant neoplasm of breast: Secondary | ICD-10-CM

## 2022-12-29 ENCOUNTER — Ambulatory Visit
Admission: RE | Admit: 2022-12-29 | Discharge: 2022-12-29 | Disposition: A | Discharge status: Home / Self Care | Payer: Medicare (Managed Care) | Source: Ambulatory Visit | Attending: Neurosurgery | Admitting: Neurosurgery

## 2022-12-29 DIAGNOSIS — M792 Neuralgia and neuritis, unspecified: Secondary | ICD-10-CM

## 2022-12-29 DIAGNOSIS — G894 Chronic pain syndrome: Secondary | ICD-10-CM

## 2023-01-02 ENCOUNTER — Encounter: Payer: Self-pay | Admitting: Neurosurgery

## 2023-01-02 ENCOUNTER — Telehealth: Payer: Self-pay

## 2023-01-02 ENCOUNTER — Ambulatory Visit: Payer: Medicare (Managed Care)

## 2023-01-02 ENCOUNTER — Ambulatory Visit: Payer: Medicare (Managed Care) | Admitting: Urgent Care

## 2023-01-02 ENCOUNTER — Ambulatory Visit
Admission: RE | Admit: 2023-01-02 | Discharge: 2023-01-02 | Disposition: A | Discharge status: Home / Self Care | Payer: Medicare (Managed Care) | Attending: Neurosurgery | Admitting: Neurosurgery

## 2023-01-02 ENCOUNTER — Encounter
Admission: RE | Disposition: A | Discharge status: Home / Self Care | Payer: Self-pay | Source: Home / Self Care | Attending: Neurosurgery

## 2023-01-02 ENCOUNTER — Other Ambulatory Visit: Payer: Self-pay

## 2023-01-02 DIAGNOSIS — Z79899 Other long term (current) drug therapy: Secondary | ICD-10-CM | POA: Diagnosis not present

## 2023-01-02 DIAGNOSIS — I4430 Unspecified atrioventricular block: Secondary | ICD-10-CM | POA: Insufficient documentation

## 2023-01-02 DIAGNOSIS — K219 Gastro-esophageal reflux disease without esophagitis: Secondary | ICD-10-CM | POA: Insufficient documentation

## 2023-01-02 DIAGNOSIS — E669 Obesity, unspecified: Secondary | ICD-10-CM | POA: Diagnosis not present

## 2023-01-02 DIAGNOSIS — I447 Left bundle-branch block, unspecified: Secondary | ICD-10-CM | POA: Diagnosis not present

## 2023-01-02 DIAGNOSIS — N183 Chronic kidney disease, stage 3 unspecified: Secondary | ICD-10-CM | POA: Diagnosis not present

## 2023-01-02 DIAGNOSIS — Z01818 Encounter for other preprocedural examination: Secondary | ICD-10-CM

## 2023-01-02 DIAGNOSIS — M47817 Spondylosis without myelopathy or radiculopathy, lumbosacral region: Secondary | ICD-10-CM | POA: Diagnosis not present

## 2023-01-02 DIAGNOSIS — M1990 Unspecified osteoarthritis, unspecified site: Secondary | ICD-10-CM | POA: Diagnosis not present

## 2023-01-02 DIAGNOSIS — M792 Neuralgia and neuritis, unspecified: Secondary | ICD-10-CM | POA: Diagnosis not present

## 2023-01-02 DIAGNOSIS — G894 Chronic pain syndrome: Secondary | ICD-10-CM | POA: Diagnosis not present

## 2023-01-02 DIAGNOSIS — F32A Depression, unspecified: Secondary | ICD-10-CM | POA: Insufficient documentation

## 2023-01-02 DIAGNOSIS — Z6837 Body mass index (BMI) 37.0-37.9, adult: Secondary | ICD-10-CM | POA: Diagnosis not present

## 2023-01-02 DIAGNOSIS — F419 Anxiety disorder, unspecified: Secondary | ICD-10-CM | POA: Insufficient documentation

## 2023-01-02 DIAGNOSIS — Z9221 Personal history of antineoplastic chemotherapy: Secondary | ICD-10-CM | POA: Diagnosis not present

## 2023-01-02 DIAGNOSIS — I129 Hypertensive chronic kidney disease with stage 1 through stage 4 chronic kidney disease, or unspecified chronic kidney disease: Secondary | ICD-10-CM | POA: Diagnosis not present

## 2023-01-02 HISTORY — PX: THORACIC LAMINECTOMY FOR SPINAL CORD STIMULATOR: SHX6887

## 2023-01-02 LAB — GLUCOSE, CAPILLARY: Glucose-Capillary: 142 mg/dL — ABNORMAL HIGH (ref 70–99)

## 2023-01-02 SURGERY — THORACIC LAMINECTOMY FOR SPINAL CORD STIMULATOR
Anesthesia: General | Site: Spine Thoracic

## 2023-01-02 MED ORDER — PROPOFOL 500 MG/50ML IV EMUL
INTRAVENOUS | Status: DC | PRN
  Administered 2023-01-02: 100 ug/kg/min via INTRAVENOUS

## 2023-01-02 MED ORDER — ROCURONIUM BROMIDE 10 MG/ML (PF) SYRINGE
PREFILLED_SYRINGE | INTRAVENOUS | Status: AC
  Filled 2023-01-02: qty 10

## 2023-01-02 MED ORDER — OXYCODONE HCL 5 MG/5ML PO SOLN: 5.0000 mg | Freq: Once | ORAL | Status: DC | PRN

## 2023-01-02 MED ORDER — VASOPRESSIN 20 UNIT/ML IV SOLN
INTRAVENOUS | Status: DC | PRN
  Administered 2023-01-02: 2 [IU] via INTRAVENOUS
  Administered 2023-01-02 (×2): 1 [IU] via INTRAVENOUS

## 2023-01-02 MED ORDER — CHLORHEXIDINE GLUCONATE 0.12 % MT SOLN
15.0000 mL | Freq: Once | OROMUCOSAL | Status: AC
  Administered 2023-01-02: 15 mL via OROMUCOSAL

## 2023-01-02 MED ORDER — MIDAZOLAM HCL 2 MG/2ML IJ SOLN
INTRAMUSCULAR | Status: AC
  Filled 2023-01-02: qty 2

## 2023-01-02 MED ORDER — DEXAMETHASONE SODIUM PHOSPHATE 10 MG/ML IJ SOLN
INTRAMUSCULAR | Status: DC | PRN
  Administered 2023-01-02: 10 mg via INTRAVENOUS

## 2023-01-02 MED ORDER — LIDOCAINE HCL (CARDIAC) PF 100 MG/5ML IV SOSY
PREFILLED_SYRINGE | INTRAVENOUS | Status: DC | PRN
  Administered 2023-01-02: 100 mg via INTRAVENOUS

## 2023-01-02 MED ORDER — OXYCODONE HCL 5 MG PO TABS: 5.0000 mg | ORAL_TABLET | Freq: Once | ORAL | Status: DC | PRN

## 2023-01-02 MED ORDER — CEFAZOLIN IN SODIUM CHLORIDE 2-0.9 GM/100ML-% IV SOLN
2.0000 g | Freq: Once | INTRAVENOUS | Status: DC
  Filled 2023-01-02: qty 100

## 2023-01-02 MED ORDER — LIDOCAINE HCL (PF) 2 % IJ SOLN
INTRAMUSCULAR | Status: AC
  Filled 2023-01-02: qty 10

## 2023-01-02 MED ORDER — SURGIRINSE WOUND IRRIGATION SYSTEM - OPTIME
TOPICAL | Status: DC | PRN
  Administered 2023-01-02: 450 mL via TOPICAL

## 2023-01-02 MED ORDER — LACTATED RINGERS IV SOLN: INTRAVENOUS | Status: DC

## 2023-01-02 MED ORDER — SODIUM CHLORIDE FLUSH 0.9 % IV SOLN
INTRAVENOUS | Status: AC
  Filled 2023-01-02: qty 20

## 2023-01-02 MED ORDER — OXYCODONE HCL 5 MG PO TABS: 5.0000 mg | ORAL_TABLET | ORAL | 0 refills | Status: DC | PRN

## 2023-01-02 MED ORDER — CEFAZOLIN SODIUM-DEXTROSE 2-4 GM/100ML-% IV SOLN
2.0000 g | INTRAVENOUS | Status: AC
  Administered 2023-01-02: 2 g via INTRAVENOUS

## 2023-01-02 MED ORDER — VANCOMYCIN HCL IN DEXTROSE 1-5 GM/200ML-% IV SOLN
INTRAVENOUS | Status: AC
  Filled 2023-01-02: qty 200

## 2023-01-02 MED ORDER — BUPIVACAINE HCL (PF) 0.5 % IJ SOLN
INTRAMUSCULAR | Status: AC
  Filled 2023-01-02: qty 30

## 2023-01-02 MED ORDER — PROPOFOL 10 MG/ML IV BOLUS
INTRAVENOUS | Status: AC
  Filled 2023-01-02: qty 60

## 2023-01-02 MED ORDER — EPINEPHRINE PF 1 MG/ML IJ SOLN
INTRAMUSCULAR | Status: AC
  Filled 2023-01-02: qty 1

## 2023-01-02 MED ORDER — ORAL CARE MOUTH RINSE: 15.0000 mL | Freq: Once | OROMUCOSAL | Status: AC

## 2023-01-02 MED ORDER — IPRATROPIUM-ALBUTEROL 0.5-2.5 (3) MG/3ML IN SOLN
RESPIRATORY_TRACT | Status: AC
  Filled 2023-01-02: qty 3

## 2023-01-02 MED ORDER — SUCCINYLCHOLINE CHLORIDE 200 MG/10ML IV SOSY
PREFILLED_SYRINGE | INTRAVENOUS | Status: DC | PRN
  Administered 2023-01-02: 100 mg via INTRAVENOUS

## 2023-01-02 MED ORDER — FAMOTIDINE 20 MG PO TABS
ORAL_TABLET | ORAL | Status: AC
  Filled 2023-01-02: qty 1

## 2023-01-02 MED ORDER — PHENYLEPHRINE HCL-NACL 20-0.9 MG/250ML-% IV SOLN
INTRAVENOUS | Status: DC | PRN
  Administered 2023-01-02: 25 ug/min via INTRAVENOUS

## 2023-01-02 MED ORDER — ACETAMINOPHEN 10 MG/ML IV SOLN: 1000.0000 mg | Freq: Once | INTRAVENOUS | Status: DC | PRN

## 2023-01-02 MED ORDER — ACETAMINOPHEN 10 MG/ML IV SOLN
INTRAVENOUS | Status: DC | PRN
  Administered 2023-01-02: 1000 mg via INTRAVENOUS

## 2023-01-02 MED ORDER — GLYCOPYRROLATE 0.2 MG/ML IJ SOLN
INTRAMUSCULAR | Status: AC
  Filled 2023-01-02: qty 1

## 2023-01-02 MED ORDER — IPRATROPIUM-ALBUTEROL 0.5-2.5 (3) MG/3ML IN SOLN
3.0000 mL | Freq: Once | RESPIRATORY_TRACT | Status: AC
  Administered 2023-01-02: 3 mL via RESPIRATORY_TRACT

## 2023-01-02 MED ORDER — REMIFENTANIL BOLUS VIA INFUSION OPTIME: INTRAVENOUS | Status: DC | PRN

## 2023-01-02 MED ORDER — FENTANYL CITRATE (PF) 100 MCG/2ML IJ SOLN
INTRAMUSCULAR | Status: AC
  Filled 2023-01-02: qty 2

## 2023-01-02 MED ORDER — ONDANSETRON HCL 4 MG/2ML IJ SOLN
INTRAMUSCULAR | Status: AC
  Filled 2023-01-02: qty 2

## 2023-01-02 MED ORDER — EPHEDRINE SULFATE (PRESSORS) 50 MG/ML IJ SOLN
INTRAMUSCULAR | Status: DC | PRN
  Administered 2023-01-02: 10 mg via INTRAVENOUS
  Administered 2023-01-02 (×4): 5 mg via INTRAVENOUS

## 2023-01-02 MED ORDER — REMIFENTANIL HCL 1 MG IV SOLR
INTRAVENOUS | Status: AC
  Filled 2023-01-02: qty 1000

## 2023-01-02 MED ORDER — DEXAMETHASONE SODIUM PHOSPHATE 10 MG/ML IJ SOLN
INTRAMUSCULAR | Status: AC
  Filled 2023-01-02: qty 1

## 2023-01-02 MED ORDER — CEFAZOLIN SODIUM-DEXTROSE 2-4 GM/100ML-% IV SOLN
INTRAVENOUS | Status: AC
  Filled 2023-01-02: qty 100

## 2023-01-02 MED ORDER — GLYCOPYRROLATE 0.2 MG/ML IJ SOLN
INTRAMUSCULAR | Status: DC | PRN
  Administered 2023-01-02: .2 mg via INTRAVENOUS

## 2023-01-02 MED ORDER — BUPIVACAINE LIPOSOME 1.3 % IJ SUSP
INTRAMUSCULAR | Status: AC
  Filled 2023-01-02: qty 20

## 2023-01-02 MED ORDER — VANCOMYCIN HCL IN DEXTROSE 1-5 GM/200ML-% IV SOLN
1000.0000 mg | Freq: Once | INTRAVENOUS | Status: AC
  Administered 2023-01-02: 1000 mg via INTRAVENOUS

## 2023-01-02 MED ORDER — ONDANSETRON HCL 4 MG/2ML IJ SOLN: 4.0000 mg | Freq: Once | INTRAMUSCULAR | Status: DC | PRN

## 2023-01-02 MED ORDER — SODIUM CHLORIDE 0.9 % IV SOLN
INTRAVENOUS | Status: DC | PRN
  Administered 2023-01-02: .05 ug/kg/min via INTRAVENOUS

## 2023-01-02 MED ORDER — FENTANYL CITRATE (PF) 100 MCG/2ML IJ SOLN
INTRAMUSCULAR | Status: DC | PRN
  Administered 2023-01-02: 100 ug via INTRAVENOUS

## 2023-01-02 MED ORDER — ALBUTEROL SULFATE HFA 108 (90 BASE) MCG/ACT IN AERS
INHALATION_SPRAY | RESPIRATORY_TRACT | Status: DC | PRN
  Administered 2023-01-02: 3 via RESPIRATORY_TRACT

## 2023-01-02 MED ORDER — ONDANSETRON HCL 4 MG/2ML IJ SOLN
INTRAMUSCULAR | Status: DC | PRN
  Administered 2023-01-02: 4 mg via INTRAVENOUS

## 2023-01-02 MED ORDER — SODIUM CHLORIDE (PF) 0.9 % IJ SOLN
INTRAMUSCULAR | Status: DC | PRN
  Administered 2023-01-02: 60 mL via INTRAMUSCULAR

## 2023-01-02 MED ORDER — METHOCARBAMOL 500 MG PO TABS: 500.0000 mg | ORAL_TABLET | Freq: Three times a day (TID) | ORAL | 0 refills | Status: DC | PRN

## 2023-01-02 MED ORDER — PROPOFOL 10 MG/ML IV BOLUS
INTRAVENOUS | Status: DC | PRN
  Administered 2023-01-02: 160 mg via INTRAVENOUS

## 2023-01-02 MED ORDER — BUPIVACAINE-EPINEPHRINE (PF) 0.5% -1:200000 IJ SOLN
INTRAMUSCULAR | Status: DC | PRN
  Administered 2023-01-02: 6 mL

## 2023-01-02 MED ORDER — ACETAMINOPHEN 10 MG/ML IV SOLN
INTRAVENOUS | Status: AC
  Filled 2023-01-02: qty 100

## 2023-01-02 MED ORDER — SURGIFLO WITH THROMBIN (HEMOSTATIC MATRIX KIT) OPTIME
TOPICAL | Status: DC | PRN
  Administered 2023-01-02: 1 via TOPICAL

## 2023-01-02 MED ORDER — PROPOFOL 1000 MG/100ML IV EMUL
INTRAVENOUS | Status: AC
  Filled 2023-01-02: qty 100

## 2023-01-02 MED ORDER — CHLORHEXIDINE GLUCONATE 0.12 % MT SOLN
OROMUCOSAL | Status: AC
  Filled 2023-01-02: qty 15

## 2023-01-02 MED ORDER — FENTANYL CITRATE (PF) 100 MCG/2ML IJ SOLN: 25.0000 ug | INTRAMUSCULAR | Status: DC | PRN

## 2023-01-02 MED ORDER — 0.9 % SODIUM CHLORIDE (POUR BTL) OPTIME
TOPICAL | Status: DC | PRN
  Administered 2023-01-02: 500 mL

## 2023-01-02 MED ORDER — SUCCINYLCHOLINE CHLORIDE 200 MG/10ML IV SOSY
PREFILLED_SYRINGE | INTRAVENOUS | Status: AC
  Filled 2023-01-02: qty 10

## 2023-01-02 MED ORDER — MIDAZOLAM HCL 2 MG/2ML IJ SOLN
INTRAMUSCULAR | Status: DC | PRN
  Administered 2023-01-02: 2 mg via INTRAVENOUS

## 2023-01-02 SURGICAL SUPPLY — 57 items
ADH SKN CLS APL DERMABOND .7 (GAUZE/BANDAGES/DRESSINGS) ×2
AGENT HMST KT MTR STRL THRMB (HEMOSTASIS) ×1
BUR NEURO DRILL SOFT 3.0X3.8M (BURR) ×1 IMPLANT
CONTROL REMOTE FREELINK ALPHA (NEUROSURGERY SUPPLIES) IMPLANT
CUP MEDICINE 2OZ PLAST GRAD ST (MISCELLANEOUS) ×1 IMPLANT
DERMABOND ADVANCED .7 DNX12 (GAUZE/BANDAGES/DRESSINGS) ×2 IMPLANT
DRAPE C ARM PK CFD 31 SPINE (DRAPES) ×1 IMPLANT
DRAPE LAPAROTOMY 100X77 ABD (DRAPES) ×1 IMPLANT
DRSG OPSITE POSTOP 3X4 (GAUZE/BANDAGES/DRESSINGS) IMPLANT
DRSG OPSITE POSTOP 4X6 (GAUZE/BANDAGES/DRESSINGS) IMPLANT
DRSG OPSITE POSTOP 4X8 (GAUZE/BANDAGES/DRESSINGS) IMPLANT
ELECT CAUTERY BLADE TIP 2.5 (TIP) ×1
ELECT REM PT RETURN 9FT ADLT (ELECTROSURGICAL) ×1
ELECTRODE CAUTERY BLDE TIP 2.5 (TIP) ×1 IMPLANT
ELECTRODE REM PT RTRN 9FT ADLT (ELECTROSURGICAL) ×1 IMPLANT
ELEVATER PASSER (SPINAL CORD STIMULATOR) ×1
FEE INTRAOP CADWELL SUPPLY NCS (MISCELLANEOUS) IMPLANT
FEE INTRAOP MONITOR IMPULS NCS (MISCELLANEOUS) IMPLANT
GLOVE BIOGEL PI IND STRL 6.5 (GLOVE) ×1 IMPLANT
GLOVE SURG SYN 6.5 ES PF (GLOVE) ×1 IMPLANT
GLOVE SURG SYN 6.5 PF PI (GLOVE) ×1 IMPLANT
GLOVE SURG SYN 8.5  E (GLOVE) ×3
GLOVE SURG SYN 8.5 E (GLOVE) ×3 IMPLANT
GLOVE SURG SYN 8.5 PF PI (GLOVE) ×3 IMPLANT
GOWN SRG LRG LVL 4 IMPRV REINF (GOWNS) ×1 IMPLANT
GOWN SRG XL LVL 3 NONREINFORCE (GOWNS) ×1 IMPLANT
GOWN STRL NON-REIN TWL XL LVL3 (GOWNS) ×1
GOWN STRL REIN LRG LVL4 (GOWNS) ×1
INTRAOP CADWELL SUPPLY FEE NCS (MISCELLANEOUS) ×1
INTRAOP DISP SUPPLY FEE NCS (MISCELLANEOUS) ×1
INTRAOP MONITOR FEE IMPULS NCS (MISCELLANEOUS) ×1
INTRAOP MONITOR FEE IMPULSE (MISCELLANEOUS) ×1
KIT CHARGING PRECISION NEURO (KITS) IMPLANT
KIT IPG ALPHA WAVEWRITER (Stimulator) IMPLANT
KIT SPINAL PRONEVIEW (KITS) ×1 IMPLANT
LEAD COVER EDGE 50CM STIM KIT (Lead) IMPLANT
MANIFOLD NEPTUNE II (INSTRUMENTS) ×1 IMPLANT
MARKER SKIN DUAL TIP RULER LAB (MISCELLANEOUS) ×1 IMPLANT
NDL SAFETY ECLIP 18X1.5 (MISCELLANEOUS) ×1 IMPLANT
NS IRRIG 1000ML POUR BTL (IV SOLUTION) ×1 IMPLANT
NS IRRIG 500ML POUR BTL (IV SOLUTION) IMPLANT
PACK LAMINECTOMY NEURO (CUSTOM PROCEDURE TRAY) ×1 IMPLANT
PASSER ELEVATOR (SPINAL CORD STIMULATOR) IMPLANT
SOLUTION IRRIG SURGIPHOR (IV SOLUTION) ×1 IMPLANT
STAPLER SKIN PROX 35W (STAPLE) ×1 IMPLANT
SURGIFLO W/THROMBIN 8M KIT (HEMOSTASIS) ×1 IMPLANT
SUT DVC VLOC 3-0 CL 6 P-12 (SUTURE) ×1 IMPLANT
SUT SILK 2 0SH CR/8 30 (SUTURE) ×1 IMPLANT
SUT V-LOC 90 ABS DVC 3-0 CL (SUTURE) IMPLANT
SUT VIC AB 0 CT1 18XCR BRD 8 (SUTURE) ×1 IMPLANT
SUT VIC AB 0 CT1 8-18 (SUTURE) ×2
SUT VIC AB 2-0 CT1 18 (SUTURE) ×1 IMPLANT
SYR 10ML LL (SYRINGE) ×1 IMPLANT
SYR 30ML LL (SYRINGE) ×2 IMPLANT
TOOL LONG TUNNEL (SPINAL CORD STIMULATOR) IMPLANT
TOWEL OR 17X26 4PK STRL BLUE (TOWEL DISPOSABLE) ×2 IMPLANT
TRAP FLUID SMOKE EVACUATOR (MISCELLANEOUS) ×1 IMPLANT

## 2023-01-02 NOTE — Anesthesia Preprocedure Evaluation (Addendum)
Anesthesia Evaluation  Patient identified by MRN, date of birth, ID band Patient awake    Reviewed: Allergy & Precautions, NPO status , Patient's Chart, lab work & pertinent test results  History of Anesthesia Complications Negative for: history of anesthetic complications  Airway Mallampati: IV   Neck ROM: Full    Dental  (+) Missing   Pulmonary neg pulmonary ROS   Pulmonary exam normal breath sounds clear to auscultation       Cardiovascular hypertension, Normal cardiovascular exam Rhythm:Regular Rate:Normal  ECG 12/20/22:  Sinus rhythm with 1st degree A-V block with occasional Premature ventricular complexes Left bundle branch block  Myocardial perfusion 12/15/21:  1.  Mildly reduced left ventricular function  2.  No regional wall motion abnormalities  3.  No evidence for scar or ischemia   Echo 11/23/21:  MILD LV SYSTOLIC DYSFUNCTION  NORMAL RIGHT VENTRICULAR SYSTOLIC FUNCTION  MILD VALVULAR REGURGITATION  NO VALVULAR STENOSIS  Focal inferoseptal hypokinesis    Neuro/Psych  Headaches PSYCHIATRIC DISORDERS Anxiety Depression    TIA (greater than 20 years ago)   GI/Hepatic ,GERD  ,,  Endo/Other  Obesity   Renal/GU Renal disease (stage III CKD)     Musculoskeletal  (+) Arthritis ,    Abdominal   Peds  Hematology negative hematology ROS (+)   Anesthesia Other Findings Cardiology note 05/16/22:  67 year old female with multiple cardiovascular risk factors, previous stroke, chronic left bundle branch block on ECG, with history of new onset chest pain, associated with shortness of breath. 2D echocardiogram 11/23/2021 revealed normal left ventricular function, with LVEF 50%. Lexiscan Myoview 12/15/2021 did not reveal evidence for scar or ischemia. She denies recurrent chest pain. She has chronic exertional dyspnea, likely secondary to obesity and deconditioning. Her chronic back pain and radiculopathy limits her  physical activities. She reports a 50-year history of palpitations. 72 hr Holter monitor revealed predominant sinus rhythm with occasional premature atrial contractions with one 8-beat atrial run. Patient has essential hypertension, blood pressure well controlled on current BP medications.   Plan   1. Trial of metoprolol succinate 12.5 mg daily 2. Continue low-sodium diet 3. Recommend limiting caffeine intake 4. Encouraged patient to call or send MyChart message if she does not tolerate metoprolol 5. Return to clinic for follow up in 6 months    Reproductive/Obstetrics                             Anesthesia Physical Anesthesia Plan  ASA: 3  Anesthesia Plan: General   Post-op Pain Management:    Induction: Intravenous  PONV Risk Score and Plan: 3 and Ondansetron, Dexamethasone and Treatment may vary due to age or medical condition  Airway Management Planned: Oral ETT  Additional Equipment:   Intra-op Plan:   Post-operative Plan: Extubation in OR  Informed Consent: I have reviewed the patients History and Physical, chart, labs and discussed the procedure including the risks, benefits and alternatives for the proposed anesthesia with the patient or authorized representative who has indicated his/her understanding and acceptance.     Dental advisory given  Plan Discussed with: CRNA  Anesthesia Plan Comments: (Patient consented for risks of anesthesia including but not limited to:  - adverse reactions to medications - damage to eyes, teeth, lips or other oral mucosa - nerve damage due to positioning  - sore throat or hoarseness - damage to heart, brain, nerves, lungs, other parts of body or loss of life  Informed patient about  role of CRNA in peri- and intra-operative care.  Patient voiced understanding.)        Anesthesia Quick Evaluation

## 2023-01-02 NOTE — Op Note (Signed)
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 The patient had a successful trial for spinal cord stimulation, so was consented for placement of a permanent device   Findings: successful placement of a [employer] spinal cord stimulator.    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     Preoperative Note:    Risks of surgery discussed in clinic.   Operative Note:      The patient was then brought from the preoperative center with intravenous access established.  The patient underwent general anesthesia and endotracheal tube intubation, then was rotated on the [facility] table where all pressure points were appropriately padded.  An incision was marked with flouroscopy at T9/10, and on the left flank. The skin was then thoroughly cleansed.  Perioperative antibiotic prophylaxis was administered.  Sterile prep and drapes were then applied and a timeout was then observed.     Once this was complete an incision was opened with the use of a #10 blade knife in the midline at the thoracic incision.  The paraspinus muscled were subperiosteally dissected until the laminae of T9 and T10 were visualized. Flouroscopy was used to confirm the level. A self-retaining retractor was placed.   The rongeur was used to remove the spinous process of T9.  The drill was used to thin the bone until the ligamentum flavum was visualized.  The ligamentum was then removed and the dura visualized. This was widened until placement of the paddle lead was possible.     The lead was then advanced to the T7/8 disc space at the top of the lead.  The lead was secured with a  2-0 silk suture.     The incision on the flank was then opened and a pocket formed until it was large enough for the pulse generator.  The tunneler was used to connect between the pocket and the incision.  The lead was inserted into the tunneler and tunneled to the buttock.  The leads were attached to the IPG and impedances checked.  The leads were then tightened.  The IPG was then inserted into the pouch.   Both sites were irrigated.  The wounds were closed in layers with 0 and 2-0 vicryl.  The skin was approximated with monocryl. A sterile dressing was applied.   Monitoring was stable throughout.   Patient was then rotated back to the preoperative bed awakened from anesthesia and taken to recovery. All counts are correct in this case.   I performed the entire procedure with the assistance of Manning Charity PA as an Designer, television/film set. An assistant was required for this procedure due to the complexity.  The assistant provided assistance in tissue manipulation and suction, and was required for the successful and safe performance of the procedure. I performed the critical portions of the procedure.      Venetia Night MD

## 2023-01-02 NOTE — Telephone Encounter (Signed)
Received authorization request from [employer] for methocarbamol. Key # [id number]. Urgent request has been submitted and is pending review.

## 2023-01-02 NOTE — Transfer of Care (Signed)
Immediate Anesthesia Transfer of Care Note  Patient: Danielle Lewis  Procedure(s) Performed: THORACIC LAMINECTOMY FOR SPINAL CORD STIMULATOR PLACEMENT ([employer]) (Spine Thoracic)  Patient Location: PACU  Anesthesia Type:General  Level of Consciousness: awake, alert , and oriented  Airway & Oxygen Therapy: Patient Spontanous Breathing and Patient connected to face mask oxygen  Post-op Assessment: Report given to RN and Post -op Vital signs reviewed and stable  Post vital signs: Reviewed and stable  Last Vitals:  Vitals Value Taken Time  BP 102/75 01/02/23 0941  Temp    Pulse 87 01/02/23 0942  Resp 18 01/02/23 0942  SpO2 88 % 01/02/23 0942  Vitals shown include unvalidated device data.  Last Pain:  Vitals:   01/02/23 0615  TempSrc: Temporal  PainSc: 6          Complications: No notable events documented.

## 2023-01-02 NOTE — Interval H&P Note (Signed)
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  The various methods of treatment have been discussed with the patient and family. After consideration of risks, benefits and other options for treatment, the patient has consented to  Procedure(s): THORACIC LAMINECTOMY FOR SPINAL CORD STIMULATOR PLACEMENT ([employer]) (N/A) as a surgical intervention.  The patient's history has been reviewed, patient examined, no change in status, stable for surgery.  I have reviewed the patient's chart and labs.  Questions were answered to the patient's satisfaction.    Heart sounds normal no MRG. Chest Clear to Auscultation Bilaterally.  Danielle Lewis

## 2023-01-02 NOTE — Anesthesia Procedure Notes (Signed)
Procedure Name: Intubation Date/Time: 01/02/2023 7:24 AM  Performed by: Malva Cogan, CRNAPre-anesthesia Checklist: Patient identified, Patient being monitored, Timeout performed, Emergency Drugs available and Suction available Patient Re-evaluated:Patient Re-evaluated prior to induction Oxygen Delivery Method: Circle system utilized Preoxygenation: Pre-oxygenation with 100% oxygen Induction Type: IV induction Ventilation: Mask ventilation without difficulty Laryngoscope Size: 3 and McGraph Grade View: Grade I Tube type: Oral Tube size: 6.5 mm Number of attempts: 1 Airway Equipment and Method: Stylet and Video-laryngoscopy Placement Confirmation: ETT inserted through vocal cords under direct vision, positive ETCO2 and breath sounds checked- equal and bilateral Secured at: 21 cm Tube secured with: Tape Dental Injury: Teeth and Oropharynx as per pre-operative assessment

## 2023-01-02 NOTE — Telephone Encounter (Signed)
Request is approved 12/03/22-01/02/24. Faxed approval back to [facility].

## 2023-01-02 NOTE — Discharge Summary (Signed)
Discharge Summary  Patient ID: Danielle Lewis MRN: [id number] DOB/AGE: 05-15-1956 67 y.o.  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 Her intraoperative course was uncomplicated. She was monitored in PACU and discharged home after ambulating, urinating, tolerating PO intake and meeting all discharge criteria.   Consults: None  Significant Diagnostic Studies: none  Treatments: surgery: As above. Please see separately dictated operative report for further details  Discharge Exam: Blood pressure 120/70, pulse 84, temperature 97.6 F (36.4 C), temperature source Tympanic, resp. rate 16, height 5\' 2"  (1.575 m), weight 93.1 kg, SpO2 91 %. CNII-XII grossly intact MAEW Incisions covered with clean post-op dressigns  Disposition: Discharge disposition: 01-Home or Self Care       Discharge Instructions     Diet - low sodium heart healthy   Complete by: As directed    Increase activity slowly   Complete by: As directed       Allergies as of 01/02/2023       Reactions   Penicillins Rash        Medication List     STOP taking these medications    pregabalin 50 MG capsule Commonly known as: Lyrica   traMADol 50 MG tablet Commonly known as: ULTRAM       TAKE these medications    albuterol 108 (90 Base) MCG/ACT inhaler Commonly known as: VENTOLIN HFA Inhale into the lungs.   ALPRAZolam 0.5 MG tablet Commonly known as: XANAX Take 0.5 mg by mouth 3 (three) times daily.   cetirizine 10 MG tablet Commonly known as: ZYRTEC Take 10 mg by mouth daily as needed for allergies.   Cholecalciferol 50  MCG (2000 UT) Tabs Take 1 tablet by mouth daily.   escitalopram 20 MG tablet Commonly known as: LEXAPRO Take 20 mg by mouth at bedtime.   fluticasone 50 MCG/ACT nasal spray Commonly known as: FLONASE Place 2 sprays into both nostrils daily. What changed:  when to take this reasons to take this   methocarbamol 500 MG tablet Commonly known as: ROBAXIN Take 1 tablet (500 mg total) by mouth every 8 (eight) hours as needed for muscle spasms.   metoprolol succinate 25 MG 24 hr tablet Commonly known as: TOPROL-XL Take 12.5 mg by mouth daily.   naproxen sodium 220 MG tablet Commonly known as: ALEVE Take 220 mg by mouth daily as needed.   omeprazole 20 MG capsule Commonly known as: PRILOSEC Take 20 mg by mouth daily.   oxyCODONE 5 MG immediate release tablet Commonly known as: Roxicodone Take 1 tablet (5 mg total) by mouth every 4 (four) hours as needed for up to 5 days for severe pain.   potassium chloride 10 MEQ tablet Commonly known as: KLOR-CON Take 2 tablets (20 mEq) today, then 1 tablet (10 mEq) daily until surgery. Be sure to take dose on day of surgery. Follow up with PCP for repeat labs.   traZODone 100 MG tablet Commonly known as: DESYREL Take 100 mg by mouth at bedtime.   zolpidem 6.25 MG CR tablet Commonly known as: AMBIEN CR Take 1 tablet (6.25 mg total) by mouth at bedtime as needed. for sleep  Follow-up Information     Drake Leach, PA-C Follow up on 01/17/2023.   Specialty: Neurosurgery Why: at 11:30 am Contact information: [address] [place] [address] [phone]                 Signed: Susanne Borders 01/02/2023, 4:15 PM

## 2023-01-02 NOTE — Discharge Instructions (Addendum)
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 Increase protein intake to promote wound healing.  Recommended activity: no lifting, driving, or strenuous exercise for 4 weeks . You should walk multiple times per day  Special Instructions  No straining, no heavy lifting > 10lbs x 4 weeks.  Keep incision area clean and dry. May shower in 2 days. No baths or pools for 6 weeks.  Please remove dressing tomorrow, no need to apply a bandage afterwards  You have no sutures to remove, the skin is closed with adhesive  Please take pain medications as directed. Take a stool softener if on pain medications   Please Report any of the following: Nausea or Vomiting, Temperature is greater than 101.32F (38.1C) degrees, Dizziness, Abdominal Pain, Difficulty Breathing or Shortness of Breath, Inability to Eat, drink Fluids, or Take medications, Bleeding, swelling, or drainage from surgical incision sites, New numbness or weakness, and Bowel or bladder dysfunction to the neurosurgeon on call at [phone]  Additional Follow up appointments Please follow up with Drake Leach PA-C as scheduled in 2-3 weeks   Please see below for scheduled appointments:  Future Appointments  Date Time Provider Department Center  01/17/2023 11:30 AM Drake Leach, PA-C [facility] None  01/30/2023  1:15 PM [facility] None  01/30/2023  1:30 PM Earna Coder, MD [facility] None  02/14/2023 11:45 AM Venetia Night, MD [facility] None   AMBULATORY SURGERY  DISCHARGE INSTRUCTIONS   The drugs that you were given will stay in your system until tomorrow so for the next 24 hours you should not:  Drive an automobile Make any legal decisions Drink any alcoholic beverage   You may  resume regular meals tomorrow.  Today it is better to start with liquids and gradually work up to solid foods.  You may eat anything you prefer, but it is better to start with liquids, then soup and crackers, and gradually work up to solid foods.   Please notify your doctor immediately if you have any unusual bleeding, trouble breathing, redness and pain at the surgery site, drainage, fever, or pain not relieved by medication.    Additional Instructions:   Please contact your physician with any problems or Same Day Surgery at [phone], Monday through Friday 6 am to 4 pm, or [place] at [facility] number at [phone].      AMBULATORY SURGERY  DISCHARGE INSTRUCTIONS   The drugs that you were given will stay in your system until tomorrow so for the next 24 hours you should not:  Drive an automobile Make any legal decisions Drink any alcoholic beverage   You may resume regular meals tomorrow.  Today it is better to start with liquids and gradually work up to solid foods.  You may eat anything you prefer, but it is better to start with liquids, then soup and crackers, and gradually work up to solid foods.   Please notify your doctor immediately if you have any unusual bleeding, trouble breathing, redness and pain at the surgery site, drainage, fever, or pain not relieved by medication.     Your post-operative visit with Dr.  is: Date:                        Time:    Please call to schedule your post-operative visit.  Additional Instructions:

## 2023-01-02 NOTE — Anesthesia Postprocedure Evaluation (Signed)
Anesthesia Post Note  Patient: Danielle Lewis  Procedure(s) Performed: THORACIC LAMINECTOMY FOR SPINAL CORD STIMULATOR PLACEMENT ([employer]) (Spine Thoracic)  Patient location during evaluation: PACU Anesthesia Type: General Level of consciousness: awake and alert, oriented and patient cooperative Pain management: pain level controlled Vital Signs Assessment: post-procedure vital signs reviewed and stable Respiratory status: spontaneous breathing, nonlabored ventilation and respiratory function stable Cardiovascular status: blood pressure returned to baseline and stable Postop Assessment: adequate PO intake Anesthetic complications: no   No notable events documented.   Last Vitals:  Vitals:   01/02/23 1115 01/02/23 1129  BP: 110/68 120/67  Pulse: 88 84  Resp: 17 16  Temp: (!) 36.2 C 36.4 C  SpO2: 91% 91%    Last Pain:  Vitals:   01/02/23 1129  TempSrc: Tympanic  PainSc: 3                  [name]

## 2023-01-03 ENCOUNTER — Telehealth: Payer: Self-pay | Admitting: Neurosurgery

## 2023-01-03 ENCOUNTER — Encounter: Payer: Self-pay | Admitting: Neurosurgery

## 2023-01-03 MED ORDER — HYDROCODONE-ACETAMINOPHEN 5-325 MG PO TABS: 1.0000 | ORAL_TABLET | Freq: Four times a day (QID) | ORAL | 0 refills | Status: AC | PRN

## 2023-01-03 NOTE — Telephone Encounter (Signed)
Notified the pt and advised to turn in the old oxycodone for disposal.

## 2023-01-03 NOTE — Telephone Encounter (Signed)
Sent Norco in due to patients concern for allergy to prescribed oxycodone.  We will have her bring the oxycodone prescription back to the pharmacy and see if they can dispose of it when she has to pick up her Norco.

## 2023-01-03 NOTE — Telephone Encounter (Signed)
Pt called in to say she believes the oxycodone she was prescribed post- op is making her itch and swell.   CB: [phone]

## 2023-01-03 NOTE — Telephone Encounter (Signed)
I spoke with Danielle Lewis. She reports the itching and swelling in her ankles and hands that started last night after taking the oxycodone.   She reports she hasn't tried benadryl because benadryl typically makes her hyper and has an opposite effect on her.  She would prefer to try another pain medication, but isn't sure which one hasn't caused issues in the past. She believes she tried Vicodin about 3 years ago without issue.  I added oxycodone and benadryl to her allergy list.  [facility] [place].

## 2023-01-09 ENCOUNTER — Other Ambulatory Visit: Payer: Self-pay | Admitting: Hematology and Oncology

## 2023-01-11 NOTE — Progress Notes (Unsigned)
   REFERRING PHYSICIAN:  [place], [place], [place] No address on file  DOS: 01/02/23 placement of [employer] SCS  HISTORY OF PRESENT ILLNESS: Danielle Lewis is approximately 2 weeks status post placement of [employer] SCS. Was given oxycodone and robaxin on discharge from the hospital.   She has soreness around her incisions. She has not had any leg cramping since the surgery. She feels like SCS is working as well as her trial did.   She is not taking the oxycodone or robaxin.    PHYSICAL EXAMINATION:  General: Patient is well developed, well nourished, calm, collected, and in no apparent distress.   NEUROLOGICAL:  General: In no acute distress.   Awake, alert, oriented to person, place, and time.  Pupils equal round and reactive to light.  Facial tone is symmetric.     Strength:            Side Iliopsoas Quads Hamstring PF DF EHL  R 5 5 5 5 5 5   L 5 5 5 5 5 5    Incisions c/d/i   ROS (Neurologic):  Negative except as noted above  IMAGING: Nothing new to review.   ASSESSMENT/PLAN:  Danielle DIGIANDOMENICO is doing well s/p above surgery. Treatment options reviewed with patient and following plan made:   - I have advised the patient to lift up to 10 pounds until 6 weeks after surgery (follow up with Dr. Myer Haff).  - Reviewed wound care.  - No bending, twisting, or lifting.  - Kristen with [employer] was in to do some reprogramming.  - Follow up as scheduled in 4 weeks and prn.   Advised to contact the office if any questions or concerns arise.  Drake Leach PA-C Department of neurosurgery

## 2023-01-17 ENCOUNTER — Ambulatory Visit (INDEPENDENT_AMBULATORY_CARE_PROVIDER_SITE_OTHER): Payer: Medicare (Managed Care) | Admitting: Orthopedic Surgery

## 2023-01-17 ENCOUNTER — Encounter: Payer: Self-pay | Admitting: Orthopedic Surgery

## 2023-01-17 VITALS — BP 120/74 | Ht 63.0 in | Wt 205.0 lb

## 2023-01-17 DIAGNOSIS — M792 Neuralgia and neuritis, unspecified: Secondary | ICD-10-CM

## 2023-01-17 DIAGNOSIS — Z09 Encounter for follow-up examination after completed treatment for conditions other than malignant neoplasm: Secondary | ICD-10-CM

## 2023-01-17 DIAGNOSIS — G894 Chronic pain syndrome: Secondary | ICD-10-CM

## 2023-01-17 DIAGNOSIS — Z9689 Presence of other specified functional implants: Secondary | ICD-10-CM

## 2023-01-30 ENCOUNTER — Inpatient Hospital Stay: Payer: Medicare (Managed Care) | Attending: Internal Medicine

## 2023-01-30 ENCOUNTER — Encounter: Payer: Self-pay | Admitting: Internal Medicine

## 2023-01-30 ENCOUNTER — Inpatient Hospital Stay (HOSPITAL_BASED_OUTPATIENT_CLINIC_OR_DEPARTMENT_OTHER): Payer: Medicare (Managed Care) | Admitting: Internal Medicine

## 2023-01-30 VITALS — BP 123/81 | HR 44 | Temp 98.0°F | Ht 63.0 in | Wt 205.0 lb

## 2023-01-30 DIAGNOSIS — C8218 Follicular lymphoma grade II, lymph nodes of multiple sites: Secondary | ICD-10-CM | POA: Diagnosis present

## 2023-01-30 DIAGNOSIS — Z79899 Other long term (current) drug therapy: Secondary | ICD-10-CM | POA: Diagnosis not present

## 2023-01-30 DIAGNOSIS — Z8673 Personal history of transient ischemic attack (TIA), and cerebral infarction without residual deficits: Secondary | ICD-10-CM | POA: Diagnosis not present

## 2023-01-30 DIAGNOSIS — E876 Hypokalemia: Secondary | ICD-10-CM | POA: Insufficient documentation

## 2023-01-30 DIAGNOSIS — R001 Bradycardia, unspecified: Secondary | ICD-10-CM | POA: Insufficient documentation

## 2023-01-30 DIAGNOSIS — R232 Flushing: Secondary | ICD-10-CM | POA: Diagnosis not present

## 2023-01-30 DIAGNOSIS — Z8616 Personal history of COVID-19: Secondary | ICD-10-CM | POA: Insufficient documentation

## 2023-01-30 DIAGNOSIS — I10 Essential (primary) hypertension: Secondary | ICD-10-CM | POA: Insufficient documentation

## 2023-01-30 DIAGNOSIS — Z0000 Encounter for general adult medical examination without abnormal findings: Secondary | ICD-10-CM | POA: Diagnosis not present

## 2023-01-30 DIAGNOSIS — K219 Gastro-esophageal reflux disease without esophagitis: Secondary | ICD-10-CM | POA: Diagnosis not present

## 2023-01-30 LAB — COMPREHENSIVE METABOLIC PANEL
ALT: 61 U/L — ABNORMAL HIGH (ref 0–44)
AST: 63 U/L — ABNORMAL HIGH (ref 15–41)
Albumin: 4.1 g/dL (ref 3.5–5.0)
Alkaline Phosphatase: 97 U/L (ref 38–126)
Anion gap: 10 (ref 5–15)
BUN: 18 mg/dL (ref 8–23)
CO2: 24 mmol/L (ref 22–32)
Calcium: 9.3 mg/dL (ref 8.9–10.3)
Chloride: 102 mmol/L (ref 98–111)
Creatinine, Ser: 1.26 mg/dL — ABNORMAL HIGH (ref 0.44–1.00)
GFR, Estimated: 47 mL/min — ABNORMAL LOW (ref >60)
Glucose, Bld: 184 mg/dL — ABNORMAL HIGH (ref 70–99)
Potassium: 3.3 mmol/L — ABNORMAL LOW (ref 3.5–5.1)
Sodium: 136 mmol/L (ref 135–145)
Total Bilirubin: 0.7 mg/dL (ref 0.3–1.2)
Total Protein: 7.4 g/dL (ref 6.5–8.1)

## 2023-01-30 LAB — CBC WITH DIFFERENTIAL/PLATELET
Abs Immature Granulocytes: 0.02 10*3/uL (ref 0.00–0.07)
Basophils Absolute: 0.1 10*3/uL (ref 0.0–0.1)
Basophils Relative: 1 %
Eosinophils Absolute: 0.2 10*3/uL (ref 0.0–0.5)
Eosinophils Relative: 3 %
HCT: 43.5 % (ref 36.0–46.0)
Hemoglobin: 15 g/dL (ref 12.0–15.0)
Immature Granulocytes: 0 %
Lymphocytes Relative: 37 %
Lymphs Abs: 2.5 10*3/uL (ref 0.7–4.0)
MCH: 30.4 pg (ref 26.0–34.0)
MCHC: 34.5 g/dL (ref 30.0–36.0)
MCV: 88.1 fL (ref 80.0–100.0)
Monocytes Absolute: 0.6 10*3/uL (ref 0.1–1.0)
Monocytes Relative: 9 %
Neutro Abs: 3.3 10*3/uL (ref 1.7–7.7)
Neutrophils Relative %: 50 %
Platelets: 197 10*3/uL (ref 150–400)
RBC: 4.94 MIL/uL (ref 3.87–5.11)
RDW: 13.5 % (ref 11.5–15.5)
WBC: 6.7 10*3/uL (ref 4.0–10.5)
nRBC: 0 % (ref 0.0–0.2)

## 2023-01-30 LAB — LACTATE DEHYDROGENASE: LDH: 153 U/L (ref 98–192)

## 2023-01-30 NOTE — Progress Notes (Signed)
Danielle Lewis CONSULT NOTE  Patient Care Team: [place], [place] Steven, DO as PCP - General (Family Medicine) Carolan Shiver, MD as Consulting Physician (General Surgery) Earna Coder, MD as Consulting Physician (Internal Medicine) Carolan Shiver, MD as Consulting Physician (General Surgery)  CHIEF COMPLAINTS/PURPOSE OF CONSULTATION:   Oncology History Overview Note  # MAY 2021- Bilateral neck adenopathy right more than left; highly concerning for lymphoproliferative disorder.  Excisional biopsy right neck- [Dr.Cintron];  A. 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   B. 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   # MAY 2021-CBC/CMP normal /  mildly elevated alkaline phosphatase. [PCP; [facility]].  # Peri Jefferson, 2021- [id number]  # SURVIVORSHIP:   # GENETICS:   DIAGNOSIS: Follicle lymphoma  STAGE: III        ;  GOALS: Control  CURRENT/MOST RECENT THERAPY : Bendamustine Rituxan [C]     Follicular lymphoma grade ii, lymph nodes of multiple sites (HCC)  01/29/2020 Initial Diagnosis   Follicular lymphoma grade ii, lymph nodes of multiple sites (HCC)   03/02/2020 - 08/25/2020 Chemotherapy   Patient is on Treatment Plan : NON-HODGKINS LYMPHOMA Rituximab D1 / Bendamustine D1,2 q28d      HISTORY OF PRESENTING ILLNESS: Alone.  Ambulating dependently.  [name] 67 y.o.  female  follicular lymphoma grade 1-2 currently s/p bendamustine Rituxan-currently on surveillance is here for follow-up.  Patient had a back stimulator placed 5 weeks ago. Complains of incisional pain.   Complains of chronic breast tenderness with any lumps. Continues to have hot flashes-however overall stable.   Review of Systems  Constitutional:  Positive for diaphoresis and malaise/fatigue. Negative for chills, fever and weight loss.  HENT:  Negative for nosebleeds and sore  throat.   Eyes:  Negative for double vision.  Respiratory:  Negative for cough, hemoptysis, sputum production, shortness of breath and wheezing.   Cardiovascular:  Negative for chest pain, palpitations, orthopnea and leg swelling.  Gastrointestinal:  Negative for abdominal pain, blood in stool, constipation, diarrhea, heartburn, melena, nausea and vomiting.  Genitourinary:  Negative for dysuria, frequency and urgency.  Musculoskeletal:  Positive for back pain and joint pain.  Skin: Negative.  Negative for itching and rash.  Neurological:  Negative for dizziness, tingling, focal weakness, weakness and headaches.  Endo/Heme/Allergies:  Does not bruise/bleed easily.  Psychiatric/Behavioral:  Negative for depression. The patient has insomnia. The patient is not nervous/anxious.      MEDICAL HISTORY:  Past Medical History:  Diagnosis Date   Anemia    Anginal pain (HCC)    Anxiety    Arthritis    Cancer (HCC)    COVID-19 2021   Depression    Dyspnea    GERD (gastroesophageal reflux disease)    Headache    Hypertension    Left bundle branch block    Pneumonia    Stroke [facility])     SURGICAL HISTORY: Past Surgical History:  Procedure Laterality Date   ABDOMINAL HYSTERECTOMY     APPENDECTOMY     BREAST BIOPSY Right 04/28/2021   U/s bx 7:00/2cmfn"heart" marker-INTRADUCTAL PAPILLOMA WITH APOCRINE METAPLASIA   CHOLECYSTECTOMY     28years ago   cyst removal from head Right 09/04/2022   DILATION AND CURETTAGE OF UTERUS     EXCISION OF BREAST BIOPSY Right 05/19/2021   Procedure: EXCISION OF BREAST BIOPSY w/ Radio frequency tag;  Surgeon: Carolan Shiver, MD;  Location: [facility];  Service: General;  Laterality: Right;   LAPAROSCOPIC BILATERAL SALPINGO OOPHERECTOMY Bilateral 07/06/2021   Procedure: LAPAROSCOPIC BILATERAL SALPINGO OOPHORECTOMY WITH CYSTOSCOPY;  Surgeon: Vena Austria, MD;  Location: [facility];  Service: Gynecology;  Laterality: Bilateral;   LYMPH GLAND EXCISION  Right 01/22/2020   Procedure: CERVICAL LYMPH GLAND EXCISION;  Surgeon: Carolan Shiver, MD;  Location: [facility];  Service: General;  Laterality: Right;   port a cath removed     PORTACATH PLACEMENT N/A 02/24/2020   Procedure: INSERTION PORT-A-CATH;  Surgeon: Carolan Shiver, MD;  Location: [facility];  Service: General;  Laterality: N/A;   THORACIC LAMINECTOMY FOR SPINAL CORD STIMULATOR N/A 01/02/2023   Procedure: THORACIC LAMINECTOMY FOR SPINAL CORD STIMULATOR PLACEMENT ([employer]);  Surgeon: Venetia Night, MD;  Location: [facility];  Service: Neurosurgery;  Laterality: N/A;   TONSILLECTOMY      SOCIAL HISTORY: Social History   Socioeconomic History   Marital status: Married    Spouse name: Mathis Fare   Number of children: Not on file   Years of education: Not on file   Highest education level: Not on file  Occupational History   Not on file  Tobacco Use   Smoking status: Never   Smokeless tobacco: Never  Vaping Use   Vaping Use: Never used  Substance and Sexual Activity   Alcohol use: Not Currently    Alcohol/week: 1.0 standard drink of alcohol    Types: 1 Glasses of wine per week   Drug use: Not Currently   Sexual activity: Yes    Birth control/protection: Surgical    Comment: Tubal  Other Topics Concern   Not on file  Social History Narrative   Lives in [place]; [place]. Never smoked; wine rarely. Used in work in [employer]. With husband.    Social Determinants of Health   Financial Resource Strain: Not on file  Food Insecurity: Not on file  Transportation Needs: Not on file  Physical Activity: Not on file  Stress: Not on file  Social Connections: Not on file  Intimate Partner Violence: Not on file    FAMILY HISTORY: Family History  Problem Relation Age of Onset   Breast cancer Mother    Cancer Mother        unknown type    ALLERGIES:  is allergic to benadryl [diphenhydramine], oxycodone, and penicillins.  MEDICATIONS:  Current  Outpatient Medications  Medication Sig Dispense Refill   albuterol (VENTOLIN HFA) 108 (90 Base) MCG/ACT inhaler Inhale into the lungs.     ALPRAZolam (XANAX) 0.5 MG tablet Take 0.5 mg by mouth 3 (three) times daily.     cetirizine (ZYRTEC) 10 MG tablet Take 10 mg by mouth daily as needed for allergies.     Cholecalciferol 50 MCG (2000 UT) TABS Take 1 tablet by mouth daily.     escitalopram (LEXAPRO) 20 MG tablet Take 20 mg by mouth at bedtime.     fluticasone (FLONASE) 50 MCG/ACT nasal spray Place 2 sprays into both nostrils daily. (Patient taking differently: Place 2 sprays into both nostrils as needed.) 16 g 0   methocarbamol (ROBAXIN) 500 MG tablet Take 1 tablet (500 mg total) by mouth every 8 (eight) hours as needed for muscle spasms. 90 tablet 0   metoprolol succinate (TOPROL-XL) 25 MG 24 hr tablet Take 12.5 mg by mouth daily.     naproxen sodium (ALEVE) 220 MG tablet Take 220 mg by mouth daily as needed.     omeprazole (PRILOSEC) 20 MG capsule Take 20  mg by mouth daily.     potassium chloride (KLOR-CON) 10 MEQ tablet Take 2 tablets (20 mEq) today, then 1 tablet (10 mEq) daily until surgery. Be sure to take dose on day of surgery. Follow up with PCP for repeat labs. 15 tablet 0   traZODone (DESYREL) 100 MG tablet Take 100 mg by mouth at bedtime.     zolpidem (AMBIEN CR) 6.25 MG CR tablet Take 1 tablet (6.25 mg total) by mouth at bedtime as needed. for sleep 30 tablet 0   No current facility-administered medications for this visit.      Marland Kitchen  PHYSICAL EXAMINATION: ECOG PERFORMANCE STATUS: 0 - Asymptomatic  Vitals:   01/30/23 1305  BP: 123/81  Pulse: (!) 44  Temp: 98 F (36.7 C)  SpO2: 97%    Filed Weights   01/30/23 1305  Weight: 205 lb (93 kg)     Physical Exam HENT:     Head: Normocephalic and atraumatic.     Mouth/Throat:     Pharynx: No oropharyngeal exudate.  Eyes:     Pupils: Pupils are equal, round, and reactive to light.  Neck:     Comments: Neck  lymphadenopathy improved. Cardiovascular:     Rate and Rhythm: Normal rate and regular rhythm.  Pulmonary:     Effort: Pulmonary effort is normal. No respiratory distress.     Breath sounds: Normal breath sounds. No wheezing.  Abdominal:     General: Bowel sounds are normal. There is no distension.     Palpations: Abdomen is soft. There is no mass.     Tenderness: There is no abdominal tenderness. There is no guarding or rebound.  Musculoskeletal:        General: No tenderness. Normal range of motion.     Cervical back: Normal range of motion and neck supple.  Skin:    General: Skin is warm.  Neurological:     Mental Status: She is alert and oriented to person, place, and time.  Psychiatric:        Mood and Affect: Affect normal.      LABORATORY DATA:  I have reviewed the data as listed Lab Results  Component Value Date   WBC 6.7 01/30/2023   HGB 15.0 01/30/2023   HCT 43.5 01/30/2023   MCV 88.1 01/30/2023   PLT 197 01/30/2023   Recent Labs    07/31/22 1412 12/20/22 1318 01/30/23 1308  NA 139 138 136  K 4.1 3.0* 3.3*  CL 105 104 102  CO2 26 25 24   GLUCOSE 93 200* 184*  BUN 18 17 18   CREATININE 1.29* 1.32* 1.26*  CALCIUM 9.5 9.4 9.3  GFRNONAA 46* 45* 47*  PROT 7.4  --  7.4  ALBUMIN 4.3  --  4.1  AST 53*  --  63*  ALT 48*  --  61*  ALKPHOS 69  --  97  BILITOT 0.8  --  0.7    RADIOGRAPHIC STUDIES: I have personally reviewed the radiological images as listed and agreed with the findings in the report. DG Thoracic Spine 2 View  Result Date: 01/02/2023 CLINICAL DATA:  Spinal cord stimulator insertion. EXAM: THORACIC SPINE 2 VIEWS COMPARISON:  Thoracic spine MRI 12/29/2022 FINDINGS: Four C-arm fluoroscopic images were obtained intraoperatively and submitted for post operative interpretation. The operative levels are difficult to discern due to the limited field of view. A spinal stimulator is in place on the final image with the tip terminating over the midline in  the region of  the T8 level. The leads appear intact. Fluoro time 8 seconds, dose 3.29 mGy. Please see the performing provider's procedural report for further detail. IMPRESSION: Intraoperative fluoroscopy during spinal cord stimulator insertion as above. Electronically Signed   By: Lesia Hausen M.D.   On: 01/02/2023 11:35   DG C-Arm 1-60 Min-No Report  Result Date: 01/02/2023 Fluoroscopy was utilized by the requesting physician.  No radiographic interpretation.    ASSESSMENT & PLAN:   Follicular lymphoma grade ii, lymph nodes of multiple sites (HCC) # Follicular lymphoma grade 1-2; at least stage III; s/p 6 cycles of Bendamustine Rituxan.  MAY 2023-CT scan neck chest abdomen pelvis-negative for any progressive adenopathy/or any concerns for new lymphadenopathy or lesions; Clinically evidence of recurrence.  Monitor for now clinically.  Will get a CT scan for surveillance next visit.  Ordered.  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  Recommend weight loss. Defer further evaluation with PCP.   # Right breast pain [May left breast- WNL]- due aug 2023-will order mammo   # Hypokalemia- [3.3]- recommend dietary recommendations./ follow up with PCP.   # Bradycardia: HR 44- ? Dizzy spells- recommend Holding Metoprolol until visit with PCP- June 10th.   # Creatinine- 1.3/GFR-42- [MAY 2023 ]; repeat GFR- 42. Defer further evaluation with PCP/Nephrology.   # port/IV access- explanted.   # DISPOSITION:  # Bilateral screening mammogram- ASAP  # follow up in 6 months;MD; labs- cbc/cmp/LDH; CT CAP prior- Dr.B   Cc; Dr.Linthavong/Ms. fields NP.     All questions were answered. The patient knows to call the clinic with any problems, questions or concerns.    Earna Coder, MD 01/30/2023 2:46 PM

## 2023-01-30 NOTE — Patient Instructions (Signed)
#   Given low heart rate- HR 44/ dizzy spells- recommend Holding Metoprolol until visit with PCP- June 10th.

## 2023-01-30 NOTE — Progress Notes (Signed)
Had a back stimulator placed 5 weeks ago.  C/o getting light headed often.

## 2023-01-30 NOTE — Assessment & Plan Note (Addendum)
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  MAY 2023-CT scan neck chest abdomen pelvis-negative for any progressive adenopathy/or any concerns for new lymphadenopathy or lesions; Clinically evidence of recurrence.  Monitor for now clinically.  Will get a CT scan for surveillance next visit.  Ordered.  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  Recommend weight loss. Defer further evaluation with PCP.   # Right breast pain [May left breast- WNL]- due aug 2023-will order mammo   # Hypokalemia- [3.3]- recommend dietary recommendations./ follow up with PCP.   # Bradycardia: HR 44- ? Dizzy spells- recommend Holding Metoprolol until visit with PCP- June 10th.   # Creatinine- 1.3/GFR-42- [MAY 2023 ]; repeat GFR- 42. Defer further evaluation with PCP/Nephrology.   # port/IV access- explanted.   # DISPOSITION:  # Bilateral screening mammogram- ASAP  # follow up in 6 months;MD; labs- cbc/cmp/LDH; CT CAP prior- Dr.B   Cc; Dr.Linthavong/Ms. fields NP.

## 2023-02-04 ENCOUNTER — Other Ambulatory Visit: Payer: Self-pay | Admitting: Family Medicine

## 2023-02-04 DIAGNOSIS — N6323 Unspecified lump in the left breast, lower outer quadrant: Secondary | ICD-10-CM

## 2023-02-04 DIAGNOSIS — N644 Mastodynia: Secondary | ICD-10-CM

## 2023-02-14 ENCOUNTER — Encounter: Payer: Self-pay | Admitting: Neurosurgery

## 2023-02-14 ENCOUNTER — Ambulatory Visit (INDEPENDENT_AMBULATORY_CARE_PROVIDER_SITE_OTHER): Payer: Medicare (Managed Care) | Admitting: Neurosurgery

## 2023-02-14 VITALS — BP 130/80 | Temp 98.3°F | Ht 63.0 in | Wt 205.0 lb

## 2023-02-14 DIAGNOSIS — Z9689 Presence of other specified functional implants: Secondary | ICD-10-CM

## 2023-02-14 DIAGNOSIS — Z09 Encounter for follow-up examination after completed treatment for conditions other than malignant neoplasm: Secondary | ICD-10-CM

## 2023-02-14 DIAGNOSIS — M792 Neuralgia and neuritis, unspecified: Secondary | ICD-10-CM

## 2023-02-14 DIAGNOSIS — G894 Chronic pain syndrome: Secondary | ICD-10-CM

## 2023-02-14 NOTE — Progress Notes (Signed)
   REFERRING PHYSICIAN:  No referring provider defined for this encounter.  DOS: 01/02/23 placement of [employer] SCS  HISTORY OF PRESENT ILLNESS: Danielle Lewis is status post placement of [employer] SCS.  She is doing extremely well.  Her pain is improved.  She has some mid back pain around her upper incision.      PHYSICAL EXAMINATION:  General: Patient is well developed, well nourished, calm, collected, and in no apparent distress.   NEUROLOGICAL:  General: In no acute distress.   Awake, alert, oriented to person, place, and time.  Pupils equal round and reactive to light.  Facial tone is symmetric.     Strength:            Side Iliopsoas Quads Hamstring PF DF EHL  R 5 5 5 5 5 5   L 5 5 5 5 5 5    Incisions c/d/i   ROS (Neurologic):  Negative except as noted above  IMAGING: Nothing new to review.   ASSESSMENT/PLAN:  Danielle Lewis is doing well s/p above surgery.  She will discuss reprogramming with the [employer] rep.  Her pain is much improved.  We reviewed her activity limitations.  She will slowly begin increasing her activity over time.  I will see her back on an as-needed basis.  Venetia Night Department of neurosurgery

## 2023-02-25 ENCOUNTER — Ambulatory Visit
Admission: RE | Admit: 2023-02-25 | Discharge: 2023-02-25 | Disposition: A | Discharge status: Home / Self Care | Payer: Medicare (Managed Care) | Source: Ambulatory Visit | Attending: Family Medicine | Admitting: Family Medicine

## 2023-02-25 DIAGNOSIS — N6323 Unspecified lump in the left breast, lower outer quadrant: Secondary | ICD-10-CM

## 2023-02-25 DIAGNOSIS — N644 Mastodynia: Secondary | ICD-10-CM

## 2023-04-30 ENCOUNTER — Ambulatory Visit: Payer: Medicare (Managed Care)

## 2023-06-25 ENCOUNTER — Encounter (HOSPITAL_COMMUNITY): Payer: Self-pay

## 2023-06-25 ENCOUNTER — Other Ambulatory Visit: Payer: Self-pay | Admitting: Nurse Practitioner

## 2023-06-25 DIAGNOSIS — Z8673 Personal history of transient ischemic attack (TIA), and cerebral infarction without residual deficits: Secondary | ICD-10-CM

## 2023-06-25 DIAGNOSIS — R079 Chest pain, unspecified: Secondary | ICD-10-CM

## 2023-06-25 DIAGNOSIS — I447 Left bundle-branch block, unspecified: Secondary | ICD-10-CM

## 2023-06-25 DIAGNOSIS — E782 Mixed hyperlipidemia: Secondary | ICD-10-CM

## 2023-06-27 ENCOUNTER — Ambulatory Visit
Admission: RE | Admit: 2023-06-27 | Discharge: 2023-06-27 | Disposition: A | Discharge status: Home / Self Care | Payer: Medicare (Managed Care) | Source: Ambulatory Visit | Attending: Nurse Practitioner | Admitting: Nurse Practitioner

## 2023-06-27 DIAGNOSIS — I447 Left bundle-branch block, unspecified: Secondary | ICD-10-CM

## 2023-06-27 DIAGNOSIS — E782 Mixed hyperlipidemia: Secondary | ICD-10-CM | POA: Diagnosis present

## 2023-06-27 DIAGNOSIS — Z8673 Personal history of transient ischemic attack (TIA), and cerebral infarction without residual deficits: Secondary | ICD-10-CM | POA: Diagnosis present

## 2023-06-27 DIAGNOSIS — R079 Chest pain, unspecified: Secondary | ICD-10-CM | POA: Diagnosis present

## 2023-06-27 MED ORDER — DILTIAZEM HCL 25 MG/5ML IV SOLN
10.0000 mg | INTRAVENOUS | Status: DC | PRN
  Administered 2023-06-27: 5 mg via INTRAVENOUS

## 2023-06-27 MED ORDER — SODIUM CHLORIDE 0.9 % IV BOLUS
150.0000 mL | Freq: Once | INTRAVENOUS | Status: AC
  Administered 2023-06-27: 150 mL via INTRAVENOUS

## 2023-06-27 MED ORDER — NITROGLYCERIN 0.4 MG SL SUBL
0.8000 mg | SUBLINGUAL_TABLET | Freq: Once | SUBLINGUAL | Status: AC
Start: 2023-06-27 — End: 2023-06-27
  Administered 2023-06-27: 0.8 mg via SUBLINGUAL

## 2023-06-27 MED ORDER — IOHEXOL 350 MG/ML SOLN
100.0000 mL | Freq: Once | INTRAVENOUS | Status: AC | PRN
  Administered 2023-06-27: 100 mL via INTRAVENOUS

## 2023-06-27 MED ORDER — METOPROLOL TARTRATE 5 MG/5ML IV SOLN
10.0000 mg | INTRAVENOUS | Status: DC | PRN
  Administered 2023-06-27: 10 mg via INTRAVENOUS

## 2023-06-27 NOTE — Progress Notes (Signed)
Patient tolerated procedure well. Ambulate w/o difficulty. Denies any lightheadedness or being dizzy. Pt denies any pain at this time. Sitting in chair, drinking water provided. P is encouraged to drink additional water throughout the day and reason explained to patient. Patient verbalized understanding and all questions answered. ABC intact. No further needs at this time. Discharge from procedure area w/o issues.  °

## 2023-07-04 ENCOUNTER — Inpatient Hospital Stay: Admission: RE | Admit: 2023-07-04 | Payer: Medicare (Managed Care) | Source: Ambulatory Visit

## 2023-07-29 ENCOUNTER — Ambulatory Visit
Admission: RE | Admit: 2023-07-29 | Discharge: 2023-07-29 | Disposition: A | Discharge status: Home / Self Care | Payer: Medicare (Managed Care) | Source: Ambulatory Visit | Attending: Internal Medicine | Admitting: Internal Medicine

## 2023-07-29 DIAGNOSIS — C8218 Follicular lymphoma grade II, lymph nodes of multiple sites: Secondary | ICD-10-CM | POA: Diagnosis present

## 2023-07-29 MED ORDER — BARIUM SULFATE 2 % PO SUSP
450.0000 mL | ORAL | Status: AC
  Administered 2023-07-29 (×2): 450 mL via ORAL

## 2023-07-29 MED ORDER — IOHEXOL 300 MG/ML  SOLN
100.0000 mL | Freq: Once | INTRAMUSCULAR | Status: AC | PRN
  Administered 2023-07-29: 100 mL via INTRAVENOUS

## 2023-07-31 ENCOUNTER — Other Ambulatory Visit: Payer: Medicare (Managed Care)

## 2023-07-31 ENCOUNTER — Ambulatory Visit: Payer: Medicare (Managed Care) | Admitting: Internal Medicine

## 2023-08-05 ENCOUNTER — Inpatient Hospital Stay (HOSPITAL_BASED_OUTPATIENT_CLINIC_OR_DEPARTMENT_OTHER): Payer: Medicare (Managed Care) | Admitting: Internal Medicine

## 2023-08-05 ENCOUNTER — Encounter: Payer: Self-pay | Admitting: Internal Medicine

## 2023-08-05 ENCOUNTER — Inpatient Hospital Stay: Payer: Medicare (Managed Care) | Attending: Internal Medicine

## 2023-08-05 VITALS — BP 122/82 | HR 76 | Temp 98.0°F | Ht 63.0 in | Wt 198.4 lb

## 2023-08-05 DIAGNOSIS — C8218 Follicular lymphoma grade II, lymph nodes of multiple sites: Secondary | ICD-10-CM

## 2023-08-05 DIAGNOSIS — R7401 Elevation of levels of liver transaminase levels: Secondary | ICD-10-CM | POA: Insufficient documentation

## 2023-08-05 DIAGNOSIS — N183 Chronic kidney disease, stage 3 unspecified: Secondary | ICD-10-CM | POA: Insufficient documentation

## 2023-08-05 DIAGNOSIS — I493 Ventricular premature depolarization: Secondary | ICD-10-CM | POA: Insufficient documentation

## 2023-08-05 LAB — CBC WITH DIFFERENTIAL (CANCER CENTER ONLY)
Abs Immature Granulocytes: 0.02 10*3/uL (ref 0.00–0.07)
Basophils Absolute: 0 10*3/uL (ref 0.0–0.1)
Basophils Relative: 0 %
Eosinophils Absolute: 0.1 10*3/uL (ref 0.0–0.5)
Eosinophils Relative: 2 %
HCT: 44.8 % (ref 36.0–46.0)
Hemoglobin: 15.5 g/dL — ABNORMAL HIGH (ref 12.0–15.0)
Immature Granulocytes: 0 %
Lymphocytes Relative: 34 %
Lymphs Abs: 2.5 10*3/uL (ref 0.7–4.0)
MCH: 31.4 pg (ref 26.0–34.0)
MCHC: 34.6 g/dL (ref 30.0–36.0)
MCV: 90.9 fL (ref 80.0–100.0)
Monocytes Absolute: 0.5 10*3/uL (ref 0.1–1.0)
Monocytes Relative: 7 %
Neutro Abs: 4.1 10*3/uL (ref 1.7–7.7)
Neutrophils Relative %: 57 %
Platelet Count: 209 10*3/uL (ref 150–400)
RBC: 4.93 MIL/uL (ref 3.87–5.11)
RDW: 13.6 % (ref 11.5–15.5)
WBC Count: 7.2 10*3/uL (ref 4.0–10.5)
nRBC: 0 % (ref 0.0–0.2)

## 2023-08-05 LAB — CMP (CANCER CENTER ONLY)
ALT: 45 U/L — ABNORMAL HIGH (ref 0–44)
AST: 59 U/L — ABNORMAL HIGH (ref 15–41)
Albumin: 4.2 g/dL (ref 3.5–5.0)
Alkaline Phosphatase: 87 U/L (ref 38–126)
Anion gap: 10 (ref 5–15)
BUN: 18 mg/dL (ref 8–23)
CO2: 21 mmol/L — ABNORMAL LOW (ref 22–32)
Calcium: 9.2 mg/dL (ref 8.9–10.3)
Chloride: 105 mmol/L (ref 98–111)
Creatinine: 1.09 mg/dL — ABNORMAL HIGH (ref 0.44–1.00)
GFR, Estimated: 56 mL/min — ABNORMAL LOW (ref >60)
Glucose, Bld: 194 mg/dL — ABNORMAL HIGH (ref 70–99)
Potassium: 3.5 mmol/L (ref 3.5–5.1)
Sodium: 136 mmol/L (ref 135–145)
Total Bilirubin: 1.2 mg/dL — ABNORMAL HIGH (ref <1.2)
Total Protein: 7.5 g/dL (ref 6.5–8.1)

## 2023-08-05 LAB — LACTATE DEHYDROGENASE: LDH: 156 U/L (ref 98–192)

## 2023-08-05 NOTE — Progress Notes (Signed)
[facility] CONSULT NOTE  Patient Care Team: Alm Bustard, NP as PCP - General (Family Medicine) Carolan Shiver, MD as Consulting Physician (General Surgery) Earna Coder, MD as Consulting Physician (Internal Medicine) Carolan Shiver, MD as Consulting Physician (General Surgery)  CHIEF COMPLAINTS/PURPOSE OF CONSULTATION:   Oncology History Overview Note  # MAY 2021- Bilateral neck adenopathy right more than left; highly concerning for lymphoproliferative disorder.  Excisional biopsy right neck- [Dr.Cintron];  A. 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   B. 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   # MAY 2021-CBC/CMP normal /  mildly elevated alkaline phosphatase. [PCP; [facility]].  # Peri Jefferson, 2021- [id number]  # SURVIVORSHIP:   # GENETICS:   DIAGNOSIS: Follicle lymphoma  STAGE: III        ;  GOALS: Control  CURRENT/MOST RECENT THERAPY : Bendamustine Rituxan [C]     Follicular lymphoma grade ii, lymph nodes of multiple sites (HCC)  01/29/2020 Initial Diagnosis   Follicular lymphoma grade ii, lymph nodes of multiple sites (HCC)   03/02/2020 - 08/25/2020 Chemotherapy   Patient is on Treatment Plan : NON-HODGKINS LYMPHOMA Rituximab D1 / Bendamustine D1,2 q28d      HISTORY OF PRESENTING ILLNESS: Alone.  Ambulating dependently.  [name] 67 y.o.  female  follicular lymphoma grade 1-2 currently s/p bendamustine Rituxan-currently on surveillance is here for follow-up/and review results of the CT scan.  Patient denies any new lumps or bumps.  Denies any weight loss.  Chronic hot flashes not any worse.  Continues to have chronic back pain not significantly improved after stimulator placement.  Review of Systems  Constitutional:  Positive for diaphoresis and malaise/fatigue. Negative for chills, fever and weight loss.  HENT:   Negative for nosebleeds and sore throat.   Eyes:  Negative for double vision.  Respiratory:  Negative for cough, hemoptysis, sputum production, shortness of breath and wheezing.   Cardiovascular:  Negative for chest pain, palpitations, orthopnea and leg swelling.  Gastrointestinal:  Negative for abdominal pain, blood in stool, constipation, diarrhea, heartburn, melena, nausea and vomiting.  Genitourinary:  Negative for dysuria, frequency and urgency.  Musculoskeletal:  Positive for back pain and joint pain.  Skin: Negative.  Negative for itching and rash.  Neurological:  Negative for dizziness, tingling, focal weakness, weakness and headaches.  Endo/Heme/Allergies:  Does not bruise/bleed easily.  Psychiatric/Behavioral:  Negative for depression. The patient has insomnia. The patient is not nervous/anxious.      MEDICAL HISTORY:  Past Medical History:  Diagnosis Date   Anemia    Anginal pain (HCC)    Anxiety    Arthritis    Cancer (HCC)    COVID-19 2021   Depression    Dyspnea    GERD (gastroesophageal reflux disease)    Headache    Hypertension    Left bundle branch block    Pneumonia    Stroke [facility])     SURGICAL HISTORY: Past Surgical History:  Procedure Laterality Date   ABDOMINAL HYSTERECTOMY     APPENDECTOMY     BREAST BIOPSY Right 04/28/2021   U/s bx 7:00/2cmfn"heart" marker-INTRADUCTAL PAPILLOMA WITH APOCRINE METAPLASIA   CHOLECYSTECTOMY     28years ago   cyst removal from head Right 09/04/2022   DILATION AND CURETTAGE OF UTERUS     EXCISION OF BREAST BIOPSY Right 05/19/2021   Procedure: EXCISION OF BREAST BIOPSY w/ Radio  frequency tag;  Surgeon: Carolan Shiver, MD;  Location: [facility] ORS;  Service: General;  Laterality: Right;   LAPAROSCOPIC BILATERAL SALPINGO OOPHERECTOMY Bilateral 07/06/2021   Procedure: LAPAROSCOPIC BILATERAL SALPINGO OOPHORECTOMY WITH CYSTOSCOPY;  Surgeon: Vena Austria, MD;  Location: [facility] ORS;  Service: Gynecology;  Laterality:  Bilateral;   LYMPH GLAND EXCISION Right 01/22/2020   Procedure: CERVICAL LYMPH GLAND EXCISION;  Surgeon: Carolan Shiver, MD;  Location: [facility] ORS;  Service: General;  Laterality: Right;   port a cath removed     PORTACATH PLACEMENT N/A 02/24/2020   Procedure: INSERTION PORT-A-CATH;  Surgeon: Carolan Shiver, MD;  Location: [facility] ORS;  Service: General;  Laterality: N/A;   THORACIC LAMINECTOMY FOR SPINAL CORD STIMULATOR N/A 01/02/2023   Procedure: THORACIC LAMINECTOMY FOR SPINAL CORD STIMULATOR PLACEMENT ([employer]);  Surgeon: Venetia Night, MD;  Location: [facility] ORS;  Service: Neurosurgery;  Laterality: N/A;   TONSILLECTOMY      SOCIAL HISTORY: Social History   Socioeconomic History   Marital status: Married    Spouse name: Mathis Fare   Number of children: Not on file   Years of education: Not on file   Highest education level: Not on file  Occupational History   Not on file  Tobacco Use   Smoking status: Never   Smokeless tobacco: Never  Vaping Use   Vaping status: Never Used  Substance and Sexual Activity   Alcohol use: Not Currently    Alcohol/week: 1.0 standard drink of alcohol    Types: 1 Glasses of wine per week   Drug use: Not Currently   Sexual activity: Yes    Birth control/protection: Surgical    Comment: Tubal  Other Topics Concern   Not on file  Social History Narrative   Lives in [place]; [place]. Never smoked; wine rarely. Used in work in [employer]. With husband.    Social Determinants of Health   Financial Resource Strain: Low Risk  (07/22/2023)   Received from [facility]   Overall Financial Resource Strain (CARDIA)    Difficulty of Paying Living Expenses: Not hard at all  Food Insecurity: No Food Insecurity (07/22/2023)   Received from [facility]   Hunger Vital Sign    Worried About Running Out of Food in the Last Year: Never true    Ran Out of Food in the Last Year: Never true  Transportation  Needs: No Transportation Needs (07/22/2023)   Received from [facility] - Transportation    In the past 12 months, has lack of transportation kept you from medical appointments or from getting medications?: No    Lack of Transportation (Non-Medical): No  Physical Activity: Not on file  Stress: Not on file  Social Connections: Not on file  Intimate Partner Violence: Not on file    FAMILY HISTORY: Family History  Problem Relation Age of Onset   Breast cancer Mother    Cancer Mother        unknown type    ALLERGIES:  is allergic to benadryl [diphenhydramine], oxycodone, and penicillins.  MEDICATIONS:  Current Outpatient Medications  Medication Sig Dispense Refill   ALPRAZolam (XANAX) 0.5 MG tablet Take 0.5 mg by mouth 3 (three) times daily.     cetirizine (ZYRTEC) 10 MG tablet Take 10 mg by mouth daily as needed for allergies.     Cholecalciferol 50 MCG (2000 UT) TABS Take 1 tablet by mouth daily.     escitalopram (LEXAPRO) 20 MG tablet Take 20 mg by  mouth at bedtime.     fluticasone (FLONASE) 50 MCG/ACT nasal spray Place 2 sprays into both nostrils daily. (Patient taking differently: Place 2 sprays into both nostrils as needed.) 16 g 0   methocarbamol (ROBAXIN) 500 MG tablet Take 1 tablet (500 mg total) by mouth every 8 (eight) hours as needed for muscle spasms. 90 tablet 0   metoprolol succinate (TOPROL-XL) 25 MG 24 hr tablet Take 12.5 mg by mouth daily.     omeprazole (PRILOSEC) 20 MG capsule Take 20 mg by mouth daily.     traZODone (DESYREL) 100 MG tablet Take 100 mg by mouth at bedtime.     zolpidem (AMBIEN CR) 6.25 MG CR tablet Take 1 tablet (6.25 mg total) by mouth at bedtime as needed. for sleep 30 tablet 0   No current facility-administered medications for this visit.      Marland Kitchen  PHYSICAL EXAMINATION: ECOG PERFORMANCE STATUS: 0 - Asymptomatic  Vitals:   08/05/23 1259  BP: 122/82  Pulse: 76  Temp: 98 F (36.7 C)  SpO2: 96%    Filed  Weights   08/05/23 1259  Weight: 198 lb 6.4 oz (90 kg)     Physical Exam HENT:     Head: Normocephalic and atraumatic.     Mouth/Throat:     Pharynx: No oropharyngeal exudate.  Eyes:     Pupils: Pupils are equal, round, and reactive to light.  Neck:     Comments: Neck lymphadenopathy improved. Cardiovascular:     Rate and Rhythm: Normal rate and regular rhythm.  Pulmonary:     Effort: Pulmonary effort is normal. No respiratory distress.     Breath sounds: Normal breath sounds. No wheezing.  Abdominal:     General: Bowel sounds are normal. There is no distension.     Palpations: Abdomen is soft. There is no mass.     Tenderness: There is no abdominal tenderness. There is no guarding or rebound.  Musculoskeletal:        General: No tenderness. Normal range of motion.     Cervical back: Normal range of motion and neck supple.  Skin:    General: Skin is warm.  Neurological:     Mental Status: She is alert and oriented to person, place, and time.  Psychiatric:        Mood and Affect: Affect normal.      LABORATORY DATA:  I have reviewed the data as listed Lab Results  Component Value Date   WBC 7.2 08/05/2023   HGB 15.5 (H) 08/05/2023   HCT 44.8 08/05/2023   MCV 90.9 08/05/2023   PLT 209 08/05/2023   Recent Labs    12/20/22 1318 01/30/23 1308 08/05/23 1256  NA 138 136 136  K 3.0* 3.3* 3.5  CL 104 102 105  CO2 25 24 21*  GLUCOSE 200* 184* 194*  BUN 17 18 18   CREATININE 1.32* 1.26* 1.09*  CALCIUM 9.4 9.3 9.2  GFRNONAA 45* 47* 56*  PROT  --  7.4 7.5  ALBUMIN  --  4.1 4.2  AST  --  63* 59*  ALT  --  61* 45*  ALKPHOS  --  97 87  BILITOT  --  0.7 1.2*    RADIOGRAPHIC STUDIES: I have personally reviewed the radiological images as listed and agreed with the findings in the report. CT CHEST ABDOMEN PELVIS W CONTRAST  Result Date: 08/02/2023 CLINICAL DATA:  Staging lymphoma.  * Tracking Code: BO * EXAM: CT CHEST, ABDOMEN, AND PELVIS WITH  CONTRAST TECHNIQUE:  Multidetector CT imaging of the chest, abdomen and pelvis was performed following the standard protocol during bolus administration of intravenous contrast. RADIATION DOSE REDUCTION: This exam was performed according to the departmental dose-optimization program which includes automated exposure control, adjustment of the mA and/or kV according to patient size and/or use of iterative reconstruction technique. CONTRAST:  OMNIPAQUE IOHEXOL 300 MG/ML  SOLN COMPARISON:  Chest abdomen pelvis CT 01/12/2022 and older. FINDINGS: CT CHEST FINDINGS Cardiovascular: Previous chest port no longer seen. Heart is nonenlarged. No significant pericardial effusion. Prominent epicardial fat. The thoracic aorta has a normal course and caliber. Mediastinum/Nodes: Slightly patulous thoracic esophagus. Small thyroid gland. Small subcarinal node identified measuring 6 mm in short axis on series 2, image 26. Previously 6 mm as well. No specific abnormal lymph node enlargement identified in the chest including supraclavicular, chest wall, internal mammary, axillary, hilar or mediastinal regions Lungs/Pleura: No consolidation, pneumothorax or effusion. There is scattered dependent areas of ground-glass and some interstitial septal thickening bilaterally new from the previous exam in 2023 but was seen to some extent on the more recent cardiac examination of 06/27/2023. Nonspecific. Please correlate with any particular symptoms. Musculoskeletal: Mild degenerative changes along the spine. Neurostimulator leads extending along the central canal of the lower thoracic spine. This has enters the canal at proximally the T9-10 level with superior tip at T7 superior endplate leads extend down to a battery pack along the subcutaneous fat posterior left the level of the upper abdomen. CT ABDOMEN PELVIS FINDINGS Hepatobiliary: Fatty liver infiltration. No space-occupying liver lesion. Previous cholecystectomy. Patent portal vein. Pancreas:  Unremarkable. No pancreatic ductal dilatation or surrounding inflammatory changes. Spleen: Normal in size without focal abnormality. Maximum splenic length approaches 10.1 cm. Adrenals/Urinary Tract: Adrenal glands are preserved. No enhancing renal mass or collecting system dilatation. There is area of macroscopic fat in the posterior aspect of the right kidney measuring 12 mm on series 2, image 71 consistent with a small angiomyolipoma. The ureters have normal course and caliber extending down to the normal caliber urinary bladder. There is slight low-lying appearance of the bladder. Please correlate for any pelvic floor relaxation. Stomach/Bowel: Oral contrast seen along the stomach and small bowel. The large bowel has a overall normal course and caliber with moderate stool. Left-sided colonic diverticula. Stomach is distended with contrast and debris. Small bowel is nondilated. Vascular/Lymphatic: Normal caliber aorta and IVC. There are some small retroperitoneal nodes identified including adjacent to the aorta, less than a cm in short axis and not pathologic by size criteria. There is also some small mesenteric nodes. These unchanged overall from previous. Previously there was a mesenteric node measured 5 mm in short axis. This same node measured today measures 7 mm on series 2, image 70. Similar when adjusting for measurement variation mild central mesenteric and right lower quadrant mesenteric stranding, nonspecific. Some of these areas were seen previously. Area in the right is slightly increased Reproductive: Status post hysterectomy. No adnexal masses. Other: No free air or free fluid. Musculoskeletal: Curvature of the spine. Scattered degenerative changes. IMPRESSION: Overall appearance is similar to previous examination. No splenomegaly. Few small lymph nodes identified particularly mesentery and retroperitoneum. There is slight more stranding of the right lower quadrant mesentery and similar in the  central. Nonspecific. Recommend attention on follow up. Timing could be based on symptomatology. Or 6 months. Fatty liver infiltration. Dependent ground-glass changes along the lungs. Nonspecific. Please correlate for any symptomatology. This also could be atelectatic. Neurostimulator. Electronically Signed  By: Karen Kays M.D.   On: 08/02/2023 17:12    ASSESSMENT & PLAN:   Follicular lymphoma grade ii, lymph nodes of multiple sites (HCC) # Follicular lymphoma grade 1-2; at least stage III; s/p 6 cycles of Bendamustine Rituxan.  DEC 2024--CT scan neck chest abdomen pelvis-negative for any progressive adenopathy/or any concerns for new lymphadenopathy or lesions; stable-Few small lymph nodes identified particularly mesentery and retroperitoneum; slight more stranding of the right lower quadrant mesentery and similar in the central. Nonspecific. Recommend attention on follow up. Timing could be based on symptomatology. Or 6 months.   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  Recommend weight loss. Defer further evaluation with PCP.   # Right breast pain [May left breast- WNL]- JULy 2024- WNL.   # stage III CKD- Creatinine- 1.3/GFR-42- [MAY 2023 ]; today GFR- improved- 54  #Incidental findings on Imaging  CT , 2024: Fatty liver infiltration; Dependent ground-glass changes along the lungs/atelectatic; Neurostimulator I reviewed/discussed/counseled the patient.  # port/IV access- explanted.   # DISPOSITION:  # follow up in 6 months;MD; labs- cbc/cmp/LDH- Dr.B  # I reviewed the blood work- with the patient in detail; also reviewed the imaging independently [as summarized above]; and with the patient in detail.    Cc; Dr.Linthavong/Ms. fields NP.     All questions were answered. The patient knows to call the clinic with any problems, questions or concerns.    Earna Coder, MD 08/05/2023 1:28 PM

## 2023-08-05 NOTE — Progress Notes (Signed)
C/o back pain, 8/10, has a stimulator.  CT CAP 07/29/23.  Appetite 75% normal, no supplement drinks.

## 2023-08-05 NOTE — Assessment & Plan Note (Addendum)
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  DEC 2024--CT scan neck chest abdomen pelvis-negative for any progressive adenopathy/or any concerns for new lymphadenopathy or lesions; stable-Few small lymph nodes identified particularly mesentery and retroperitoneum; slight more stranding of the right lower quadrant mesentery and similar in the central. Nonspecific. Recommend attention on follow up. Timing could be based on symptomatology. Or 6 months.   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  Recommend weight loss. Defer further evaluation with PCP.   # Right breast pain [May left breast- WNL]- JULy 2024- WNL.   # stage III CKD- Creatinine- 1.3/GFR-42- [MAY 2023 ]; today GFR- improved- 54  #Incidental findings on Imaging  CT , 2024: Fatty liver infiltration; Dependent ground-glass changes along the lungs/atelectatic; Neurostimulator I reviewed/discussed/counseled the patient.  # port/IV access- explanted.   # DISPOSITION:  # follow up in 6 months;MD; labs- cbc/cmp/LDH- Dr.B  # I reviewed the blood work- with the patient in detail; also reviewed the imaging independently [as summarized above]; and with the patient in detail.    Cc; Dr.Linthavong/Ms. fields NP.

## 2023-10-08 ENCOUNTER — Ambulatory Visit
Admission: RE | Admit: 2023-10-08 | Discharge: 2023-10-08 | Disposition: A | Discharge status: Home / Self Care | Payer: Medicare Other | Source: Ambulatory Visit | Attending: Student in an Organized Health Care Education/Training Program | Admitting: Student in an Organized Health Care Education/Training Program

## 2023-10-08 ENCOUNTER — Ambulatory Visit: Payer: Medicare Other | Admitting: Student in an Organized Health Care Education/Training Program

## 2023-10-08 ENCOUNTER — Encounter: Payer: Self-pay | Admitting: Student in an Organized Health Care Education/Training Program

## 2023-10-08 VITALS — BP 171/82 | HR 77 | Temp 97.3°F | Ht 62.0 in | Wt 200.0 lb

## 2023-10-08 DIAGNOSIS — G894 Chronic pain syndrome: Secondary | ICD-10-CM | POA: Insufficient documentation

## 2023-10-08 DIAGNOSIS — G8929 Other chronic pain: Secondary | ICD-10-CM | POA: Insufficient documentation

## 2023-10-08 DIAGNOSIS — M792 Neuralgia and neuritis, unspecified: Secondary | ICD-10-CM | POA: Insufficient documentation

## 2023-10-08 DIAGNOSIS — M5116 Intervertebral disc disorders with radiculopathy, lumbar region: Secondary | ICD-10-CM

## 2023-10-08 DIAGNOSIS — M5416 Radiculopathy, lumbar region: Secondary | ICD-10-CM | POA: Diagnosis present

## 2023-10-08 MED ORDER — METHOCARBAMOL 1000 MG/10ML IJ SOLN: 200.0000 mg | Freq: Once | INTRAMUSCULAR | Status: DC

## 2023-10-08 MED ORDER — KETOROLAC TROMETHAMINE 30 MG/ML IJ SOLN
30.0000 mg | Freq: Once | INTRAMUSCULAR | Status: AC
  Administered 2023-10-08: 30 mg via INTRAMUSCULAR
  Filled 2023-10-08: qty 1

## 2023-10-08 NOTE — Progress Notes (Signed)
Safety precautions to be maintained throughout the outpatient stay will include: orient to surroundings, keep bed in low position, maintain call bell within reach at all times, provide assistance with transfer out of bed and ambulation.

## 2023-10-08 NOTE — Patient Instructions (Signed)

## 2023-10-08 NOTE — Progress Notes (Signed)
PROVIDER NOTE: Information contained herein reflects review and annotations entered in association with encounter. Interpretation of such information and data should be left to medically-trained personnel. Information provided to patient can be located elsewhere in the medical record under "Patient Instructions". Document created using STT-dictation technology, any transcriptional errors that may result from process are unintentional.    Patient: Danielle Lewis  Service Category: E/M  Provider: Edward Jolly, MD  DOB: [birth year]  DOS: 10/08/2023  Referring Provider: Alm Bustard, NP  MRN: [id number]  Specialty: Interventional Pain Management  PCP: Alm Bustard, NP  Type: Established Patient  Setting: Ambulatory outpatient    Location: Office  Delivery: Face-to-face     HPI  Danielle Lewis, a 68 y.o. year old female, is here today because of her Intractable neuropathic pain of lower extremity, left [M79.2]. Danielle Lewis primary complain today is Back Pain (lower)  Pertinent problems: Danielle Lewis has Lumbar spondylosis; Lumbar disc herniation with radiculopathy (L5/S1); Lumbar degenerative disc disease; and Chronic pain syndrome on their pertinent problem list. Pain Assessment: Severity of Chronic pain is reported as a 6 /10. Location: Back Lower/radiates down both legs to feet both top and bottom. Onset: More than a month ago. Quality: Cramping, Aching, Burning, Constant. Timing: Constant. Modifying factor(s): denies. Vitals:  height is 5\' 2"  (1.575 m) and weight is 200 lb (90.7 kg). Her temperature is 97.3 F (36.3 C) (abnormal). Her blood pressure is 171/82 (abnormal) and her pulse is 77. Her oxygen saturation is 96%.  BMI: Estimated body mass index is 36.58 kg/m as calculated from the following:   Height as of this encounter: 5\' 2"  (1.575 m).   Weight as of this encounter: 200 lb (90.7 kg). Last encounter: 10/23/2022. Last procedure: 12/04/2022.  Reason for encounter:   History of Present  Illness   Danielle Lewis is a 68 year old female who presents with pain from the back to the toes and issues with a spinal cord stimulator. She was referred by Dr. Mariah Milling for evaluation of her spinal cord stimulator.  She experiences pain radiating from her back down to her toes, affecting both the front and back of her legs. The sensation is described as 'shocking', and the spinal cord stimulator, implanted approximately ten months ago, is not providing relief. Instead, it causes trembling due to the intensity of the stimulation, currently set at 46. The stimulator was adjusted twice, most recently after Christmas, but these changes have not improved her symptoms. While it helps with burning and tingling in her feet, overall pain and functional ability have not improved since the implant.  In the last two to three months, she has experienced cramps in her back on the right side, significantly limiting her mobility and making it difficult to breathe. She struggles with daily activities, such as walking through her house, washing dishes, and making her bed, due to fatigue and pain. She reports swelling in her ankles and electric shock sensations in her legs, with difficulty in mobility and breathing.  No use of NSAIDs or muscle relaxers, and she has not used Robaxin before. She spends a lot of time sitting or lying down and only drives when necessary, such as for medical appointments.       ROS  Constitutional: Denies any fever or chills Gastrointestinal: No reported hemesis, hematochezia, vomiting, or acute GI distress Musculoskeletal:  as above Neurological: No reported episodes of acute onset apraxia, aphasia, dysarthria, agnosia, amnesia, paralysis, loss of coordination, or loss of  consciousness  Medication Review  ALPRAZolam, Cholecalciferol, acyclovir, amLODipine, cetirizine, escitalopram, fluticasone, hydrochlorothiazide, losartan, methocarbamol, metoprolol succinate, naproxen, omeprazole,  traZODone, triamcinolone, and zolpidem  History Review  Allergy: Danielle Lewis is allergic to benadryl [diphenhydramine], oxycodone, and penicillins. Drug: Danielle Lewis  reports that she does not currently use drugs. Alcohol:  reports that she does not currently use alcohol after a past usage of about 1.0 standard drink of alcohol per week. Tobacco:  reports that she has never smoked. She has never used smokeless tobacco. Social: Danielle Lewis  reports that she has never smoked. She has never used smokeless tobacco. She reports that she does not currently use alcohol after a past usage of about 1.0 standard drink of alcohol per week. She reports that she does not currently use drugs. Medical:  has a past medical history of Anemia, Anginal pain (HCC), Anxiety, Arthritis, Cancer (HCC), COVID-19 (2021), Depression, Dyspnea, GERD (gastroesophageal reflux disease), Headache, Hypertension, Left bundle branch block, Pneumonia, and Stroke (HCC). Surgical: Danielle Lewis  has a past surgical history that includes Cholecystectomy; Appendectomy; Tonsillectomy; Abdominal hysterectomy; Lymph gland excision (Right, 01/22/2020); Portacath placement (N/A, 02/24/2020); Breast biopsy (Right, 04/28/2021); Dilation and curettage of uterus; Excision of breast biopsy (Right, 05/19/2021); Laparoscopic bilateral salpingo oophorectomy (Bilateral, 07/06/2021); cyst removal from head (Right, 09/04/2022); port a cath removed; and Thoracic laminectomy for spinal cord stimulator (N/A, 01/02/2023). Family: family history includes Breast cancer in her mother; Cancer in her mother.  Laboratory Chemistry Profile   Renal Lab Results  Component Value Date   BUN 18 08/05/2023   CREATININE 1.09 (H) 08/05/2023   BCR 15 05/09/2015   GFRAA >60 05/03/2020   GFRNONAA 56 (L) 08/05/2023    Hepatic Lab Results  Component Value Date   AST 59 (H) 08/05/2023   ALT 45 (H) 08/05/2023   ALBUMIN 4.2 08/05/2023   ALKPHOS 87 08/05/2023   HCVAB NON REACTIVE  01/11/2020    Electrolytes Lab Results  Component Value Date   NA 136 08/05/2023   K 3.5 08/05/2023   CL 105 08/05/2023   CALCIUM 9.2 08/05/2023    Bone No results found for: "VD25OH", "VD125OH2TOT", "ZO1096EA5", "WU9811BJ4", "25OHVITD1", "25OHVITD2", "25OHVITD3", "TESTOFREE", "TESTOSTERONE"  Inflammation (CRP: Acute Phase) (ESR: Chronic Phase) No results found for: "CRP", "ESRSEDRATE", "LATICACIDVEN"       Note: Above Lab results reviewed.  Recent Imaging Review  CT CHEST ABDOMEN PELVIS W CONTRAST CLINICAL DATA:  Staging lymphoma.  * Tracking Code: BO *  EXAM: CT CHEST, ABDOMEN, AND PELVIS WITH CONTRAST  TECHNIQUE: Multidetector CT imaging of the chest, abdomen and pelvis was performed following the standard protocol during bolus administration of intravenous contrast.  RADIATION DOSE REDUCTION: This exam was performed according to the departmental dose-optimization program which includes automated exposure control, adjustment of the mA and/or kV according to patient size and/or use of iterative reconstruction technique.  CONTRAST:  OMNIPAQUE IOHEXOL 300 MG/ML  SOLN  COMPARISON:  Chest abdomen pelvis CT 01/12/2022 and older.  FINDINGS: CT CHEST FINDINGS  Cardiovascular: Previous chest port no longer seen. Heart is nonenlarged. No significant pericardial effusion. Prominent epicardial fat. The thoracic aorta has a normal course and caliber.  Mediastinum/Nodes: Slightly patulous thoracic esophagus. Small thyroid gland. Small subcarinal node identified measuring 6 mm in short axis on series 2, image 26. Previously 6 mm as well. No specific abnormal lymph node enlargement identified in the chest including supraclavicular, chest wall, internal mammary, axillary, hilar or mediastinal regions  Lungs/Pleura: No consolidation, pneumothorax or effusion. There is scattered  dependent areas of ground-glass and some interstitial septal thickening bilaterally new from  the previous exam in 2023 but was seen to some extent on the more recent cardiac examination of 06/27/2023. Nonspecific. Please correlate with any particular symptoms.  Musculoskeletal: Mild degenerative changes along the spine. Neurostimulator leads extending along the central canal of the lower thoracic spine. This has enters the canal at proximally the T9-10 level with superior tip at T7 superior endplate leads extend down to a battery pack along the subcutaneous fat posterior left the level of the upper abdomen.  CT ABDOMEN PELVIS FINDINGS  Hepatobiliary: Fatty liver infiltration. No space-occupying liver lesion. Previous cholecystectomy. Patent portal vein.  Pancreas: Unremarkable. No pancreatic ductal dilatation or surrounding inflammatory changes.  Spleen: Normal in size without focal abnormality. Maximum splenic length approaches 10.1 cm.  Adrenals/Urinary Tract: Adrenal glands are preserved. No enhancing renal mass or collecting system dilatation. There is area of macroscopic fat in the posterior aspect of the right kidney measuring 12 mm on series 2, image 71 consistent with a small angiomyolipoma. The ureters have normal course and caliber extending down to the normal caliber urinary bladder. There is slight low-lying appearance of the bladder. Please correlate for any pelvic floor relaxation.  Stomach/Bowel: Oral contrast seen along the stomach and small bowel. The large bowel has a overall normal course and caliber with moderate stool. Left-sided colonic diverticula. Stomach is distended with contrast and debris. Small bowel is nondilated.  Vascular/Lymphatic: Normal caliber aorta and IVC. There are some small retroperitoneal nodes identified including adjacent to the aorta, less than a cm in short axis and not pathologic by size criteria. There is also some small mesenteric nodes. These unchanged overall from previous. Previously there was a mesenteric  node measured 5 mm in short axis. This same node measured today measures 7 mm on series 2, image 70. Similar when adjusting for measurement variation mild central mesenteric and right lower quadrant mesenteric stranding, nonspecific. Some of these areas were seen previously. Area in the right is slightly increased  Reproductive: Status post hysterectomy. No adnexal masses.  Other: No free air or free fluid.  Musculoskeletal: Curvature of the spine. Scattered degenerative changes.  IMPRESSION: Overall appearance is similar to previous examination. No splenomegaly. Few small lymph nodes identified particularly mesentery and retroperitoneum.  There is slight more stranding of the right lower quadrant mesentery and similar in the central. Nonspecific. Recommend attention on follow up. Timing could be based on symptomatology. Or 6 months.  Fatty liver infiltration.  Dependent ground-glass changes along the lungs. Nonspecific. Please correlate for any symptomatology. This also could be atelectatic.  Neurostimulator.  Electronically Signed   By: Karen Kays M.D.   On: 08/02/2023 17:12 Note: Reviewed        Physical Exam  General appearance: Well nourished, well developed, and well hydrated. In no apparent acute distress Mental status: Alert, oriented x 3 (person, place, & time)       Respiratory: No evidence of acute respiratory distress Eyes: PERLA Vitals: BP (!) 171/82   Pulse 77   Temp (!) 97.3 F (36.3 C)   Ht 5\' 2"  (1.575 m)   Wt 200 lb (90.7 kg)   SpO2 96%   BMI 36.58 kg/m  BMI: Estimated body mass index is 36.58 kg/m as calculated from the following:   Height as of this encounter: 5\' 2"  (1.575 m).   Weight as of this encounter: 200 lb (90.7 kg). Ideal: Ideal body weight: 50.1 kg (110 lb 7.2  oz) Adjusted ideal body weight: 66.3 kg (146 lb 4.3 oz)  Low back and bilateral leg pain  Assessment   Diagnosis  1. Intractable neuropathic pain of lower extremity,  left   2. 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 Lumbar disc herniation with radiculopathy (L5/S1)   4. Chronic radicular lumbar pain   5. Chronic pain syndrome        Plan of Care   Chronic Pain with Spinal Cord Stimulator Persistent pain extends from the back to the toes, accompanied by right-sided back cramps, and difficulty moving and breathing. The spinal cord stimulator, implanted 10 months ago, causes electric shocks without providing relief. Despite two adjustments, the most recent post-Christmas, pain levels remain unchanged from pre-implantation, and functional ability is limited. Potential issues such as wire movement, lead fracture, and programming errors were discussed, with a preference to avoid surgery/explant. Order thoracic and lumbar spine x-rays. 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 Administer a 30 mg Toradol injection. Advise avoiding NSAIDs (ibuprofen, Aleve, Goody's powder) for three days. Follow up based on x-ray results and feedback from [facility].     Danielle Lewis has a current medication list which includes the following long-term medication(s): fluticasone, losartan, omeprazole, trazodone, triamcinolone, zolpidem, and escitalopram.  Pharmacotherapy (Medications Ordered): Meds ordered this encounter  Medications   DISCONTD: methocarbamol (ROBAXIN) injection 200 mg   ketorolac (TORADOL) 30 MG/ML injection 30 mg   Orders:  Orders Placed This Encounter  Procedures   DG Lumbar Spine 2-3 Views    Standing Status:   Future    Expiration Date:   10/07/2024    Reason for Exam (SYMPTOM  OR DIAGNOSIS REQUIRED):   low back pain, eval SCS    Preferred imaging location?:   [facility]   DG Thoracic Spine 2 View    Standing Status:   Future    Expected Date:   10/08/2023    Expiration Date:   10/07/2024    Reason for Exam (SYMPTOM  OR DIAGNOSIS REQUIRED):   mid back pain    Preferred imaging location?:   [facility]   Follow-up plan:   No follow-ups on file.      Left L5/S1 ESI 04/11/22, Left L4 PNS 05/21/22, Right L5 PNS 06/27/22, SCS trial [employer]          Recent Visits No visits were found meeting these conditions. Showing recent visits within past 90 days and meeting all other requirements Today's Visits Date Type Provider Dept  10/08/23 Office Visit Edward Jolly, MD [facility]  Showing today's visits and meeting all other requirements Future Appointments No visits were found meeting these conditions. Showing future appointments within next 90 days and meeting all other requirements  I discussed the assessment and treatment plan with the patient. The patient was provided an opportunity to ask questions and all were answered. The patient agreed with the plan and demonstrated an understanding of the instructions.  Patient advised to call back or seek an in-person evaluation if the symptoms or condition worsens.  Duration of encounter: .  Total time on encounter, as per AMA guidelines included both the face-to-face and non-face-to-face time personally spent by the physician and/or other qualified health care professional(s) on the day of the encounter (includes time in activities that require the physician or other qualified health care professional and does not include time in activities normally performed by clinical staff). Physician's time may include the following activities when performed:  Preparing to see the patient (e.g., pre-charting review of records, searching for previously ordered imaging, lab work, and nerve conduction tests) Review of prior analgesic pharmacotherapies. Reviewing PMP Interpreting ordered tests (e.g., lab work, imaging, nerve conduction tests) Performing post-procedure evaluations, including interpretation of diagnostic procedures Obtaining and/or reviewing separately obtained history Performing a medically appropriate  examination and/or evaluation Counseling and educating the patient/family/caregiver Ordering medications, tests, or procedures Referring and communicating with other health care professionals (when not separately reported) Documenting clinical information in the electronic or other health record Independently interpreting results (not separately reported) and communicating results to the patient/ family/caregiver Care coordination (not separately reported)  Note by: Edward Jolly, MD Date: 10/08/2023; Time: 9:47 AM

## 2024-01-30 ENCOUNTER — Other Ambulatory Visit: Payer: Self-pay | Admitting: Internal Medicine

## 2024-01-30 DIAGNOSIS — Z1231 Encounter for screening mammogram for malignant neoplasm of breast: Secondary | ICD-10-CM

## 2024-02-03 ENCOUNTER — Encounter: Payer: Self-pay | Admitting: Internal Medicine

## 2024-02-03 ENCOUNTER — Inpatient Hospital Stay: Payer: Medicare (Managed Care) | Attending: Internal Medicine

## 2024-02-03 ENCOUNTER — Inpatient Hospital Stay (HOSPITAL_BASED_OUTPATIENT_CLINIC_OR_DEPARTMENT_OTHER): Payer: Medicare (Managed Care) | Admitting: Internal Medicine

## 2024-02-03 VITALS — BP 153/91 | HR 88 | Temp 98.0°F | Resp 16 | Ht 62.0 in | Wt 203.7 lb

## 2024-02-03 DIAGNOSIS — C8218 Follicular lymphoma grade II, lymph nodes of multiple sites: Secondary | ICD-10-CM | POA: Insufficient documentation

## 2024-02-03 DIAGNOSIS — N644 Mastodynia: Secondary | ICD-10-CM | POA: Diagnosis not present

## 2024-02-03 DIAGNOSIS — L089 Local infection of the skin and subcutaneous tissue, unspecified: Secondary | ICD-10-CM | POA: Insufficient documentation

## 2024-02-03 LAB — CMP (CANCER CENTER ONLY)
ALT: 52 U/L — ABNORMAL HIGH (ref 0–44)
AST: 47 U/L — ABNORMAL HIGH (ref 15–41)
Albumin: 4.2 g/dL (ref 3.5–5.0)
Alkaline Phosphatase: 110 U/L (ref 38–126)
Anion gap: 9 (ref 5–15)
BUN: 22 mg/dL (ref 8–23)
CO2: 24 mmol/L (ref 22–32)
Calcium: 9.2 mg/dL (ref 8.9–10.3)
Chloride: 101 mmol/L (ref 98–111)
Creatinine: 1.11 mg/dL — ABNORMAL HIGH (ref 0.44–1.00)
GFR, Estimated: 54 mL/min — ABNORMAL LOW (ref >60)
Glucose, Bld: 390 mg/dL — ABNORMAL HIGH (ref 70–99)
Potassium: 4 mmol/L (ref 3.5–5.1)
Sodium: 134 mmol/L — ABNORMAL LOW (ref 135–145)
Total Bilirubin: 0.8 mg/dL (ref 0.0–1.2)
Total Protein: 7.2 g/dL (ref 6.5–8.1)

## 2024-02-03 LAB — CBC WITH DIFFERENTIAL (CANCER CENTER ONLY)
Abs Immature Granulocytes: 0.01 10*3/uL (ref 0.00–0.07)
Basophils Absolute: 0 10*3/uL (ref 0.0–0.1)
Basophils Relative: 1 %
Eosinophils Absolute: 0.1 10*3/uL (ref 0.0–0.5)
Eosinophils Relative: 2 %
HCT: 45.2 % (ref 36.0–46.0)
Hemoglobin: 15.3 g/dL — ABNORMAL HIGH (ref 12.0–15.0)
Immature Granulocytes: 0 %
Lymphocytes Relative: 33 %
Lymphs Abs: 1.7 10*3/uL (ref 0.7–4.0)
MCH: 30.7 pg (ref 26.0–34.0)
MCHC: 33.8 g/dL (ref 30.0–36.0)
MCV: 90.6 fL (ref 80.0–100.0)
Monocytes Absolute: 0.3 10*3/uL (ref 0.1–1.0)
Monocytes Relative: 7 %
Neutro Abs: 3 10*3/uL (ref 1.7–7.7)
Neutrophils Relative %: 57 %
Platelet Count: 172 10*3/uL (ref 150–400)
RBC: 4.99 MIL/uL (ref 3.87–5.11)
RDW: 12.9 % (ref 11.5–15.5)
WBC Count: 5.2 10*3/uL (ref 4.0–10.5)
nRBC: 0 % (ref 0.0–0.2)

## 2024-02-03 LAB — LACTATE DEHYDROGENASE: LDH: 154 U/L (ref 98–192)

## 2024-02-03 NOTE — Progress Notes (Signed)
 Fell 3 weeks ago, got dizzy getting up, no injuries.t  Pt states she is having a loop monitor battery placed this Wednesday. Sees Dr. Cleda Curly.  10/08/23 T-spine and l-spine xrays.  Would like results of her CT CAP done 07/29/23.  Having mammogram in Aug.

## 2024-02-03 NOTE — Assessment & Plan Note (Addendum)
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  DEC 2024--CT scan neck chest abdomen pelvis-negative for any progressive adenopathy/or any concerns for new lymphadenopathy or lesions; stable-Few small lymph nodes identified particularly mesentery and retroperitoneum; slight more stranding of the right lower quadrant mesentery and similar in the central. Nonspecific.  STABLE- will order CT scan today/ for dec 2025.    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  Recommend weight loss. Defer further evaluation with PCP.   # Elevated Blood glucose- 390 [30 min post lunch]- discussed re: low carb diet; pt will reach to PCP re: further work up/management  # stage III CKD- Creatinine- 1.3/GFR-42- [MAY 2023 ]; today GFR- improved- 54- stable.   # port/IV access- explanted.   # DISPOSITION:  # follow up in 6 months;MD; labs- cbc/cmp/LDH; DRI-CT CAP prior-- Dr.B    Cc; Dr.Linthavong/Ms. fields NP.

## 2024-02-03 NOTE — Progress Notes (Signed)
 [facility] CONSULT NOTE  Patient Care Team: Will Hare, NP as PCP - General (Family Medicine) Eldred Grego, MD as Consulting Physician (General Surgery) Gwyn Leos, MD as Consulting Physician (Internal Medicine) Eldred Grego, MD as Consulting Physician (General Surgery)  CHIEF COMPLAINTS/PURPOSE OF CONSULTATION:   Oncology History Overview Note  # MAY 2021- Bilateral neck adenopathy right more than left; highly concerning for lymphoproliferative disorder.  Excisional biopsy right neck- [Dr.Cintron];  A. 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   B. 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   # MAY 2021-CBC/CMP normal /  mildly elevated alkaline phosphatase. [PCP; [facility]].  # Lanice Pita, 2021- Benda-Rituxa  # SURVIVORSHIP:   # GENETICS:   DIAGNOSIS: Follicle lymphoma  STAGE: III        ;  GOALS: Control  CURRENT/MOST RECENT THERAPY : Bendamustine  Rituxan  [C]     Follicular lymphoma grade ii, lymph nodes of multiple sites (HCC)  01/29/2020 Initial Diagnosis   Follicular lymphoma grade ii, lymph nodes of multiple sites (HCC)   03/02/2020 - 08/25/2020 Chemotherapy   Patient is on Treatment Plan : NON-HODGKINS LYMPHOMA Rituximab  D1 / Bendamustine  D1,2 q28d      HISTORY OF PRESENTING ILLNESS: Alone.  Ambulating dependently.  Danielle Lewis 68 y.o.  female  follicular lymphoma grade 1-2 currently s/p bendamustine  Rituxan -currently on surveillance is here for follow-up/and review results of the CT scan.  In the interim patient was evaluated by cardiology for dizzy spell with orthostatic/ had syncope episode. Pt states she is having a loop monitor battery placed this Wednesday. Sees Dr. Cleda Curly.  Patient denies any new lumps or bumps.  Denies any weight loss.  Chronic hot flashes not any worse.  Continues to have chronic back pain not  significantly improved after stimulator placement.  Review of Systems  Constitutional:  Positive for diaphoresis and malaise/fatigue. Negative for chills, fever and weight loss.  HENT:  Negative for nosebleeds and sore throat.   Eyes:  Negative for double vision.  Respiratory:  Negative for cough, hemoptysis, sputum production, shortness of breath and wheezing.   Cardiovascular:  Negative for chest pain, palpitations, orthopnea and leg swelling.  Gastrointestinal:  Negative for abdominal pain, blood in stool, constipation, diarrhea, heartburn, melena, nausea and vomiting.  Genitourinary:  Negative for dysuria, frequency and urgency.  Musculoskeletal:  Positive for back pain and joint pain.  Skin: Negative.  Negative for itching and rash.  Neurological:  Negative for dizziness, tingling, focal weakness, weakness and headaches.  Endo/Heme/Allergies:  Does not bruise/bleed easily.  Psychiatric/Behavioral:  Negative for depression. The patient has insomnia. The patient is not nervous/anxious.      MEDICAL HISTORY:  Past Medical History:  Diagnosis Date   Anemia    Anginal pain (HCC)    Anxiety    Arthritis    Cancer (HCC)    COVID-19 2021   Depression    Dyspnea    GERD (gastroesophageal reflux disease)    Headache    Hypertension    Left bundle branch block    Pneumonia    Stroke [facility])     SURGICAL HISTORY: Past Surgical History:  Procedure Laterality Date   ABDOMINAL HYSTERECTOMY     APPENDECTOMY     BREAST BIOPSY Right 04/28/2021   U/s bx 7:00/2cmfn"heart" marker-INTRADUCTAL PAPILLOMA WITH APOCRINE METAPLASIA   CHOLECYSTECTOMY     28years ago  cyst removal from head Right 09/04/2022   DILATION AND CURETTAGE OF UTERUS     EXCISION OF BREAST BIOPSY Right 05/19/2021   Procedure: EXCISION OF BREAST BIOPSY w/ Radio frequency tag;  Surgeon: Eldred Grego, MD;  Location: [facility] ORS;  Service: General;  Laterality: Right;   LAPAROSCOPIC BILATERAL SALPINGO OOPHERECTOMY  Bilateral 07/06/2021   Procedure: LAPAROSCOPIC BILATERAL SALPINGO OOPHORECTOMY WITH CYSTOSCOPY;  Surgeon: Darl Edu, MD;  Location: [facility] ORS;  Service: Gynecology;  Laterality: Bilateral;   LYMPH GLAND EXCISION Right 01/22/2020   Procedure: CERVICAL LYMPH GLAND EXCISION;  Surgeon: Eldred Grego, MD;  Location: [facility] ORS;  Service: General;  Laterality: Right;   port a cath removed     PORTACATH PLACEMENT N/A 02/24/2020   Procedure: INSERTION PORT-A-CATH;  Surgeon: Eldred Grego, MD;  Location: [facility] ORS;  Service: General;  Laterality: N/A;   THORACIC LAMINECTOMY FOR SPINAL CORD STIMULATOR N/A 01/02/2023   Procedure: THORACIC LAMINECTOMY FOR SPINAL CORD STIMULATOR PLACEMENT ([employer]);  Surgeon: Jodeen Munch, MD;  Location: [facility] ORS;  Service: Neurosurgery;  Laterality: N/A;   TONSILLECTOMY      SOCIAL HISTORY: Social History   Socioeconomic History   Marital status: Married    Spouse name: Denman Fischer   Number of children: Not on file   Years of education: Not on file   Highest education level: Not on file  Occupational History   Not on file  Tobacco Use   Smoking status: Never   Smokeless tobacco: Never  Vaping Use   Vaping status: Never Used  Substance and Sexual Activity   Alcohol use: Not Currently    Alcohol/week: 1.0 standard drink of alcohol    Types: 1 Glasses of wine per week   Drug use: Not Currently   Sexual activity: Yes    Birth control/protection: Surgical    Comment: Tubal  Other Topics Concern   Not on file  Social History Narrative   Lives in [place]; [place]. Never smoked; wine rarely. Used in work in [employer]. With husband.    Social Drivers of Corporate investment banker Strain: Low Risk  (11/11/2023)   Received from [facility]   Overall Financial Resource Strain (CARDIA)    Difficulty of Paying Living Expenses: Not hard at all  Food Insecurity: No Food Insecurity (11/11/2023)   Received from [facility]   Hunger Vital Sign    Worried About Running Out of Food in the Last Year: Never true    Ran Out of Food in the Last Year: Never true  Transportation Needs: No Transportation Needs (11/11/2023)   Received from [facility] - Transportation    In the past 12 months, has lack of transportation kept you from medical appointments or from getting medications?: No    Lack of Transportation (Non-Medical): No  Physical Activity: Not on file  Stress: Not on file  Social Connections: Not on file  Intimate Partner Violence: Not on file    FAMILY HISTORY: Family History  Problem Relation Age of Onset   Breast cancer Mother    Cancer Mother        unknown type    ALLERGIES:  is allergic to benadryl  [diphenhydramine ], oxycodone , and penicillins.  MEDICATIONS:  Current Outpatient Medications  Medication Sig Dispense Refill   acyclovir  (ZOVIRAX ) 400 MG tablet Take 400 mg by mouth 2 (two) times daily.     albuterol  (VENTOLIN  HFA) 108 (90 Base) MCG/ACT inhaler Inhale 2 puffs into  the lungs.     ALPRAZolam (XANAX) 0.5 MG tablet Take 0.5 mg by mouth 3 (three) times daily.     amLODipine  (NORVASC ) 2.5 MG tablet Take 2.5 mg by mouth daily.     cetirizine (ZYRTEC) 10 MG tablet Take 10 mg by mouth daily as needed for allergies.     Cholecalciferol 50 MCG (2000 UT) TABS Take 1 tablet by mouth daily.     fluticasone  (FLONASE ) 50 MCG/ACT nasal spray Place 2 sprays into both nostrils daily. (Patient taking differently: Place 2 sprays into both nostrils as needed.) 16 g 0   hydrochlorothiazide (HYDRODIURIL) 25 MG tablet Take 25 mg by mouth daily.     methocarbamol  (ROBAXIN ) 500 MG tablet Take 1 tablet (500 mg total) by mouth every 8 (eight) hours as needed for muscle spasms. 90 tablet 0   metoprolol  succinate (TOPROL -XL) 25 MG 24 hr tablet Take 12.5 mg by mouth daily.     naproxen (NAPROSYN) 500 MG tablet Take 500 mg by mouth 2 (two) times daily as needed.      omeprazole (PRILOSEC) 20 MG capsule Take 20 mg by mouth daily.     traZODone (DESYREL) 100 MG tablet Take 100 mg by mouth at bedtime.     triamcinolone (NASACORT) 55 MCG/ACT AERO nasal inhaler Place 2 sprays into the nose daily.     zolpidem  (AMBIEN  CR) 6.25 MG CR tablet Take 1 tablet (6.25 mg total) by mouth at bedtime as needed. for sleep 30 tablet 0   No current facility-administered medications for this visit.      Aaron Aas  PHYSICAL EXAMINATION: ECOG PERFORMANCE STATUS: 0 - Asymptomatic  Vitals:   02/03/24 1237 02/03/24 1304  BP: (!) 163/94 (!) 153/91  Pulse: 88   Resp: 16   Temp: 98 F (36.7 C)   SpO2: 96%     Filed Weights   02/03/24 1237  Weight: 203 lb 11.2 oz (92.4 kg)     Physical Exam HENT:     Head: Normocephalic and atraumatic.     Mouth/Throat:     Pharynx: No oropharyngeal exudate.  Eyes:     Pupils: Pupils are equal, round, and reactive to light.  Neck:     Comments: Neck lymphadenopathy improved. Cardiovascular:     Rate and Rhythm: Normal rate and regular rhythm.  Pulmonary:     Effort: Pulmonary effort is normal. No respiratory distress.     Breath sounds: Normal breath sounds. No wheezing.  Abdominal:     General: Bowel sounds are normal. There is no distension.     Palpations: Abdomen is soft. There is no mass.     Tenderness: There is no abdominal tenderness. There is no guarding or rebound.  Musculoskeletal:        General: No tenderness. Normal range of motion.     Cervical back: Normal range of motion and neck supple.  Skin:    General: Skin is warm.  Neurological:     Mental Status: She is alert and oriented to person, place, and time.  Psychiatric:        Mood and Affect: Affect normal.      LABORATORY DATA:  I have reviewed the data as listed Lab Results  Component Value Date   WBC 5.2 02/03/2024   HGB 15.3 (H) 02/03/2024   HCT 45.2 02/03/2024   MCV 90.6 02/03/2024   PLT 172 02/03/2024   Recent Labs    08/05/23 1256  02/03/24 1233  NA 136 134*  K  3.5 4.0  CL 105 101  CO2 21* 24  GLUCOSE 194* 390*  BUN 18 22  CREATININE 1.09* 1.11*  CALCIUM 9.2 9.2  GFRNONAA 56* 54*  PROT 7.5 7.2  ALBUMIN 4.2 4.2  AST 59* 47*  ALT 45* 52*  ALKPHOS 87 110  BILITOT 1.2* 0.8    RADIOGRAPHIC STUDIES: I have personally reviewed the radiological images as listed and agreed with the findings in the report. No results found.  ASSESSMENT & PLAN:   Follicular lymphoma grade ii, lymph nodes of multiple sites (HCC) # Follicular lymphoma grade 1-2; at least stage III; s/p 6 cycles of Bendamustine  Rituxan .  DEC 2024--CT scan neck chest abdomen pelvis-negative for any progressive adenopathy/or any concerns for new lymphadenopathy or lesions; stable-Few small lymph nodes identified particularly mesentery and retroperitoneum; slight more stranding of the right lower quadrant mesentery and similar in the central. Nonspecific.  STABLE- will order CT scan today/ for dec 2025.    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  Recommend weight loss. Defer further evaluation with PCP.   # Elevated Blood glucose- 390 [30 min post lunch]- discussed re: low carb diet; pt will reach to PCP re: further work up/management  # stage III CKD- Creatinine- 1.3/GFR-42- [MAY 2023 ]; today GFR- improved- 54- stable.   # port/IV access- explanted.   # DISPOSITION:  # follow up in 6 months;MD; labs- cbc/cmp/LDH; DRI-CT CAP prior-- Dr.B    Cc; Dr.Linthavong/Ms. fields NP.     All questions were answered. The patient knows to call the clinic with any problems, questions or concerns.    Gwyn Leos, MD 02/03/2024 1:49 PM

## 2024-02-05 DIAGNOSIS — R55 Syncope and collapse: Secondary | ICD-10-CM | POA: Insufficient documentation

## 2024-02-11 ENCOUNTER — Encounter: Payer: Self-pay | Admitting: Nurse Practitioner

## 2024-02-11 ENCOUNTER — Inpatient Hospital Stay (HOSPITAL_BASED_OUTPATIENT_CLINIC_OR_DEPARTMENT_OTHER): Admitting: Nurse Practitioner

## 2024-02-11 ENCOUNTER — Inpatient Hospital Stay: Admitting: Nurse Practitioner

## 2024-02-11 VITALS — BP 126/90 | HR 82 | Temp 98.0°F | Resp 16 | Wt 200.0 lb

## 2024-02-11 DIAGNOSIS — L089 Local infection of the skin and subcutaneous tissue, unspecified: Secondary | ICD-10-CM

## 2024-02-11 DIAGNOSIS — C8218 Follicular lymphoma grade II, lymph nodes of multiple sites: Secondary | ICD-10-CM | POA: Diagnosis not present

## 2024-02-11 MED ORDER — DOXYCYCLINE HYCLATE 100 MG PO TABS: 100.0000 mg | ORAL_TABLET | Freq: Two times a day (BID) | ORAL | 0 refills | Status: AC

## 2024-02-11 NOTE — Addendum Note (Signed)
 Addended by: Artelia Game G on: 02/11/2024 04:25 PM   Modules accepted: Level of Service

## 2024-02-11 NOTE — Progress Notes (Addendum)
Duplicate entry. Disregard.  

## 2024-02-11 NOTE — Progress Notes (Signed)
 Pt in for left lower quadrant breast pain and nodule.  Pt reports she recently had heart stimulator insertion and steri strips are intact.

## 2024-02-11 NOTE — Progress Notes (Signed)
 Symptom Management Clinic  [facility] Department of the [facility]. [facility] [address] [place], [place] [address] [phone] (phone) [phone] (fax)  Patient Care Team: Will Hare, NP as PCP - General (Family Medicine) Eldred Grego, MD as Consulting Physician (General Surgery) Gwyn Leos, MD as Consulting Physician (Internal Medicine) Eldred Grego, MD as Consulting Physician (General Surgery)   Name of the patient: Danielle Lewis  [id number]  11/10/1955   Date of visit: 02/11/24  Diagnosis- Follicular Lymphoma  Chief complaint/ Reason for visit- breast pain  Heme/Onc history:  Oncology History Overview Note  # MAY 2021- Bilateral neck adenopathy right more than left; highly concerning for lymphoproliferative disorder.  Excisional biopsy right neck- [Dr.Cintron];  A. 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   B. 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   # MAY 2021-CBC/CMP normal /  mildly elevated alkaline phosphatase. [PCP; [facility]].  # Lanice Pita, 2021- Benda-Rituxa  # SURVIVORSHIP:   # GENETICS:   DIAGNOSIS: Follicle lymphoma  STAGE: III        ;  GOALS: Control  CURRENT/MOST RECENT THERAPY : Bendamustine  Rituxan  [C]     Follicular lymphoma grade ii, lymph nodes of multiple sites (HCC)  01/29/2020 Initial Diagnosis   Follicular lymphoma grade ii, lymph nodes of multiple sites (HCC)   03/02/2020 - 08/25/2020 Chemotherapy   Patient is on Treatment Plan : NON-HODGKINS LYMPHOMA Rituximab  D1 / Bendamustine  D1,2 q28d       Interval history- Patient is 68 year old female with above history of follicular lymphoma, s/p rituximab  & bendamustine , who returns to clinic for concerns of left breast pain. She feels poorly overall since having a heart monitor implanted on  02/05/24. She has chronic back pain and also has a stimulator in place which does not improve her symptoms. She noticed left breast pain over the past week that has gradually worsened. She had a mammogram last year. Thinks she feels a lump.    Review of systems- Review of Systems  Constitutional:  Positive for malaise/fatigue. Negative for chills and fever.  Cardiovascular:  Positive for chest pain (post procedural and left breast).  Musculoskeletal:  Negative for joint pain.  Skin:  Negative for itching and rash.    Allergies  Allergen Reactions   Benadryl  [Diphenhydramine ]     Has opposite effect   Oxycodone  Itching and Swelling    Itching Swelling in ankles and hands   Penicillins Rash   Past Medical History:  Diagnosis Date   Anemia    Anginal pain (HCC)    Anxiety    Arthritis    Cancer (HCC)    COVID-19 2021   Depression    Dyspnea    GERD (gastroesophageal reflux disease)    Headache    Hypertension    Left bundle branch block    Pneumonia    Stroke [facility])     Past Surgical History:  Procedure Laterality Date   ABDOMINAL HYSTERECTOMY     APPENDECTOMY     BREAST BIOPSY Right 04/28/2021   U/s bx 7:00/2cmfnheart marker-INTRADUCTAL PAPILLOMA WITH APOCRINE METAPLASIA   CHOLECYSTECTOMY     28years ago   cyst removal from head Right 09/04/2022   DILATION AND CURETTAGE OF UTERUS     EXCISION OF BREAST BIOPSY Right 05/19/2021   Procedure: EXCISION OF BREAST BIOPSY w/  Radio frequency tag;  Surgeon: Eldred Grego, MD;  Location: [facility] ORS;  Service: General;  Laterality: Right;   LAPAROSCOPIC BILATERAL SALPINGO OOPHERECTOMY Bilateral 07/06/2021   Procedure: LAPAROSCOPIC BILATERAL SALPINGO OOPHORECTOMY WITH CYSTOSCOPY;  Surgeon: Darl Edu, MD;  Location: [facility] ORS;  Service: Gynecology;  Laterality: Bilateral;   LYMPH GLAND EXCISION Right 01/22/2020   Procedure: CERVICAL LYMPH GLAND EXCISION;  Surgeon: Eldred Grego, MD;  Location: [facility] ORS;   Service: General;  Laterality: Right;   port a cath removed     PORTACATH PLACEMENT N/A 02/24/2020   Procedure: INSERTION PORT-A-CATH;  Surgeon: Eldred Grego, MD;  Location: [facility] ORS;  Service: General;  Laterality: N/A;   THORACIC LAMINECTOMY FOR SPINAL CORD STIMULATOR N/A 01/02/2023   Procedure: THORACIC LAMINECTOMY FOR SPINAL CORD STIMULATOR PLACEMENT ([employer]);  Surgeon: Jodeen Munch, MD;  Location: [facility] ORS;  Service: Neurosurgery;  Laterality: N/A;   TONSILLECTOMY     Social History   Socioeconomic History   Marital status: Married    Spouse name: Denman Fischer   Number of children: Not on file   Years of education: Not on file   Highest education level: Not on file  Occupational History   Not on file  Tobacco Use   Smoking status: Never   Smokeless tobacco: Never  Vaping Use   Vaping status: Never Used  Substance and Sexual Activity   Alcohol use: Not Currently    Alcohol/week: 1.0 standard drink of alcohol    Types: 1 Glasses of wine per week   Drug use: Not Currently   Sexual activity: Yes    Birth control/protection: Surgical    Comment: Tubal  Other Topics Concern   Not on file  Social History Narrative   Lives in [place]; [place]. Never smoked; wine rarely. Used in work in [employer]. With husband.    Social Drivers of Corporate investment banker Strain: Low Risk  (11/11/2023)   Received from [facility]   Overall Financial Resource Strain (CARDIA)    Difficulty of Paying Living Expenses: Not hard at all  Food Insecurity: No Food Insecurity (11/11/2023)   Received from [facility]   Hunger Vital Sign    Within the past 12 months, you worried that your food would run out before you got the money to buy more.: Never true    Within the past 12 months, the food you bought just didn't last and you didn't have money to get more.: Never true  Transportation Needs: No Transportation Needs (11/11/2023)   Received  from [facility] Transportation    In the past 12 months, has lack of transportation kept you from medical appointments or from getting medications?: No    Lack of Transportation (Non-Medical): No  Physical Activity: Not on file  Stress: Not on file  Social Connections: Not on file  Intimate Partner Violence: Not on file   Family History  Problem Relation Age of Onset   Breast cancer Mother    Cancer Mother        unknown type    Current Outpatient Medications:    acyclovir  (ZOVIRAX ) 400 MG tablet, Take 400 mg by mouth 2 (two) times daily., Disp: , Rfl:    albuterol  (VENTOLIN  HFA) 108 (90 Base) MCG/ACT inhaler, Inhale 2 puffs into the lungs., Disp: , Rfl:    ALPRAZolam (XANAX) 0.5 MG tablet, Take 0.5 mg by mouth 3 (three) times daily., Disp: , Rfl:    amLODipine  (  NORVASC ) 2.5 MG tablet, Take 2.5 mg by mouth daily., Disp: , Rfl:    cetirizine (ZYRTEC) 10 MG tablet, Take 10 mg by mouth daily as needed for allergies., Disp: , Rfl:    Cholecalciferol 50 MCG (2000 UT) TABS, Take 1 tablet by mouth daily., Disp: , Rfl:    fluticasone  (FLONASE ) 50 MCG/ACT nasal spray, Place 2 sprays into both nostrils daily., Disp: 16 g, Rfl: 0   hydrochlorothiazide (HYDRODIURIL) 25 MG tablet, Take 25 mg by mouth daily., Disp: , Rfl:    methocarbamol  (ROBAXIN ) 500 MG tablet, Take 1 tablet (500 mg total) by mouth every 8 (eight) hours as needed for muscle spasms., Disp: 90 tablet, Rfl: 0   metoprolol  succinate (TOPROL -XL) 25 MG 24 hr tablet, Take 12.5 mg by mouth daily., Disp: , Rfl:    MOUNJARO 2.5 MG/0.5ML Pen, Inject 2.5 mg into the skin., Disp: , Rfl:    naproxen (NAPROSYN) 500 MG tablet, Take 500 mg by mouth 2 (two) times daily as needed., Disp: , Rfl:    omeprazole (PRILOSEC) 20 MG capsule, Take 20 mg by mouth daily., Disp: , Rfl:    traZODone (DESYREL) 100 MG tablet, Take 100 mg by mouth at bedtime., Disp: , Rfl:    triamcinolone (NASACORT) 55 MCG/ACT AERO nasal inhaler,  Place 2 sprays into the nose daily., Disp: , Rfl:    zolpidem  (AMBIEN  CR) 6.25 MG CR tablet, Take 1 tablet (6.25 mg total) by mouth at bedtime as needed. for sleep, Disp: 30 tablet, Rfl: 0  Physical exam:  Vitals:   02/11/24 1443  BP: (!) 126/90  Pulse: 82  Resp: 16  Temp: 98 F (36.7 C)  TempSrc: Tympanic  SpO2: 96%  Weight: 200 lb (90.7 kg)   Physical Exam Vitals reviewed.  Constitutional:      Appearance: She is not ill-appearing.  Chest:  Breasts:    Left: Tenderness present.      Comments: 1. Pinpoint pustular lesion of left inframammary skin fold at 5:00. Tender to palpation. No other masses palpated.   Skin:    Coloration: Skin is not pale.   Neurological:     Mental Status: She is alert.      02/25/23- MM 3D Diagnostic Mammogram Bilateral  FINDINGS: LEFT breast: A metallic BB applied to the skin denotes the patient indicated area of focal pain in the upper outer breast. No underlying mammographic abnormality. No suspicious mass, calcification or other findings in the left breast.   On physical exam, I do not appreciate a suspicious mass in the upper-outer left breast. Tenderness to palpation is noted.   Targeted ultrasound of the left breast of the upper outer left breast, the patient indicated area of pain, was performed with representative images obtained at the 2 o'clock position 5 cm from the nipple. No suspicious solid or cystic mass.   RIGHT breast: No suspicious mass, suspicious calcifications, or other findings in the right breast.   IMPRESSION: 1. No mammographic or sonographic abnormality at the patient indicated area of intermittent pain in the left breast. 2. No mammographic findings of malignancy in either breast.  No results found.  Assessment and plan- Patient is a 68 y.o. female with history of follicular lymphoma who presents to symptom management clinic for complaints of left breast pain  Skin infection- pain localized to 5:00 left  breast in inframammary fold. No mammogram correlate on previous mammogram from July 2024. Enlarging per patient. Suspect forming abscess. Start doxycycline  100 mg BID x 7 days.  If tenderness and lump don't resolve, plan to perform diagnostic mammogram to evaluate further.   Disposition:  F/u as scheduled. RTC if symptoms don't improve or worsen- la    Visit Diagnosis 1. Skin infection    Patient expressed understanding and was in agreement with this plan. She also understands that She can call clinic at any time with any questions, concerns, or complaints.   Thank you for allowing me to participate in the care of this very pleasant patient.   Kenney Peacemaker, DNP, AGNP-C, AOCNP [facility] [phone]  CC: Dr Valentine Gasmen

## 2024-02-11 NOTE — Patient Instructions (Signed)
 I've sent a prescription for antibiotics to your pharmacy. If the tenderness and lump don't resolve in the next week, please let me know so I can schedule you a mammogram sooner. It was a pleasure meeting you today and thank you for allowing me to participate in your care. -Kenney Peacemaker, NP

## 2024-03-30 ENCOUNTER — Ambulatory Visit
Admission: RE | Admit: 2024-03-30 | Discharge: 2024-03-30 | Disposition: A | Discharge status: Home / Self Care | Source: Ambulatory Visit | Attending: Internal Medicine | Admitting: Internal Medicine

## 2024-03-30 DIAGNOSIS — Z1231 Encounter for screening mammogram for malignant neoplasm of breast: Secondary | ICD-10-CM | POA: Insufficient documentation

## 2024-06-11 ENCOUNTER — Ambulatory Visit
Attending: Student in an Organized Health Care Education/Training Program | Admitting: Student in an Organized Health Care Education/Training Program

## 2024-06-11 ENCOUNTER — Encounter: Payer: Self-pay | Admitting: Student in an Organized Health Care Education/Training Program

## 2024-06-11 VITALS — BP 147/91 | HR 79 | Temp 97.5°F | Resp 16 | Ht 61.0 in | Wt 175.0 lb

## 2024-06-11 DIAGNOSIS — G8929 Other chronic pain: Secondary | ICD-10-CM | POA: Diagnosis present

## 2024-06-11 DIAGNOSIS — M5116 Intervertebral disc disorders with radiculopathy, lumbar region: Secondary | ICD-10-CM | POA: Diagnosis present

## 2024-06-11 DIAGNOSIS — M5416 Radiculopathy, lumbar region: Secondary | ICD-10-CM | POA: Insufficient documentation

## 2024-06-11 DIAGNOSIS — M792 Neuralgia and neuritis, unspecified: Secondary | ICD-10-CM | POA: Diagnosis present

## 2024-06-11 DIAGNOSIS — G894 Chronic pain syndrome: Secondary | ICD-10-CM | POA: Diagnosis present

## 2024-06-11 MED ORDER — JOURNAVX 50 MG PO TABS: 50.0000 mg | ORAL_TABLET | Freq: Two times a day (BID) | ORAL | 1 refills | Status: AC | PRN

## 2024-06-11 MED ORDER — BUPRENORPHINE 7.5 MCG/HR TD PTWK: 1.0000 | MEDICATED_PATCH | TRANSDERMAL | 0 refills | Status: DC

## 2024-06-11 MED ORDER — BUPRENORPHINE 5 MCG/HR TD PTWK: 1.0000 | MEDICATED_PATCH | TRANSDERMAL | 0 refills | Status: AC

## 2024-06-11 NOTE — Progress Notes (Signed)
 PROVIDER NOTE: Interpretation of information contained herein should be left to medically-trained personnel. Specific patient instructions are provided elsewhere under Patient Instructions section of medical record. This document was created in part using AI and STT-dictation technology, any transcriptional errors that may result from this process are unintentional.  Patient: Danielle Lewis  Service: E/M   PCP: Harvey Gaetana LITTIE, NP  DOB: [birth year]  DOS: 06/11/2024  Provider: Wallie Sherry, MD  MRN: [id number]  Delivery: Face-to-face  Specialty: Interventional Pain Management  Type: Established Patient  Setting: Ambulatory outpatient facility  Specialty designation: 09  Referring Prov.: Harvey Gaetana LITTIE, NP  Location: Outpatient office facility       History of present illness (HPI) [name], a 68 y.o. year old female, is here today because of her Intractable neuropathic pain of lower extremity, left [M79.2]. Ms. Deitrick primary complain today is Back Pain (Right lower)  Pertinent problems: Ms. Libman has Lumbar spondylosis; Lumbar disc herniation with radiculopathy (L5/S1); Lumbar degenerative disc disease; and Chronic pain syndrome on their pertinent problem list.  Pain Assessment: Severity of Chronic pain is reported as a 6 /10. Location: Back Lower/down right leg to knee; shocking sensation down left leg to knee. Onset: More than a month ago. Quality: Other (Comment), Sharp, Shooting (shocking pain/left leg). Timing: Constant. Modifying factor(s): naproxen. Vitals:  height is 5' 1 (1.549 m) and weight is 175 lb (79.4 kg). Her temperature is 97.5 F (36.4 C) (abnormal). Her blood pressure is 147/91 (abnormal) and her pulse is 79. Her respiration is 16 and oxygen saturation is 99%.  BMI: Estimated body mass index is 33.07 kg/m as calculated from the following:   Height as of this encounter: 5' 1 (1.549 m).   Weight as of this encounter: 175 lb (79.4 kg).  Last encounter: 10/08/2023. Last  procedure: Visit date not found.  Reason for encounter:   History of Present Illness   Danielle Lewis is a 68 year old female with lumbar spondylosis who presents with increased axial low back pain, R>L.  She describes her back pain as having shifted from the left side to the right side, with a sensation akin to 'a knife in there twisting.' The pain is localized on the right side of her back, distinct from the location of her spinal cord stimulator battery, which is on the opposite side.  She has previously undergone facet blocks and nerve blocks at L3, L4, and L5, which provided significant pain relief. However, the spinal cord stimulator has not made a noticeable difference in her pain levels, and she continues to experience persistent pain.  She recalls a recent fall from a foam couch, which may have exacerbated her symptoms. Despite this, she has not noticed any changes in the position of her spinal cord stimulator leads, as they were confirmed to be in the correct area during her last check-up.  We discussed adding a new prescription for a buprenorphine pain patch, starting at 5 micrograms, to be increased to 7.5 micrograms after one month. She is also prescribed a non-opioid pain medication, Jornavax, to be taken twice daily.       Pharmacotherapy Assessment   Start Butrans patch as above Monitoring: [place] PMP: PDMP reviewed during this encounter.       Pharmacotherapy: No side-effects or adverse reactions reported. Compliance: No problems identified. Effectiveness: Clinically acceptable.  Dayna Pulling, RN  06/11/2024  1:59 PM  Sign when Signing Visit Safety precautions to be maintained throughout the outpatient stay will  include: orient to surroundings, keep bed in low position, maintain call bell within reach at all times, provide assistance with transfer out of bed and ambulation.   UDS:  Summary  Date Value Ref Range Status  04/02/2022 Note  Final    Comment:     ==================================================================== Compliance Drug Analysis, Ur ==================================================================== Test                             Result       Flag       Units  Drug Present and Declared for Prescription Verification   Alprazolam                     185          EXPECTED   ng/mg creat   Alpha-hydroxyalprazolam        303          EXPECTED   ng/mg creat    Source of alprazolam is a scheduled prescription medication. Alpha-    hydroxyalprazolam is an expected metabolite of alprazolam.    Gabapentin                     PRESENT      EXPECTED   Zolpidem                        PRESENT      EXPECTED   Zolpidem  Acid                  PRESENT      EXPECTED    Zolpidem  acid is an expected metabolite of zolpidem .    Citalopram                     PRESENT      EXPECTED   Desmethylcitalopram            PRESENT      EXPECTED    Desmethylcitalopram is an expected metabolite of citalopram or the    enantiomeric form, escitalopram.    Trazodone                      PRESENT      EXPECTED   1,3 chlorophenyl piperazine    PRESENT      EXPECTED    1,3-chlorophenyl piperazine is an expected metabolite of trazodone.    Naproxen                       PRESENT      EXPECTED  Drug Present not Declared for Prescription Verification   Tramadol                       >3333        UNEXPECTED ng/mg creat   O-Desmethyltramadol            >3333        UNEXPECTED ng/mg creat   N-Desmethyltramadol            2980         UNEXPECTED ng/mg creat    Source of tramadol is a prescription medication. O-desmethyltramadol    and N-desmethyltramadol are expected metabolites of tramadol.    Cyclobenzaprine                PRESENT      UNEXPECTED  Desmethylcyclobenzaprine       PRESENT      UNEXPECTED    Desmethylcyclobenzaprine is an expected metabolite of    cyclobenzaprine.  ==================================================================== Test                       Result    Flag   Units      Ref Range   Creatinine              150              mg/dL      >=79 ==================================================================== Declared Medications:  The flagging and interpretation on this report are based on the  following declared medications.  Unexpected results may arise from  inaccuracies in the declared medications.   **Note: The testing scope of this panel includes these medications:   Alprazolam (Xanax)  Escitalopram (Lexapro)  Gabapentin (Neurontin)  Naproxen (Naprosyn)  Trazodone (Desyrel)   **Note: The testing scope of this panel does not include small to  moderate amounts of these reported medications:   Zolpidem  (Ambien )   **Note: The testing scope of this panel does not include the  following reported medications:   Albuterol   Amlodipine  (Norvasc )  Cetirizine (Zyrtec)  Omeprazole (Prilosec) ==================================================================== For clinical consultation, please call [phone]. ====================================================================     No results found for: CBDTHCR No results found for: D8THCCBX No results found for: D9THCCBX  ROS  Constitutional: Denies any fever or chills Gastrointestinal: No reported hemesis, hematochezia, vomiting, or acute GI distress Musculoskeletal: As above Neurological: No reported episodes of acute onset apraxia, aphasia, dysarthria, agnosia, amnesia, paralysis, loss of coordination, or loss of consciousness  Medication Review  ALPRAZolam, Cholecalciferol, Suzetrigine, acyclovir , albuterol , amLODipine , buprenorphine, cetirizine, fluticasone , hydrochlorothiazide, methocarbamol , metoprolol  succinate, naproxen, omeprazole, traZODone, triamcinolone, and zolpidem   History Review  Allergy: Ms. Arvanitis is allergic to benadryl  [diphenhydramine ], oxycodone , and penicillins. Drug: Ms. Dimaio  reports that she does not currently use  drugs. Alcohol:  reports that she does not currently use alcohol after a past usage of about 1.0 standard drink of alcohol per week. Tobacco:  reports that she has never smoked. She has never used smokeless tobacco. Social: Ms. Brilliant  reports that she has never smoked. She has never used smokeless tobacco. She reports that she does not currently use alcohol after a past usage of about 1.0 standard drink of alcohol per week. She reports that she does not currently use drugs. Medical:  has a past medical history of Anemia, Anginal pain, Anxiety, Arthritis, Cancer (HCC), COVID-19 (2021), Depression, Dyspnea, GERD (gastroesophageal reflux disease), Headache, Hypertension, Left bundle branch block, Pneumonia, and Stroke (HCC). Surgical: Ms. Koehne  has a past surgical history that includes Cholecystectomy; Appendectomy; Tonsillectomy; Abdominal hysterectomy; Lymph gland excision (Right, 01/22/2020); Portacath placement (N/A, 02/24/2020); Breast biopsy (Right, 04/28/2021); Dilation and curettage of uterus; Excision of breast biopsy (Right, 05/19/2021); Laparoscopic bilateral salpingo oophorectomy (Bilateral, 07/06/2021); cyst removal from head (Right, 09/04/2022); port a cath removed; and Thoracic laminectomy for spinal cord stimulator (N/A, 01/02/2023). Family: family history includes Breast cancer in her mother; Cancer in her mother.  Laboratory Chemistry Profile   Renal Lab Results  Component Value Date   BUN 22 02/03/2024   CREATININE 1.11 (H) 02/03/2024   BCR 15 05/09/2015   GFRAA >60 05/03/2020   GFRNONAA 54 (L) 02/03/2024    Hepatic Lab Results  Component Value Date   AST 47 (H) 02/03/2024   ALT 52 (H) 02/03/2024   ALBUMIN 4.2 02/03/2024  ALKPHOS 110 02/03/2024   HCVAB NON REACTIVE 01/11/2020    Electrolytes Lab Results  Component Value Date   NA 134 (L) 02/03/2024   K 4.0 02/03/2024   CL 101 02/03/2024   CALCIUM 9.2 02/03/2024    Bone No results found for: VD25OH, VD125OH2TOT,  CI6874NY7, CI7874NY7, 25OHVITD1, 25OHVITD2, 25OHVITD3, TESTOFREE, TESTOSTERONE  Inflammation (CRP: Acute Phase) (ESR: Chronic Phase) No results found for: CRP, ESRSEDRATE, LATICACIDVEN       Note: Above Lab results reviewed.  Recent Imaging Review  MM 3D SCREENING MAMMOGRAM BILATERAL BREAST CLINICAL DATA:  Screening.  EXAM: DIGITAL SCREENING BILATERAL MAMMOGRAM WITH TOMOSYNTHESIS AND CAD  TECHNIQUE: Bilateral screening digital craniocaudal and mediolateral oblique mammograms were obtained. Bilateral screening digital breast tomosynthesis was performed. The images were evaluated with computer-aided detection.  COMPARISON:  Previous exam(s).  ACR Breast Density Category a: The breasts are almost entirely fatty.  FINDINGS: There are no findings suspicious for malignancy.  IMPRESSION: No mammographic evidence of malignancy. A result letter of this screening mammogram will be mailed directly to the patient.  RECOMMENDATION: Screening mammogram in one year. (Code:[id number]-B-01Y)  BI-RADS CATEGORY  1: Negative.  Electronically Signed   By: Alm Parkins M.D.   On: 04/01/2024 13:36 Note: Reviewed        Physical Exam  Vitals: BP (!) 147/91   Pulse 79   Temp (!) 97.5 F (36.4 C)   Resp 16   Ht 5' 1 (1.549 m)   Wt 175 lb (79.4 kg)   SpO2 99%   BMI 33.07 kg/m  BMI: Estimated body mass index is 33.07 kg/m as calculated from the following:   Height as of this encounter: 5' 1 (1.549 m).   Weight as of this encounter: 175 lb (79.4 kg). Ideal: Ideal body weight: 47.8 kg (105 lb 6.1 oz) Adjusted ideal body weight: 60.4 kg (133 lb 3.6 oz) General appearance: Well nourished, well developed, and well hydrated. In no apparent acute distress Mental status: Alert, oriented x 3 (person, place, & time)       Respiratory: No evidence of acute respiratory distress Eyes: PERLA  Right low back pain, worse with lumbar extension lateral rotation Assessment    Diagnosis  1. Intractable neuropathic pain of lower extremity, left   2. 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 Lumbar disc herniation with radiculopathy (L5/S1)   4. Chronic radicular lumbar pain   5. Chronic pain syndrome      Updated Problems: No problems updated.  Plan of Care  Problem-specific:  Assessment and Plan    Lumbar facet arthropathy with axial low back pain   She experiences increased axial low back pain due to lumbar posterior arthropathy and lumbar spondylosis, with a twisting knife sensation on the right side. A recent fall was reported. Previous treatments, including peripheral and spinal cord stimulators, were ineffective. The pain is likely musculoskeletal and arthritis-related. Initiate Butrans pain patch at 5 micrograms for the first month, increasing to 7.5 micrograms for the second month. Change the patch every seven days, rotating application sites and avoiding the right side. Prescribe Jornavax, a non-opioid pain medication, to be taken twice daily. Avoid using a heating pad over the Butrans patch to prevent altering medication release. Reassess pain management and medication tolerance in 2 months.      Ms. LARIZA COTHRON has a current medication list which includes the following long-term medication(s): fluticasone , omeprazole, trazodone, triamcinolone, and zolpidem .  Pharmacotherapy (Medications Ordered): Meds ordered this encounter  Medications  buprenorphine (BUTRANS) 5 MCG/HR PTWK    Sig: Place 1 patch onto the skin once a week for 28 days.    Dispense:  4 patch    Refill:  0   buprenorphine (BUTRANS) 7.5 MCG/HR    Sig: Place 1 patch onto the skin once a week for 28 days.    Dispense:  4 patch    Refill:  0   Suzetrigine (JOURNAVX) 50 MG TABS    Sig: Take 50 mg by mouth every 12 (twelve) hours as needed for up to 15 days.    Dispense:  60 tablet    Refill:  1   Orders:  No orders of the defined types were placed in this  encounter.    Left L5/S1 ESI 04/11/22, Left L4 PNS 05/21/22, Right L5 PNS 06/27/22, SCS trial [employer]     Return in about 8 weeks (around 08/06/2024) for Emmy Blanch, MM.    Recent Visits No visits were found meeting these conditions. Showing recent visits within past 90 days and meeting all other requirements Today's Visits Date Type Provider Dept  06/11/24 Office Visit Marcelino Nurse, MD [facility]  Showing today's visits and meeting all other requirements Future Appointments Date Type Provider Dept  08/04/24 Appointment Patel, Seema K, NP [facility]  Showing future appointments within next 90 days and meeting all other requirements  I discussed the assessment and treatment plan with the patient. The patient was provided an opportunity to ask questions and all were answered. The patient agreed with the plan and demonstrated an understanding of the instructions. I personally spent a total of 30 minutes in the care of the patient today including preparing to see the patient, getting/reviewing separately obtained history, performing a medically appropriate exam/evaluation, counseling and educating, placing orders, and documenting clinical information in the EHR.    Note by: Nurse Marcelino, MD (TTS and AI technology used. I apologize for any typographical errors that were not detected and corrected.) Date: 06/11/2024; Time: 3:12 PM

## 2024-06-11 NOTE — Progress Notes (Signed)
 Safety precautions to be maintained throughout the outpatient stay will include: orient to surroundings, keep bed in low position, maintain call bell within reach at all times, provide assistance with transfer out of bed and ambulation.

## 2024-06-11 NOTE — Patient Instructions (Addendum)
 Return for Med refill by 08/06/24

## 2024-08-04 ENCOUNTER — Encounter: Payer: Self-pay | Admitting: Nurse Practitioner

## 2024-08-04 ENCOUNTER — Inpatient Hospital Stay: Admission: RE | Admit: 2024-08-04 | Discharge: 2024-08-04 | Attending: Internal Medicine

## 2024-08-04 ENCOUNTER — Other Ambulatory Visit

## 2024-08-04 ENCOUNTER — Ambulatory Visit: Attending: Nurse Practitioner | Admitting: Nurse Practitioner

## 2024-08-04 VITALS — BP 141/89 | HR 87 | Temp 97.3°F | Resp 16 | Ht 61.0 in | Wt 160.0 lb

## 2024-08-04 DIAGNOSIS — M792 Neuralgia and neuritis, unspecified: Secondary | ICD-10-CM

## 2024-08-04 DIAGNOSIS — M5116 Intervertebral disc disorders with radiculopathy, lumbar region: Secondary | ICD-10-CM

## 2024-08-04 DIAGNOSIS — Z79899 Other long term (current) drug therapy: Secondary | ICD-10-CM | POA: Insufficient documentation

## 2024-08-04 DIAGNOSIS — G8929 Other chronic pain: Secondary | ICD-10-CM

## 2024-08-04 DIAGNOSIS — G894 Chronic pain syndrome: Secondary | ICD-10-CM

## 2024-08-04 DIAGNOSIS — C8218 Follicular lymphoma grade II, lymph nodes of multiple sites: Secondary | ICD-10-CM

## 2024-08-04 MED ORDER — SODIUM CHLORIDE 0.9% FLUSH: 10.0000 mL | INTRAVENOUS | Status: DC | PRN

## 2024-08-04 MED ORDER — JOURNAVX 50 MG PO TABS: 50.0000 mg | ORAL_TABLET | Freq: Two times a day (BID) | ORAL | 1 refills | Status: AC

## 2024-08-04 MED ORDER — BUPRENORPHINE 7.5 MCG/HR TD PTWK: 1.0000 | MEDICATED_PATCH | TRANSDERMAL | 2 refills | Status: AC

## 2024-08-04 MED ORDER — HEPARIN SOD (PORK) LOCK FLUSH 100 UNIT/ML IV SOLN: 500.0000 [IU] | Freq: Once | INTRAVENOUS | Status: DC

## 2024-08-04 MED ORDER — IOPAMIDOL (ISOVUE-300) INJECTION 61%
100.0000 mL | Freq: Once | INTRAVENOUS | Status: AC | PRN
  Administered 2024-08-04: 100 mL via INTRAVENOUS

## 2024-08-04 NOTE — Progress Notes (Signed)
 PROVIDER NOTE: Interpretation of information contained herein should be left to medically-trained personnel. Specific patient instructions are provided elsewhere under Patient Instructions section of medical record. This document was created in part using AI and STT-dictation technology, any transcriptional errors that may result from this process are unintentional.  Patient: Danielle Lewis  Service: E/M   PCP: Harvey Gaetana LITTIE, NP  DOB: [birth year]  DOS: 08/04/2024  Provider: Emmy MARLA Blanch, NP  MRN: [id number]  Delivery: Face-to-face  Specialty: Interventional Pain Management  Type: Established Patient  Setting: Ambulatory outpatient facility  Specialty designation: 09  Referring Prov.: Harvey Gaetana LITTIE, NP  Location: Outpatient office facility       History of present illness (HPI) [name], a 68 y.o. year old female, is here today because of her low back pain (SCS present). Danielle Lewis primary complain today is Back Pain (Lumbar bilateral.  SCS present )  Pertinent problems: Danielle Lewis has Lumbar spondylosis; Lumbar disc herniation with radiculopathy (L5/S1); Lumbar degenerative disc disease; Chronic pain syndrome; Chronic radicular lumbar pain; Intractable neuropathic pain of lower extremity, left; Intractable neuropathic pain of left lower extremity; Chest pain with high risk for cardiac etiology; Depression; Primary hypertension; DDD (degenerative disc disease), lumbar; and Medication management on their pertinent problem list.  Pain Assessment: Severity of Chronic pain is reported as a 0-No pain/10. Location: Back Lower, Right, Left/denies. Onset: More than a month ago. Quality: Other (Comment) (no pain today). Timing: Intermittent. Modifying factor(s): Stimulator, journvax. Vitals:  height is 5' 1 (1.549 m) and weight is 160 lb (72.6 kg). Her temporal temperature is 97.3 F (36.3 C) (abnormal). Her blood pressure is 141/89 (abnormal) and her pulse is 87. Her respiration is 16 and oxygen  saturation is 100%.  BMI: Estimated body mass index is 30.23 kg/m as calculated from the following:   Height as of this encounter: 5' 1 (1.549 m).   Weight as of this encounter: 160 lb (72.6 kg).  Last encounter: Visit date not found. Last procedure: Visit date not found.  Reason for encounter: medication management. No change in medical history since last visit.  Patient's pain is at baseline.  Patient continues multimodal pain regimen as prescribed.  States that it provides pain relief and improvement in functional status.   Discussed the use of AI scribe software for clinical note transcription with the patient, who gave verbal consent to proceed.  History of Present Illness   Danielle Lewis is a 68 year old female who presents for medication management.  She has a spinal cord stimulator implanted by [employer], following a trial conducted by Dr. Marcelino. Since the implantation, she has undergone multiple surgeries, including those related to cancer. She is currently using a Butrans  patch, which was 7.5 mcg per hour. She applies one patch per week and reports no side effects. She is also taking Jourvnavx, 50 mg every 12 hours, and is here for a follow-up regarding this medication. She has a Holter monitor in place for heart rate monitoring, but no further details about her cardiac condition were discussed. The patient reports that she does not usually have a lot of side effects from anything, including Butrans  and Jovanox.     Pharmacotherapy Assessment   Butrans  7.5 mcg/h patch, 1 patch placed onto the skin once a week Jourvnavx, 50 mg every 12 hours, as needed for pain  Monitoring: [place] PMP: PDMP reviewed during this encounter.       Pharmacotherapy: No side-effects or adverse reactions  reported. Compliance: No problems identified. Effectiveness: Clinically acceptable.  Danielle Chrissie MATSU, RN  08/04/2024 11:00 AM  Sign when Signing Visit Safety precautions to be maintained  throughout the outpatient stay will include: orient to surroundings, keep bed in low position, maintain call bell within reach at all times, provide assistance with transfer out of bed and ambulation.     UDS:  Summary  Date Value Ref Range Status  04/02/2022 Note  Final    Comment:    ==================================================================== Compliance Drug Analysis, Ur ==================================================================== Test                             Result       Flag       Units  Drug Present and Declared for Prescription Verification   Alprazolam                     185          EXPECTED   ng/mg creat   Alpha-hydroxyalprazolam        303          EXPECTED   ng/mg creat    Source of alprazolam is a scheduled prescription medication. Alpha-    hydroxyalprazolam is an expected metabolite of alprazolam.    Gabapentin                     PRESENT      EXPECTED   Zolpidem                        PRESENT      EXPECTED   Zolpidem  Acid                  PRESENT      EXPECTED    Zolpidem  acid is an expected metabolite of zolpidem .    Citalopram                     PRESENT      EXPECTED   Desmethylcitalopram            PRESENT      EXPECTED    Desmethylcitalopram is an expected metabolite of citalopram or the    enantiomeric form, escitalopram.    Trazodone                      PRESENT      EXPECTED   1,3 chlorophenyl piperazine    PRESENT      EXPECTED    1,3-chlorophenyl piperazine is an expected metabolite of trazodone.    Naproxen                       PRESENT      EXPECTED  Drug Present not Declared for Prescription Verification   Tramadol                       >3333        UNEXPECTED ng/mg creat   O-Desmethyltramadol            >3333        UNEXPECTED ng/mg creat   N-Desmethyltramadol            2980         UNEXPECTED ng/mg creat    Source of tramadol is a prescription medication. O-desmethyltramadol    and  N-desmethyltramadol are expected metabolites  of tramadol.    Cyclobenzaprine                PRESENT      UNEXPECTED   Desmethylcyclobenzaprine       PRESENT      UNEXPECTED    Desmethylcyclobenzaprine is an expected metabolite of    cyclobenzaprine.  ==================================================================== Test                      Result    Flag   Units      Ref Range   Creatinine              150              mg/dL      >=79 ==================================================================== Declared Medications:  The flagging and interpretation on this report are based on the  following declared medications.  Unexpected results may arise from  inaccuracies in the declared medications.   **Note: The testing scope of this panel includes these medications:   Alprazolam (Xanax)  Escitalopram (Lexapro)  Gabapentin (Neurontin)  Naproxen (Naprosyn)  Trazodone (Desyrel)   **Note: The testing scope of this panel does not include small to  moderate amounts of these reported medications:   Zolpidem  (Ambien )   **Note: The testing scope of this panel does not include the  following reported medications:   Albuterol   Amlodipine  (Norvasc )  Cetirizine (Zyrtec)  Omeprazole (Prilosec) ==================================================================== For clinical consultation, please call [phone]. ====================================================================     No results found for: CBDTHCR No results found for: D8THCCBX No results found for: D9THCCBX  ROS  Constitutional: Denies any fever or chills Gastrointestinal: No reported hemesis, hematochezia, vomiting, or acute GI distress Musculoskeletal: Low back pain Neurological: No reported episodes of acute onset apraxia, aphasia, dysarthria, agnosia, amnesia, paralysis, loss of coordination, or loss of consciousness  Medication Review  ALPRAZolam, Cholecalciferol, Suzetrigine , acyclovir , albuterol , amLODipine , buprenorphine , cetirizine,  fluticasone , hydrochlorothiazide, methocarbamol , metoprolol  succinate, montelukast, naproxen, omeprazole, tirzepatide, traZODone, triamcinolone, and zolpidem   History Review  Allergy: Danielle Lewis is allergic to benadryl  [diphenhydramine ], oxycodone , and penicillins. Drug: Danielle Lewis  reports that she does not currently use drugs. Alcohol:  reports that she does not currently use alcohol after a past usage of about 1.0 standard drink of alcohol per week. Tobacco:  reports that she has never smoked. She has never used smokeless tobacco. Social: Danielle Lewis  reports that she has never smoked. She has never used smokeless tobacco. She reports that she does not currently use alcohol after a past usage of about 1.0 standard drink of alcohol per week. She reports that she does not currently use drugs. Medical:  has a past medical history of Anemia, Anginal pain, Anxiety, Arthritis, Cancer (HCC), COVID-19 (2021), Depression, Dyspnea, GERD (gastroesophageal reflux disease), Headache, Hypertension, Left bundle branch block, Pneumonia, and Stroke (HCC). Surgical: Danielle Lewis  has a past surgical history that includes Cholecystectomy; Appendectomy; Tonsillectomy; Abdominal hysterectomy; Lymph gland excision (Right, 01/22/2020); Portacath placement (N/A, 02/24/2020); Breast biopsy (Right, 04/28/2021); Dilation and curettage of uterus; Excision of breast biopsy (Right, 05/19/2021); Laparoscopic bilateral salpingo oophorectomy (Bilateral, 07/06/2021); cyst removal from head (Right, 09/04/2022); port a cath removed; and Thoracic laminectomy for spinal cord stimulator (N/A, 01/02/2023). Family: family history includes Breast cancer in her mother; Cancer in her mother.  Laboratory Chemistry Profile   Renal Lab Results  Component Value Date   BUN 22 02/03/2024   CREATININE 1.11 (H) 02/03/2024   BCR 15 05/09/2015  GFRAA >60 05/03/2020   GFRNONAA 54 (L) 02/03/2024    Hepatic Lab Results  Component Value Date   AST 47 (H)  02/03/2024   ALT 52 (H) 02/03/2024   ALBUMIN 4.2 02/03/2024   ALKPHOS 110 02/03/2024   HCVAB NON REACTIVE 01/11/2020    Electrolytes Lab Results  Component Value Date   NA 134 (L) 02/03/2024   K 4.0 02/03/2024   CL 101 02/03/2024   CALCIUM 9.2 02/03/2024    Bone No results found for: VD25OH, VD125OH2TOT, CI6874NY7, CI7874NY7, 25OHVITD1, 25OHVITD2, 25OHVITD3, TESTOFREE, TESTOSTERONE  Inflammation (CRP: Acute Phase) (ESR: Chronic Phase) No results found for: CRP, ESRSEDRATE, LATICACIDVEN       Note: Above Lab results reviewed.  Recent Imaging Review  MM 3D SCREENING MAMMOGRAM BILATERAL BREAST CLINICAL DATA:  Screening.  EXAM: DIGITAL SCREENING BILATERAL MAMMOGRAM WITH TOMOSYNTHESIS AND CAD  TECHNIQUE: Bilateral screening digital craniocaudal and mediolateral oblique mammograms were obtained. Bilateral screening digital breast tomosynthesis was performed. The images were evaluated with computer-aided detection.  COMPARISON:  Previous exam(s).  ACR Breast Density Category a: The breasts are almost entirely fatty.  FINDINGS: There are no findings suspicious for malignancy.  IMPRESSION: No mammographic evidence of malignancy. A result letter of this screening mammogram will be mailed directly to the patient.  RECOMMENDATION: Screening mammogram in one year. (Code:[id number]-B-01Y)  BI-RADS CATEGORY  1: Negative.  Electronically Signed   By: Alm Parkins M.D.   On: 04/01/2024 13:36 Note: Reviewed        Physical Exam  Vitals: BP (!) 141/89 (BP Location: Left Arm, Patient Position: Sitting, Cuff Size: Normal)   Pulse 87   Temp (!) 97.3 F (36.3 C) (Temporal)   Resp 16   Ht 5' 1 (1.549 m)   Wt 160 lb (72.6 kg)   SpO2 100%   BMI 30.23 kg/m  BMI: Estimated body mass index is 30.23 kg/m as calculated from the following:   Height as of this encounter: 5' 1 (1.549 m).   Weight as of this encounter: 160 lb (72.6 kg). Ideal: Ideal body weight:  47.8 kg (105 lb 6.1 oz) Adjusted ideal body weight: 57.7 kg (127 lb 3.7 oz) General appearance: Well nourished, well developed, and well hydrated. In no apparent acute distress Mental status: Alert, oriented x 3 (person, place, & time)       Respiratory: No evidence of acute respiratory distress Eyes: PERLA  Musculoskeletal: +LBP  Assessment   Diagnosis Status  1. Medication management   2. Chronic radicular lumbar pain   3. Chronic pain syndrome   4. Intractable neuropathic pain of lower extremity, left   5. Intractable neuropathic pain of right lower extremity   6. Lumbar disc herniation with radiculopathy (L5/S1)    Controlled Controlled Controlled   Updated Problems: Problem  Medication Management    Plan of Care  Problem-specific:  Assessment and Plan    Intractable neuropathic pain of bilateral lower extremities Chronic neuropathic pain managed with spinal cord stimulator and medications. Jourvnavx, and Butrans  effective without significant side effects. - Prescribed Jourvnavx, with one refill. - Prescribed Butrans  patch at 7.5 mg for three months. - Sent prescriptions to [facility] pharmacy in [place].  Chronic pain syndrome Managed with spinal cord stimulator and medications. Current regimen effective with no side effects. - Continue current pain management regimen. - Scheduled follow-up appointment in three months.   Medication management: Patient's pain is controlled with Butrans  patch and Jourvnavx, will continue on current medication regimen.  Prescribing drug monitoring (PDMP) reviewed,  findings consistent with the use of prescribed medication and no evidence of narcotic misuse or abuse.  Urine drug screening (UDS) up to date.  No side effects or adverse reaction reported to medication.  Schedule follow-up in 90 days for medication management.     Danielle Lewis has a current medication list which includes the following long-term medication(s): fluticasone ,  montelukast, omeprazole, trazodone, triamcinolone, and zolpidem .  Pharmacotherapy (Medications Ordered): Meds ordered this encounter  Medications   buprenorphine  (BUTRANS ) 7.5 MCG/HR    Sig: Place 1 patch onto the skin once a week.    Dispense:  4 patch    Refill:  2   JOURNAVX  50 MG TABS    Sig: Take 50 mg by mouth every 12 (twelve) hours.    Dispense:  60 tablet    Refill:  1   Orders:  No orders of the defined types were placed in this encounter.       Return in about 3 months (around 11/02/2024) for (F2F), (MM), Emmy Blanch NP.    Recent Visits Date Type Provider Dept  06/11/24 Office Visit Marcelino Nurse, MD [facility]  Showing recent visits within past 90 days and meeting all other requirements Today's Visits Date Type Provider Dept  08/04/24 Office Visit Ayari Liwanag K, NP [facility]  Showing today's visits and meeting all other requirements Future Appointments Date Type Provider Dept  10/29/24 Appointment Stephan Draughn K, NP [facility]  Showing future appointments within next 90 days and meeting all other requirements  I discussed the assessment and treatment plan with the patient. The patient was provided an opportunity to ask questions and all were answered. The patient agreed with the plan and demonstrated an understanding of the instructions.  Patient advised to call back or seek an in-person evaluation if the symptoms or condition worsens.  I personally spent a total of 30 minutes in the care of the patient today including preparing to see the patient, getting/reviewing separately obtained history, performing a medically appropriate exam/evaluation, counseling and educating, placing orders, referring and communicating with other health care professionals, documenting clinical information in the EHR, independently interpreting results, communicating results, and coordinating care.   Note by: Flecia Shutter K Shanavia Makela, NP (TTS and AI technology  used. I apologize for any typographical errors that were not detected and corrected.) Date: 08/04/2024; Time: 11:50 AM

## 2024-08-04 NOTE — Progress Notes (Signed)
 Safety precautions to be maintained throughout the outpatient stay will include: orient to surroundings, keep bed in low position, maintain call bell within reach at all times, provide assistance with transfer out of bed and ambulation.

## 2024-08-06 ENCOUNTER — Telehealth: Payer: Self-pay | Admitting: Student in an Organized Health Care Education/Training Program

## 2024-08-06 NOTE — Telephone Encounter (Signed)
 PA completed via CMM.  LP

## 2024-08-06 NOTE — Telephone Encounter (Signed)
 PT stated that she needs prior Auth for Journavx . Pt also stated that she did not have to pay in the past,but now the pharmacy are asking for payment.

## 2024-08-13 ENCOUNTER — Inpatient Hospital Stay: Attending: Internal Medicine

## 2024-08-13 ENCOUNTER — Other Ambulatory Visit: Payer: Self-pay

## 2024-08-13 ENCOUNTER — Ambulatory Visit: Admitting: Internal Medicine

## 2024-08-13 VITALS — BP 134/90 | HR 81 | Temp 97.1°F | Resp 16 | Ht 61.0 in | Wt 170.2 lb

## 2024-08-13 DIAGNOSIS — K1230 Oral mucositis (ulcerative), unspecified: Secondary | ICD-10-CM | POA: Insufficient documentation

## 2024-08-13 DIAGNOSIS — N183 Chronic kidney disease, stage 3 unspecified: Secondary | ICD-10-CM | POA: Diagnosis not present

## 2024-08-13 DIAGNOSIS — R7401 Elevation of levels of liver transaminase levels: Secondary | ICD-10-CM | POA: Insufficient documentation

## 2024-08-13 DIAGNOSIS — K121 Other forms of stomatitis: Secondary | ICD-10-CM | POA: Diagnosis not present

## 2024-08-13 DIAGNOSIS — C8218 Follicular lymphoma grade II, lymph nodes of multiple sites: Secondary | ICD-10-CM

## 2024-08-13 DIAGNOSIS — E1122 Type 2 diabetes mellitus with diabetic chronic kidney disease: Secondary | ICD-10-CM | POA: Diagnosis not present

## 2024-08-13 DIAGNOSIS — C821A Follicular lymphoma grade II, in remission: Secondary | ICD-10-CM | POA: Insufficient documentation

## 2024-08-13 DIAGNOSIS — Z803 Family history of malignant neoplasm of breast: Secondary | ICD-10-CM | POA: Insufficient documentation

## 2024-08-13 DIAGNOSIS — Z9221 Personal history of antineoplastic chemotherapy: Secondary | ICD-10-CM | POA: Diagnosis not present

## 2024-08-13 LAB — CMP (CANCER CENTER ONLY)
ALT: 18 U/L (ref 0–44)
AST: 25 U/L (ref 15–41)
Albumin: 4.1 g/dL (ref 3.5–5.0)
Alkaline Phosphatase: 91 U/L (ref 38–126)
Anion gap: 10 (ref 5–15)
BUN: 13 mg/dL (ref 8–23)
CO2: 22 mmol/L (ref 22–32)
Calcium: 9.5 mg/dL (ref 8.9–10.3)
Chloride: 107 mmol/L (ref 98–111)
Creatinine: 1.31 mg/dL — ABNORMAL HIGH (ref 0.44–1.00)
GFR, Estimated: 44 mL/min — ABNORMAL LOW (ref >60)
Glucose, Bld: 85 mg/dL (ref 70–99)
Potassium: 4 mmol/L (ref 3.5–5.1)
Sodium: 139 mmol/L (ref 135–145)
Total Bilirubin: 0.5 mg/dL (ref 0.0–1.2)
Total Protein: 6.7 g/dL (ref 6.5–8.1)

## 2024-08-13 LAB — CBC WITH DIFFERENTIAL (CANCER CENTER ONLY)
Abs Immature Granulocytes: 0.01 K/uL (ref 0.00–0.07)
Basophils Absolute: 0 K/uL (ref 0.0–0.1)
Basophils Relative: 1 %
Eosinophils Absolute: 0.2 K/uL (ref 0.0–0.5)
Eosinophils Relative: 3 %
HCT: 43.4 % (ref 36.0–46.0)
Hemoglobin: 14.9 g/dL (ref 12.0–15.0)
Immature Granulocytes: 0 %
Lymphocytes Relative: 36 %
Lymphs Abs: 2.3 K/uL (ref 0.7–4.0)
MCH: 31.4 pg (ref 26.0–34.0)
MCHC: 34.3 g/dL (ref 30.0–36.0)
MCV: 91.6 fL (ref 80.0–100.0)
Monocytes Absolute: 0.5 K/uL (ref 0.1–1.0)
Monocytes Relative: 8 %
Neutro Abs: 3.3 K/uL (ref 1.7–7.7)
Neutrophils Relative %: 52 %
Platelet Count: 190 K/uL (ref 150–400)
RBC: 4.74 MIL/uL (ref 3.87–5.11)
RDW: 13.5 % (ref 11.5–15.5)
WBC Count: 6.3 K/uL (ref 4.0–10.5)
nRBC: 0 % (ref 0.0–0.2)

## 2024-08-13 LAB — LACTATE DEHYDROGENASE: LDH: 179 U/L (ref 105–235)

## 2024-08-13 MED ORDER — STERILE WATER FOR INJECTION IJ SOLN
5.0000 mL | Freq: Four times a day (QID) | OROMUCOSAL | 1 refills | Status: AC | PRN
  Filled 2024-08-13: qty 480, 12d supply, fill #0

## 2024-08-13 NOTE — Progress Notes (Signed)
  [facility] CONSULT NOTE  Patient Care Team: Harvey Gaetana CROME, NP as PCP - General (Family Medicine) Rodolph Romano, MD as Consulting Physician (General Surgery) Rennie Cindy SAUNDERS, MD as Consulting Physician (Oncology)  CHIEF COMPLAINTS/PURPOSE OF CONSULTATION:   Oncology History Overview Note  # MAY 2021- Bilateral neck adenopathy right more than left; highly concerning for lymphoproliferative disorder.  Excisional biopsy right neck- [Dr.Cintron];  A. 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   B. 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   # MAY 2021-CBC/CMP normal /  mildly elevated alkaline phosphatase. [PCP; [facility]].  # wynetta, 2021- Benda-Rituxa  # SURVIVORSHIP:   # GENETICS:   DIAGNOSIS: Follicle lymphoma  STAGE: III        ;  GOALS: Control  CURRENT/MOST RECENT THERAPY : Bendamustine  Rituxan  [C]     Follicular lymphoma grade ii, lymph nodes of multiple sites (HCC)  01/29/2020 Initial Diagnosis   Follicular lymphoma grade ii, lymph nodes of multiple sites (HCC)   03/02/2020 - 08/25/2020 Chemotherapy   Patient is on Treatment Plan : NON-HODGKINS LYMPHOMA Rituximab  D1 / Bendamustine  D1,2 q28d      HISTORY OF PRESENTING ILLNESS: Alone.  Ambulating dependently.  Danielle Lewis 68 y.o.  female  follicular lymphoma grade 1-2 currently s/p bendamustine  Rituxan -currently on surveillance is here for follow-up/and review results of the CT scan.  Discussed the use of AI scribe software for clinical note transcription with the patient, who gave verbal consent to proceed.  History of Present Illness   Danielle Lewis is a 68 year old female with follicular lymphoma in remission who presents for routine hematology/oncology follow-up.  She remains in remission from follicular lymphoma following completion of chemotherapy nearly four years ago. The most  recent CT scan was reviewed, and the patient was informed that there were no new or concerning findings in the thyroid, esophagus, lungs, or lymph nodes.  She describes persistent oral mucosal ulceration, characterized by fever blisters and soreness primarily along the gum lines, ongoing for several months. The current episode began this morning, but oral discomfort has been present for a couple of months. Dental evaluation and x-rays identified a need for deep cleaning and a tooth filling, but these dental issues predated the onset of oral symptoms. She has not received specific treatment for the oral lesions.  She reports progressive alopecia over the past seven to eight months, with worsening hair loss described as losing clumps and brushes full of hair. The onset of alopecia preceded initiation of Mounjaro and occurred well after completion of chemotherapy. She has not consulted her primary care physician or a dermatologist regarding this issue due to scheduling constraints.  She has experienced significant weight loss of approximately 45 pounds since starting Mounjaro, attributed to reduced food intake and decreased snacking. Blood glucose levels have improved substantially, with recent values ranging from 390 down to 85. She eats very little and denies frequent snacking.      Review of Systems  Constitutional:  Positive for diaphoresis and malaise/fatigue. Negative for chills, fever and weight loss.  HENT:  Negative for nosebleeds and sore throat.   Eyes:  Negative for double vision.  Respiratory:  Negative for cough, hemoptysis, sputum production, shortness of breath and wheezing.   Cardiovascular:  Negative for chest pain, palpitations, orthopnea and leg swelling.  Gastrointestinal:  Negative for abdominal pain, blood in stool, constipation, diarrhea, heartburn,  melena, nausea and vomiting.  Genitourinary:  Negative for dysuria, frequency and urgency.  Musculoskeletal:  Positive for back  pain and joint pain.  Skin: Negative.  Negative for itching and rash.  Neurological:  Negative for dizziness, tingling, focal weakness, weakness and headaches.  Endo/Heme/Allergies:  Does not bruise/bleed easily.  Psychiatric/Behavioral:  Negative for depression. The patient has insomnia. The patient is not nervous/anxious.      MEDICAL HISTORY:  Past Medical History:  Diagnosis Date   Anemia    Anginal pain    Anxiety    Arthritis    Cancer (HCC)    COVID-19 2021   Depression    Dyspnea    GERD (gastroesophageal reflux disease)    Headache    Hypertension    Left bundle branch block    Pneumonia    Stroke [facility])     SURGICAL HISTORY: Past Surgical History:  Procedure Laterality Date   ABDOMINAL HYSTERECTOMY     APPENDECTOMY     BREAST BIOPSY Right 04/28/2021   U/s bx 7:00/2cmfnheart marker-INTRADUCTAL PAPILLOMA WITH APOCRINE METAPLASIA   CHOLECYSTECTOMY     28years ago   cyst removal from head Right 09/04/2022   DILATION AND CURETTAGE OF UTERUS     EXCISION OF BREAST BIOPSY Right 05/19/2021   Procedure: EXCISION OF BREAST BIOPSY w/ Radio frequency tag;  Surgeon: Rodolph Romano, MD;  Location: [facility] ORS;  Service: General;  Laterality: Right;   LAPAROSCOPIC BILATERAL SALPINGO OOPHERECTOMY Bilateral 07/06/2021   Procedure: LAPAROSCOPIC BILATERAL SALPINGO OOPHORECTOMY WITH CYSTOSCOPY;  Surgeon: Lake Read, MD;  Location: [facility] OR[facility];  Service: Gynecology;  Laterality: Bilateral;   LYMPH GLAND EXCISION Right 01/22/2020   Procedure: CERVICAL LYMPH GLAND EXCISION;  Surgeon: Rodolph Romano, MD;  Location: [facility] OR[facility];  Service: General;  Laterality: Right;   port a cath removed     PORTACATH PLACEMENT N/A 02/24/2020   Procedure: INSERTION PORT-A-CATH;  Surgeon: Rodolph Romano, MD;  Location: [facility] ORS;  Service: General;  Laterality: N/A;   THORACIC LAMINECTOMY FOR SPINAL CORD STIMULATOR N/A 01/02/2023   Procedure: THORACIC LAMINECTOMY FOR SPINAL CORD  STIMULATOR PLACEMENT ([employer]);  Surgeon: Clois Fret, MD;  Location: [facility] OR[facility];  Service: Neurosurgery;  Laterality: N/A;   TONSILLECTOMY      SOCIAL HISTORY: Social History   Socioeconomic History   Marital status: Married    Spouse name: Clem   Number of children: Not on file   Years of education: Not on file   Highest education level: Not on file  Occupational History   Not on file  Tobacco Use   Smoking status: Never   Smokeless tobacco: Never  Vaping Use   Vaping status: Never Used  Substance and Sexual Activity   Alcohol use: Not Currently    Alcohol/week: 1.0 standard drink of alcohol    Types: 1 Glasses of wine per week   Drug use: Not Currently   Sexual activity: Yes    Birth control/protection: Surgical    Comment: Tubal  Other Topics Concern   Not on file  Social History Narrative   Lives in [place]; [place]. Never smoked; wine rarely. Used in work in [employer]. With husband.    Social Drivers of Health   Tobacco Use: Low Risk (08/13/2024)   Patient History    Smoking Tobacco Use: Never    Smokeless Tobacco Use: Never    Passive Exposure: Not on file  Financial Resource Strain: Low Risk  (07/13/2024)   [facility]  Overall Financial Resource Strain (CARDIA)    Difficulty of Paying Living Expenses: Not hard at all  Food Insecurity: No Food Insecurity (07/13/2024)   Received from [facility]   Epic    Within the past 12 months, you worried that your food would run out before you got the money to buy more.: Never true    Within the past 12 months, the food you bought just didn't last and you didn't have money to get more.: Never true  Transportation Needs: No Transportation Needs (07/13/2024)   Received from [facility] - Transportation    In the past 12 months, has lack of transportation kept you from medical appointments or from getting medications?: No     Lack of Transportation (Non-Medical): No  Physical Activity: Not on file  Stress: Not on file  Social Connections: Not on file  Intimate Partner Violence: Not on file  Depression (PHQ2-9): High Risk (08/13/2024)   Depression (PHQ2-9)    PHQ-2 Score: 15  Alcohol Screen: Not on file  Housing: Low Risk  (07/13/2024)   Received from [facility]   Epic    In the last 12 months, was there a time when you were not able to pay the mortgage or rent on time?: No    In the past 12 months, how many times have you moved where you were living?: 0    At any time in the past 12 months, were you homeless or living in a shelter (including now)?: No  Utilities: Not At Risk (07/13/2024)   Received from [facility]   Epic    In the past 12 months has the electric, gas, oil, or water  company threatened to shut off services in your home?: No  Health Literacy: Not on file    FAMILY HISTORY: Family History  Problem Relation Age of Onset   Breast cancer Mother    Cancer Mother        unknown type    ALLERGIES:  is allergic to benadryl  [diphenhydramine ], oxycodone , and penicillins.  MEDICATIONS:  Current Outpatient Medications  Medication Sig Dispense Refill   albuterol  (VENTOLIN  HFA) 108 (90 Base) MCG/ACT inhaler Inhale 2 puffs into the lungs.     ALPRAZolam (XANAX) 0.5 MG tablet Take 0.5 mg by mouth 3 (three) times daily.     buprenorphine  (BUTRANS ) 7.5 MCG/HR Place 1 patch onto the skin once a week. 4 patch 2   cetirizine (ZYRTEC) 10 MG tablet Take 10 mg by mouth daily as needed for allergies.     fluticasone  (FLONASE ) 50 MCG/ACT nasal spray Place 2 sprays into both nostrils daily. 16 g 0   montelukast (SINGULAIR) 10 MG tablet Take 10 mg by mouth at bedtime.     MOUNJARO 7.5 MG/0.5ML Pen Inject 7.5 mg into the skin once a week.     naproxen (NAPROSYN) 500 MG tablet Take 500 mg by mouth 2 (two) times daily as needed.     omeprazole (PRILOSEC) 20 MG capsule  Take 20 mg by mouth daily.     traZODone (DESYREL) 100 MG tablet Take 100 mg by mouth at bedtime.     triamcinolone (NASACORT) 55 MCG/ACT AERO nasal inhaler Place 2 sprays into the nose daily.     zolpidem  (AMBIEN  CR) 6.25 MG CR tablet Take 1 tablet (6.25 mg total) by mouth at bedtime as needed. for sleep 30 tablet 0   JOURNAVX  50 MG TABS Take 50 mg  by mouth every 12 (twelve) hours. (Patient not taking: Reported on 08/13/2024) 60 tablet 1   magic mouthwash (multi-ingredient) oral suspension Swish and spit 5-10 mLs 4 (four) times daily as needed. 480 mL 1   No current facility-administered medications for this visit.      SABRA  PHYSICAL EXAMINATION: ECOG PERFORMANCE STATUS: 0 - Asymptomatic  Vitals:   08/13/24 1032  BP: (!) 134/90  Pulse: 81  Resp: 16  Temp: (!) 97.1 F (36.2 C)  SpO2: 99%    Filed Weights   08/13/24 1032  Weight: 170 lb 3.2 oz (77.2 kg)     Physical Exam HENT:     Head: Normocephalic and atraumatic.     Mouth/Throat:     Pharynx: No oropharyngeal exudate.  Eyes:     Pupils: Pupils are equal, round, and reactive to light.  Neck:     Comments: Neck lymphadenopathy improved. Cardiovascular:     Rate and Rhythm: Normal rate and regular rhythm.  Pulmonary:     Effort: Pulmonary effort is normal. No respiratory distress.     Breath sounds: Normal breath sounds. No wheezing.  Abdominal:     General: Bowel sounds are normal. There is no distension.     Palpations: Abdomen is soft. There is no mass.     Tenderness: There is no abdominal tenderness. There is no guarding or rebound.  Musculoskeletal:        General: No tenderness. Normal range of motion.     Cervical back: Normal range of motion and neck supple.  Skin:    General: Skin is warm.  Neurological:     Mental Status: She is alert and oriented to person, place, and time.  Psychiatric:        Mood and Affect: Affect normal.      LABORATORY DATA:  I have reviewed the data as listed Lab  Results  Component Value Date   WBC 6.3 08/13/2024   HGB 14.9 08/13/2024   HCT 43.4 08/13/2024   MCV 91.6 08/13/2024   PLT 190 08/13/2024   Recent Labs    02/03/24 1233 08/13/24 1014  NA 134* 139  K 4.0 4.0  CL 101 107  CO2 24 22  GLUCOSE 390* 85  BUN 22 13  CREATININE 1.11* 1.31*  CALCIUM 9.2 9.5  GFRNONAA 54* 44*  PROT 7.2 6.7  ALBUMIN 4.2 4.1  AST 47* 25  ALT 52* 18  ALKPHOS 110 91  BILITOT 0.8 0.5    RADIOGRAPHIC STUDIES: I have personally reviewed the radiological images as listed and agreed with the findings in the report. CT CHEST ABDOMEN PELVIS W CONTRAST Result Date: 08/04/2024 CLINICAL DATA:  follicular lymphoma.  * Tracking Code: BO * EXAM: CT CHEST, ABDOMEN, AND PELVIS WITH CONTRAST TECHNIQUE: Multidetector CT imaging of the chest, abdomen and pelvis was performed following the standard protocol during bolus administration of intravenous contrast. RADIATION DOSE REDUCTION: This exam was performed according to the departmental dose-optimization program which includes automated exposure control, adjustment of the mA and/or kV according to patient size and/or use of iterative reconstruction technique. CONTRAST:  100mL ISOVUE -300 IOPAMIDOL  (ISOVUE -300) INJECTION 61% COMPARISON:  CT scan chest, abdomen and pelvis from 07/29/2023. FINDINGS: CT CHEST FINDINGS Cardiovascular: Normal cardiac size. No pericardial effusion. No aortic aneurysm. Mediastinum/Nodes: Visualized thyroid gland appears grossly unremarkable. No solid / cystic mediastinal masses. The esophagus is nondistended precluding optimal assessment. No axillary, mediastinal or hilar lymphadenopathy by size criteria. Lungs/Pleura: The central tracheo-bronchial tree is patent. There  are dependent changes in bilateral lungs. No mass or consolidation. No pleural effusion or pneumothorax. No suspicious lung nodules. Musculoskeletal: The visualized soft tissues of the chest wall are grossly unremarkable. Redemonstration of  neurostimulator device along the posterior aspect of the spinal canal at T8-9 level. No suspicious osseous lesions. There are mild multilevel degenerative changes in the visualized spine. CT ABDOMEN PELVIS FINDINGS Hepatobiliary: The liver is normal in size. Non-cirrhotic configuration. No suspicious mass. These is mild diffuse hepatic steatosis. No intrahepatic bile duct dilation. There is mild prominence of the extrahepatic bile duct, most likely due to post cholecystectomy status. Gallbladder is surgically absent. Pancreas: Unremarkable. No pancreatic ductal dilatation or surrounding inflammatory changes. Spleen: Within normal limits. No focal lesion. Adrenals/Urinary Tract: Adrenal glands are unremarkable. No suspicious renal mass. 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 There are several subcentimeter sized hypoattenuating foci in bilateral kidneys, which are too small to adequately characterize. Urinary bladder is under distended, precluding optimal assessment. However, no large mass or stones identified. No perivesical fat stranding. Stomach/Bowel: No disproportionate dilation of the small or large bowel loops. No evidence of abnormal bowel wall thickening or inflammatory changes. The appendix was not visualized; however there is no acute inflammatory process in the right lower quadrant. There are multiple diverticula mainly in the sigmoid colon, without imaging signs of diverticulitis. Vascular/Lymphatic: No ascites or pneumoperitoneum. No abdominal or pelvic lymphadenopathy, by size criteria. Stable few prominent retroperitoneal lymph nodes, when compared to the prior exam. No aneurysmal dilation of the major abdominal arteries. Redemonstration of mistiness in the mesentery in the mid abdomen which contains multiple soft tissue nodules with spared fat halo. This is nonspecific but in appropriate clinical setting can be seen with sclerosing mesenteritis /  mesenteric panniculitis. Reproductive: The uterus is surgically absent. No large adnexal mass. Other: There is a tiny fat containing umbilical hernia. The soft tissues and abdominal wall are otherwise unremarkable. Musculoskeletal: No suspicious osseous lesions. There are mild multilevel degenerative changes in the visualized spine. IMPRESSION: 1. No lymphadenopathy by size criteria within the chest, abdomen or pelvis. 2. Multiple other nonacute observations, as described above. Aortic Atherosclerosis (ICD10-I70.0). Electronically Signed   By: Ree Molt M.D.   On: 08/04/2024 12:21    ASSESSMENT & PLAN:   Follicular lymphoma grade ii, lymph nodes of multiple sites (HCC) # Follicular lymphoma grade 1-2; at least stage III; s/p 6 cycles of Bendamustine  Rituxan .  -CT scan neck chest abdomen pelvis-  dec 2025. No lymphadenopathy by size criteria within the chest, abdomen or pelvis. Continue surveillance with imaging in 12 months- clinically follow up in 6 months.    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  Recommend weight loss. Defer further evaluation with PCP.   # ? Mucositis- recommend magic mouth wash-   # DM- on Munjauro- improved- B -85- improved.   # stage III CKD- stable.   # port/IV access- explanted.   # DISPOSITION:  # follow up in 6 months;MD; labs- cbc/cmp/LDH; - Dr.B  # I reviewed the blood work- with the patient in detail; also reviewed the imaging independently [as summarized above]; and with the patient in detail.    Cc; Dr.Linthavong/Ms. fields NP.     All questions were answered. The patient knows to call the clinic with any problems, questions or concerns.    Cindy JONELLE Joe, MD 08/13/2024 12:33 PM

## 2024-08-13 NOTE — Progress Notes (Signed)
 CT CAP 08/04/24.  Pt asking if you can send in a rx for acyclovir  for fever blisters prn?

## 2024-08-13 NOTE — Assessment & Plan Note (Addendum)
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  -CT scan neck chest abdomen pelvis-  dec 2025. No lymphadenopathy by size criteria within the chest, abdomen or pelvis. Continue surveillance with imaging in 12 months- clinically follow up in 6 months.    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  Recommend weight loss. Defer further evaluation with PCP.   # ? Mucositis- recommend magic mouth wash-   # DM- on Munjauro- improved- B -85- improved.   # stage III CKD- stable.   # port/IV access- explanted.   # DISPOSITION:  # follow up in 6 months;MD; labs- cbc/cmp/LDH; - Dr.B  # I reviewed the blood work- with the patient in detail; also reviewed the imaging independently [as summarized above]; and with the patient in detail.    Cc; Dr.Linthavong/Ms. fields NP.

## 2024-08-14 ENCOUNTER — Other Ambulatory Visit: Payer: Self-pay

## 2024-08-17 ENCOUNTER — Other Ambulatory Visit

## 2024-08-17 ENCOUNTER — Ambulatory Visit: Admitting: Internal Medicine

## 2024-08-24 ENCOUNTER — Other Ambulatory Visit: Payer: Self-pay

## 2024-09-23 ENCOUNTER — Telehealth: Payer: Self-pay | Admitting: Student in an Organized Health Care Education/Training Program

## 2024-09-23 NOTE — Telephone Encounter (Signed)
 Returned patient phone call to verify that we are discussing battery for spinal cord stimulator, voicemail left... I have found in the chart that in fact patient has SCS and I let her know that I would send to my manager to see how we could proceed with this request

## 2024-09-23 NOTE — Telephone Encounter (Signed)
 Pt stated that she needs another battery the insurance will not pay for her replacement battery. The battery is over $600 she will need it taken out of she cannot get a battery

## 2024-09-30 NOTE — Telephone Encounter (Signed)
 I called the patient to schedule an eval appt with Dr. Marcelino to document the need for a scs battery replacement. She said she is waiting on new insurance and will call me once she gets it.

## 2024-10-29 ENCOUNTER — Encounter: Admitting: Nurse Practitioner

## 2025-02-12 ENCOUNTER — Inpatient Hospital Stay: Admitting: Internal Medicine

## 2025-02-12 ENCOUNTER — Inpatient Hospital Stay
